# Patient Record
Sex: Female | Born: 1965 | Race: Black or African American | Hispanic: No | Smoking: Current some day smoker
Health system: Southern US, Community
[De-identification: ages and names within clinical notes are randomized; demographics above are authoritative.]

## PROBLEM LIST (undated history)

## (undated) DIAGNOSIS — K589 Irritable bowel syndrome without diarrhea: Secondary | ICD-10-CM

## (undated) DIAGNOSIS — G43909 Migraine, unspecified, not intractable, without status migrainosus: Secondary | ICD-10-CM

## (undated) DIAGNOSIS — F419 Anxiety disorder, unspecified: Secondary | ICD-10-CM

## (undated) DIAGNOSIS — G4730 Sleep apnea, unspecified: Secondary | ICD-10-CM

## (undated) DIAGNOSIS — K449 Diaphragmatic hernia without obstruction or gangrene: Secondary | ICD-10-CM

## (undated) DIAGNOSIS — R7303 Prediabetes: Secondary | ICD-10-CM

## (undated) DIAGNOSIS — K760 Fatty (change of) liver, not elsewhere classified: Secondary | ICD-10-CM

## (undated) DIAGNOSIS — R42 Dizziness and giddiness: Secondary | ICD-10-CM

## (undated) DIAGNOSIS — K219 Gastro-esophageal reflux disease without esophagitis: Secondary | ICD-10-CM

## (undated) DIAGNOSIS — M1990 Unspecified osteoarthritis, unspecified site: Secondary | ICD-10-CM

## (undated) DIAGNOSIS — J45909 Unspecified asthma, uncomplicated: Secondary | ICD-10-CM

## (undated) DIAGNOSIS — Z720 Tobacco use: Secondary | ICD-10-CM

## (undated) DIAGNOSIS — F329 Major depressive disorder, single episode, unspecified: Secondary | ICD-10-CM

## (undated) DIAGNOSIS — T7840XA Allergy, unspecified, initial encounter: Secondary | ICD-10-CM

## (undated) DIAGNOSIS — F32A Depression, unspecified: Secondary | ICD-10-CM

## (undated) DIAGNOSIS — E7800 Pure hypercholesterolemia, unspecified: Secondary | ICD-10-CM

## (undated) DIAGNOSIS — D219 Benign neoplasm of connective and other soft tissue, unspecified: Secondary | ICD-10-CM

## (undated) DIAGNOSIS — I10 Essential (primary) hypertension: Secondary | ICD-10-CM

## (undated) HISTORY — DX: Depression, unspecified: F32.A

## (undated) HISTORY — DX: Major depressive disorder, single episode, unspecified: F32.9

## (undated) HISTORY — DX: Fatty (change of) liver, not elsewhere classified: K76.0

## (undated) HISTORY — PX: FOOT SURGERY: SHX648

## (undated) HISTORY — DX: Tobacco use: Z72.0

## (undated) HISTORY — DX: Allergy, unspecified, initial encounter: T78.40XA

## (undated) HISTORY — DX: Sleep apnea, unspecified: G47.30

## (undated) HISTORY — PX: COLONOSCOPY: SHX174

## (undated) HISTORY — DX: Prediabetes: R73.03

## (undated) HISTORY — DX: Migraine, unspecified, not intractable, without status migrainosus: G43.909

## (undated) HISTORY — DX: Dizziness and giddiness: R42

## (undated) HISTORY — DX: Unspecified osteoarthritis, unspecified site: M19.90

## (undated) HISTORY — DX: Diaphragmatic hernia without obstruction or gangrene: K44.9

---

## 1997-10-12 ENCOUNTER — Other Ambulatory Visit: Admission: RE | Admit: 1997-10-12 | Discharge: 1997-10-12 | Payer: Self-pay | Admitting: Obstetrics and Gynecology

## 1999-02-21 ENCOUNTER — Other Ambulatory Visit: Admission: RE | Admit: 1999-02-21 | Discharge: 1999-02-21 | Payer: Self-pay | Admitting: Obstetrics and Gynecology

## 2000-06-07 ENCOUNTER — Encounter: Payer: Self-pay | Admitting: Family Medicine

## 2000-06-07 ENCOUNTER — Encounter: Admission: RE | Admit: 2000-06-07 | Discharge: 2000-06-07 | Payer: Self-pay | Admitting: Family Medicine

## 2000-08-12 ENCOUNTER — Other Ambulatory Visit: Admission: RE | Admit: 2000-08-12 | Discharge: 2000-08-12 | Payer: Self-pay | Admitting: Obstetrics and Gynecology

## 2001-05-15 ENCOUNTER — Encounter: Admission: RE | Admit: 2001-05-15 | Discharge: 2001-05-15 | Payer: Self-pay | Admitting: Family Medicine

## 2001-05-15 ENCOUNTER — Encounter: Payer: Self-pay | Admitting: Family Medicine

## 2001-07-01 ENCOUNTER — Emergency Department (HOSPITAL_COMMUNITY): Admission: EM | Admit: 2001-07-01 | Discharge: 2001-07-01 | Payer: Self-pay | Admitting: Emergency Medicine

## 2002-02-03 ENCOUNTER — Other Ambulatory Visit: Admission: RE | Admit: 2002-02-03 | Discharge: 2002-02-03 | Payer: Self-pay | Admitting: Obstetrics and Gynecology

## 2003-08-04 ENCOUNTER — Other Ambulatory Visit: Admission: RE | Admit: 2003-08-04 | Discharge: 2003-08-04 | Payer: Self-pay | Admitting: Obstetrics and Gynecology

## 2003-12-09 ENCOUNTER — Encounter (INDEPENDENT_AMBULATORY_CARE_PROVIDER_SITE_OTHER): Payer: Self-pay | Admitting: *Deleted

## 2003-12-09 ENCOUNTER — Ambulatory Visit (HOSPITAL_COMMUNITY): Admission: RE | Admit: 2003-12-09 | Discharge: 2003-12-09 | Payer: Self-pay | Admitting: Obstetrics & Gynecology

## 2004-06-19 ENCOUNTER — Emergency Department (HOSPITAL_COMMUNITY): Admission: EM | Admit: 2004-06-19 | Discharge: 2004-06-19 | Payer: Self-pay | Admitting: Family Medicine

## 2004-06-22 ENCOUNTER — Encounter: Admission: RE | Admit: 2004-06-22 | Discharge: 2004-06-22 | Payer: Self-pay | Admitting: Obstetrics & Gynecology

## 2004-09-05 ENCOUNTER — Emergency Department (HOSPITAL_COMMUNITY): Admission: EM | Admit: 2004-09-05 | Discharge: 2004-09-06 | Payer: Self-pay | Admitting: Emergency Medicine

## 2005-01-04 ENCOUNTER — Other Ambulatory Visit: Admission: RE | Admit: 2005-01-04 | Discharge: 2005-01-04 | Payer: Self-pay | Admitting: Obstetrics and Gynecology

## 2006-05-20 ENCOUNTER — Encounter: Admission: RE | Admit: 2006-05-20 | Discharge: 2006-05-20 | Payer: Self-pay | Admitting: Family Medicine

## 2006-05-20 ENCOUNTER — Other Ambulatory Visit: Admission: RE | Admit: 2006-05-20 | Discharge: 2006-05-20 | Payer: Self-pay | Admitting: Gynecology

## 2007-05-23 ENCOUNTER — Other Ambulatory Visit: Admission: RE | Admit: 2007-05-23 | Discharge: 2007-05-23 | Payer: Self-pay | Admitting: Gynecology

## 2007-10-14 ENCOUNTER — Encounter: Admission: RE | Admit: 2007-10-14 | Discharge: 2007-10-14 | Payer: Self-pay | Admitting: Family Medicine

## 2008-04-22 ENCOUNTER — Encounter: Payer: Self-pay | Admitting: Physician Assistant

## 2008-04-22 ENCOUNTER — Ambulatory Visit: Payer: Self-pay | Admitting: Women's Health

## 2008-04-22 ENCOUNTER — Encounter: Payer: Self-pay | Admitting: Women's Health

## 2008-04-22 ENCOUNTER — Other Ambulatory Visit: Admission: RE | Admit: 2008-04-22 | Discharge: 2008-04-22 | Payer: Self-pay | Admitting: Gynecology

## 2008-09-07 LAB — CONVERTED CEMR LAB

## 2008-09-21 ENCOUNTER — Emergency Department (HOSPITAL_COMMUNITY): Admission: EM | Admit: 2008-09-21 | Discharge: 2008-09-21 | Payer: Self-pay | Admitting: Emergency Medicine

## 2008-10-18 ENCOUNTER — Emergency Department (HOSPITAL_COMMUNITY): Admission: EM | Admit: 2008-10-18 | Discharge: 2008-10-18 | Payer: Self-pay | Admitting: Family Medicine

## 2009-01-06 ENCOUNTER — Encounter: Payer: Self-pay | Admitting: Physician Assistant

## 2009-01-17 ENCOUNTER — Ambulatory Visit: Payer: Self-pay | Admitting: Physician Assistant

## 2009-01-17 DIAGNOSIS — A6000 Herpesviral infection of urogenital system, unspecified: Secondary | ICD-10-CM | POA: Insufficient documentation

## 2009-01-17 DIAGNOSIS — B373 Candidiasis of vulva and vagina: Secondary | ICD-10-CM | POA: Insufficient documentation

## 2009-01-17 DIAGNOSIS — K589 Irritable bowel syndrome without diarrhea: Secondary | ICD-10-CM | POA: Insufficient documentation

## 2009-01-17 DIAGNOSIS — G43909 Migraine, unspecified, not intractable, without status migrainosus: Secondary | ICD-10-CM | POA: Insufficient documentation

## 2009-01-17 DIAGNOSIS — I10 Essential (primary) hypertension: Secondary | ICD-10-CM | POA: Insufficient documentation

## 2009-01-17 DIAGNOSIS — K219 Gastro-esophageal reflux disease without esophagitis: Secondary | ICD-10-CM | POA: Insufficient documentation

## 2009-01-17 DIAGNOSIS — F411 Generalized anxiety disorder: Secondary | ICD-10-CM | POA: Insufficient documentation

## 2009-01-17 DIAGNOSIS — L84 Corns and callosities: Secondary | ICD-10-CM | POA: Insufficient documentation

## 2009-01-17 DIAGNOSIS — K449 Diaphragmatic hernia without obstruction or gangrene: Secondary | ICD-10-CM | POA: Insufficient documentation

## 2009-01-17 DIAGNOSIS — R011 Cardiac murmur, unspecified: Secondary | ICD-10-CM | POA: Insufficient documentation

## 2009-01-17 LAB — CONVERTED CEMR LAB
ALT: 26 units/L (ref 0–35)
Alkaline Phosphatase: 55 units/L (ref 39–117)
BUN: 14 mg/dL (ref 6–23)
Eosinophils Absolute: 0.1 10*3/uL (ref 0.0–0.7)
Eosinophils Relative: 1 % (ref 0–5)
Glucose, Bld: 92 mg/dL (ref 70–99)
Glucose, Urine, Semiquant: NEGATIVE
Hemoglobin: 12.5 g/dL (ref 12.0–15.0)
Lymphocytes Relative: 47 % — ABNORMAL HIGH (ref 12–46)
Lymphs Abs: 4.1 10*3/uL — ABNORMAL HIGH (ref 0.7–4.0)
MCHC: 32.8 g/dL (ref 30.0–36.0)
MCV: 83.9 fL (ref 78.0–100.0)
Monocytes Relative: 6 % (ref 3–12)
Neutro Abs: 3.9 10*3/uL (ref 1.7–7.7)
Neutrophils Relative %: 45 % (ref 43–77)
Nitrite: NEGATIVE
Platelets: 336 10*3/uL (ref 150–400)
Protein, U semiquant: NEGATIVE
Rapid HIV Screen: NEGATIVE
Sodium: 142 meq/L (ref 135–145)
Specific Gravity, Urine: >=1.03
Total Protein: 7.1 g/dL (ref 6.0–8.3)
Urobilinogen, UA: 0.2
WBC Urine, dipstick: NEGATIVE
WBC: 8.7 10*3/uL (ref 4.0–10.5)
pH: 6

## 2009-01-18 ENCOUNTER — Encounter: Payer: Self-pay | Admitting: Physician Assistant

## 2009-01-20 ENCOUNTER — Ambulatory Visit: Payer: Self-pay | Admitting: *Deleted

## 2009-01-26 ENCOUNTER — Encounter: Payer: Self-pay | Admitting: Physician Assistant

## 2009-02-02 ENCOUNTER — Telehealth: Payer: Self-pay | Admitting: Physician Assistant

## 2009-02-11 ENCOUNTER — Telehealth: Payer: Self-pay | Admitting: Physician Assistant

## 2009-02-11 ENCOUNTER — Encounter: Payer: Self-pay | Admitting: Physician Assistant

## 2009-02-11 DIAGNOSIS — J45909 Unspecified asthma, uncomplicated: Secondary | ICD-10-CM | POA: Insufficient documentation

## 2009-02-16 ENCOUNTER — Encounter: Payer: Self-pay | Admitting: Physician Assistant

## 2009-02-16 ENCOUNTER — Telehealth: Payer: Self-pay | Admitting: Physician Assistant

## 2009-02-21 ENCOUNTER — Telehealth (INDEPENDENT_AMBULATORY_CARE_PROVIDER_SITE_OTHER): Payer: Self-pay | Admitting: *Deleted

## 2009-03-01 ENCOUNTER — Encounter (INDEPENDENT_AMBULATORY_CARE_PROVIDER_SITE_OTHER): Payer: Self-pay | Admitting: Nurse Practitioner

## 2009-03-02 ENCOUNTER — Ambulatory Visit: Payer: Self-pay | Admitting: Internal Medicine

## 2009-03-10 ENCOUNTER — Encounter (INDEPENDENT_AMBULATORY_CARE_PROVIDER_SITE_OTHER): Payer: Self-pay | Admitting: Nurse Practitioner

## 2009-03-10 ENCOUNTER — Telehealth: Payer: Self-pay | Admitting: Physician Assistant

## 2009-03-10 ENCOUNTER — Ambulatory Visit: Payer: Self-pay | Admitting: Physician Assistant

## 2009-03-10 DIAGNOSIS — J309 Allergic rhinitis, unspecified: Secondary | ICD-10-CM | POA: Insufficient documentation

## 2009-03-10 DIAGNOSIS — H11449 Conjunctival cysts, unspecified eye: Secondary | ICD-10-CM | POA: Insufficient documentation

## 2009-03-10 DIAGNOSIS — R82998 Other abnormal findings in urine: Secondary | ICD-10-CM | POA: Insufficient documentation

## 2009-03-10 LAB — CONVERTED CEMR LAB
Blood Glucose, Fingerstick: 104
Glucose, Urine, Semiquant: NEGATIVE
Hgb A1c MFr Bld: 5.9 %
Ketones, urine, test strip: NEGATIVE
Nitrite: NEGATIVE
Protein, U semiquant: 30
Specific Gravity, Urine: >=1.03
Urobilinogen, UA: 0.2
WBC Urine, dipstick: NEGATIVE
pH: 6

## 2009-03-11 ENCOUNTER — Encounter: Payer: Self-pay | Admitting: Physician Assistant

## 2009-03-18 ENCOUNTER — Encounter (INDEPENDENT_AMBULATORY_CARE_PROVIDER_SITE_OTHER): Payer: Self-pay | Admitting: *Deleted

## 2009-03-23 ENCOUNTER — Ambulatory Visit: Payer: Self-pay | Admitting: Physician Assistant

## 2009-03-23 ENCOUNTER — Encounter (INDEPENDENT_AMBULATORY_CARE_PROVIDER_SITE_OTHER): Payer: Self-pay | Admitting: Nurse Practitioner

## 2009-03-23 DIAGNOSIS — M722 Plantar fascial fibromatosis: Secondary | ICD-10-CM | POA: Insufficient documentation

## 2009-03-24 ENCOUNTER — Encounter (INDEPENDENT_AMBULATORY_CARE_PROVIDER_SITE_OTHER): Payer: Self-pay | Admitting: Nurse Practitioner

## 2009-03-31 ENCOUNTER — Telehealth: Payer: Self-pay | Admitting: Physician Assistant

## 2009-04-27 ENCOUNTER — Telehealth (INDEPENDENT_AMBULATORY_CARE_PROVIDER_SITE_OTHER): Payer: Self-pay | Admitting: Nurse Practitioner

## 2009-05-06 ENCOUNTER — Ambulatory Visit: Payer: Self-pay | Admitting: Nurse Practitioner

## 2009-05-06 DIAGNOSIS — B9689 Other specified bacterial agents as the cause of diseases classified elsewhere: Secondary | ICD-10-CM | POA: Insufficient documentation

## 2009-05-06 DIAGNOSIS — J019 Acute sinusitis, unspecified: Secondary | ICD-10-CM

## 2009-05-30 ENCOUNTER — Encounter (INDEPENDENT_AMBULATORY_CARE_PROVIDER_SITE_OTHER): Payer: Self-pay | Admitting: Nurse Practitioner

## 2009-05-30 ENCOUNTER — Ambulatory Visit (HOSPITAL_COMMUNITY): Admission: RE | Admit: 2009-05-30 | Discharge: 2009-05-30 | Payer: Self-pay | Admitting: Family Medicine

## 2009-06-03 ENCOUNTER — Telehealth (INDEPENDENT_AMBULATORY_CARE_PROVIDER_SITE_OTHER): Payer: Self-pay | Admitting: Nurse Practitioner

## 2009-06-05 ENCOUNTER — Telehealth: Payer: Self-pay | Admitting: Physician Assistant

## 2009-06-05 ENCOUNTER — Encounter: Payer: Self-pay | Admitting: Physician Assistant

## 2009-06-06 ENCOUNTER — Encounter (INDEPENDENT_AMBULATORY_CARE_PROVIDER_SITE_OTHER): Payer: Self-pay | Admitting: Nurse Practitioner

## 2009-06-06 ENCOUNTER — Encounter: Admission: RE | Admit: 2009-06-06 | Discharge: 2009-06-06 | Payer: Self-pay | Admitting: Family Medicine

## 2009-06-08 ENCOUNTER — Ambulatory Visit: Payer: Self-pay | Admitting: Nurse Practitioner

## 2009-06-09 ENCOUNTER — Encounter (INDEPENDENT_AMBULATORY_CARE_PROVIDER_SITE_OTHER): Payer: Self-pay | Admitting: Nurse Practitioner

## 2009-06-09 ENCOUNTER — Encounter: Payer: Self-pay | Admitting: Physician Assistant

## 2009-06-15 ENCOUNTER — Ambulatory Visit: Payer: Self-pay | Admitting: Family Medicine

## 2009-06-15 ENCOUNTER — Ambulatory Visit: Payer: Self-pay | Admitting: Nurse Practitioner

## 2009-06-15 DIAGNOSIS — N6009 Solitary cyst of unspecified breast: Secondary | ICD-10-CM | POA: Insufficient documentation

## 2009-06-15 LAB — CONVERTED CEMR LAB: Preg, Serum: NEGATIVE

## 2009-06-16 ENCOUNTER — Ambulatory Visit: Payer: Self-pay | Admitting: Nurse Practitioner

## 2009-06-22 ENCOUNTER — Encounter (INDEPENDENT_AMBULATORY_CARE_PROVIDER_SITE_OTHER): Payer: Self-pay | Admitting: Nurse Practitioner

## 2009-06-24 ENCOUNTER — Ambulatory Visit: Payer: Self-pay | Admitting: Nurse Practitioner

## 2009-06-24 DIAGNOSIS — R3129 Other microscopic hematuria: Secondary | ICD-10-CM | POA: Insufficient documentation

## 2009-06-24 DIAGNOSIS — L0292 Furuncle, unspecified: Secondary | ICD-10-CM | POA: Insufficient documentation

## 2009-06-24 DIAGNOSIS — L0293 Carbuncle, unspecified: Secondary | ICD-10-CM

## 2009-06-24 DIAGNOSIS — N760 Acute vaginitis: Secondary | ICD-10-CM | POA: Insufficient documentation

## 2009-06-24 LAB — CONVERTED CEMR LAB
Chlamydia, DNA Probe: NEGATIVE
GC Probe Amp, Genital: NEGATIVE
Glucose, Urine, Semiquant: NEGATIVE
Nitrite: NEGATIVE
OCCULT 1: NEGATIVE
Urobilinogen, UA: 0.2
WBC Urine, dipstick: NEGATIVE
pH: 8

## 2009-06-25 ENCOUNTER — Encounter (INDEPENDENT_AMBULATORY_CARE_PROVIDER_SITE_OTHER): Payer: Self-pay | Admitting: Nurse Practitioner

## 2009-06-28 DIAGNOSIS — F331 Major depressive disorder, recurrent, moderate: Secondary | ICD-10-CM | POA: Insufficient documentation

## 2009-06-30 ENCOUNTER — Encounter (INDEPENDENT_AMBULATORY_CARE_PROVIDER_SITE_OTHER): Payer: Self-pay | Admitting: Nurse Practitioner

## 2009-08-09 ENCOUNTER — Encounter (INDEPENDENT_AMBULATORY_CARE_PROVIDER_SITE_OTHER): Payer: Self-pay | Admitting: Nurse Practitioner

## 2009-08-11 ENCOUNTER — Telehealth (INDEPENDENT_AMBULATORY_CARE_PROVIDER_SITE_OTHER): Payer: Self-pay | Admitting: Nurse Practitioner

## 2009-08-19 ENCOUNTER — Telehealth (INDEPENDENT_AMBULATORY_CARE_PROVIDER_SITE_OTHER): Payer: Self-pay | Admitting: Nurse Practitioner

## 2009-09-07 ENCOUNTER — Ambulatory Visit: Payer: Self-pay | Admitting: Nurse Practitioner

## 2009-09-12 ENCOUNTER — Telehealth (INDEPENDENT_AMBULATORY_CARE_PROVIDER_SITE_OTHER): Payer: Self-pay | Admitting: Nurse Practitioner

## 2009-09-14 ENCOUNTER — Emergency Department (HOSPITAL_COMMUNITY): Admission: EM | Admit: 2009-09-14 | Discharge: 2009-09-14 | Payer: Self-pay | Admitting: Family Medicine

## 2009-10-01 ENCOUNTER — Encounter (INDEPENDENT_AMBULATORY_CARE_PROVIDER_SITE_OTHER): Payer: Self-pay | Admitting: Nurse Practitioner

## 2009-10-04 ENCOUNTER — Telehealth (INDEPENDENT_AMBULATORY_CARE_PROVIDER_SITE_OTHER): Payer: Self-pay | Admitting: Nurse Practitioner

## 2009-10-05 ENCOUNTER — Ambulatory Visit: Payer: Self-pay | Admitting: Nurse Practitioner

## 2009-10-12 ENCOUNTER — Ambulatory Visit: Payer: Self-pay | Admitting: Nurse Practitioner

## 2009-10-25 ENCOUNTER — Telehealth (INDEPENDENT_AMBULATORY_CARE_PROVIDER_SITE_OTHER): Payer: Self-pay | Admitting: Nurse Practitioner

## 2009-11-16 ENCOUNTER — Telehealth (INDEPENDENT_AMBULATORY_CARE_PROVIDER_SITE_OTHER): Payer: Self-pay | Admitting: Nurse Practitioner

## 2009-11-29 ENCOUNTER — Ambulatory Visit: Payer: Self-pay | Admitting: Nurse Practitioner

## 2009-12-20 ENCOUNTER — Telehealth (INDEPENDENT_AMBULATORY_CARE_PROVIDER_SITE_OTHER): Payer: Self-pay | Admitting: Nurse Practitioner

## 2010-02-06 ENCOUNTER — Telehealth (INDEPENDENT_AMBULATORY_CARE_PROVIDER_SITE_OTHER): Payer: Self-pay | Admitting: Nurse Practitioner

## 2010-02-09 ENCOUNTER — Ambulatory Visit: Payer: Self-pay | Admitting: Nurse Practitioner

## 2010-04-06 ENCOUNTER — Telehealth (INDEPENDENT_AMBULATORY_CARE_PROVIDER_SITE_OTHER): Payer: Self-pay | Admitting: Nurse Practitioner

## 2010-04-11 ENCOUNTER — Ambulatory Visit: Admit: 2010-04-11 | Payer: Self-pay | Admitting: Nurse Practitioner

## 2010-04-11 ENCOUNTER — Ambulatory Visit: Admission: RE | Admit: 2010-04-11 | Payer: Self-pay | Source: Home / Self Care | Admitting: Nurse Practitioner

## 2010-04-14 ENCOUNTER — Ambulatory Visit
Admission: RE | Admit: 2010-04-14 | Discharge: 2010-04-14 | Payer: Self-pay | Source: Home / Self Care | Attending: Nurse Practitioner | Admitting: Nurse Practitioner

## 2010-04-30 ENCOUNTER — Encounter: Payer: Self-pay | Admitting: Family Medicine

## 2010-05-03 ENCOUNTER — Ambulatory Visit
Admission: RE | Admit: 2010-05-03 | Discharge: 2010-05-03 | Payer: Self-pay | Source: Home / Self Care | Attending: Nurse Practitioner | Admitting: Nurse Practitioner

## 2010-05-04 ENCOUNTER — Other Ambulatory Visit (HOSPITAL_BASED_OUTPATIENT_CLINIC_OR_DEPARTMENT_OTHER): Payer: Self-pay | Admitting: Nurse Practitioner

## 2010-05-04 ENCOUNTER — Telehealth (INDEPENDENT_AMBULATORY_CARE_PROVIDER_SITE_OTHER): Payer: Self-pay | Admitting: Nurse Practitioner

## 2010-05-04 DIAGNOSIS — Z1231 Encounter for screening mammogram for malignant neoplasm of breast: Secondary | ICD-10-CM

## 2010-05-04 DIAGNOSIS — Z139 Encounter for screening, unspecified: Secondary | ICD-10-CM

## 2010-05-09 NOTE — Assessment & Plan Note (Signed)
Summary: Complete Physical Exam   Vital Signs:  Patient profile:   45 year old female Menstrual status:  depo Weight:      192.8 pounds BMI:     32.20 BSA:     1.95 Temp:     98.2 degrees F oral Pulse rate:   72 / minute Pulse rhythm:   regular Resp:     16 per minute BP sitting:   120 / 87  (left arm) Cuff size:   regular  Vitals Entered By: Levon Hedger (June 24, 2009 2:11 PM) CC: CPP Is Patient Diabetic? No Pain Assessment Patient in pain? no       Does patient need assistance? Functional Status Self care Ambulation Normal   CC:  CPP.  History of Present Illness:  Pt into the office for a complete physical  PAP - last done 1 year ago at [facility] Currently on depoprovera which she just received in this office last week Divorced 3 years ago No current sexual relationship  Mammogram - Done on 06/06/2009 no family history of breast cancer no self breast exams at home  Optho - wears glasses at night for near sighted vision. Maintains regular f/u at optho  Dental - upper partial, no recent dental exam  tdap - has been in the last 7 years, done at [facility]. pt to call back with the exact date  Habits & Providers  Alcohol-Tobacco-Diet     Alcohol drinks/day: <1     Tobacco Status: current     Cigarette Packs/Day: 1.0     Year Started: 1995     Pack years: 15+  Exercise-Depression-Behavior     Does Patient Exercise: yes     Exercise Counseling: to improve exercise regimen     Have you felt down or hopeless? no     Have you felt little pleasure in things? no     Depression Counseling: further diagnostic testing and/or other treatment is indicated     Drug Use: no  Comments: Pt was continued on last visit on lexapro and is doing much better PHQ-9 score = 14  Allergies (verified): 1)  ! Pcn  Review of Systems General:  Denies fever. Eyes:  Complains of blurring. ENT:  Complains of sinus pressure; denies earache. CV:  Denies chest pain or  discomfort. Resp:  Denies cough. GI:  Denies abdominal pain, nausea, and vomiting. GU:  Denies discharge. MS:  Denies joint pain. Derm:  Denies rash. Neuro:  Denies headaches. Psych:  Denies depression; feeling much better on lexapro. Allergy:  Complains of sneezing.  Physical Exam  General:  alert.   Head:  normocephalic.   Eyes:  pupils equal and pupils round.   Ears:  bil TM with bony landmarks present minimal cerumen Nose:  no nasal discharge.   Mouth:  pharynx pink and moist and fair dentition.   Neck:  supple.   Chest Wall:  no mass.   Breasts:  skin/areolae normal, no masses, and no abnormal thickening.   Lungs:  normal breath sounds.   Heart:  normal rate and regular rhythm.   Abdomen:  soft, non-tender, and normal bowel sounds.   Rectal:  no external abnormalities.   Msk:  normal ROM.   Extremities:  no edema Neurologic:  alert & oriented X3.   Skin:  left hip lipoma 3cm x 2cm bil inner thighs with scarring from previous boils Psych:  Oriented X3.    Pelvic Exam  Vulva:      normal appearance.  Urethra and Bladder:      Urethra--no discharge.   Vagina:      copious discharge.   Cervix:      midposition.   Uterus:      smooth.   Adnexa:      nontender bilaterally.   Rectum:      normal, heme negative stool.      Impression & Recommendations:  Problem # 1:  ROUTINE GYNECOLOGICAL EXAMINATION (ICD-V72.31)  labs up to date guaiac negative mammogram done 06/06/2009 PHQ-9 score = 14 EKG done Tdap - ? given 7 years ago. pt to check the date Orders: Hemoccult Guaiac-1 spec.(in office) (82270) KOH/ WET Mount [phone]) Pap Smear, Thin Prep ( Collection of) (E8315) UA Dipstick w/o Micro (manual) (17616) T- GC Chlamydia (07371)  Problem # 2:  HYPERTENSION (ICD-401.9) DASH diet continue current meds Her updated medication list for this problem includes:    Lisinopril-hydrochlorothiazide 20-25 Mg Tabs (Lisinopril-hydrochlorothiazide) ..... One tablet  by mouth daily  Problem # 3:  DEPRESSION (ICD-311) stable on current medications Her updated medication list for this problem includes:    Alprazolam 0.25 Mg Tabs (Alprazolam) ..... One tablet by mouth daily as needed for anxiety    Lexapro 10 Mg Tabs (Escitalopram oxalate) ..... One tablet by mouth daily for mood    Trazodone Hcl 50 Mg Tabs (Trazodone hcl) .Marland Kitchen... 1-2 tablets by mouth nightly for sleep  Problem # 4:  ALLERGIC RHINITIS (ICD-477.9)  Her updated medication list for this problem includes:    Allegra 180 Mg Tabs (Fexofenadine hcl) .Marland Kitchen... Take 1 tablet by mouth once a day    Flonase 50 Mcg/act Susp (Fluticasone propionate) .Marland Kitchen... 1-2 sprays each nostril once daily  Complete Medication List: 1)  Alprazolam 0.25 Mg Tabs (Alprazolam) .... One tablet by mouth daily as needed for anxiety 2)  Lisinopril-hydrochlorothiazide 20-25 Mg Tabs (Lisinopril-hydrochlorothiazide) .... One tablet by mouth daily 3)  Valtrex 500 Mg Tabs (Valacyclovir hcl) .... Take 1 tablet by mouth every 12 hours for 3 days for outbreaks 4)  Imitrex 50 Mg Tabs (Sumatriptan succinate) .... Take 1 by mouth at start of headache.  may repeat in 2 hours if headache recurs.  no more than 2 in 24 hour period. 5)  Multivitamins Tabs (Multiple vitamin) .... Once daily 6)  Caltrate 600+d 600-400 Mg-unit Tabs (Calcium carbonate-vitamin d) .... Once daily 7)  Protonix 40 Mg Tbec (Pantoprazole sodium) .... Take 1 tablet by mouth once a day 8)  Allegra 180 Mg Tabs (Fexofenadine hcl) .... Take 1 tablet by mouth once a day 9)  Flonase 50 Mcg/act Susp (Fluticasone propionate) .Marland Kitchen.. 1-2 sprays each nostril once daily 10)  Lexapro 10 Mg Tabs (Escitalopram oxalate) .... One tablet by mouth daily for mood 11)  Trazodone Hcl 50 Mg Tabs (Trazodone hcl) .Marland Kitchen.. 1-2 tablets by mouth nightly for sleep 12)  Amitiza 8 Mcg Caps (Lubiprostone) .... One capsule by mouth two times a day 13)  Cephalexin 500 Mg Caps (Cephalexin) .... One capsule by  mouth three times a day 14)  Metrogel-vaginal 0.75 % Gel (Metronidazole) .... Apply intravaginally at night for 5 nights  Other Orders: EKG w/ Interpretation (93000) T-Culture, Urine ([id number])  Patient Instructions: 1)  Sinus irritation - continue allegra and nasal spray 2)  If you can get and use a netti pot to "wash" the sinuses that would be of some benefit 3)  EKG is good 4)  Your urine will be sent for culture.  You will be notified of the PAP  smear results 5)  Follow up in 3 months or sooner if needed   Rx for z-pack not given to pt Prescriptions: METROGEL-VAGINAL 0.75 % GEL (METRONIDAZOLE) Apply intravaginally at night for 5 nights  #45gm x 0   Entered and Authorized by:   Lehman Prom FNP   Signed by:   Lehman Prom FNP on 06/24/2009   Method used:   Print then Give to Patient   RxID:   [id number] CEPHALEXIN 500 MG CAPS (CEPHALEXIN) One capsule by mouth three times a day  #21 x 0   Entered and Authorized by:   Lehman Prom FNP   Signed by:   Lehman Prom FNP on 06/24/2009   Method used:   Print then Give to Patient   RxID:   [id number] QIONGEXBM Z-PAK 250 MG TABS (AZITHROMYCIN) Use as directed  #6 tabs x 0   Entered and Authorized by:   Lehman Prom FNP   Signed by:   Lehman Prom FNP on 06/24/2009   Method used:   Print then Give to Patient   RxID:   [id number]   Laboratory Results   Urine Tests  Date/Time Received: June 24, 2009 2:22 PM   Routine Urinalysis   Color: lt. yellow Appearance: Clear Glucose: negative   (Normal Range: Negative) Bilirubin: negative   (Normal Range: Negative) Ketone: negative   (Normal Range: Negative) Spec. Gravity: 1.020   (Normal Range: 1.003-1.035) Blood: trace-lysed   (Normal Range: Negative) pH: 8.0   (Normal Range: 5.0-8.0) Protein: negative   (Normal Range: Negative) Urobilinogen: 0.2   (Normal Range: 0-1) Nitrite: negative   (Normal Range: Negative) Leukocyte Esterace: negative    (Normal Range: Negative)      Wet Mount/KOH Source: vaginal WBC/hpf: 1-5 Bacteria/hpf: rare Clue cells/hpf: moderate Yeast/hpf: none Trichomonas/hpf: none  Stool - Occult Blood Hemmoccult #1: negative Date: 06/24/2009    Laboratory Results   Urine Tests    Routine Urinalysis   Color: lt. yellow Appearance: Clear Glucose: negative   (Normal Range: Negative) Bilirubin: negative   (Normal Range: Negative) Ketone: negative   (Normal Range: Negative) Spec. Gravity: 1.020   (Normal Range: 1.003-1.035) Blood: trace-lysed   (Normal Range: Negative) pH: 8.0   (Normal Range: 5.0-8.0) Protein: negative   (Normal Range: Negative) Urobilinogen: 0.2   (Normal Range: 0-1) Nitrite: negative   (Normal Range: Negative) Leukocyte Esterace: negative   (Normal Range: Negative)      Wet Mount Wet Mount KOH: Negative  Stool - Occult Blood Hemmoccult #1: negative

## 2010-05-09 NOTE — Assessment & Plan Note (Signed)
Summary: DEPO//TRIAGE//KT  Nurse Visit   Vital Signs:  Patient profile:   45 year old female Menstrual status:  depo Temp:     97.9 degrees F oral Pulse rate:   84 / minute Pulse rhythm:   regular Resp:     20 per minute BP sitting:   118 / 78  (right arm) Cuff size:   regular   Patient Instructions: 1)  The skin carries bacteria.  It is the first line of defense against infection. Every skin lesion does NOT need antibiotics.  This will lead to resistance and decrease in effectiveness during times of serious illness. 2)  If areas improved on their own with time and care then this shows you that antibiotics are not needed in EVERY case.  There are some exceptions. 3)  Providers generally make the decision as to if antibiotics are needed this is the reason antiobics were not called in for you. 4)  Be aware that dark skin folds such as underarms, buttocks, under breast because they are high sweat areas tend to make bacteria be more prominent. 5)  Some things you can do to prevent: use antibacterial soap, use betasept (this can be purchased over the counter - apply for 5 minutes, later and let sit) 6)  Also apply warm heat to affected area 7)  Next Depoprovera injection is May 03, 2010   CC:  Has boils in several areas.  History of Present Illness: Has had boils to pantyline, pelvic area and buttocks folds.  Wants them assessed for possible antibiotic usage.  Has been keeping area clean and dry, started about 10 days ago, have since gone away, started with burning and itching.  States they reoccur without warning.   Review of Systems Derm:  Complains of changes in color of skin; has had several boils, now healed.  Gets them sporadically, takes antibiotics when she feels them coming on..   Physical Exam  Skin:  Several raised, dark areas to groin fold, between thighs, no drainage, not painful to palpation, intact.  CC: Has boils in several areas Is Patient Diabetic? No Pain  Assessment Patient in pain? no       Does patient need assistance? Functional Status Self care Ambulation Normal   Allergies: 1)  ! Pcn  Immunizations Administered:  Influenza Vaccine # 1:    Vaccine Type: Fluvax 3+    Site: left deltoid    Mfr: [employer]axoSmithK[employer]    Dose: 0.5 ml    Route: IM    Given by: Dutch Quint RN    Exp. Date: 10/07/2010    Lot #: [id number]    VIS given: 11/01/09 version given February 09, 2010.  Flu Vaccine Consent Questions:    Do you have a history of severe allergic reactions to this vaccine? no    Any prior history of allergic reactions to egg and/or gelatin? no    Do you have a sensitivity to the preservative Thimersol? no    Do you have a past history of Guillan-Barre Syndrome? no    Do you currently have an acute febrile illness? no    Have you ever had a severe reaction to latex? no    Vaccine information given and explained to patient? yes    Are you currently pregnant? no  Orders Added: 1)  Flu Vaccine 23yrs + [90658] 2)  Admin 1st Vaccine [90471] 3)  Admin of patients own med IM/SQ [96372M] 4)  Est. Patient Level I [09811]

## 2010-05-09 NOTE — Letter (Signed)
Summary: *HSN Results Follow up  [facility]  [address] [place], [place] [address]   Phone: [phone]  Fax: [phone]      06/30/2009   [facility] [address] [place], [place]  [address]   Dear  Ms. Vanessia Mohar,                            ____S.Drinkard,FNP   ____D. Gore,FNP       ____B. McPherson,MD   ____V. Rankins,MD    ____E. Mulberry,MD    _X___N. Daphine Deutscher, FNP  ____D. Reche Dixon, MD    ____K. Philipp Deputy, MD    ____Other     This letter is to inform you that your recent test(s):  ___X____Pap Smear    _______Lab Test     _______X-ray    ___X____ is within acceptable limits  _______ requires a medication change  _______ requires a follow-up lab visit  _______ requires a follow-up visit with your provider   Comments: Pap Smear was normal.       _________________________________________________________ If you have any questions, please contact our office [phone].                    Sincerely,    Lehman Prom FNP [facility]

## 2010-05-09 NOTE — Progress Notes (Signed)
Summary: DEPO SHOT PRESCRIPTION  Phone Note Call from Patient   Summary of Call: THE PT NEEDS TO COME FOR A DEPO SHOT ON AUG 23 AND SHE NEEDS SOMEONE CALL IN TO [facility] SO SHE CAN GET THE PRESCRIPTION. MARTIN FNP  Initial call taken by: Manon Hilding,  November 16, 2009 10:15 AM  Follow-up for Phone Call        forward to N. Martin,fnp Follow-up by: Levon Hedger,  November 16, 2009 10:35 AM  Additional Follow-up for Phone Call Additional follow up Details #1::        Rx sent electronically to [facility] pharmacy advise pt to check there and get meds before coming into the office for injection Additional Follow-up by: Lehman Prom FNP,  November 16, 2009 11:23 AM    Additional Follow-up for Phone Call Additional follow up Details #2::    pt informed. Follow-up by: Levon Hedger,  November 16, 2009 12:42 PM  New/Updated Medications: DEPO-PROVERA 150 MG/ML SUSP (MEDROXYPROGESTERONE ACETATE) Injection once every 3 months for birth control Prescriptions: DEPO-PROVERA 150 MG/ML SUSP (MEDROXYPROGESTERONE ACETATE) Injection once every 3 months for birth control  #1 x 3   Entered and Authorized by:   Lehman Prom FNP   Signed by:   Lehman Prom FNP on 11/16/2009   Method used:   Faxed to ...       [facility]retail)       [address] [place], [place]  [address]       Ph: [phone]       Fax: [phone]   RxID:   [id number]

## 2010-05-09 NOTE — Miscellaneous (Signed)
Summary: Diag Mammo Normal  Clinical Lists Changes  Observations: Added new observation of MAMMRECACT: Screening mammogram in 1 year.    (06/06/2009 22:12) Added new observation of MAMMOGRAM: Diagnostic Right Mammogram and Right Breast Ultrasound due to abnormal screening mammogram:  No mammographic or sonographic evidence of malignancy. (06/06/2009 22:12)      Mammogram  Procedure date:  06/06/2009  Findings:      Diagnostic Right Mammogram and Right Breast Ultrasound due to abnormal screening mammogram:  No mammographic or sonographic evidence of malignancy.  Comments:      Screening mammogram in 1 year.

## 2010-05-09 NOTE — Assessment & Plan Note (Signed)
Summary: Acute - Sinusitis   Vital Signs:  Patient profile:   45 year old female Menstrual status:  depo Weight:      194.6 pounds BMI:     32.50 BSA:     1.96 Temp:     98.5 degrees F oral Pulse rate:   76 / minute Pulse rhythm:   regular Resp:     20 per minute BP sitting:   120 / 80  (left arm) Cuff size:   regular  Vitals Entered By: Levon Hedger (May 06, 2009 11:29 AM) CC: back pain x 1 week....sinus pressure, drainage down back of throat, phlem x1 1/2 week, Hypertension Management, Depression, Abdominal Pain Is Patient Diabetic? No Pain Assessment Patient in pain? yes     Location: back Intensity: 6 Onset of pain  With activity  Does patient need assistance? Functional Status Self care Ambulation Normal     Menstrual Status depo Last PAP Result  Specimen Adequacy: Satisfactory for evaluation.   Interpretation/Result:Negative for intraepithelial Lesion or Malignancy.   Location: [facility].     CC:  back pain x 1 week....sinus pressure, drainage down back of throat, phlem x1 1/2 week, Hypertension Management, Depression, and Abdominal Pain.  History of Present Illness:  Pt into the office with complaints of sinus pressure She has been taking some over the counter medication Slight headaches +heaviness in eyes +nasal congestion -cough   Depression History:      Positive alarm features for depression include psychomotor agitation and fatigue (loss of energy).        The patient denies that she feels like life is not worth living, denies that she wishes that she were dead, and denies that she has thought about ending her life.         Depression Treatment History:  Prior Medication Used:   Start Date: Assessment of Effect:   Comments:  lexapro     05/06/2009     --       started  Dyspepsia History:      There is a prior history of GERD.  The patient does not have a prior history of documented ulcer disease.  An H-2 blocker  medication is not currently being taken.  She has no history of a positive H. Pylori serology.  No previous upper endoscopy has been done.    Hypertension History:      She denies headache, chest pain, and palpitations.  She notes no problems with any antihypertensive medication side effects.        Positive major cardiovascular risk factors include hypertension and current tobacco user.  Negative major cardiovascular risk factors include female age less than 74 years old.      Allergies (verified): 1)  ! Pcn  Review of Systems General:  Denies loss of appetite. ENT:  Complains of nasal congestion and sinus pressure. CV:  Denies chest pain or discomfort. Resp:  Denies cough. MS:  Complains of low back pain; started 1 week ago accompaned with belching and abdominal bloating.  Took meds for GERD along with some OTC meds which helped some with symptoms.  Pain in back is intermittent.Marland Kitchen Psych:  Complains of anxiety, depression, and irritability; Remeron makes pt very sleepy.  She has tried to take it earlier in the afternoon but still makes her tired.  requesting to take something else. Reports she has been on may SSRI's in the past with no benefit.Marland Kitchen  Physical Exam  General:  alert.   Head:  normocephalic.   Lungs:  normal breath sounds.   Heart:  normal rate and regular rhythm.   Abdomen:  normal bowel sounds.   Neurologic:  alert & oriented X3.     Impression & Recommendations:  Problem # 1:  SINUSITIS, ACUTE (ICD-461.9) PCN allergy advised pt to continue allegra and flonase  Her updated medication list for this problem includes:    Flonase 50 Mcg/act Susp (Fluticasone propionate) .Marland Kitchen... 1-2 sprays each nostril once daily    Clarithromycin 500 Mg Tabs (Clarithromycin) ..... One tablet by mouth two times a day for infection  Problem # 2:  ANXIETY (ICD-300.00)  The following medications were removed from the medication list:    Remeron 15 Mg Tabs (Mirtazapine) .Marland Kitchen... Take 1 tab by  mouth at bedtime Her updated medication list for this problem includes:    Alprazolam 0.25 Mg Tabs (Alprazolam) .Marland Kitchen... Take 1/2 to 1 tablet three times a day as needed for anxiety    Lexapro 10 Mg Tabs (Escitalopram oxalate) ..... Start with 1/2 tablet by mouth nightly then increase to 1 tablet by mouth nightly  Problem # 3:  ESSENTIAL HYPERTENSION, BENIGN (ICD-401.1)  Her updated medication list for this problem includes:    Lisinopril-hydrochlorothiazide 20-25 Mg Tabs (Lisinopril-hydrochlorothiazide) ..... One tab in am  Complete Medication List: 1)  Alprazolam 0.25 Mg Tabs (Alprazolam) .... Take 1/2 to 1 tablet three times a day as needed for anxiety 2)  Metoclopramide Hcl 5 Mg Tabs (Metoclopramide hcl) .... One tab by mouth two times a day 3)  Ondansetron Hcl 4 Mg Tabs (Ondansetron hcl) .... Take one tab by mouth every 6 to 8 hours 4)  Lisinopril-hydrochlorothiazide 20-25 Mg Tabs (Lisinopril-hydrochlorothiazide) .... One tab in am 5)  Valtrex 500 Mg Tabs (Valacyclovir hcl) .... Take 1 tablet by mouth every 12 hours for 3 days for outbreaks 6)  Hyoscyamine Sulfate 0.125 Mg/ml Soln (Hyoscyamine sulfate) .... One tab under tongue three times a day as needed 7)  Imitrex 50 Mg Tabs (Sumatriptan succinate) .... Take 1 by mouth at start of headache.  may repeat in 2 hours if headache recurs.  no more than 2 in 24 hour period. 8)  Multivitamins Tabs (Multiple vitamin) .... Once daily 9)  Caltrate 600+d 600-400 Mg-unit Tabs (Calcium carbonate-vitamin d) .... Once daily 10)  Protonix 40 Mg Tbec (Pantoprazole sodium) .... Take 1 tablet by mouth once a day 11)  Diflucan 150 Mg Tabs (Fluconazole) .... Take 1 by mouth by mouth x 1 12)  Allegra 180 Mg Tabs (Fexofenadine hcl) .... Take 1 tablet by mouth once a day 13)  Flonase 50 Mcg/act Susp (Fluticasone propionate) .Marland Kitchen.. 1-2 sprays each nostril once daily 14)  Lexapro 10 Mg Tabs (Escitalopram oxalate) .... Start with 1/2 tablet by mouth nightly then  increase to 1 tablet by mouth nightly 15)  Clarithromycin 500 Mg Tabs (Clarithromycin) .... One tablet by mouth two times a day for infection  Hypertension Assessment/Plan:      The patient's hypertensive risk group is category B: At least one risk factor (excluding diabetes) with no target organ damage.  Today's blood pressure is 120/80.  Her blood pressure goal is < 140/90.   Patient Instructions: 1)  Sinus inflammation - use nasal spray twice daily 2)  Hold head down 3)  Anxiety - lexapro 10mg  - 1/2 tablet by mouth nightly for 1 week then 1 tablet by mouth nightly. 4)  Back pain -either muscle or may be related to fatty foods. 5)  Try  to avoid spicy, fried, fatty foods and see if symptoms improve 6)  Follow up in 4 weeks with S.Weaver for chronic issues and medication review (lexapro) Prescriptions: CLARITHROMYCIN 500 MG TABS (CLARITHROMYCIN) One tablet by mouth two times a day for infection  #14 x 0   Entered and Authorized by:   Lehman Prom FNP   Signed by:   Lehman Prom FNP on 05/06/2009   Method used:   Print then Give to Patient   RxID:   [id number] CLARITHROMYCIN 500 MG TABS (CLARITHROMYCIN) One tablet by mouth two times a day for infection  #14 x 0   Entered and Authorized by:   Lehman Prom FNP   Signed by:   Lehman Prom FNP on 05/06/2009   Method used:   Print then Give to Patient   RxID:   [id number] LEXAPRO 10 MG TABS (ESCITALOPRAM OXALATE) start with 1/2 tablet by mouth nightly then increase to 1 tablet by mouth nightly  #28 x 0   Entered and Authorized by:   Lehman Prom FNP   Signed by:   Lehman Prom FNP on 05/06/2009   Method used:   Print then Give to Patient   RxID:   [id number] FLONASE 50 MCG/ACT SUSP (FLUTICASONE PROPIONATE) One spray in each nostril two times a day *hold head down**  #1 x 3   Entered and Authorized by:   Lehman Prom FNP   Signed by:   Lehman Prom FNP on 05/06/2009   Method used:   Print then Give  to Patient   RxID:   [id number]

## 2010-05-09 NOTE — Progress Notes (Signed)
  Phone Note Outgoing Call   Summary of Call: This patient requested to be seen by a female provider months ago. Has she changed her mind or has there been a mistake in scheduling? Initial call taken by: Tereso Newcomer PA-C,  June 05, 2009 11:27 AM  Follow-up for Phone Call        there was a mistake in the scheduling...Marland Kitchen pt is schedule with right provider now Follow-up by: [place] Shannon,  June 06, 2009 10:29 AM

## 2010-05-09 NOTE — Miscellaneous (Signed)
Summary: [facility] Results   Diabetes Management History:      She says that she is exercising.    Diabetes Management Exam:    Eye Exam:       Eye Exam done elsewhere          Date: 06/27/2009          Results: normal          Done by: [facility]  Diabetes Management Assessment/Plan:      Her blood pressure goal is < 140/90.   Clinical Lists Changes  Observations: Added new observation of EYES COMMENT: 07/2010 (08/09/2009 8:16) Added new observation of EYE EXAM BY: [facility] (06/27/2009 8:16) Added new observation of DMEYEEXMRES: normal (06/27/2009 8:16) Added new observation of DIAB EYE EX: normal (06/27/2009 8:16)

## 2010-05-09 NOTE — Progress Notes (Signed)
Summary: APPT W/ SCOTT/ALSO NEEDS DEPO  Phone Note Outgoing Call   Summary of Call: CALLED  PT FOR REMINDER /HAS APPT. MON. @ 2:15/SHE WANTED ME TO LET YOU KNOW IT IS TIME FOR HER DEPO SHOT ALSO Initial call taken by: Arta Bruce,  June 03, 2009 2:49 PM  Follow-up for Phone Call        pt got last depo shot in Nov.24 and it looks as if her next one would have been Feb 15... pt says she did not have appt for another shot...Marland KitchenMarland Kitchen pt has appt on 06-15-09 to come in for f/u... pt says she is not having any sexually activity but i told her to still use condoms and protect herself until her appt..... i was unsure if she could come early for depo shot but if she can just let me know and she said she will come in Follow-up by: [place] Shannon,  June 06, 2009 10:32 AM  Additional Follow-up for Phone Call Additional follow up Details #1::        in either case she will need a serum pregnancy test and then return the next day for depoprovera - she will get rx when she comes in for labs as she will need to bring with her for administration. so it doesn't matter with me if she comes in to re-start the depo process as long as she keeps her cpe appt Additional Follow-up by: Lehman Prom FNP,  June 06, 2009 1:31 PM    Additional Follow-up for Phone Call Additional follow up Details #2::    pt has appt Follow-up by: Arm[place] Shannon,  June 08, 2009 11:38 AM

## 2010-05-09 NOTE — Progress Notes (Signed)
Summary: dermatology referral  Phone Note Call from Patient Call back at Home Phone [phone] Call back at [phone]   Summary of Call: The pt still waiting for a referral to see a dermatology because she has a rash in her skin. [facility] fnp Initial call taken by: Manon Hilding,  September 12, 2009 4:55 PM  Follow-up for Phone Call        Sent to N. Daphine Deutscher, FNP.  Dutch Quint RN  September 12, 2009 4:58 PM   Additional Follow-up for Phone Call Additional follow up Details #1::        not sure that I know pt needed a dermatology referral - i was aware that she had recurrent boils under her arms However you can refer pt to the dermatology clinic at [facility] street (make appt and notify pt-Kim or Graciela to assist) Additional Follow-up by: Lehman Prom FNP,  September 12, 2009 5:00 PM    Additional Follow-up for Phone Call Additional follow up Details #2::    Left message on answering machine to return call.  Dutch Quint RN  September 13, 2009 11:42 AM  Spoke with pt. and advised of dermatology clinic availability -- states she doesn't want to wait that long (7/19) and will go to the OP dept. at [facility] to have it evaluated and treated.  She states that she's tried "every lotion I can think of" and it hasn't helped the rash on her chest.   Follow-up by: Dutch Quint RN,  September 14, 2009 9:50 AM  Additional Follow-up for Phone Call Additional follow up Details #3:: Details for Additional Follow-up Action Taken: We can only offer  what we have available with the option that if someone cancels then perhaps she can be seen early but that it not gauranteed.  If she wants to go elsewhere on her own that is totally up to her. Additional Follow-up by: Lehman Prom FNP,  September 14, 2009 11:07 AM

## 2010-05-09 NOTE — Assessment & Plan Note (Signed)
Summary: HTN/Depression   Vital Signs:  Patient profile:   45 year old female Menstrual status:  depo Weight:      193.3 pounds BMI:     32.28 BSA:     1.95 Temp:     98.1 degrees F oral Pulse rate:   67 / minute Pulse rhythm:   regular Resp:     16 per minute BP sitting:   142 / 91  (left arm) Cuff size:   regular  Vitals Entered By: Levon Hedger (June 15, 2009 11:59 AM) CC: follow-up visit 4 weeks for chronic issues, Headache, Depression, Hypertension Management Is Patient Diabetic? No Pain Assessment Patient in pain? no      CBG Result 105 CBG Device ID B  Does patient need assistance? Functional Status Self care Ambulation Normal   CC:  follow-up visit 4 weeks for chronic issues, Headache, Depression, and Hypertension Management.  History of Present Illness:  Pt into the office for follow up on anxiety. Pt was started on lexapro during the last visit.   Anxiety has imroved Panic attacks are less frequent. Still is not able to sleep.  Nods off and on every 2 hours at night but does not every sleep consecutively. Remeron was previous started which worked well but she was sedated even into the next day. She even tried to break the pill but she still had the sluggish feeling during the day. Xanax use is very infrequent.    Last [facility] - done June [facility] at [facility] Pt does have a fibriod and the last ultrasound was done 4 years ago. She would like to restart the depoprovera.  Labs done today (previously done on last week - but pt did not come back for injection due to a pharmacy error)    Depression History:      The patient denies recurrent thoughts of death or suicide.        The patient denies that she feels like life is not worth living, denies that she wishes that she were dead, and denies that she has thought about ending her life.         Depression Treatment History:  Prior Medication Used:   Start Date: Assessment of Effect:    Comments:  lexapro     05/06/2009   much improvement     continued  Headache HPI:      Additional history: Pt went today for [facility]asure [place].    Headache Treatment History:      Ergots were tried but not effective.  The following preventive medications have been tried but failed: Imitrex.  She has tried sumatriptan (Imitrex) which was ineffective.    Hypertension History:      She complains of headache, but denies chest pain and palpitations.  Pt has not taken her meds today.        Positive major cardiovascular risk factors include hypertension and current tobacco user.  Negative major cardiovascular risk factors include female age less than 17 years old.      Habits & Providers  Alcohol-Tobacco-Diet     Alcohol drinks/day: <1     Tobacco Status: current     Cigarette Packs/Day: 1.0     Year Started: 1995     Pack years: 15+  Exercise-Depression-Behavior     Does Patient Exercise: yes     Exercise Counseling: to improve exercise regimen     Have you felt down or hopeless? yes     Have you  felt little pleasure in things? yes     Depression Counseling: further diagnostic testing and/or other treatment is indicated     Drug Use: no  Comments: Pt has started to decrease her tobacco use  Allergies (verified): 1)  ! Pcn  Social History: Does Patient Exercise:  yes  Review of Systems General:  Complains of sleep disorder. CV:  Denies chest pain or discomfort. Resp:  Denies cough. GI:  Denies abdominal pain, nausea, and vomiting. Neuro:  Complains of headaches.  Physical Exam  General:  alert.   Head:  normocephalic.   Lungs:  normal breath sounds.   Heart:  normal rate and regular rhythm.   Abdomen:  normal bowel sounds.   Msk:  up to the exam table Neurologic:  alert & oriented X3.   Skin:  color normal.   Psych:  Oriented X3.     Impression & Recommendations:  Problem # 1:  CONTRACEPTIVE MANAGEMENT (ICD-V25.09) pt to return tomorrow for  depoprovera Orders: T-Pregnancy (Serum), Qual.  ([id number])  Problem # 2:  HYPERTENSION (ICD-401.9) elevated today - no meds yet DASH diet  daily meds Her updated medication list for this problem includes:    Lisinopril-hydrochlorothiazide 20-25 Mg Tabs (Lisinopril-hydrochlorothiazide) ..... One tab in am  Problem # 3:  IRRITABLE BOWEL SYNDROME (ICD-564.1) will see if pt can get amitza for IBS symptoms  Problem # 4:  DEPRESSION (ICD-311) improved with lexapro will continue very limited usage of alprazolam Her updated medication list for this problem includes:    Alprazolam 0.25 Mg Tabs (Alprazolam) ..... One tablet by mouth daily as needed for anxiety    Lexapro 10 Mg Tabs (Escitalopram oxalate) ..... One tablet by mouth daily for mood    Trazodone Hcl 50 Mg Tabs (Trazodone hcl) .Marland Kitchen... 1-2 tablets by mouth nightly for sleep  Problem # 5:  MIGRAINE HEADACHE (ICD-346.90) will see if pharmacy can get zomig as pt reports zomig is not effective Her updated medication list for this problem includes:    Imitrex 50 Mg Tabs (Sumatriptan succinate) .Marland Kitchen... Take 1 by mouth at start of headache.  may repeat in 2 hours if headache recurs.  no more than 2 in 24 hour period.  Complete Medication List: 1)  Alprazolam 0.25 Mg Tabs (Alprazolam) .... One tablet by mouth daily as needed for anxiety 2)  Lisinopril-hydrochlorothiazide 20-25 Mg Tabs (Lisinopril-hydrochlorothiazide) .... One tab in am 3)  Valtrex 500 Mg Tabs (Valacyclovir hcl) .... Take 1 tablet by mouth every 12 hours for 3 days for outbreaks 4)  Imitrex 50 Mg Tabs (Sumatriptan succinate) .... Take 1 by mouth at start of headache.  may repeat in 2 hours if headache recurs.  no more than 2 in 24 hour period. 5)  Multivitamins Tabs (Multiple vitamin) .... Once daily 6)  Caltrate 600+d 600-400 Mg-unit Tabs (Calcium carbonate-vitamin d) .... Once daily 7)  Protonix 40 Mg Tbec (Pantoprazole sodium) .... Take 1 tablet by mouth once a day 8)   Allegra 180 Mg Tabs (Fexofenadine hcl) .... Take 1 tablet by mouth once a day 9)  Flonase 50 Mcg/act Susp (Fluticasone propionate) .Marland Kitchen.. 1-2 sprays each nostril once daily 10)  Lexapro 10 Mg Tabs (Escitalopram oxalate) .... One tablet by mouth daily for mood 11)  Trazodone Hcl 50 Mg Tabs (Trazodone hcl) .Marland Kitchen.. 1-2 tablets by mouth nightly for sleep 12)  Amitiza 8 Mcg Caps (Lubiprostone) .... One capsule by mouth two times a day  Hypertension Assessment/Plan:      The patient's hypertensive  risk group is category B: At least one risk factor (excluding diabetes) with no target organ damage.  Today's blood pressure is 142/91.  Her blood pressure goal is < 140/90.   Patient Instructions: 1)  Follow up tomorrow for depoprovera injection. 2)  Your lexapro has been sent to the pharmacy.  You can take the lexapro 10mg  by mouth daily. 3)  Probiotics - Get over the counter and start daily.  If you can't afford then get simethicone by mouth three times a day before meals. 4)  I am glad that you are doing well and starting to exercise and decrease your smoking. Prescriptions: AMITIZA 8 MCG CAPS (LUBIPROSTONE) One capsule by mouth two times a day  #60 x 5   Entered and Authorized by:   Lehman Prom FNP   Signed by:   Lehman Prom FNP on 06/15/2009   Method used:   Faxed to ...       [facility] - [facility] (retail)       [address] [place], [place]  [address]       Ph: [phone]       Fax: [phone]   RxID:   [id number] ALPRAZOLAM 0.25 MG TABS (ALPRAZOLAM) One tablet by mouth daily as needed for anxiety  #30 x 0   Entered and Authorized by:   Lehman Prom FNP   Signed by:   Lehman Prom FNP on 06/15/2009   Method used:   Print then Give to Patient   RxID:   [id number] TRAZODONE HCL 50 MG TABS (TRAZODONE HCL) 1-2 tablets by mouth nightly for sleep  #50 x 0   Entered and Authorized by:   Lehman Prom FNP   Signed by:   Lehman Prom FNP on 06/15/2009   Method used:   Faxed to ...       [facility] - Pharmac (retail)       [address] [place], [place]  [address]       Ph: [phone]       Fax: [phone]   RxID:   [id number] LEXAPRO 10 MG TABS (ESCITALOPRAM OXALATE) One tablet by mouth daily for mood  #30 x 5   Entered and Authorized by:   Lehman Prom FNP   Signed by:   Lehman Prom FNP on 06/15/2009   Method used:   Faxed to ...       [facility] - [facility] (retail)       [address] [place], [place]  [address]       Ph: [phone]       Fax: [phone]   RxID:   [id number]   Laboratory Results   Blood Tests   Date/Time Received: June 15, 2009 3:37 PM   HGBA1C: 6.0%   (Normal Range: Non-Diabetic - 3-6%   Control Diabetic - 6-8%) CBG Random:: 105

## 2010-05-09 NOTE — Miscellaneous (Signed)
Summary: Mammogram abnormal.  Clinical Lists Changes  Observations: Added new observation of MAMMOGRAM: Possible mass, right breast. Additional evaluation is indicated.  The patient will be contacted for additional studies and a supplementary report will follow.  No specific mammographic evidence of malignancy, left breast. (05/30/2009 11:29)      Mammogram  Procedure date:  05/30/2009  Findings:      Possible mass, right breast. Additional evaluation is indicated.  The patient will be contacted for additional studies and a supplementary report will follow.  No specific mammographic evidence of malignancy, left breast.

## 2010-05-09 NOTE — Letter (Signed)
Summary: *HSN Results Follow up  [facility]  [address] [place], [place] [address]   Phone: [phone]  Fax: [phone]      08/09/2009   [facility] [address] [place], [place]  [address]   Dear  Ms. Bailey Hooper,                            ____S.Drinkard,FNP   ____D. Gore,FNP       ____B. McPherson,MD   ____V. Rankins,MD    ____E. Mulberry,MD    __X__N. Daphine Deutscher, FNP  ____D. Reche Dixon, MD    ____K. Philipp Deputy, MD    ____Other     This letter is to inform you that your recent test(s):  Retasure Eye Screening    ____X___ is within acceptable limits  _______ requires a medication change  _______ requires a follow-up lab visit  _______ requires a follow-up visit with your provider   Comments:  Retasure eye screening is normal.       _________________________________________________________ If you have any questions, please contact our office [phone].                    Sincerely,    Bailey Prom FNP [facility]

## 2010-05-09 NOTE — Assessment & Plan Note (Signed)
Summary: Acute - Asthma   Vital Signs:  Patient profile:   45 year old female Menstrual status:  depo O2 Sat:      99 % on Room air Temp:     97.8 degrees F oral Pulse rate:   72 / minute Pulse rhythm:   regular Resp:     20 per minute BP sitting:   141 / 100  (right arm)  Vitals Entered By: Dutch Quint RN (October 05, 2009 11:08 AM)  O2 Sat at Rest %:  87-99 after cough O2 Flow:  Room air O2 Sat on room air at rest %:  98  O2 Sat Comments 02 at 2 lpm started at 10:50 am, sat = 98-99% on room air, pulse 90.  10:55 am O2 @ 2lpm , sat 99, feeling relief 11:00 am O2 stopped, sat = 99, pulse 78.  CC: persistent cough, asthma, allergies Pain Assessment Patient in pain? yes     Location: head, chest Intensity: 7 Type: heaviness Onset of pain  started five days ago Comments Allergies and sinus symptoms started about two weeks ago, cough started as well.    P/F = initial 110, before treatment. then 160 / 190 / 260   CC:  persistent cough, asthma, and allergies.  History of Present Illness: Presents with harsh coughing, nausea, emesis, dyspnea.  Has taken Coracidin, Mucinex, Nasonex, Tylenol without relief.  Went to urgent care and was sent home, new Rx  for additional HCTZ 25 mg. no other treatment given.  Is very fatigued, can't sleep, persistent productive cough, clear mucus, thin, until this morning, noted yellowish-green mucus.  Discussed with N.Martin, FNP, orders for O2 given.  Patient Instructions: 1)  Continue the mucinex every 12 hours 2)  Drink plenty of fluids 3)  Start advair - one inhalation two times a day for 1 week (sample) 4)  Take z-pack as directed 5)  Cough meds as needed for pain/cough 6)  Triage visit with Aggie Cosier on Friday to assess breathing   Allergies: 1)  ! Pcn  Review of Systems       Lungs primarily clear to auscultation, rare faint crackle noted to anterior, diminished sound at bases. General:  Complains of fatigue, loss of appetite,  and malaise; body is aching from coughing. CV:  Complains of chest pain or discomfort. Resp:  Complains of cough, shortness of breath, and wheezing. GI:  Complains of loss of appetite; denies nausea and vomiting.  Physical Exam  General:  alert.   Head:  normocephalic.   Lungs:  good air movement few scattered rhonchi with some wheezes Heart:  normal rate and regular rhythm.   Abdomen:  normal bowel sounds.     Impression & Recommendations:  Problem # 1:  ALLERGIC RHINITIS (ICD-477.9)  Her updated medication list for this problem includes:    Allegra 180 Mg Tabs (Fexofenadine hcl) .Marland Kitchen... Take 1 tablet by mouth once a day    Flonase 50 Mcg/act Susp (Fluticasone propionate) .Marland Kitchen... 1-2 sprays each nostril once daily  Problem # 2:  ASTHMA (ICD-493.90)  Her updated medication list for this problem includes:    Ventolin Hfa 108 (90 Base) Mcg/act Aers (Albuterol sulfate) .Marland Kitchen... 1-2 puffs every 4-6 hours as needed  Orders: Peak Flow Rate ([address]) Pulse Oximetry (single measurment) (94760) Peak Flow Meter (Z6109)  Complete Medication List: 1)  Alprazolam 0.25 Mg Tabs (Alprazolam) .... One tablet by mouth daily as needed for anxiety 2)  Lisinopril-hydrochlorothiazide 20-25 Mg Tabs (Lisinopril-hydrochlorothiazide) .... One tablet  by mouth daily 3)  Valtrex 500 Mg Tabs (Valacyclovir hcl) .... Take 1 tablet by mouth every 12 hours for 3 days for outbreaks 4)  Imitrex 50 Mg Tabs (Sumatriptan succinate) .... Take 1 by mouth at start of headache.  may repeat in 2 hours if headache recurs.  no more than 2 in 24 hour period. 5)  Multivitamins Tabs (Multiple vitamin) .... Once daily 6)  Caltrate 600+d 600-400 Mg-unit Tabs (Calcium carbonate-vitamin d) .... Once daily 7)  Protonix 40 Mg Tbec (Pantoprazole sodium) .... Take 1 tablet by mouth once a day 8)  Allegra 180 Mg Tabs (Fexofenadine hcl) .... Take 1 tablet by mouth once a day 9)  Flonase 50 Mcg/act Susp (Fluticasone propionate) .Marland Kitchen.. 1-2  sprays each nostril once daily 10)  Lexapro 10 Mg Tabs (Escitalopram oxalate) .... One tablet by mouth daily for mood 11)  Trazodone Hcl 50 Mg Tabs (Trazodone hcl) .Marland Kitchen.. 1-2 tablets by mouth nightly for sleep 12)  Amitiza 8 Mcg Caps (Lubiprostone) .... One capsule by mouth two times a day 13)  Ventolin Hfa 108 (90 Base) Mcg/act Aers (Albuterol sulfate) .Marland Kitchen.. 1-2 puffs every 4-6 hours as needed 14)  Azithromycin 250 Mg Tabs (Azithromycin) .... Dispense as directed 15)  Acetaminophen-codeine 120-12 Mg/42ml Soln (Acetaminophen-codeine) .... One-two teapoons ever 6 hours as needed for cough  Patient Instructions: 1)  Continue the mucinex every 12 hours 2)  Drink plenty of fluids 3)  Start advair - one inhalation two times a day for 1 week (sample) 4)  Take z-pack as directed 5)  Cough meds as needed for pain/cough 6)  Triage visit with Aggie Cosier on Friday to assess breathing Prescriptions: ACETAMINOPHEN-CODEINE 120-12 MG/5ML SOLN (ACETAMINOPHEN-CODEINE) One-Two teapoons ever 6 hours as needed for cough  #4 ounces x 0   Entered and Authorized by:   Lehman Prom FNP   Signed by:   Lehman Prom FNP on 10/05/2009   Method used:   Print then Give to Patient   RxID:   [id number] AZITHROMYCIN 250 MG TABS (AZITHROMYCIN) dispense as directed  #6 x 0   Entered and Authorized by:   Lehman Prom FNP   Signed by:   Lehman Prom FNP on 10/05/2009   Method used:   Print then Give to Patient   RxID:   [id number]   Prevention & Chronic Care Immunizations   Influenza vaccine: Fluvax 3+  (03/10/2009)    Tetanus booster: Not documented    Pneumococcal vaccine: Not documented  Other Screening   Pap smear:  Specimen Adequacy: Satisfactory for evaluation.   Interpretation/Result:Negative for intraepithelial Lesion or Malignancy.     (06/24/2009)   Pap smear due: 07/2010    Mammogram: Diagnostic Right Mammogram and Right Breast Ultrasound due to abnormal screening mammogram:  No  mammographic or sonographic evidence of malignancy.  (06/06/2009)   Mammogram action/deferral: Screening mammogram in 1 year.     (06/06/2009)   Smoking status: current  (06/24/2009)  Lipids   Total Cholesterol: Not documented   LDL: Not documented   LDL Direct: Not documented   HDL: Not documented   Triglycerides: Not documented  Hypertension   Last Blood Pressure: 141 / 100  (10/05/2009)   Serum creatinine: 0.71  (01/17/2009)   Serum potassium 3.7  (01/17/2009)  Self-Management Support :    Hypertension self-management support: Not documented       Orders Added: 1)  Est. Patient Level II [99212] 2)  Peak Flow Rate [[address]] 3)  Pulse Oximetry (single measurment) [94760] 4)  Peak Flow Meter [id number]

## 2010-05-09 NOTE — Progress Notes (Signed)
Summary: Meds  Phone Note Call from Patient   Summary of Call: PT UNDER ALOT OF STRESS AND IS BREAKING OUT ALOT/WANTS YOU TO CALL HER SOMETHING IN TO [facility]///PHONE  [phone]//CAN LEAVE A MESSAGE Initial call taken by: Arta Bruce,  December 20, 2009 2:13 PM  Follow-up for Phone Call        Levon Hedger  December 21, 2009 9:18 AM Left message on machine for pt to return call to the office.  Additional Follow-up for Phone Call Additional follow up Details #1::        Pt can try hydrocortisone cream or benadryl both of which are over the counter Also advise pt that imitrex is being sent to [facility] pharmacy - received faxed request Additional Follow-up by: Lehman Prom FNP,  December 22, 2009 8:34 AM    Additional Follow-up for Phone Call Additional follow up Details #2::    Levon Hedger  December 23, 2009 12:52 PM Left message on machine for pt to return call to the office.  Levon Hedger  December 26, 2009 4:33 PMspoke with pt and she said that the cortisone cream will not help pt states she has had an herpes outbreak and she says she thinks it is from all the stress she is under right now and she is asking for something for the the outbreak sent to the pharmacy.  Additional Follow-up for Phone Call Additional follow up Details #3:: Details for Additional Follow-up Action Taken: notify pt that valtrex has been sent to [facility] pharmacy n.martin,fnp December 26, 2009 4:55 PM  pt aware Michelle Nasuti  December 26, 2009 5:34 PM   New/Updated Medications: VALTREX 500 MG TABS (VALACYCLOVIR HCL) Take 1 tablet by mouth every 12 hours for 3 days for outbreaks Prescriptions: VALTREX 500 MG TABS (VALACYCLOVIR HCL) Take 1 tablet by mouth every 12 hours for 3 days for outbreaks  #6 x 1   Entered and Authorized by:   Lehman Prom FNP   Signed by:   Lehman Prom FNP on 12/26/2009   Method used:   Faxed to ...       [facility] -  Pharmac (retail)       [address] [place], [place]  [address]       Ph: [phone]       Fax: [phone]   RxID:   [id number] IMITREX 50 MG TABS (SUMATRIPTAN SUCCINATE) Take 1 by mouth at start of headache.  May repeat in 2 hours if headache recurs.  No more than 2 in 24 hour period.  #10 x 0   Entered and Authorized by:   Lehman Prom FNP   Signed by:   Lehman Prom FNP on 12/22/2009   Method used:   Faxed to ...       [facility] - Pharmac (retail)       [address] [place], [place]  [address]       Ph: [phone]       Fax: [phone]   RxID:   [id number]

## 2010-05-09 NOTE — Letter (Signed)
Summary: [facility]/NO STRESS TEST OR ECHO  [facility]/NO STRESS TEST OR ECHO   Imported By: Arta Bruce 04/27/2009 11:11:06  _____________________________________________________________________  External Attachment:    Type:   Image     Comment:   External Document

## 2010-05-09 NOTE — Progress Notes (Signed)
Summary: DEPO DUE  Phone Note Call from Patient Call back at Home Phone [phone]   Reason for Call: Refill Medication Summary of Call: MARTIN PT. CALLING IN FOR HER DEPO SHOT FOR NEXT MONTH. [facility]. Initial call taken by: Leodis Rains,  Aug 19, 2009 11:15 AM  Follow-up for Phone Call        forward to N. Daphine Deutscher, FNP Follow-up by: Levon Hedger,  Aug 19, 2009 2:32 PM  Additional Follow-up for Phone Call Additional follow up Details #1::        Aren't there still depoprovera injections left in house? if so, pt can come here to get it until that supply is completed. will need to keep a running total of how many is available.  when there are none available in house then pts will need rx sent to [facility] Additional Follow-up by: Lehman Prom FNP,  Aug 22, 2009 7:41 AM    Additional Follow-up for Phone Call Additional follow up Details #2::    pt informed of above information. Follow-up by: Levon Hedger,  Aug 23, 2009 5:11 PM

## 2010-05-09 NOTE — Progress Notes (Signed)
Summary: ACUTE SICKNESS  Phone Note Call from Patient   Caller: Patient Reason for Call: Acute Illness Complaint: Cough/Sore throat, Breathing Problems Summary of Call: PT IS BEING SICK FOR 2 WEEKS . PT HAVE A SEVERE COUGH AND ASTHMA AND SHE NEED A PRESCRIPTION . PLEASE, CALL HER @ [phone].  Initial call taken by: Cheryll Dessert,  October 04, 2009 3:53 PM  Follow-up for Phone Call        States coughing persistently, keeping her up at night.  Coughing large amounts of clear phlegm, asthma giving her trouble, using her Ventolin and still not getting relief.  Has been going on for two weeks and she is tired, wants Prednisone and other meds if needed.  Appt. 10/05/09.Dutch Quint RN  October 04, 2009 4:26 PM   Additional Follow-up for Phone Call Additional follow up Details #1::        will see pt at above appt date Additional Follow-up by: Lehman Prom FNP,  October 05, 2009 7:43 AM

## 2010-05-09 NOTE — Progress Notes (Signed)
Summary: Due for Depo Shot  Phone Note Call from Patient Call back at Home Phone [phone]   Summary of Call: Pt states that she due for her Depo Shot on Feb 28 can somebody check and make sure? Alben Spittle PA-c Initial call taken by: Manon Hilding,  June 03, 2009 8:15 AM  Follow-up for Phone Call        this pt has two phone notes open and sent other phone note to [facility] already Follow-up by: Armenia Shannon,  June 06, 2009 10:38 AM

## 2010-05-09 NOTE — Progress Notes (Signed)
Summary: ALLERGIES ARE REALLY BAD  Phone Note Call from Patient Call back at Home Phone [phone]   Reason for Call: Acute Illness Summary of Call: MARTIN PT. MS Lennartz SAYS HER ALLERGIES ARE WORSE. SHE HAS THE DRAINAGE, SORE THROAT, NASUATED, CONGESTION, COUGHING, SNEEZING AND HEADACHE. SHE SAYS HER ALLEGRA IS NOT HELPING AND SHE TRIED MUCINEX AND ITS NOT HELPING.  SHE USES [facility], BUT [facility] ON [address]. Initial call taken by: Leodis Rains,  Aug 11, 2009 9:43 AM  Follow-up for Phone Call        forward to N. Daphine Deutscher, fNP Follow-up by: Levon Hedger,  Aug 11, 2009 10:50 AM  Additional Follow-up for Phone Call Additional follow up Details #1::        Is pt using her flonase nasal spray? All allergy meds are now over the counter - allegra, zrytec and claritin so she can feel free to purchase any of those.   which of those has she taken in the past? She can't take any sudafed or decongestant due to blood pressure.  Limit outside events on high pollen day count days (can watch news to find out when these are)  keep windows up Additional Follow-up by: Lehman Prom FNP,  Aug 11, 2009 12:17 PM    Additional Follow-up for Phone Call Additional follow up Details #2::    pt informed of above information. Follow-up by: Levon Hedger,  Aug 11, 2009 4:42 PM

## 2010-05-09 NOTE — Progress Notes (Signed)
Summary: Office Visit/DEPRESSION SCREENING  Office Visit/DEPRESSION SCREENING   Imported By: Arta Bruce 04/29/2009 15:37:43  _____________________________________________________________________  External Attachment:    Type:   Image     Comment:   External Document

## 2010-05-09 NOTE — Miscellaneous (Signed)
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

## 2010-05-09 NOTE — Letter (Signed)
Summary: CALL A NURSE  CALL A NURSE   Imported By: Arta Bruce 10/05/2009 12:48:10  _____________________________________________________________________  External Attachment:    Type:   Image     Comment:   External Document

## 2010-05-09 NOTE — Progress Notes (Signed)
Summary: Was antibiotic for boils  Phone Note Call from Patient   Summary of Call: Wants a refill on the antibiotic cephalexin which was Rx'd by Dr. Lovell Sheehan was at the [facility] office.   It was Rx'd last year for boils which have reoccurred.  Wants a Rx sent to the [facility].  Advised that she might need an appointment in order to receive a Rx.  Wants a return call back. Initial call taken by: Dutch Quint RN,  February 06, 2010 2:37 PM Initial call taken by:    Follow-up for Phone Call        yes, she will at least need a triage visit to assess areas All boils don't need antibiotics so if symptomatic treatment does not heal then may consider after assessement most of them require warm compresses washing with anti-bacterial soap - which she can try if she has not already Follow-up by: Lehman Prom FNP,  February 06, 2010 2:48 PM  Additional Follow-up for Phone Call Additional follow up Details #1::        Left message on answering machine for pt. to return call.  Dutch Quint RN  February 09, 2010 9:27 AM  States has been doing home management as suggested -- areas persist.  Coming in for a depo injection this morning, will assess area at that time. Additional Follow-up by: Dutch Quint RN,  February 09, 2010 9:48 AM    Additional Follow-up for Phone Call Additional follow up Details #2::    In office.  Dutch Quint RN  February 09, 2010 1:01 PM

## 2010-05-09 NOTE — Progress Notes (Signed)
Summary: ? Medication  Phone Note Outgoing Call   Summary of Call: Med list from this office shows that pt is taking lisinopril/HCTZ however I have received a refill request for HCTZ 25mg . ??? the prescriber Dr. Artis Flock (who is that?) Why was it changed? Initial call taken by: Lehman Prom FNP,  October 25, 2009 1:04 PM  Follow-up for Phone Call        Bailey Hooper  October 25, 2009 12:36 PM Left message on machine for pt to return call to the office.  Bailey Hooper  October 26, 2009 4:57 PM Left message on machine for pt to return call to the office.  spoke with pt she said that that was a previous provider when she first received the orange card.  She said that she did call for refill on medication from [facility] pharmacy but she is taking Lisinopril/HCTz that is one pill she says that she is taking and she gets that refilled from [facility] [address] Follow-up by: Bailey Hooper,  October 28, 2009 12:56 PM  Additional Follow-up for Phone Call Additional follow up Details #1::        Ok so I'm assuming I can discard the request from [facility] pharmacy. When she needs refills from [facility] they have an automated system and will send refills electronically Additional Follow-up by: Lehman Prom FNP,  October 28, 2009 1:09 PM

## 2010-05-09 NOTE — Progress Notes (Signed)
Summary: Office Visit//DEPRESSION SCREENING  Office Visit//DEPRESSION SCREENING   Imported By: Arta Bruce 09/08/2009 12:25:38  _____________________________________________________________________  External Attachment:    Type:   Image     Comment:   External Document

## 2010-05-09 NOTE — Letter (Signed)
Summary: RETASURE  RETASURE   Imported By: Arta Bruce 10/06/2009 10:48:41  _____________________________________________________________________  External Attachment:    Type:   Image     Comment:   External Document

## 2010-05-09 NOTE — Assessment & Plan Note (Signed)
Summary: F/u Breathing  Nurse Visit   Patient Instructions: 1)  Lungs sounds clear today. 2)  As per Jesse Fall, FNP 3)  Continue Advair and Coricidin as needed for cough. 4)  Stop Mucinex and see if still draining mucus. 5)  Do deep breathing exercises to rebuild up lung capacity. 6)  Follow-up as needed.   Review of Systems       States she feels clammy and sweaty, light-headed when climbing stairs, feels different than when asthma attack comes on. Resp:  Complains of cough and sputum productive; Lungs clear to auscultation, no wheezes or crackles heard.  States still coughing from post-nasal drainage.Marland Kitchen   Physical Exam  Mouth:  pharynx pink and moist.     CC:  f/u from appt. last week for allergic rhinitis and asthma exacerbation.  History of Present Illness: States she is still taking Z-pak, Advair and Coricidin.  Has been taking Mucinex, c/o still a lot of sinus drainage, causing her to cough and hoarse voice.  CC: f/u from appt. last week for allergic rhinitis and asthma exacerbation Comments F/u from last week visit for exacerbation of asthma and allergic rhinitis.  States she feels 80% better. Color good, respirations unlabored.   Allergies: 1)  ! Pcn  Orders Added: 1)  Est. Patient Level I [62952]  Appended Document: F/u Breathing    Clinical Lists Changes  Problems: Assessed ASTHMA as comment only -  Her updated medication list for this problem includes:    Ventolin Hfa 108 (90 Base) Mcg/act Aers (Albuterol sulfate) .Marland Kitchen... 1-2 puffs every 4-6 hours as needed      Impression & Recommendations:  Problem # 1:  ASTHMA (ICD-493.90)  Her updated medication list for this problem includes:    Ventolin Hfa 108 (90 Base) Mcg/act Aers (Albuterol sulfate) .Marland Kitchen... 1-2 puffs every 4-6 hours as needed  Complete Medication List: 1)  Alprazolam 0.25 Mg Tabs (Alprazolam) .... One tablet by mouth daily as needed for anxiety 2)  Lisinopril-hydrochlorothiazide 20-25 Mg  Tabs (Lisinopril-hydrochlorothiazide) .... One tablet by mouth daily 3)  Valtrex 500 Mg Tabs (Valacyclovir hcl) .... Take 1 tablet by mouth every 12 hours for 3 days for outbreaks 4)  Imitrex 50 Mg Tabs (Sumatriptan succinate) .... Take 1 by mouth at start of headache.  may repeat in 2 hours if headache recurs.  no more than 2 in 24 hour period. 5)  Multivitamins Tabs (Multiple vitamin) .... Once daily 6)  Caltrate 600+d 600-400 Mg-unit Tabs (Calcium carbonate-vitamin d) .... Once daily 7)  Protonix 40 Mg Tbec (Pantoprazole sodium) .... Take 1 tablet by mouth once a day 8)  Allegra 180 Mg Tabs (Fexofenadine hcl) .... Take 1 tablet by mouth once a day 9)  Flonase 50 Mcg/act Susp (Fluticasone propionate) .Marland Kitchen.. 1-2 sprays each nostril once daily 10)  Lexapro 10 Mg Tabs (Escitalopram oxalate) .... One tablet by mouth daily for mood 11)  Trazodone Hcl 50 Mg Tabs (Trazodone hcl) .Marland Kitchen.. 1-2 tablets by mouth nightly for sleep 12)  Amitiza 8 Mcg Caps (Lubiprostone) .... One capsule by mouth two times a day 13)  Ventolin Hfa 108 (90 Base) Mcg/act Aers (Albuterol sulfate) .Marland Kitchen.. 1-2 puffs every 4-6 hours as needed 14)  Azithromycin 250 Mg Tabs (Azithromycin) .... Dispense as directed 15)  Acetaminophen-codeine 120-12 Mg/34ml Soln (Acetaminophen-codeine) .... One-two teapoons ever 6 hours as needed for cough

## 2010-05-09 NOTE — Progress Notes (Signed)
Summary: REFILL ON BP MEDS  Phone Note Call from Patient Call back at Home Phone [phone]   Reason for Call: Refill Medication Summary of Call: Bailey Hooper CALLED AND SAYS THAT SHE FINISHED ALL HER LISINOPRIL THAT DR Lovell Sheehan HAD PRESCRIBED FOR HER AND NOW SHE NEEDS A REFILL AND WANTS TO KNOW IF YOU CAN CALL IT INTO [facility] ON [address]. Bailey Hooper SAYS THAT WHEN SHE STARTED COMING HERE SHE STILL HAD REFILLS LEFT AND NOW SHE  HAS FINISHED IT. Initial call taken by: Leodis Rains,  April 27, 2009 2:25 PM  Follow-up for Phone Call        Is this my patient or Bailey Hooper's?  Phone note in Nov says she wanted to change providers.  Rx sent to [facility]. Follow-up by: Tereso Newcomer PA-C,  April 27, 2009 5:50 PM  Additional Follow-up for Phone Call Additional follow up Details #1::        Left message on answering machine for pt to call back...Bailey KitchenMarland KitchenArmenia Hooper  April 28, 2009 8:09 AM     Prescriptions: LISINOPRIL-HYDROCHLOROTHIAZIDE 20-25 MG TABS (LISINOPRIL-HYDROCHLOROTHIAZIDE) ONE TAB IN AM  #30 x 5   Entered and Authorized by:   Tereso Newcomer PA-C   Signed by:   Tereso Newcomer PA-C on 04/27/2009   Method used:   Electronically to        [facility] [phone]* (retail)       [address]       [place], [place]  [address]       Ph: [phone]       Fax: [phone]   RxID:   [id number]

## 2010-05-11 ENCOUNTER — Telehealth (INDEPENDENT_AMBULATORY_CARE_PROVIDER_SITE_OTHER): Payer: Self-pay | Admitting: Nurse Practitioner

## 2010-05-11 NOTE — Progress Notes (Signed)
Summary: meds refill Depo-Provera  Phone Note Refill Request   Refills Requested: Medication #1:  DEPO-PROVERA 150 MG/ML SUSP Injection once every 3 months for birth control. [facility]  Initial call taken by: Domenic Polite,  April 06, 2010 4:01 PM  Follow-up for Phone Call        Last seen 09/2009 -- refill Depo? Follow-up by: Dutch Quint RN,  April 06, 2010 5:55 PM  Additional Follow-up for Phone Call Additional follow up Details #1::        pt has refills on depoprovera - written 11/2009 with 3 refills.  you can confirm with pharmacy next depoprovera is due january 25th according to last office visit Additional Follow-up by: Lehman Prom FNP,  April 06, 2010 5:58 PM    Additional Follow-up for Phone Call Additional follow up Details #2::    Confirmed refills with [facility] -- pt. notified of available refills.  Will call back for January Depo appt.  Dutch Quint RN  April 07, 2010 12:48 PM

## 2010-05-11 NOTE — Assessment & Plan Note (Signed)
Summary: depo  Nurse Visit   Allergies: 1)  ! Pcn  Medication Administration  Injection # 1:    Medication: Depo-Provera 150mg     Diagnosis: CONTRACEPTIVE MANAGEMENT (ICD-V25.09)    Route: IM    Site: R deltoid    Exp Date: 11/06/2012    Lot #: [id number]    Mfr: [employer]    Comments: VFI [id number]  Next Depo due 07/20/10    Patient tolerated injection without complications    Given by: Dutch Quint RN (May 03, 2010 12:14 PM)  Orders Added: 1)  Depo-Provera 150mg  [J1055] 2)  Admin of patients own med IM/SQ [96372M] 3)  Est. Patient Nurse visit [09003]

## 2010-05-12 NOTE — Letter (Signed)
Summary: PODIATRY  PODIATRY   Imported By: Arta Bruce 08/24/2009 15:44:40  _____________________________________________________________________  External Attachment:    Type:   Image     Comment:   External Document

## 2010-05-17 NOTE — Progress Notes (Signed)
Summary: Query:  Refill lisinopril-HCTZ?  Phone Note Outgoing Call   Summary of Call: Last seen 09/2009.  Refill lisinopril-HCTZ per protocol?  Has scheduled CPP 06/16/10. Initial call taken by: Dutch Quint RN,  May 11, 2010 5:58 PM  Follow-up for Phone Call        yes, ok to refil per protocol Follow-up by: Lehman Prom FNP,  May 11, 2010 6:06 PM  Additional Follow-up for Phone Call Additional follow up Details #1::        Noted.  Refill completed.  Dutch Quint RN  May 12, 2010 9:23 AM     Prescriptions: LISINOPRIL-HYDROCHLOROTHIAZIDE 20-25 MG TABS (LISINOPRIL-HYDROCHLOROTHIAZIDE) One tablet by mouth daily  #30 Each x 3   Entered by:   Dutch Quint RN   Authorized by:   Lehman Prom FNP   Signed by:   Dutch Quint RN on 05/12/2010   Method used:   Electronically to        [facility] [phone]* (retail)       [address]       [place], [place]  [address]       Ph: [phone]       Fax: [phone]   RxID:   [id number]

## 2010-05-17 NOTE — Progress Notes (Signed)
Summary: Pt. would like referral to pulmonologist  Phone Note Outgoing Call   Summary of Call: Pt. was in office yesterday for depo injection.  States she has been having some trouble with her breathing recently, in no acute distress at present.  States since she has dx of asthma, would like to know about referral to pulmonologist.  States she had seen a cardiologist yearly for a heart murmur, but since coming to this practice, has been unable to continue.  Initial call taken by: Dutch Quint RN,  May 04, 2010 12:05 PM  Follow-up for Phone Call        no need to refer to pulmonologist just for routine maintenance. that can be done here.  There is an asthma protocol that we follow and then if control not achieved then we consdier referral. No acute distress.  she is only on MDI.  no need for referral at this time  Follow-up by: Lehman Prom FNP,  May 04, 2010 2:22 PM  Additional Follow-up for Phone Call Additional follow up Details #1::        Left message on answering machine for pt. to return call.  Dutch Quint RN  May 04, 2010 4:26 PM  Left message on answering machine for pt. to return call.  Dutch Quint RN  May 05, 2010 3:57 PM     Additional Follow-up for Phone Call Additional follow up Details #2::    nasal D/C sm amt clear /yellow mucous, no chest congestion, sore throat, eyes itching, ears popping, afebrile. Advised increase fluids, continue all meds, increase humidity, may use saline nasal spray or netti pot as needed, gargle with warm salt water, drink hot tea with lemon and honey, may take coricedin. Call office if fever or discharge turns green or starts to feel worse. Gaylyn Cheers RN  May 08, 2010 4:40 PM

## 2010-06-08 ENCOUNTER — Ambulatory Visit (HOSPITAL_COMMUNITY)
Admission: RE | Admit: 2010-06-08 | Discharge: 2010-06-08 | Disposition: A | Discharge status: Home / Self Care | Payer: Self-pay | Source: Ambulatory Visit | Attending: Nurse Practitioner | Admitting: Nurse Practitioner

## 2010-06-08 DIAGNOSIS — Z1231 Encounter for screening mammogram for malignant neoplasm of breast: Secondary | ICD-10-CM | POA: Insufficient documentation

## 2010-06-16 ENCOUNTER — Encounter: Payer: Self-pay | Admitting: Nurse Practitioner

## 2010-06-16 ENCOUNTER — Other Ambulatory Visit: Payer: Self-pay | Admitting: Nurse Practitioner

## 2010-06-16 ENCOUNTER — Encounter (INDEPENDENT_AMBULATORY_CARE_PROVIDER_SITE_OTHER): Payer: Self-pay | Admitting: Nurse Practitioner

## 2010-06-16 DIAGNOSIS — R5381 Other malaise: Secondary | ICD-10-CM | POA: Insufficient documentation

## 2010-06-16 DIAGNOSIS — R5383 Other fatigue: Secondary | ICD-10-CM | POA: Insufficient documentation

## 2010-06-16 DIAGNOSIS — N951 Menopausal and female climacteric states: Secondary | ICD-10-CM | POA: Insufficient documentation

## 2010-06-16 LAB — CYTOLOGY - PAP

## 2010-06-16 LAB — CONVERTED CEMR LAB: KOH Prep: NEGATIVE

## 2010-06-19 ENCOUNTER — Telehealth (INDEPENDENT_AMBULATORY_CARE_PROVIDER_SITE_OTHER): Payer: Self-pay | Admitting: Nurse Practitioner

## 2010-06-19 ENCOUNTER — Encounter (INDEPENDENT_AMBULATORY_CARE_PROVIDER_SITE_OTHER): Payer: Self-pay | Admitting: Nurse Practitioner

## 2010-06-19 DIAGNOSIS — E559 Vitamin D deficiency, unspecified: Secondary | ICD-10-CM | POA: Insufficient documentation

## 2010-06-19 LAB — CONVERTED CEMR LAB
AST: 19 units/L (ref 0–37)
Albumin: 4.8 g/dL (ref 3.5–5.2)
BUN: 16 mg/dL (ref 6–23)
Basophils Absolute: 0 10*3/uL (ref 0.0–0.1)
Chloride: 105 meq/L (ref 96–112)
Eosinophils Absolute: 0.1 10*3/uL (ref 0.0–0.7)
FSH: 8.9 milliintl units/mL
GC Probe Amp, Urine: NEGATIVE
Glucose, Bld: 93 mg/dL (ref 70–99)
Lymphocytes Relative: 51 % — ABNORMAL HIGH (ref 12–46)
Lymphs Abs: 3.6 10*3/uL (ref 0.7–4.0)
Monocytes Absolute: 0.7 10*3/uL (ref 0.1–1.0)
Neutro Abs: 2.7 10*3/uL (ref 1.7–7.7)
Neutrophils Relative %: 38 % — ABNORMAL LOW (ref 43–77)
Platelets: 361 10*3/uL (ref 150–400)
Potassium: 4.4 meq/L (ref 3.5–5.3)
RBC: 4.67 M/uL (ref 3.87–5.11)
RDW: 14.2 % (ref 11.5–15.5)
Sodium: 143 meq/L (ref 135–145)
TSH: 0.617 microintl units/mL (ref 0.350–4.500)
Vit D, 25-Hydroxy: 21 ng/mL — ABNORMAL LOW (ref 30–89)
WBC: 7.1 10*3/uL (ref 4.0–10.5)

## 2010-06-20 ENCOUNTER — Telehealth (INDEPENDENT_AMBULATORY_CARE_PROVIDER_SITE_OTHER): Payer: Self-pay | Admitting: Nurse Practitioner

## 2010-06-20 NOTE — Letter (Signed)
Summary: Handout Printed  Printed Handout:  - Perimenopause 

## 2010-06-21 ENCOUNTER — Encounter (INDEPENDENT_AMBULATORY_CARE_PROVIDER_SITE_OTHER): Payer: Self-pay | Admitting: Nurse Practitioner

## 2010-06-27 NOTE — Progress Notes (Signed)
Summary: HEP B exposure  Phone Note Call from Patient Call back at [facility] Phone [phone]   Summary of Call: PT HAS SOME QUESTIONS ABOUT HEP B.   PLS CALL ASAP. Initial call taken by: Ayesha Rumpf,  June 20, 2010 8:08 AM  Follow-up for Phone Call        Pt. states she is intimate with a man who has been exposed to Hep B.  They are currently awaiting test results to see if he actually has it.  He is currently asymptomatic.  She believes she has only had 2 out of the 3 Hep B vaccinations, about 5 years ago.  She wants to know if she is protected against contracting Hep B by having only 2 of the 3.  I googled for info, but I did not get conclusive information.  Follow-up by: Dutch Quint RN,  June 20, 2010 3:25 PM  Additional Follow-up for Phone Call Additional follow up Details #1::        Full coverage is 3 vaccine. Some people have high enough titers for coverage at 2 vaccines but that is unclear in all pts so for complete adequacy 3 vaccines are recommended. i would advise her to wait until her boyfriends titers have returned.  If negative then she is ok. I would still advise that she contact [facility] and see how many of the hep b immunizations she has had   Additional Follow-up by: Lehman Prom FNP,  June 21, 2010 10:53 AM    Additional Follow-up for Phone Call Additional follow up Details #2::    Left message on voicemail for pt. to return call.  Dutch Quint RN  June 21, 2010 1:06 PM  Pt. advised of provider's response and recommendations -- verbalized understanding and agreement.  Dutch Quint RN  June 21, 2010 4:46 PM

## 2010-06-27 NOTE — Letter (Signed)
Summary: *HSN Results Follow up  [facility]  [address] [place], [place] [address]   Phone: [phone]  Fax: [phone]      06/21/2010   [facility] [address] [place], [place]  [address]   Dear  Ms. Keyonta Hartig,                            ____S.Drinkard,FNP   ____D. Gore,FNP       ____B. McPherson,MD   ____V. Rankins,MD    ____E. Mulberry,MD    _X___N. Daphine Deutscher, FNP  ____D. Reche Dixon, MD    ____K. Philipp Deputy, MD    ____Other     This letter is to inform you that your recent test(s):  ___X____Pap Smear    _______Lab Test     _______X-ray    ____X___ is within acceptable limits  _______ requires a medication change  _______ requires a follow-up lab visit  _______ requires a follow-up visit with your provider   Comments: Pap Smear results are normal.       _________________________________________________________ If you have any questions, please contact our office [phone].                    Sincerely,    Lehman Prom FNP [facility]

## 2010-06-27 NOTE — Assessment & Plan Note (Signed)
Summary: Complete Physical Exam   Vital Signs:  Patient profile:   45 year old female Menstrual status:  depo Weight:      194.8 pounds BMI:     32.53 Temp:     98.1 degrees F oral Pulse rate:   89 / minute Pulse rhythm:   regular Resp:     20 per minute BP sitting:   133 / 86  (left arm) Cuff size:   regular  Vitals Entered By: Levon Hedger (June 16, 2010 2:27 PM)  Nutrition Counseling: Patient's BMI is greater than 25 and therefore counseled on weight management options. CC: CPP, Hypertension Management, Headache, Depression Is Patient Diabetic? No Pain Assessment Patient in pain? no       Does patient need assistance? Functional Status Self care Ambulation Normal  Vision Screening:Left eye w/o correction: 20 / 20-1 Right Eye w/o correction: 20 / 20 Both eyes w/o correction:  20/ 20+4        Vision Entered By: Levon Hedger (June 16, 2010 2:29 PM)   Primary Care Provider:  Lehman Prom FNP  CC:  CPP, Hypertension Management, Headache, and Depression.  History of Present Illness:  Pt into the office for a complete physical exam  PAP - last done 1 year ago Menses - none since she stated on depoprovera  Mammogram - last done in 06/2010 no self breast exams at home  Optho - needs eye exam  Dental - need dental exam - has seen [facility] in the past  Mother present today in office  Asthma History    Initial Asthma Severity Rating:    Age range: 12+ years    Symptoms: 0-2 days/week    Nighttime Awakenings: 0-2/month    Interferes w/ normal activity: no limitations    SABA use (not for EIB): 0-2 days/week    Exacerbations requiring oral systemic steroids: 0-1/year    Asthma Severity Assessment: Intermittent  Depression History:      Positive alarm features for depression include impaired concentration (indecisiveness).  However, she denies recurrent thoughts of death or suicide.        Psychosocial stress factors include major life  changes.  The patient denies that she feels like life is not worth living, denies that she wishes that she were dead, and denies that she has thought about ending her life.         Depression Treatment History:  Prior Medication Used:   Start Date: Assessment of Effect:   Comments:  lexapro     05/06/2009   much improvement     continued Effexor XR (venlafaxine-XR)   06/16/2010     --       started  Headache Treatment History:      Ergots were tried but not effective.  The following preventive medications have been tried but failed: Imitrex.    Hypertension History:      She denies headache, chest pain, and palpitations.  She notes no problems with any antihypertensive medication side effects.        Positive major cardiovascular risk factors include hypertension and current tobacco user.  Negative major cardiovascular risk factors include female age less than 57 years old.        Further assessment for target organ damage reveals no history of ASHD, cardiac end-organ damage (CHF/LVH), stroke/TIA, peripheral vascular disease, renal insufficiency, or hypertensive retinopathy.       Habits & Providers  Alcohol-Tobacco-Diet     Alcohol drinks/day: <1  Tobacco Status: current     Tobacco Counseling: to quit use of tobacco products     Cigarette Packs/Day: 1.0     Year Started: 1995     Pack years: 15+  Exercise-Depression-Behavior     Does Patient Exercise: yes     Exercise Counseling: to improve exercise regimen     Depression Counseling: further diagnostic testing and/or other treatment is indicated     Drug Use: no  Comments: PHQ-9 score = 10  Allergies (verified): 1)  ! Pcn  Review of Systems General:  Complains of sweats; Hot flashes and night sweats. Eyes:  Denies blurring. ENT:  Denies earache. CV:  Denies chest pain or discomfort. Resp:  Complains of cough. GI:  Denies abdominal pain, nausea, and vomiting. GU:  Denies discharge. MS:  Denies joint pain. Derm:   Denies lesion(s). Neuro:  Denies headaches. Psych:  Complains of mental problems; denies depression. Endo:  Denies excessive urination.  Physical Exam  General:  alert.   Head:  normocephalic.   Eyes:  pupils round.   Ears:  bil TM with minimal cerumen Nose:  no nasal discharge.   Mouth:  fair dentition.   Neck:  supple.   Chest Wall:  no mass.   Breasts:  no abnormal thickening.   Lungs:  normal breath sounds.   Heart:  normal rate and regular rhythm.   Abdomen:  normal bowel sounds.   Rectal:  no external abnormalities.   Msk:  normal ROM.   Pulses:  R radial normal and L radial normal.   Extremities:  no edema Neurologic:  alert & oriented X3.   Skin:  color normal.   Psych:  Oriented X3.    Pelvic Exam  Vulva:      normal appearance.   Urethra and Bladder:      Urethra--normal.  Bladder--normal.   Vagina:      physiologic discharge.   Cervix:      midposition.   Uterus:      smooth.   Adnexa:      nontender bilaterally.   Rectum:      heme negative stool.      Impression & Recommendations:  Problem # 1:  ROUTINE GYNECOLOGICAL EXAMINATION (ICD-V72.31) rec optho and dental  guaiac negative mammogram up to date self breast exam placcard given Orders: UA Dipstick w/o Micro (manual) (04540) T-Urine Microalbumin w/creat. ratio [id number]) T- GC Chlamydia (13086) KOH/ WET Mount [phone]) Pap Smear, Thin Prep ( Collection of) (N6295) T-Syphilis Test (RPR) (28413-24401) Hemoccult Guaiac-1 spec.(in office) (82270)  Problem # 2:  ESSENTIAL HYPERTENSION, BENIGN (ICD-401.1) will change ? pt has cough due to ACE will change to benicar The following medications were removed from the medication list:    Lisinopril-hydrochlorothiazide 20-25 Mg Tabs (Lisinopril-hydrochlorothiazide) ..... One tablet by mouth daily Her updated medication list for this problem includes:    Benicar Hct 20-12.5 Mg Tabs (Olmesartan medoxomil-hctz) ..... One tablet by mouth daily  for bloo pressure  Orders: T-Comprehensive Metabolic Panel [id number]) T-CBC w/Diff (03474-25956) Rapid HIV  (38756) T-TSH (43329-51884) T-Vitamin D 25-Hydroxy & 1,25 Dihydroxy (8147)  Problem # 3:  HOT FLASHES (ICD-627.2) pt may be going through perimenopause handout given  Problem # 4:  ANXIETY (ICD-300.00) pt likely needs more f/u  will start effexor The following medications were removed from the medication list:    Trazodone Hcl 50 Mg Tabs (Trazodone hcl) .Marland Kitchen... 1-2 tablets by mouth nightly for sleep Her updated medication list for this problem includes:  Alprazolam 0.25 Mg Tabs (Alprazolam) ..... One tablet by mouth daily as needed for anxiety    Lexapro 10 Mg Tabs (Escitalopram oxalate) ..... One tablet by mouth daily for mood    Effexor Xr 37.5 Mg Xr24h-cap (Venlafaxine hcl) ..... One tablet by mouth tablet for moods and anxiety  Complete Medication List: 1)  Alprazolam 0.25 Mg Tabs (Alprazolam) .... One tablet by mouth daily as needed for anxiety 2)  Valtrex 500 Mg Tabs (Valacyclovir hcl) .... Take 1 tablet by mouth every 12 hours for 3 days for outbreaks 3)  Imitrex 50 Mg Tabs (Sumatriptan succinate) .... Take 1 by mouth at start of headache.  may repeat in 2 hours if headache recurs.  no more than 2 in 24 hour period. 4)  Multivitamins Tabs (Multiple vitamin) .... Once daily 5)  Caltrate 600+d 600-400 Mg-unit Tabs (Calcium carbonate-vitamin d) .... Once daily 6)  Protonix 40 Mg Tbec (Pantoprazole sodium) .... Take 1 tablet by mouth once a day 7)  Loratadine 10 Mg Tabs (Loratadine) .Marland Kitchen.. 1 tab by mouth daily as needed allergies 8)  Flonase 50 Mcg/act Susp (Fluticasone propionate) .Marland Kitchen.. 1-2 sprays each nostril once daily 9)  Lexapro 10 Mg Tabs (Escitalopram oxalate) .... One tablet by mouth daily for mood 10)  Amitiza 8 Mcg Caps (Lubiprostone) .... One capsule by mouth two times a day 11)  Ventolin Hfa 108 (90 Base) Mcg/act Aers (Albuterol sulfate) .Marland Kitchen.. 1-2 puffs every 4-6  hours as needed 12)  Depo-provera 150 Mg/ml Susp (Medroxyprogesterone acetate) .... Injection once every 3 months for birth control 13)  Benicar Hct 20-12.5 Mg Tabs (Olmesartan medoxomil-hctz) .... One tablet by mouth daily for bloo pressure 14)  Effexor Xr 37.5 Mg Xr24h-cap (Venlafaxine hcl) .... One tablet by mouth tablet for moods and anxiety  Other Orders: T-FSH ([id number])  Hypertension Assessment/Plan:      The patient's hypertensive risk group is category B: At least one risk factor (excluding diabetes) with no target organ damage.  Today's blood pressure is 133/86.  Her blood pressure goal is < 140/90.   Patient Instructions: 1)  You need a tetanus on your next visit.  None avialable today in office. 2)  Read handout about dental clinic- this will be for routine dental cleaning 3)  Schedule fasting lab visit for lipids - no food after midnight before this visit   4)  Blood pressure has been changed to benicar.  A new prescription has been send to the pharmacy 5)  Mood - keep taking lexapro.  Will start effexor 37.5mg  daily.  This may help with focus and also with hot flashes.  Will start with low dose which may be increased if necessary. 6)  Follow up in 4 weeks for medications - effexor. Prescriptions: EFFEXOR XR 37.5 MG XR24H-CAP (VENLAFAXINE HCL) One tablet by mouth tablet for moods and anxiety  #30 x 5   Entered and Authorized by:   Lehman Prom FNP   Signed by:   Lehman Prom FNP on 06/16/2010   Method used:   Faxed to ...       [facility] (retail)       [address] [place], [place]  [address]       Ph: [phone]       Fax: [phone]   RxID:   [id number] BENICAR HCT 20-12.5 MG TABS (OLMESARTAN MEDOXOMIL-HCTZ) One tablet by mouth daily for bloo pressure  #30 x 11   Entered  and Authorized by:   Lehman Prom FNP   Signed by:   Lehman Prom FNP on 06/16/2010   Method used:   Faxed to ...        [facility] (retail)       [address] [place], [place]  [address]       Ph: [phone]       Fax: [phone]   RxID:   [id number]    Orders Added: 1)  Est. Patient age 35-64 [26] 2)  UA Dipstick w/o Micro (manual) [81002] 3)  T-Urine Microalbumin w/creat. ratio [[id number]] 4)  T- GC Chlamydia [25366] 5)  KOH/ WET Mount [87210] 6)  Pap Smear, Thin Prep ( Collection of) [Q0091] 7)  T-Comprehensive Metabolic Panel [80053-22900] 8)  T-CBC w/Diff [44034-74259] 9)  Rapid HIV  [92370] 10)  T-TSH [56387-56433] 11)  T-Vitamin D 25-Hydroxy & 1,25 Dihydroxy [8147] 12)  T-FSH [83001-23670] 13)  T-Syphilis Test (RPR) [29518-84166] 14)  Hemoccult Guaiac-1 spec.(in office) [82270]     EKG  Procedure date:  06/16/2010  Findings:      sinus rhythm   Prevention & Chronic Care Immunizations   Influenza vaccine: Fluvax 3+  (02/09/2010)    Tetanus booster: Not documented   Td booster deferral: Not available  (06/16/2010)    Pneumococcal vaccine: Not documented  Other Screening   Pap smear:  Specimen Adequacy: Satisfactory for evaluation.   Interpretation/Result:Negative for intraepithelial Lesion or Malignancy.     (06/24/2009)   Pap smear due: 07/2010    Mammogram: No specific mammographic evidence of malignancy.  Assessment: BIRADS 1.   (06/08/2010)   Mammogram action/deferral: Screening mammogram in 1 year.     (06/08/2010)   Mammogram due: 06/2011   Smoking status: current  (06/16/2010)  Lipids   Total Cholesterol: Not documented   LDL: Not documented   LDL Direct: Not documented   HDL: Not documented   Triglycerides: Not documented  Hypertension   Last Blood Pressure: 133 / 86  (06/16/2010)   Serum creatinine: 0.71  (01/17/2009)   Serum potassium 3.7  (01/17/2009) CMP ordered   Self-Management Support :    Hypertension self-management support: Not documented   Laboratory Results     [facility] Source: vaginal WBC/hpf: 1-5 Bacteria/hpf: rare Clue cells/hpf: none Yeast/hpf: none Wet Mount KOH: Negative Trichomonas/hpf: none  Stool - Occult Blood Date: 06/19/2010     Appended Document: Complete Physical Exam     Allergies: 1)  ! Pcn   Complete Medication List: 1)  Alprazolam 0.25 Mg Tabs (Alprazolam) .... One tablet by mouth daily as needed for anxiety 2)  Valtrex 500 Mg Tabs (Valacyclovir hcl) .... Take 1 tablet by mouth every 12 hours for 3 days for outbreaks 3)  Imitrex 50 Mg Tabs (Sumatriptan succinate) .... Take 1 by mouth at start of headache.  may repeat in 2 hours if headache recurs.  no more than 2 in 24 hour period. 4)  Multivitamins Tabs (Multiple vitamin) .... Once daily 5)  Caltrate 600+d 600-400 Mg-unit Tabs (Calcium carbonate-vitamin d) .... Once daily 6)  Protonix 40 Mg Tbec (Pantoprazole sodium) .... Take 1 tablet by mouth once a day 7)  Loratadine 10 Mg Tabs (Loratadine) .Marland Kitchen.. 1 tab by mouth daily as needed allergies 8)  Flonase 50 Mcg/act Susp (Fluticasone propionate) .Marland Kitchen.. 1-2 sprays each nostril once daily 9)  Lexapro 10 Mg Tabs (Escitalopram oxalate) .... One tablet by mouth daily for mood 10)  Amitiza 8 Mcg Caps (Lubiprostone) .... One capsule by mouth two times a day 11)  Ventolin Hfa 108 (90 Base) Mcg/act Aers (Albuterol sulfate) .Marland Kitchen.. 1-2 puffs every 4-6 hours as needed 12)  Depo-provera 150 Mg/ml Susp (Medroxyprogesterone acetate) .... Injection once every 3 months for birth control 13)  Benicar Hct 20-12.5 Mg Tabs (Olmesartan medoxomil-hctz) .... One tablet by mouth daily for bloo pressure 14)  Effexor Xr 37.5 Mg Xr24h-cap (Venlafaxine hcl) .... One tablet by mouth tablet for moods and anxiety      Laboratory Results   Urine Tests  Date/Time Received: June 19, 2010 2:20 PM   Routine Urinalysis   Color: lt. yellow Appearance: Clear Glucose: negative   (Normal Range: Negative) Bilirubin: negative   (Normal Range:  Negative) Ketone: negative   (Normal Range: Negative) Spec. Gravity: 1.015   (Normal Range: 1.003-1.035) Blood: negative   (Normal Range: Negative) pH: 7.0   (Normal Range: 5.0-8.0) Protein: negative   (Normal Range: Negative) Urobilinogen: 1.0   (Normal Range: 0-1) Nitrite: negative   (Normal Range: Negative) Leukocyte Esterace: negative   (Normal Range: Negative)    Date/Time Received: June 19, 2010 2:21 PM   Other Tests  Rapid HIV: negative

## 2010-06-27 NOTE — Progress Notes (Signed)
Summary: Depo and skin medications  Phone Note Call from Patient Call back at Home Phone [phone]   Caller: Patient Summary of Call: need rx for skin: after last physical Alclometasone, clindamycin; uses [facility].  Depo due on 4/12/has Dr's appt on 4/10 can she get Dept on this date to avoid making two trips...pem Initial call taken by: Ernestine Mcmurray,  June 19, 2010 8:17 AM  Follow-up for Phone Call        OV changed to 07/20/10 (date depo injection is due).  Wants skin Rx refilled.  Dutch Quint RN  June 19, 2010 1:04 PM   Additional Follow-up for Phone Call Additional follow up Details #1::        depo refilled and sent to pharmacy skin meds,...Marland Kitchenno record of me ever filling these requested meds for the pt did she get them from dermatology? if so, she can have them refill and i will co-sign so she can get them filled here at [facility] pharmacy Additional Follow-up by: Lehman Prom FNP,  June 19, 2010 2:08 PM    Additional Follow-up for Phone Call Additional follow up Details #2::    She does not remember where she had the original Rx filled, does not have labels.  She saw Dr. Terri Piedra, but he has no records of her under her present name.  I called [facility] on [address] since she thought she had had them filled there.  They had no record of them.  Her skin is breaking out and she needs the medications.  She would like to know if this is something you could prescribe for her if she came in for an OV, or would she have to go back to the dermatologist?  Dutch Quint RN  June 20, 2010 3:28 PM  I'm not familiar with medication as written in original note and will send clindamycin gel to use on her face to [facility] pharmacy for other parts of her body she needs to be sure to use soap for sensitive skin such as dove.  avoid scented lotions. n.martin,fnp June 21, 2010 12:50 PM'  Left message on voicemail for pt. to return call.  Dutch Quint RN  June 21, 2010 1:06  PM   Additional Follow-up for Phone Call Additional follow up Details #3:: Details for Additional Follow-up Action Taken: Pt. advised of provider's instructions and clindamycin refill - verbalized understanding and agreement.   Please send clindamycin Rx  to [facility].  Dutch Quint RN  June 21, 2010 4:46 PM   Prescriptions: DEPO-PROVERA 150 MG/ML SUSP (MEDROXYPROGESTERONE ACETATE) Injection once every 3 months for birth control  #1 x 3   Entered and Authorized by:   Lehman Prom FNP   Signed by:   Lehman Prom FNP on 06/19/2010   Method used:   Faxed to ...       [facility] (retail)       [address] [place], [place]  [address]       Ph: [phone]       Fax: [phone]   RxID:   [id number]   Appended Document: Depo and skin medications    Clinical Lists Changes  Medications: Added new medication of CLEOCIN-T 1 % GEL (CLINDAMYCIN PHOSPHATE) One application topically two times a day to face - Signed Rx of CLEOCIN-T 1 % GEL (CLINDAMYCIN PHOSPHATE) One application topically two times a day to face;  #1 month qs x 5;  Signed;  Entered by: Lehman Prom FNP;  Authorized by: Lehman Prom FNP;  Method used: Faxed to [facility], [address], [place], [place]  [address], Ph: [phone], Fax: [phone]    Prescriptions: CLEOCIN-T 1 % GEL (CLINDAMYCIN PHOSPHATE) One application topically two times a day to face  #1 month qs x 5   Entered and Authorized by:   Lehman Prom FNP   Signed by:   Lehman Prom FNP on 06/21/2010   Method used:   Faxed to ...       [facility] - [facility] (retail)       [address] [place], [place]  [address]       Ph: [phone]       Fax: [phone]   RxID:   [id number]

## 2010-06-27 NOTE — Progress Notes (Signed)
Summary: Office Visit//DEPRESSION SCREENING  Office Visit//DEPRESSION SCREENING   Imported By: Arta Bruce 06/19/2010 10:19:54  _____________________________________________________________________  External Attachment:    Type:   Image     Comment:   External Document

## 2010-07-16 LAB — POCT URINALYSIS DIP (DEVICE)
Glucose, UA: NEGATIVE mg/dL
Nitrite: NEGATIVE
pH: 7 (ref 5.0–8.0)

## 2010-07-16 LAB — URINE CULTURE

## 2010-07-17 LAB — WET PREP, GENITAL
Clue Cells Wet Prep HPF POC: NONE SEEN
Trich, Wet Prep: NONE SEEN
Yeast Wet Prep HPF POC: NONE SEEN

## 2010-07-17 LAB — POCT PREGNANCY, URINE: Preg Test, Ur: NEGATIVE

## 2010-07-17 LAB — HERPES SIMPLEX VIRUS CULTURE: Culture: NOT DETECTED

## 2010-07-17 LAB — POCT URINALYSIS DIP (DEVICE)
Bilirubin Urine: NEGATIVE
Glucose, UA: NEGATIVE mg/dL
Nitrite: NEGATIVE
Specific Gravity, Urine: 1.025 (ref 1.005–1.030)
Urobilinogen, UA: 0.2 mg/dL (ref 0.0–1.0)

## 2010-08-25 NOTE — Op Note (Signed)
NAME:  Bailey Hooper, Bailey Hooper                         ACCOUNT NO.:  [id number]   MEDICAL RECORD NO.:  [id number]                   PATIENT TYPE:  AMB   LOCATION:  SDC                                  FACILITY:  [facility]   PHYSICIAN:  Genia Del, M.D.             DATE OF BIRTH:  09/02/1965   DATE OF PROCEDURE:  12/09/2003  DATE OF DISCHARGE:                                 OPERATIVE REPORT   PREOPERATIVE DIAGNOSIS:  Blighted ovum, 6+ weeks.   POSTOPERATIVE DIAGNOSIS:  Blighted ovum, 6+ weeks.   INTERVENTION:  Dilatation and evacuation.   SURGEON:  Dr. Genia Del.   ANESTHESIOLOGIST:  Dr. Cline Crock.   DESCRIPTION OF PROCEDURE:  Under MAC analgesia the patient is in the  lithotomy position.  She is prepped with Hibiclens on the suprapubic,  vulvar, and vaginal areas and draped as usual.  The vaginal exam reveals an  anteverted uterus about 7 weeks, mobile, no adnexal mass.  The cervix is  closed, no bleeding.  The bladder was catheterized.  We then inserted the  speculum inside the vagina.  We proceeded with paracervical block at 4 and 8  o'clock with a total of 20 mL of Xylocaine 1%.  We then continue with a  cervical block with 10 mL total of Xylocaine 1%.  We then put a tenaculum on  the anterior lip of the cervix.  We proceed with dilatation of the cervix  with Hegar dilators up to #35 without difficulty.  We then use a #9 curved  suction curette and suction the intrauterine cavity.  Products of conception  corresponding to about 7 weeks are brought out and sent to pathology.  We  use the sharp curette to assure that the intrauterine cavity was emptied  completely.  We gently pass a sharp curette on all intrauterine surfaces.  No further products of conception are brought out.  We go back with the  suction to evacuate any blood clot.  We then remove the tenaculum on the  anterior lip of the cervix.  Hemostasis is adequate.  We removed the  speculum.  The vaginal exam  reveals a well-contracted uterus.  Estimated  blood loss was minimal.  No complication occurred and the patient was  transferred to recovery room in good status.  Her blood group is Rh  positive.  Note that the patient is going through a situational depression.  No suicidal ideation.  Prior to surgery, antidepressant therapy was  discussed.  Usage and risks were reviewed as well as side effects.  Zoloft  50 mg p.o. daily was prescribed.  The patient was encouraged to consult with  a psychiatrist and a psychotherapist.  Genia Del, M.D.    ML/MEDQ  D:  12/09/2003  T:  12/10/2003  Job:  [id number]

## 2011-01-28 ENCOUNTER — Ambulatory Visit (HOSPITAL_BASED_OUTPATIENT_CLINIC_OR_DEPARTMENT_OTHER): Payer: Self-pay | Attending: Internal Medicine

## 2011-01-28 DIAGNOSIS — G4733 Obstructive sleep apnea (adult) (pediatric): Secondary | ICD-10-CM | POA: Insufficient documentation

## 2011-02-03 DIAGNOSIS — R0989 Other specified symptoms and signs involving the circulatory and respiratory systems: Secondary | ICD-10-CM

## 2011-02-03 DIAGNOSIS — G4733 Obstructive sleep apnea (adult) (pediatric): Secondary | ICD-10-CM

## 2011-02-03 DIAGNOSIS — R0609 Other forms of dyspnea: Secondary | ICD-10-CM

## 2011-02-03 NOTE — Procedures (Signed)
NAMEJOSSIE, SMOOT                  ACCOUNT NO.:  [id number]  MEDICAL RECORD NO.:  [id number]          PATIENT TYPE:  OUT  LOCATION:  SLEEP CENTER                 FACILITY[facility]  [facility]  PHYSICIAN:  Clinton D. Maple Hudson, MD, FCCP, FACPDATE OF BIRTH:  07-12-65  DATE OF STUDY:  01/28/2011                           NOCTURNAL POLYSOMNOGRAM  REFERRING PHYSICIAN:  Tresa Endo L. Philipp Deputy, M.D.  REFERRING PHYSICIAN:  Tresa Endo L. Vollmer, MD.  INDICATION FOR STUDY:  Hypersomnia with sleep apnea.  EPWORTH SLEEPINESS SCORE:  14/24.  BMI 30.7, weight 196 pounds, height 67 inches, neck 14 inches.  MEDICATIONS:  Home medications are charted and reviewed.  SLEEP ARCHITECTURE:  Total sleep time 433 minutes with sleep efficiency 97%.  Stage I 5.7%, stage II 75.8%, stage III 7%, REM 11.5% of total sleep time.  Sleep latency 3.5 minutes.  REM latency 121.5 minutes. Awake after sleep onset 9.5 minutes.  Arousal index 15.2.  BEDTIME MEDICATION:  None.  RESPIRATORY DATA:  Apnea-hypopnea index (AHI) 6.7 per hour.  A total of 48 events was scored including 33 obstructive apneas, 1 central apnea, 2 mixed apneas, 12 hypopneas.  All events were associated with supine sleep position and REM.  REM AHI 25.2 per hour.  This is a diagnostic NPSG protocol as ordered.  OXYGEN DATA:  Moderate snoring with oxygen desaturation to a nadir of 84% and a mean oxygen saturation through the study of 95.6% on room air.  CARDIAC DATA:  Normal sinus rhythm.  MOVEMENT-PARASOMNIA:  Occasional limb jerk with arousal.  A total of 4 limb jerks were counted of which 2 were associated with arousals or awakening for periodic limb movement with arousal index of 0.3 per hour. No bathroom trips.  IMPRESSIONS-RECOMMENDATIONS: 1. Mild obstructive sleep apnea/hypopnea syndrome, AHI 6.7 per hour.     Events were with supine sleep position and REM.  REM AHI 25.2 per     hour.  Moderate snoring with oxygen desaturation to a nadir of 84%     and a  mean oxygen saturation through the study of 95.6% on room     air. 2. Scores in this range may respond well to conservative treatment     including encouragement to sleep off flat of back, weight loss, and     treatment for nasal congestion.  CPAP can be used if other measures     are not successful.     Clinton D. Maple Hudson, MD, [facility], FACP Diplomate, Biomedical engineer of Sleep Medicine Electronically Signed    CDY/MEDQ  D:  02/03/2011 09:55:33  T:  02/03/2011 10:12:38  Job:  [id number]

## 2011-05-09 ENCOUNTER — Other Ambulatory Visit: Payer: Self-pay | Admitting: Internal Medicine

## 2011-05-09 DIAGNOSIS — Z1231 Encounter for screening mammogram for malignant neoplasm of breast: Secondary | ICD-10-CM

## 2011-06-03 ENCOUNTER — Ambulatory Visit (HOSPITAL_BASED_OUTPATIENT_CLINIC_OR_DEPARTMENT_OTHER): Payer: Self-pay | Attending: Internal Medicine | Admitting: General Practice

## 2011-06-03 DIAGNOSIS — G4710 Hypersomnia, unspecified: Secondary | ICD-10-CM | POA: Insufficient documentation

## 2011-06-03 DIAGNOSIS — G4730 Sleep apnea, unspecified: Secondary | ICD-10-CM | POA: Insufficient documentation

## 2011-06-09 DIAGNOSIS — G4730 Sleep apnea, unspecified: Secondary | ICD-10-CM

## 2011-06-09 DIAGNOSIS — G4710 Hypersomnia, unspecified: Secondary | ICD-10-CM

## 2011-06-09 DIAGNOSIS — G4761 Periodic limb movement disorder: Secondary | ICD-10-CM

## 2011-06-09 NOTE — Procedures (Signed)
NAMECHAKARA, BOGNAR                  ACCOUNT NO.:  [id number]  MEDICAL RECORD NO.:  [id number]          PATIENT TYPE:  OUT  LOCATION:  SLEEP CENTER                 FACILITY:  [facility]  PHYSICIAN:  Nakira Litzau D. Maple Hudson, MD, FCCP, FACPDATE OF BIRTH:  09/28/65  DATE OF STUDY:  06/03/2011                           NOCTURNAL POLYSOMNOGRAM  REFERRING PHYSICIAN:  Tresa Endo L. Philipp Deputy, M.D.  REFERRING PHYSICIAN:  Tresa Endo L. Vollmer, MD  INDICATION FOR STUDY:  Hypersomnia with sleep apnea.  EPWORTH SLEEPINESS SCORE:  5/24.  BMI 31.3, weight 194 pounds, height 66 inches, neck 14 inches.  MEDICATIONS:  Home medications are charted and reviewed.  A baseline diagnostic NPSG on January 28, 2011, recorded OSA with an apnea-hypopnea index of 6.7 per hour.  Epworth sleepiness score was 14/24 and weight 196 pounds.  CPAP titration is now requested.  SLEEP ARCHITECTURE:  Total sleep time 319 minutes with sleep efficiency 77.1%.  Stage I was 3.9%, stage II 80.7%, stage III absent, REM 15.4% of total sleep time.  Sleep latency 71 minutes, REM latency 64 minutes, awake after sleep onset 24 minutes, arousal index 7.5.  BEDTIME MEDICATION:  None.  RESPIRATORY DATA:  CPAP titration protocol.  CPAP was titrated to 10 CWP, AHI 1.9 per hour.  She wore a Banker Pilairo mask with heated humidifier and a C-Flex setting of 3.  OXYGEN DATA:  Moderately loud snoring at the beginning was prevented at final CPAP pressures, and mean oxygen saturation held 97.5% on room air.  CARDIAC DATA:  Normal sinus rhythm.  MOVEMENT-PARASOMNIA:  A total of 60 limb jerks were counted, of which 4 were associated with arousals or awakenings for periodic limb movement with arousal index of 0.8 per hour.  Bathroom x1.  IMPRESSIONS-RECOMMENDATIONS: 1. Successful CPAP titration to 10 CWP, AHI 1.9 per hour.  She used a     Oceanographer mask with heated humidifier and a C-Flex     setting of 3.  Snoring was prevented  and mean oxygen saturation of     97.5% on room air. 2. Baseline diagnostic NPSG on January 28, 2011, recorded AHI of 6.7     per hour with an Epworth sleepiness Score of 14/24, and weighed 196     pounds for that study.     Georgianne Gritz D. Maple Hudson, MD, [facility], FACP Diplomate, American Board of Sleep Medicine    CDY/MEDQ  D:  06/09/2011 14:53:37  T:  06/09/2011 23:48:51  Job:  [id number]

## 2011-06-11 ENCOUNTER — Ambulatory Visit (HOSPITAL_COMMUNITY)
Admission: RE | Admit: 2011-06-11 | Discharge: 2011-06-11 | Disposition: A | Discharge status: Home / Self Care | Payer: Self-pay | Source: Ambulatory Visit | Attending: Internal Medicine | Admitting: Internal Medicine

## 2011-06-11 DIAGNOSIS — Z1231 Encounter for screening mammogram for malignant neoplasm of breast: Secondary | ICD-10-CM | POA: Insufficient documentation

## 2011-06-16 ENCOUNTER — Emergency Department (HOSPITAL_COMMUNITY)
Admission: EM | Admit: 2011-06-16 | Discharge: 2011-06-16 | Disposition: A | Discharge status: Home / Self Care | Payer: Self-pay | Source: Home / Self Care | Attending: Emergency Medicine | Admitting: Emergency Medicine

## 2011-06-16 ENCOUNTER — Encounter (HOSPITAL_COMMUNITY): Payer: Self-pay | Admitting: Emergency Medicine

## 2011-06-16 DIAGNOSIS — R42 Dizziness and giddiness: Secondary | ICD-10-CM

## 2011-06-16 HISTORY — DX: Gastro-esophageal reflux disease without esophagitis: K21.9

## 2011-06-16 HISTORY — DX: Anxiety disorder, unspecified: F41.9

## 2011-06-16 HISTORY — DX: Essential (primary) hypertension: I10

## 2011-06-16 MED ORDER — DIAZEPAM 5 MG/ML IJ SOLN
INTRAMUSCULAR | Status: AC
  Filled 2011-06-16: qty 2

## 2011-06-16 MED ORDER — MECLIZINE HCL 25 MG PO TABS: 25.0000 mg | ORAL_TABLET | Freq: Three times a day (TID) | ORAL | Status: AC | PRN

## 2011-06-16 MED ORDER — ONDANSETRON HCL 4 MG PO TABS: 8.0000 mg | ORAL_TABLET | Freq: Three times a day (TID) | ORAL | Status: AC | PRN

## 2011-06-16 MED ORDER — DIAZEPAM 5 MG/ML IJ SOLN
2.5000 mg | Freq: Once | INTRAMUSCULAR | Status: AC
  Administered 2011-06-16: 2.5 mg via INTRAMUSCULAR

## 2011-06-16 NOTE — ED Notes (Signed)
Patient's visitor came out of room requesting to speak to her nurse.  She stated she had several questions.  She wanted to know what vertigo was caused by and wanted to know what we were going to do about patient's blood pressure.  I told her I would inform patient's provider.  Esperanza Sheets, PA made aware

## 2011-06-16 NOTE — ED Notes (Signed)
Brought pt to room for quick evaluation; pt c/o dizziness, vitals were assessed; orthostatic vitals were attempted, but pt was unable to tolerate horizontal position; pt began to c/o severe dizziness and began vomiting.

## 2011-06-16 NOTE — ED Provider Notes (Signed)
History     CSN: [id number]  Arrival date & time 06/16/11  1121   First MD Initiated Contact with Patient 06/16/11 1141      Chief Complaint  Patient presents with  . Dizziness    (Consider location/radiation/quality/duration/timing/severity/associated sxs/prior treatment) HPI Comments: Patient presents today with complaints of dizziness nausea and vomiting. She states that she awoke at 7 AM this morning with symptoms. Dizziness worsens with movement of her head or changes positions. She feels nauseous and when the dizziness worsens nausea worsens and she vomits. She denies headache, chest pain, palpitations, or paresthesias. No head injury. She has recently had URI symptoms.  When she awoke with symptoms this morning her daughter called EMS. Reportedly vitals were OK. Symptoms seemed to improve temporarily. She drove herself here to Urgent Care and then symptoms worsened again after arriving to waiting room. Pt states she took a dose of Phenergan at home at 10 am this morning.    Past Medical History  Diagnosis Date  . Hypertension   . GERD (gastroesophageal reflux disease)   . Anxiety     Past Surgical History  Procedure Date  . Foot surgery     History reviewed. No pertinent family history.  History  Substance Use Topics  . Smoking status: Current Everyday Smoker  . Smokeless tobacco: Not on file  . Alcohol Use: No    OB History    Grav Para Term Preterm Abortions TAB SAB Ect Mult Living                  Review of Systems  Constitutional: Negative for fever and chills.  HENT: Positive for congestion. Negative for ear pain, sore throat, sinus pressure and tinnitus.   Respiratory: Positive for shortness of breath. Negative for cough and wheezing.   Cardiovascular: Negative for chest pain and palpitations.  Gastrointestinal: Positive for nausea and vomiting. Negative for abdominal pain and diarrhea.  Neurological: Positive for dizziness. Negative for weakness and  headaches.    Allergies  Penicillins  Home Medications   Current Outpatient Rx  Name Route Sig Dispense Refill  . ESCITALOPRAM OXALATE 10 MG PO TABS Oral Take 10 mg by mouth daily.    Marland Kitchen HYDROCHLOROTHIAZIDE 25 MG PO TABS Oral Take 25 mg by mouth daily.    . AMITIZA PO Oral Take by mouth.    Marland Kitchen PROTONIX PO Oral Take by mouth.    Marland Kitchen PROMETHAZINE HCL 25 MG PO TABS Oral Take 25 mg by mouth every 6 (six) hours as needed.    . TRAZAMINE 50 MG PO MISC Oral Take by mouth.    . VENLAFAXINE HCL 37.5 MG PO TABS Oral Take 37.5 mg by mouth 2 (two) times daily.    Marland Kitchen MECLIZINE HCL 25 MG PO TABS Oral Take 1 tablet (25 mg total) by mouth every 8 (eight) hours as needed for dizziness. 30 tablet 0  . ONDANSETRON HCL 4 MG PO TABS Oral Take 2 tablets (8 mg total) by mouth every 8 (eight) hours as needed for nausea. 12 tablet 0    BP 146/94  Pulse 75  Temp(Src) 98.8 F (37.1 C) (Oral)  Resp 17  SpO2 100%  Physical Exam  Nursing note and vitals reviewed. Constitutional: She appears well-developed and well-nourished. No distress.  HENT:  Head: Normocephalic and atraumatic.  Right Ear: Tympanic membrane, external ear and ear canal normal.  Left Ear: Tympanic membrane, external ear and ear canal normal.  Nose: Nose normal.  Mouth/Throat: Uvula is  midline, oropharynx is clear and moist and mucous membranes are normal. No oropharyngeal exudate, posterior oropharyngeal edema or posterior oropharyngeal erythema.  Eyes: Conjunctivae and lids are normal. Pupils are equal, round, and reactive to light. Right eye exhibits nystagmus. Left eye exhibits nystagmus.  Fundoscopic exam:      The right eye shows no hemorrhage and no papilledema.       The left eye shows no hemorrhage and no papilledema.       Bilat horizontal nystagmus noted with EOM.  Neck: Normal range of motion. Neck supple.  Cardiovascular: Normal rate, regular rhythm and normal heart sounds.   Pulmonary/Chest: Effort normal and breath sounds  normal. No respiratory distress.  Abdominal: Normal appearance. She exhibits no mass. There is no tenderness.  Lymphadenopathy:    She has no cervical adenopathy.  Neurological: She is alert.  Skin: Skin is warm and dry.  Psychiatric: She has a normal mood and affect.    ED Course  Procedures (including critical care time)  Labs Reviewed - No data to display No results found.   1. Vertigo       MDM  Dizziness significantly improved after IM Valium.         Melody Comas, [place] 06/16/11 1306

## 2011-06-16 NOTE — Discharge Instructions (Signed)
Increase clear fluids. Avoid caffeinated beverages. I recommend water and ginger ale. Eat a light bland diet until your nausea and vomiting improve. Begin Zofran prescription 6 hrs after your last dose of Phenergan. Begin Meclizine at any time as needed for dizziness. If your symptoms are not improving in 2-3 days follow up with Dr Daphine Deutscher. Do not drive today due to the medication you received. You should not drive until the dizziness improves.

## 2011-06-16 NOTE — ED Notes (Signed)
C/o sob, sweating, dizziness.  Reports not feeling well yesterday, nonspecific complaints.  This am woke with severe dizziness and nausea, vomiting.  Daughter called an ambulance to the resident, told bp high, declined transport.  Patient reports dizziness improved.  Drove self here.  While in treatment room for orthostatics, when positioned flat by emt, patient immediately sat up, wrenching, anxious, saying no to orthostatics, unable to tplerate position.

## 2011-06-18 NOTE — ED Provider Notes (Signed)
Medical screening examination/treatment/procedure(s) were performed by non-physician practitioner and as supervising physician I was immediately available for consultation/collaboration.  Alen Bleacher, MD 06/18/11 2138

## 2011-09-11 ENCOUNTER — Encounter (HOSPITAL_COMMUNITY): Payer: Self-pay

## 2011-09-11 ENCOUNTER — Emergency Department (INDEPENDENT_AMBULATORY_CARE_PROVIDER_SITE_OTHER)
Admission: EM | Admit: 2011-09-11 | Discharge: 2011-09-11 | Disposition: A | Discharge status: Home / Self Care | Payer: Self-pay | Source: Home / Self Care | Attending: Emergency Medicine | Admitting: Emergency Medicine

## 2011-09-11 DIAGNOSIS — H8110 Benign paroxysmal vertigo, unspecified ear: Secondary | ICD-10-CM

## 2011-09-11 DIAGNOSIS — M5412 Radiculopathy, cervical region: Secondary | ICD-10-CM

## 2011-09-11 MED ORDER — MECLIZINE HCL 25 MG PO TABS: 12.5000 mg | ORAL_TABLET | Freq: Three times a day (TID) | ORAL | Status: AC | PRN

## 2011-09-11 NOTE — ED Provider Notes (Signed)
Chief Complaint  Patient presents with  . Dizziness    History of Present Illness:  Bailey Hooper is a 46 year old female who presents today with vertigo and tingling in her left arm.  The vertigo has been going on since March. She describes spinning of the surroundings. This is intermittent and positional and sometimes associated with nausea and vomiting. At onset she came [facility] and was given Valium and meclizine which did seem to help temporarily, but then the symptoms came back again but not quite as severe. She went to see her primary care physician, Dr. Philipp Deputy, at [facility] she was diagnosed as having an ear infection and given doxycycline, but this didn't help much. She still dizzy. The episodes last for minutes and are positional. She denies any difficulty hearing, ringing in the ears, or ear pain. She has had some mild springtime allergy symptoms with nasal congestion, rhinorrhea, sneezing, sinus pressure, and popping of her ears. She denies any diplopia, blurred vision, difficulty with speech, although she did have some difficulty with swallowing, mainly a sensation of a lump in her throat. She's had no difficulty walking although she does feel a little unsteady on her feet because of the vertigo.  Her other complaint has been tingling of her left arm is been going on for the past week this comes and goes and lasts a minute. There is nothing particular that brings it on but it would not brought on by use of the arm or the hand and the arm feels weak. She denies any neck pain, chest pain, tightness, pressure, or shortness of breath. There's been no swelling of the arm.  Review of Systems:  Other than noted above, the patient denies any of the following symptoms: Systemic:  No fever, chills, fatigue, or weight loss. Eye:  No blurred vision, visual change or diplopia. ENT:  No ear pain, tinnitus, hearing loss, nasal congestion, or rhinorrhea. Cardiac:  No chest pain,  dyspnea, palpitations or syncope. Neuro:  No headache, paresthesias, weakness, trouble with speech, coordination or ambulation.  PMFSH:  Past medical history, family history, social history, meds, and allergies were reviewed.  Physical Exam:   Vital signs:  BP 132/97  Pulse 69 Filed Vitals:   09/11/11 1307 Supine  09/11/11 1308 Sitting  09/11/11 1309 Standing   BP: 140/92 146/95 132/97  Pulse: 66 62 69   General:  Alert, oriented times 3, in no distress. Eye:  PERRL, full EOM, no nystagmus. ENT:  TMs and canals normal.  Nasal mucosa normal.  Pharynx clear. Neck:  No adenopathy, tenderness, or mass.  Thyroid normal.  No carotid bruit. Lungs:  Breath sounds clear and equal bilaterally.  No wheezes, rales or rhonchi. Heart:  Regular rhythm.  No gallops, murmers, or rubs. Neuro:  Alert and oriented times 3.  Cranial nerves intact.  No pronator drift.  Finger to nose normal.  No focal weakness.  Sensation intact to light touch.  Romberg's sign negative, gait normal.  Able to do tandem gait well.  Dix-Hallpike maneuver was extremely positive with the right ear down.   Date: 09/11/2011  Rate: 66  Rhythm: normal sinus rhythm  QRS Axis: normal  Intervals: normal  ST/T Wave abnormalities: normal  Conduction Disutrbances:none  Narrative Interpretation: Normal sinus rhythm, normal EKG  Old EKG Reviewed: none available  Assessment:  The primary encounter diagnosis was Benign positional vertigo. A diagnosis of Cervical radicular pain was also pertinent to this visit.  Plan:   1.  The following meds were prescribed:   New Prescriptions   MECLIZINE (ANTIVERT) 25 MG TABLET    Take 0.5 tablets (12.5 mg total) by mouth 3 (three) times daily as needed.   2.  The patient was instructed in symptomatic care and handouts were given. 3.  The patient was told to return if becoming worse in any way, if no better in 3 or 4 days, and given some red flag symptoms that would indicate earlier return. It  was suggested that she do Epley exercises for the vertigo and if no better in a week, followup with Dr. Suszanne Conners. For the numbness and tingling in the arm and radicular pain, I suggested that she followup with Dr. Porfirio Mylar Dohmeier.    Reuben Likes, MD 09/11/11 [phone]

## 2011-09-11 NOTE — Discharge Instructions (Signed)
Cervical Radiculopathy Cervical radiculopathy happens when a nerve in the neck is pinched or bruised by a slipped (herniated) disk or by arthritic changes in the bones of the cervical spine. This can occur due to an injury or as part of the normal aging process. Pressure on the cervical nerves can cause pain or numbness that runs from your neck all the way down into your arm and fingers. CAUSES  There are many possible causes, including:  Injury.   Muscle tightness in the neck from overuse.   Swollen, painful joints (arthritis).   Breakdown or degeneration in the bones and joints of the spine (spondylosis) due to aging.   Bone spurs that may develop near the cervical nerves.  SYMPTOMS  Symptoms include pain, weakness, or numbness in the affected arm and hand. Pain can be severe or irritating. Symptoms may be worse when extending or turning the neck. DIAGNOSIS  Your caregiver will ask about your symptoms and do a physical exam. He or she may test your strength and reflexes. X-rays, CT scans, and MRI scans may be needed in cases of injury or if the symptoms do not go away after a period of time. Electromyography (EMG) or nerve conduction testing may be done to study how your nerves and muscles are working. TREATMENT  Your caregiver may recommend certain exercises to help relieve your symptoms. Cervical radiculopathy can, and often does, get better with time and treatment. If your problems continue, treatment options may include:  Wearing a soft collar for short periods of time.   Physical therapy to strengthen the neck muscles.   Medicines, such as nonsteroidal anti-inflammatory drugs (NSAIDs), oral corticosteroids, or spinal injections.   Surgery. Different types of surgery may be done depending on the cause of your problems.  HOME CARE INSTRUCTIONS   Put ice on the affected area.   Put ice in a plastic bag.   Place a towel between your skin and the bag.   Leave the ice on for 15  to 20 minutes, 3 to 4 times a day or as directed by your caregiver.   Use a flat pillow when you sleep.   Only take over-the-counter or prescription medicines for pain, discomfort, or fever as directed by your caregiver.   If physical therapy was prescribed, follow your caregiver's directions.   If a soft collar was prescribed, use it as directed.  SEEK IMMEDIATE MEDICAL CARE IF:   Your pain gets much worse and cannot be controlled with medicines.   You have weakness or numbness in your hand, arm, face, or leg.   You have a high fever or a stiff, rigid neck.   You lose bowel or bladder control (incontinence).   You have trouble with walking, balance, or speaking.  MAKE SURE YOU:   Understand these instructions.   Will watch your condition.   Will get help right away if you are not doing well or get worse.  Document Released: 12/19/2000 Document Revised: 03/15/2011 Document Reviewed: 11/07/2010 [facility] Patient Information 2012 [place], [place].   Dizziness Dizziness is a common problem. It is a feeling of unsteadiness or lightheadedness. You may feel like you are about to faint. Dizziness can lead to injury if you stumble or fall. A person of any age group can suffer from dizziness, but dizziness is more common in older adults. CAUSES  Dizziness can be caused by many different things, including:  Middle ear problems.   Standing for too long.   Infections.   An  allergic reaction.   Aging.   An emotional response to something, such as the sight of blood.   Side effects of medicines.   Fatigue.   Problems with circulation or blood pressure.   Excess use of alcohol, medicines, or illegal drug use.   Breathing too fast (hyperventilation).   An arrhythmia or problems with your heart rhythm.   Low red blood cell count (anemia).   Pregnancy.   Vomiting, diarrhea, fever, or other illnesses that cause dehydration.   Diseases or conditions such as Parkinson's  disease, high blood pressure (hypertension), diabetes, and thyroid problems.   Exposure to extreme heat.  DIAGNOSIS  To find the cause of your dizziness, your caregiver may do a physical exam, lab tests, radiologic imaging scans, or an electrocardiography test (ECG).  TREATMENT  Treatment of dizziness depends on the cause of your symptoms and can vary greatly. HOME CARE INSTRUCTIONS   Drink enough fluids to keep your urine clear or pale yellow. This is especially important in very hot weather. In the elderly, it is also important in cold weather.   If your dizziness is caused by medicines, take them exactly as directed. When taking blood pressure medicines, it is especially important to get up slowly.   Rise slowly from chairs and steady yourself until you feel okay.   In the morning, first sit up on the side of the bed. When this seems okay, stand slowly while holding onto something until you know your balance is fine.   If you need to stand in one place for a long time, be sure to move your legs often. Tighten and relax the muscles in your legs while standing.   If dizziness continues to be a problem, have someone stay with you for a day or two. Do this until you feel you are well enough to stay alone. Have the person call your caregiver if he or she notices changes in you that are concerning.   Do not drive or use heavy machinery if you feel dizzy.  SEEK IMMEDIATE MEDICAL CARE IF:   Your dizziness or lightheadedness gets worse.   You feel nauseous or vomit.   You develop problems with talking, walking, weakness, or using your arms, hands, or legs.   You are not thinking clearly or you have difficulty forming sentences. It may take a friend or family member to determine if your thinking is normal.   You develop chest pain, abdominal pain, shortness of breath, or sweating.   Your vision changes.   You notice any bleeding.   You have side effects from medicine that seems to be  getting worse rather than better.  MAKE SURE YOU:   Understand these instructions.   Will watch your condition.   Will get help right away if you are not doing well or get worse.  Document Released: 09/19/2000 Document Revised: 03/15/2011 Document Reviewed: 10/13/2010 [facility] Patient Information 2012 [place], [place].

## 2011-09-11 NOTE — ED Notes (Signed)
C/o numbness and tingling in left arm for at least 1 week, vertigo off and on since March; pain left proximal clavicle area off and on past couple of days; denies pain at present w/d/color good; had been using splint on arm

## 2011-09-18 ENCOUNTER — Emergency Department (HOSPITAL_COMMUNITY)
Admission: EM | Admit: 2011-09-18 | Discharge: 2011-09-18 | Disposition: A | Discharge status: Home / Self Care | Payer: Self-pay | Attending: Emergency Medicine | Admitting: Emergency Medicine

## 2011-09-18 ENCOUNTER — Encounter (HOSPITAL_COMMUNITY): Payer: Self-pay | Admitting: *Deleted

## 2011-09-18 ENCOUNTER — Emergency Department (HOSPITAL_COMMUNITY): Payer: Self-pay

## 2011-09-18 DIAGNOSIS — M5030 Other cervical disc degeneration, unspecified cervical region: Secondary | ICD-10-CM | POA: Insufficient documentation

## 2011-09-18 DIAGNOSIS — R209 Unspecified disturbances of skin sensation: Secondary | ICD-10-CM | POA: Insufficient documentation

## 2011-09-18 DIAGNOSIS — Z79899 Other long term (current) drug therapy: Secondary | ICD-10-CM | POA: Insufficient documentation

## 2011-09-18 DIAGNOSIS — I10 Essential (primary) hypertension: Secondary | ICD-10-CM | POA: Insufficient documentation

## 2011-09-18 DIAGNOSIS — M5412 Radiculopathy, cervical region: Secondary | ICD-10-CM

## 2011-09-18 DIAGNOSIS — F411 Generalized anxiety disorder: Secondary | ICD-10-CM | POA: Insufficient documentation

## 2011-09-18 DIAGNOSIS — F17200 Nicotine dependence, unspecified, uncomplicated: Secondary | ICD-10-CM | POA: Insufficient documentation

## 2011-09-18 DIAGNOSIS — K219 Gastro-esophageal reflux disease without esophagitis: Secondary | ICD-10-CM | POA: Insufficient documentation

## 2011-09-18 MED ORDER — PREDNISONE (PAK) 10 MG PO TABS: 10.0000 mg | ORAL_TABLET | Freq: Every day | ORAL | Status: AC

## 2011-09-18 MED ORDER — OXYCODONE-ACETAMINOPHEN 5-325 MG PO TABS: 1.0000 | ORAL_TABLET | Freq: Four times a day (QID) | ORAL | Status: AC | PRN

## 2011-09-18 MED ORDER — CYCLOBENZAPRINE HCL 10 MG PO TABS: 10.0000 mg | ORAL_TABLET | Freq: Three times a day (TID) | ORAL | Status: AC | PRN

## 2011-09-18 MED ORDER — IBUPROFEN 800 MG PO TABS: 800.0000 mg | ORAL_TABLET | Freq: Three times a day (TID) | ORAL | Status: AC

## 2011-09-18 NOTE — ED Provider Notes (Signed)
Medical screening examination/treatment/procedure(s) were performed by non-physician practitioner and as supervising physician I was immediately available for consultation/collaboration.   Lyanne Co, MD 09/18/11 1242

## 2011-09-18 NOTE — ED Provider Notes (Addendum)
History     CSN: [id number]  Arrival date & time 09/18/11  1005   First MD Initiated Contact with Patient 09/18/11 1042      Chief Complaint  Patient presents with  . Numbness    left arm numbness    (Consider location/radiation/quality/duration/timing/severity/associated sxs/prior treatment) HPI Comments: Patient with a history of hypertension presents emergency department with chief complaint of left arm numbness and tingling.  Symptoms began about 2 weeks ago and patient has been controlling pain with ibuprofen however this only worked minimally.  Pain is intermittent lasting a minute.  Patient is not aware of any exacerbating or alleviating symptoms.  Patient was evaluated by urgent care for similar symptoms and advised to followup with neurosurgery, however patient has been unable to followup because neurosurgeons do not except the orange card.  Patient will be referred to [facility] for followup.  She denies fevers, night sweats, chills, extremity weakness, low back pain, ataxia, cervical trauma, IV drug use, cancer history, or other reason to be immunocompromised. Patient has no other complaints at this time.  The history is provided by the patient.    Past Medical History  Diagnosis Date  . Hypertension   . GERD (gastroesophageal reflux disease)   . Anxiety     Past Surgical History  Procedure Date  . Foot surgery     No family history on file.  History  Substance Use Topics  . Smoking status: Current Everyday Smoker  . Smokeless tobacco: Not on file  . Alcohol Use: No    OB History    Grav Para Term Preterm Abortions TAB SAB Ect Mult Living                  Review of Systems  All other systems reviewed and are negative.    Allergies  Penicillins  Home Medications   Current Outpatient Rx  Name Route Sig Dispense Refill  . ALBUTEROL SULFATE HFA 108 (90 BASE) MCG/ACT IN AERS Inhalation Inhale 2 puffs into the lungs every 6 (six) hours as needed.    Marland Kitchen  CLONIDINE HCL 0.1 MG PO TABS Oral Take 0.1 mg by mouth daily.     Marland Kitchen ESCITALOPRAM OXALATE 10 MG PO TABS Oral Take 10 mg by mouth daily.    Marland Kitchen HYDROCHLOROTHIAZIDE 25 MG PO TABS Oral Take 25 mg by mouth daily.    . LUBIPROSTONE 8 MCG PO CAPS Oral Take 8 mcg by mouth 2 (two) times daily with a meal.    . MECLIZINE HCL 25 MG PO TABS Oral Take 0.5 tablets (12.5 mg total) by mouth 3 (three) times daily as needed. 30 tablet 0  . PANTOPRAZOLE SODIUM 40 MG PO TBEC Oral Take 40 mg by mouth daily.    Marland Kitchen PROMETHAZINE HCL 25 MG PO TABS Oral Take 25 mg by mouth every 6 (six) hours as needed. Nausea      BP 127/92  Pulse 89  Temp(Src) 99.2 F (37.3 C) (Oral)  Resp 18  SpO2 98%  Physical Exam  Nursing note and vitals reviewed. Constitutional: She is oriented to person, place, and time. She appears well-developed and well-nourished. No distress.  HENT:  Head: Normocephalic and atraumatic.  Eyes: Conjunctivae and EOM are normal.  Neck: Normal range of motion. No rigidity.       No bony ttp or step offs. Normal pain free flexion and extension.   Pulmonary/Chest: Effort normal.  Musculoskeletal: Normal range of motion.       NO pain  w shoulder ROM both active or passive. Negative Phalen and Tinel.   Neurological: She is alert and oriented to person, place, and time. She has normal strength. No sensory deficit.  Reflex Scores:      Bicep reflexes are 2+ on the left side.      Brachioradialis reflexes are 2+ on the left side. Skin: Skin is warm and dry. No rash noted. She is not diaphoretic.  Psychiatric: She has a normal mood and affect. Her behavior is normal.    ED Course  Procedures (including critical care time)  Labs Reviewed - No data to display Dg Cervical Spine Complete  09/18/2011  *RADIOLOGY REPORT*  Clinical Data: Numbness in left arm from shoulder to fingertips  CERVICAL SPINE - COMPLETE 4+ VIEW  Comparison: None.  Findings: Osseous mineralization grossly normal. Slight disc space  narrowing with anterior spur formation at C5-C6 and C6-C7. Reversal of cervical lordosis question muscle spasm. Prevertebral soft tissues normal thickness. Long calcified left stylohyoid ligament. Vertebral body and disc space heights otherwise maintained. No acute fracture, subluxation or bone destruction. Bony foramina patent. C1-C2 alignment normal.  IMPRESSION: Mild degenerative disc disease changes C5-C6 and C6-C7. Question muscle spasm. No acute osseous abnormalities.  Original Report Authenticated By: Lollie Marrow, M.D.     No diagnosis found.   Date: 10/10/2011  Rate: 66  Rhythm: normal sinus rhythm  QRS Axis: normal  Intervals: normal  ST/T Wave abnormalities: normal  Conduction Disutrbances: none  Narrative Interpretation:   Old EKG Reviewed: No significant changes noted     MDM  Cervical radiculopathy   Imaging reviewed, the patient with degenerative disc changes C5-C7.  Results discussed with patient Patient has been discharged with anti-inflammatories, 5 day prednisone burst, and Flexeril.  Patient advised to followup with neurosurgery in regards to symptoms and states it.  Patient verbalizes understanding.       Jaci Carrel, PA-C 09/18/11 [address], PA-C 10/10/11 1510

## 2011-09-18 NOTE — ED Notes (Signed)
Pt states she was holding her grandchild and she thinks she pulled up off the couch the wrong way. Pt states she is having left arm numbness. Pt denies any chest pain or sob.

## 2011-09-18 NOTE — Discharge Instructions (Signed)
Cervical Radiculopathy Cervical radiculopathy happens when a nerve in the neck is pinched or bruised by a slipped (herniated) disk or by arthritic changes in the bones of the cervical spine. This can occur due to an injury or as part of the normal aging process. Pressure on the cervical nerves can cause pain or numbness that runs from your neck all the way down into your arm and fingers. CAUSES  There are many possible causes, including:  Injury.   Muscle tightness in the neck from overuse.   Swollen, painful joints (arthritis).   Breakdown or degeneration in the bones and joints of the spine (spondylosis) due to aging.   Bone spurs that may develop near the cervical nerves.  SYMPTOMS  Symptoms include pain, weakness, or numbness in the affected arm and hand. Pain can be severe or irritating. Symptoms may be worse when extending or turning the neck. DIAGNOSIS  Your caregiver will ask about your symptoms and do a physical exam. He or she may test your strength and reflexes. X-rays, CT scans, and MRI scans may be needed in cases of injury or if the symptoms do not go away after a period of time. Electromyography (EMG) or nerve conduction testing may be done to study how your nerves and muscles are working. TREATMENT  Your caregiver may recommend certain exercises to help relieve your symptoms. Cervical radiculopathy can, and often does, get better with time and treatment. If your problems continue, treatment options may include:  Wearing a soft collar for short periods of time.   Physical therapy to strengthen the neck muscles.   Medicines, such as nonsteroidal anti-inflammatory drugs (NSAIDs), oral corticosteroids, or spinal injections.   Surgery. Different types of surgery may be done depending on the cause of your problems.  HOME CARE INSTRUCTIONS   Put ice on the affected area.   Put ice in a plastic bag.   Place a towel between your skin and the bag.   Leave the ice on for 15  to 20 minutes, 3 to 4 times a day or as directed by your caregiver.   Use a flat pillow when you sleep.   Only take over-the-counter or prescription medicines for pain, discomfort, or fever as directed by your caregiver.   If physical therapy was prescribed, follow your caregiver's directions.   If a soft collar was prescribed, use it as directed.  SEEK IMMEDIATE MEDICAL CARE IF:   Your pain gets much worse and cannot be controlled with medicines.   You have weakness or numbness in your hand, arm, face, or leg.   You have a high fever or a stiff, rigid neck.   You lose bowel or bladder control (incontinence).   You have trouble with walking, balance, or speaking.  MAKE SURE YOU:   Understand these instructions.   Will watch your condition.   Will get help right away if you are not doing well or get worse.  Document Released: 12/19/2000 Document Revised: 03/15/2011 Document Reviewed: 11/07/2010 Exit[employer] Patient Information 2012 [employer]. 

## 2011-09-19 ENCOUNTER — Telehealth (HOSPITAL_COMMUNITY): Payer: Self-pay | Admitting: *Deleted

## 2011-09-19 NOTE — ED Notes (Signed)
Bailey Hooper from [place] called on VM 6/11 @1001 .  She called back today at 1245 and said pt. has the orange card and can't afford the up front payments for the referral to Dr. Suszanne Conners and Dr. Richardean Chimera. Pt. had called [facility] for assistance with her referrals.  She asked if we could do the referrals to [facility] ENT clinic and Neuro clinic. I told her I would try.  She had faxed me their phone numbers.  I called ENT clinic and Dois Davenport said pt. will be sent to financial counseling and if she does not qualify for Medicaid or charity then she would be billed. I called pt. back and she said she thinks she will qualify because she has the orange card. She said to go ahead with the referrals.  I called back and scheduled pt. with Dr. Kellie Shropshire ENT on 7/9 @ 1245. Chart faxed to [phone] and confirmation received. Pt. notified of appointment and that they will send her a new pt. packet to fill out. [name] 09/19/2011

## 2011-09-19 NOTE — ED Notes (Signed)
Appt. scheduled with [facility] Neuro clinic for 11/16/11 @ 1245.  Chart faxed to [phone] and confirmation received. Pt. notified of appointment and told they would send her a new patient packet with instructions.

## 2011-10-11 NOTE — ED Provider Notes (Signed)
Medical screening examination/treatment/procedure(s) were performed by non-physician practitioner and as supervising physician I was immediately available for consultation/collaboration.   Lyanne Co, MD 10/11/11 [phone]

## 2011-12-14 ENCOUNTER — Encounter: Payer: Self-pay | Admitting: Family Medicine

## 2011-12-14 ENCOUNTER — Ambulatory Visit (INDEPENDENT_AMBULATORY_CARE_PROVIDER_SITE_OTHER): Payer: Self-pay | Admitting: Family Medicine

## 2011-12-14 VITALS — BP 145/95 | HR 85 | Temp 98.3°F | Ht 66.0 in | Wt 197.0 lb

## 2011-12-14 DIAGNOSIS — Z720 Tobacco use: Secondary | ICD-10-CM

## 2011-12-14 DIAGNOSIS — L0293 Carbuncle, unspecified: Secondary | ICD-10-CM

## 2011-12-14 DIAGNOSIS — F17200 Nicotine dependence, unspecified, uncomplicated: Secondary | ICD-10-CM

## 2011-12-14 DIAGNOSIS — F329 Major depressive disorder, single episode, unspecified: Secondary | ICD-10-CM

## 2011-12-14 DIAGNOSIS — A6000 Herpesviral infection of urogenital system, unspecified: Secondary | ICD-10-CM

## 2011-12-14 DIAGNOSIS — L0292 Furuncle, unspecified: Secondary | ICD-10-CM

## 2011-12-14 DIAGNOSIS — G43909 Migraine, unspecified, not intractable, without status migrainosus: Secondary | ICD-10-CM

## 2011-12-14 DIAGNOSIS — F411 Generalized anxiety disorder: Secondary | ICD-10-CM

## 2011-12-14 DIAGNOSIS — N951 Menopausal and female climacteric states: Secondary | ICD-10-CM

## 2011-12-14 DIAGNOSIS — I10 Essential (primary) hypertension: Secondary | ICD-10-CM

## 2011-12-14 DIAGNOSIS — K589 Irritable bowel syndrome without diarrhea: Secondary | ICD-10-CM

## 2011-12-14 DIAGNOSIS — R011 Cardiac murmur, unspecified: Secondary | ICD-10-CM

## 2011-12-14 DIAGNOSIS — Z716 Tobacco abuse counseling: Secondary | ICD-10-CM | POA: Insufficient documentation

## 2011-12-14 DIAGNOSIS — N6009 Solitary cyst of unspecified breast: Secondary | ICD-10-CM

## 2011-12-14 LAB — LIPID PANEL
HDL: 32 mg/dL — ABNORMAL LOW (ref >39)
LDL Cholesterol: 104 mg/dL — ABNORMAL HIGH (ref 0–99)

## 2011-12-14 LAB — COMPREHENSIVE METABOLIC PANEL
AST: 13 U/L (ref 0–37)
Albumin: 4.2 g/dL (ref 3.5–5.2)
Alkaline Phosphatase: 76 U/L (ref 39–117)
BUN: 10 mg/dL (ref 6–23)
Calcium: 9.1 mg/dL (ref 8.4–10.5)
Chloride: 105 mEq/L (ref 96–112)
Glucose, Bld: 96 mg/dL (ref 70–99)
Potassium: 4.2 mEq/L (ref 3.5–5.3)
Sodium: 139 mEq/L (ref 135–145)
Total Protein: 7 g/dL (ref 6.0–8.3)

## 2011-12-14 NOTE — Assessment & Plan Note (Signed)
She will follow-up to discuss this further.

## 2011-12-14 NOTE — Progress Notes (Signed)
  Subjective:    Patient ID: Bailey Hooper, female    DOB: 08-05-1965, 46 y.o.   MRN: [id number]  HPI New patient visit  # Hypertension She did not take her medication this morning because she is fasting She reports compliance usually ROS: denies chest pain, lightheadedness  # Genital herpes No flare past 2-3 years  # Anxiety, depression She feels it is better controlled on anti-depressant but anxiety especially is still an issue  # Migraine Controlled on sumatriptan  # IBS Controlled on Amitiza  # Menopausal symptoms (hot flashes, vaginal dryness, decreased libido) She has been told she is too young to be going through menopause and has not been tested She is on Nexplanon so she does not have periods  # Tobacco She is cutting back on her own  Review of Systems Per HPI  Allergies, medication, past medical history, family history reviewed.     Objective:   Physical Exam GEN: NAD  PSYCH: mildly anxious-appearing; not depressed-appearing CV: RRR, no m/r/g PULM: NI WOB ABD: NABS, soft, NT, ND EXT: no edema    Assessment & Plan:

## 2011-12-14 NOTE — Assessment & Plan Note (Signed)
She will follow-up to discuss this further.  

## 2011-12-14 NOTE — Assessment & Plan Note (Signed)
Smoked at most 1/2 ppd. Now down to 2-5 cigarettes and continuing to cut back.

## 2011-12-14 NOTE — Assessment & Plan Note (Signed)
Infrequent flares 

## 2011-12-14 NOTE — Assessment & Plan Note (Signed)
Stable on Amitiza. 

## 2011-12-14 NOTE — Patient Instructions (Signed)
Follow-up at your earliest convenience to discuss your health

## 2011-12-14 NOTE — Assessment & Plan Note (Signed)
She has Nexplanon and does not get periods but she is complaining of hot flashes and vaginal dryness We will check [facility] today  She will follow-up to discuss this further

## 2011-12-14 NOTE — Assessment & Plan Note (Signed)
Controlled with prn Imitrex.  

## 2011-12-14 NOTE — Assessment & Plan Note (Signed)
She did not take her medication this morning. She will take before seeing me for follow-up in the near future.

## 2011-12-15 LAB — FOLLICLE STIMULATING HORMONE: FSH: 4.2 m[IU]/mL

## 2011-12-17 ENCOUNTER — Telehealth: Payer: Self-pay | Admitting: Family Medicine

## 2011-12-17 NOTE — Telephone Encounter (Signed)
Called and discussed lab results.  FSH WNL. Not premature menopause. Normal electrolytes and glucose.  LDL 104. Encouraged healthy diet, routine exercise.

## 2011-12-18 ENCOUNTER — Telehealth: Payer: Self-pay | Admitting: Family Medicine

## 2011-12-18 NOTE — Telephone Encounter (Signed)
Patient is calling to let her MD know that she has been having continued Sinus problems and she wants to know how Dr. Madolyn Frieze wants to treat.  The patient would like for the nurse or md to call her back.

## 2011-12-19 NOTE — Telephone Encounter (Signed)
She has a history of past rhinosinusitis. She is wondering if antibiotics are appropriate. I advised she make an appointment and be evaluated.

## 2011-12-20 ENCOUNTER — Encounter: Payer: Self-pay | Admitting: Family Medicine

## 2011-12-20 ENCOUNTER — Ambulatory Visit (INDEPENDENT_AMBULATORY_CARE_PROVIDER_SITE_OTHER): Payer: Self-pay | Admitting: Family Medicine

## 2011-12-20 VITALS — BP 126/87 | HR 109 | Temp 98.4°F | Ht 66.0 in | Wt 193.0 lb

## 2011-12-20 DIAGNOSIS — R112 Nausea with vomiting, unspecified: Secondary | ICD-10-CM | POA: Insufficient documentation

## 2011-12-20 DIAGNOSIS — J069 Acute upper respiratory infection, unspecified: Secondary | ICD-10-CM

## 2011-12-20 MED ORDER — PROMETHAZINE HCL 12.5 MG PO TABS: 12.5000 mg | ORAL_TABLET | Freq: Three times a day (TID) | ORAL | Status: DC | PRN

## 2011-12-20 MED ORDER — PROMETHAZINE HCL 25 MG/ML IJ SOLN
25.0000 mg | Freq: Once | INTRAMUSCULAR | Status: AC
  Administered 2011-12-20: 25 mg via INTRAMUSCULAR

## 2011-12-20 MED ORDER — MINOCYCLINE HCL 100 MG PO CAPS: 100.0000 mg | ORAL_CAPSULE | Freq: Two times a day (BID) | ORAL | Status: AC

## 2011-12-20 NOTE — Patient Instructions (Addendum)
I have sent in the antibiotic and the the Phenergan. Start taking these today. Make sure that you stay plenty hydrated. Come back if you can't hold down fluids, or if you start having belly pains, fevers and chills, more vomiting, or other concerns.

## 2011-12-20 NOTE — Assessment & Plan Note (Addendum)
Treat due to severity of symptoms and fact it's been about a week since start of symptoms. Treat with Minocycline, allergic to PCN and Azithromycin causes upset stomach.   FU early next week to assess for improvement in URI as well as nausea.

## 2011-12-20 NOTE — Progress Notes (Signed)
Patient ID: Bailey Hooper, female   DOB: Oct 07, 1965, 46 y.o.   MRN: [id number] Bailey Hooper is a 46 y.o. female who presents to [facility] today for concerns for URI as well as nausea:  1.  "Feeling sick:"  Started last Thursday.  Has trouble in Spring and Fall with allergic rhinitis.  Feels this is worse than usual.  Has been taking Loratadine and nasal corticosteroid without improvement.  Feels some fevers and chills subjectively.  Sinus pressure and drainage, both nasally and post-nasal drip.  Starting having some nausea over the weekend.  Eating and drinking well until yesterday.  Had 3 episodes of vomiting through night last night into early this AM.  Still some nausea.  Denies any actual abdominal pain.    The following portions of the patient's history were reviewed and updated as appropriate: allergies, current medications, past medical history, family and social history, and problem list.  Patient is a nonsmoker.  Past Medical History  Diagnosis Date  . Hypertension   . GERD (gastroesophageal reflux disease)   . Anxiety   . Depression   . Migraines   . Sleep apnea     ROS as above otherwise neg. No Chest pain, palpitations, SOB.  Medications reviewed. Current Outpatient Prescriptions  Medication Sig Dispense Refill  . albuterol (PROVENTIL HFA;VENTOLIN HFA) 108 (90 BASE) MCG/ACT inhaler Inhale 2 puffs into the lungs every 6 (six) hours as needed.      . ALPRAZolam (XANAX) 0.25 MG tablet Take 0.25 mg by mouth daily as needed.      . cloNIDine (CATAPRES) 0.1 MG tablet Take 0.1 mg by mouth daily.       Marland Kitchen etonogestrel (IMPLANON) 68 MG IMPL implant Inject 1 each into the skin once.      . hydrochlorothiazide (HYDRODIURIL) 25 MG tablet Take 25 mg by mouth daily.      Marland Kitchen lubiprostone (AMITIZA) 8 MCG capsule Take 8 mcg by mouth 2 (two) times daily with a meal.      . pantoprazole (PROTONIX) 40 MG tablet Take 40 mg by mouth daily.      . SUMAtriptan (IMITREX) 50 MG tablet Take 50 mg by mouth every 2  (two) hours as needed.      . venlafaxine (EFFEXOR) 37.5 MG tablet Take 37.5 mg by mouth daily.        Exam:  BP 126/87  Pulse 109  Temp 98.4 F (36.9 C) (Oral)  Ht 5\' 6"  (1.676 m)  Wt 193 lb (87.544 kg)  BMI 31.15 kg/m2 Gen:  Alert, cooperative patient who appears stated age in no acute distress.  Vital signs reviewed. Head:  [place]/AT Eyes:  EOMI, PERRL.  Sclera and conjunctiva non-erythematous and non-icteric. Nose:  Nares patent. Nasal turbinates grossly enlarged and erythematous BL.   Ears:  Canals clear BL.  TM's pearly gray BL without effusion or retraction Mouth:  MMM.  Tonsils +2 BL without erythema or edema noted Neck:  No lymphadenopathy noted Pulm:  Clear to auscultation bilaterally with good air movement.  No wheezes or rales noted.   Cardiac:  Regular rate and rhythm without murmur auscultated.  Good S1/S2. Abd:  Soft/nondistended/nontender.  Good bowel sounds throughout all four quadrants.  No masses noted. No guarding or rebound.  Does note some increase in nausea with abdominal palpation.  Some burping in clinic.    No results found for this or any previous visit (from the past 72 hour(s)).

## 2011-12-21 NOTE — Assessment & Plan Note (Signed)
No red flags on exam.   Provided dose of Phenergan in clinic as well as home dose.  She states this is usual par for the course for her -- has sinus infection and extensive drainage that leads to nausea and occasional vomiting. I provided the patient with explicit warnings and red flags that would prompt return to clinic or the ED.

## 2012-02-07 ENCOUNTER — Ambulatory Visit (INDEPENDENT_AMBULATORY_CARE_PROVIDER_SITE_OTHER): Payer: Self-pay | Admitting: Family Medicine

## 2012-02-07 ENCOUNTER — Encounter: Payer: Self-pay | Admitting: Family Medicine

## 2012-02-07 VITALS — BP 140/90 | HR 76 | Temp 98.3°F | Ht 66.0 in | Wt 193.0 lb

## 2012-02-07 DIAGNOSIS — J309 Allergic rhinitis, unspecified: Secondary | ICD-10-CM

## 2012-02-07 MED ORDER — LORATADINE 10 MG PO TABS: 10.0000 mg | ORAL_TABLET | Freq: Every day | ORAL | Status: DC

## 2012-02-07 NOTE — Patient Instructions (Addendum)
For the sinus congestion, take afrin spray over the counter 3-4 times a day for 4 days. No more than 4 days.  Also use nasal spray as many times as you want throughout the day. I'm sending loratadine for allergies.   If this doesn't get better by the beginning of next week, call the clinic and I'll send an antibiotic.

## 2012-02-09 NOTE — Progress Notes (Signed)
Patient ID: Bailey Hooper    DOB: 1965-10-17, 46 y.o.   MRN: [id number] --- Subjective:  Bailey Hooper is a 45 y.o.female who presents with nasal and head congestion x2 days. She was seen in clinic a month ago and treated for sinusitis. She felt better a week later. In the last 2 days, she has had worsening nasal congestion, frontal and maxillary sinus pressure. Reports rhinorrhea, itchy eyes and sneezing. Denies fever or chills. Denies any significant shortness of breath other than associated with nasal congestion.  She has been taking dimetapp which has helped. Also taking asterpro.   ROS: see HPI Past Medical History: reviewed and updated medications and allergies Social History: Tobacco: denies  Objective: Filed Vitals:   02/07/12 1537  BP: 140/90  Pulse: 76  Temp: 98.3 F (36.8 C)    Physical Examination:   General appearance - alert, well appearing, and in no distress Ears - bilateral TM's and external ear canals normal Nose - severely erythematous and edematous nasal turbinates, no purulent drainage, bilaterall tenderness along maxillary sinuses.  Mouth - mucous membranes moist,no tonsillar exudates, erythematous oropharynx Neck - supple, no significant adenopathy Chest - clear to auscultation, no wheezes, rales or rhonchi, symmetric air entry Heart - normal rate, regular rhythm, normal S1, S2, no murmurs, rubs, clicks or gallops

## 2012-02-09 NOTE — Assessment & Plan Note (Signed)
Likely flare of allergic rhinitis although second sickening after antibiotic use could suggest sinusitis. Will treat conservatively with local decongestant Afrin, nasal irrigation and starting an antihistamine which patient had not been taking. If not resolved by the beginning of next week, will send in antibiotic likely amoxicillin or keflex. (patient states that despite her penicillin allergy she has taken amoxicillin and tolerated it)

## 2012-03-11 ENCOUNTER — Encounter: Payer: Self-pay | Admitting: Home Health Services

## 2012-05-22 ENCOUNTER — Encounter: Payer: Self-pay | Admitting: Family Medicine

## 2012-05-26 ENCOUNTER — Other Ambulatory Visit: Payer: Self-pay | Admitting: Family Medicine

## 2012-05-26 DIAGNOSIS — Z1231 Encounter for screening mammogram for malignant neoplasm of breast: Secondary | ICD-10-CM

## 2012-06-01 ENCOUNTER — Telehealth: Payer: Self-pay | Admitting: Family Medicine

## 2012-06-01 NOTE — Telephone Encounter (Signed)
Family Medicine Emergency Line call: Was paged by operator to numbers: [phone] and [phone]. Line was discontinued for first number. I tried second number that was answered by someone else. Was told by operator that Threasa Beards had tried calling. I tried calling the listed mobile number which was disconnected.  Will be available for any further call.  Marena Chancy, PGY-2 Family Medicine Resident

## 2012-06-09 ENCOUNTER — Ambulatory Visit (INDEPENDENT_AMBULATORY_CARE_PROVIDER_SITE_OTHER): Payer: No Typology Code available for payment source | Admitting: Family Medicine

## 2012-06-09 VITALS — BP 158/98 | HR 79 | Temp 98.7°F | Ht 66.0 in | Wt 183.0 lb

## 2012-06-09 DIAGNOSIS — F411 Generalized anxiety disorder: Secondary | ICD-10-CM

## 2012-06-09 DIAGNOSIS — K0889 Other specified disorders of teeth and supporting structures: Secondary | ICD-10-CM

## 2012-06-09 DIAGNOSIS — K089 Disorder of teeth and supporting structures, unspecified: Secondary | ICD-10-CM

## 2012-06-09 DIAGNOSIS — F329 Major depressive disorder, single episode, unspecified: Secondary | ICD-10-CM

## 2012-06-09 MED ORDER — HYDROCHLOROTHIAZIDE 25 MG PO TABS: 25.0000 mg | ORAL_TABLET | Freq: Every day | ORAL | Status: DC

## 2012-06-09 MED ORDER — PANTOPRAZOLE SODIUM 40 MG PO TBEC: 40.0000 mg | DELAYED_RELEASE_TABLET | Freq: Every day | ORAL | Status: DC

## 2012-06-09 MED ORDER — VENLAFAXINE HCL 75 MG PO TABS: 75.0000 mg | ORAL_TABLET | Freq: Two times a day (BID) | ORAL | Status: DC

## 2012-06-09 MED ORDER — ALPRAZOLAM 0.25 MG PO TABS: 0.2500 mg | ORAL_TABLET | Freq: Every day | ORAL | Status: DC | PRN

## 2012-06-09 MED ORDER — CLONIDINE HCL 0.1 MG PO TABS: 0.1000 mg | ORAL_TABLET | Freq: Every day | ORAL | Status: DC

## 2012-06-09 NOTE — Progress Notes (Signed)
  Subjective:    Patient ID: Bailey Hooper, female    DOB: 1965-09-14, 47 y.o.   MRN: [id number]  HPI # Depression, anxiety Her symptoms have worsened recently.    Anxiety   Palpitations   Decreased appetite   Restlessness at night--she takes Trazodone which helps her fall asleep but then she wakes up and has a difficult time falling back asleep   Crying spells   Suicidal thoughts without intent   Decreased motivation   Irritability  She has difficulty concentrating at work. She works for the [employer].   She takes Xanax 0.5 to 2 tablets a day, 1-2 times a week.   More stressors recently: 63 year old daughter just had baby and patient feels like she is having to pick up new responsibilities; she is concerned about her finances   # Occasional dental pain, none currently. She would like to be referred to a dentist. She has not been seen for a while.  ROS: denies fevers/chills, tooth drainage.   Review of Systems Per HPI Manic episodes? No but sometimes she get angry quickly and then regrets her behavior  Allergies, medication, past medical history reviewed.  Smoking status noted.  Family history:  Mother: depression No bipolar disorder     Objective:   Physical Exam GEN: NAD; overweight MOUTH: caries and a few crowns; no gum erythema; no obvious tooth tenderness PSYCH: mildly anxious/depressed appearing; appropriate to questions; alert and oriented CV: RRR   PHQ-9: 17/27     Assessment & Plan:

## 2012-06-09 NOTE — Assessment & Plan Note (Signed)
See depression

## 2012-06-09 NOTE — Assessment & Plan Note (Signed)
Generalized anxiety with depression with anxiety symptoms more prominent.  Effexor had worked well for her when she started about a year ago but recently with new stressors she feels like her symptoms have worsened.  -Increase from 75 daily to bid. I do not think her mood irritability is indicative of submanic symptoms, however, we discussed monitoring for these symptoms, and she will call if any concerns.  -She takes Xanax infrequently. She may continue this as needed. We discussed how anxiety may worsen next week or so but should improve as her body adjusts to new dose.  -If Effexor does not help, consider Lexapro, which she tolerated well in the past.  -Follow-up in 1 week.

## 2012-06-09 NOTE — Patient Instructions (Addendum)
Take Effexor 75 mg twice daily  Xanax as needed  Dental referral  Follow-up in 1 week

## 2012-06-12 ENCOUNTER — Ambulatory Visit (HOSPITAL_COMMUNITY)
Admission: RE | Admit: 2012-06-12 | Discharge: 2012-06-12 | Disposition: A | Discharge status: Home / Self Care | Payer: No Typology Code available for payment source | Source: Ambulatory Visit | Attending: Family Medicine | Admitting: Family Medicine

## 2012-06-12 DIAGNOSIS — Z1231 Encounter for screening mammogram for malignant neoplasm of breast: Secondary | ICD-10-CM

## 2012-06-23 ENCOUNTER — Telehealth: Payer: Self-pay | Admitting: *Deleted

## 2012-06-23 NOTE — Telephone Encounter (Signed)
F/u on pt's smoking cessation. Pt reports smoking 2 cigarettes/day. Declined offer to meet with pharmacist or health coach at this time.

## 2012-06-30 ENCOUNTER — Emergency Department (HOSPITAL_COMMUNITY): Payer: No Typology Code available for payment source

## 2012-06-30 ENCOUNTER — Encounter (HOSPITAL_COMMUNITY): Payer: Self-pay | Admitting: *Deleted

## 2012-06-30 ENCOUNTER — Emergency Department (HOSPITAL_COMMUNITY)
Admission: EM | Admit: 2012-06-30 | Discharge: 2012-07-01 | Disposition: A | Discharge status: Home / Self Care | Payer: No Typology Code available for payment source | Attending: Emergency Medicine | Admitting: Emergency Medicine

## 2012-06-30 DIAGNOSIS — K219 Gastro-esophageal reflux disease without esophagitis: Secondary | ICD-10-CM | POA: Insufficient documentation

## 2012-06-30 DIAGNOSIS — F17200 Nicotine dependence, unspecified, uncomplicated: Secondary | ICD-10-CM | POA: Insufficient documentation

## 2012-06-30 DIAGNOSIS — F3289 Other specified depressive episodes: Secondary | ICD-10-CM | POA: Insufficient documentation

## 2012-06-30 DIAGNOSIS — F329 Major depressive disorder, single episode, unspecified: Secondary | ICD-10-CM | POA: Insufficient documentation

## 2012-06-30 DIAGNOSIS — R51 Headache: Secondary | ICD-10-CM

## 2012-06-30 DIAGNOSIS — Z79899 Other long term (current) drug therapy: Secondary | ICD-10-CM | POA: Insufficient documentation

## 2012-06-30 DIAGNOSIS — I10 Essential (primary) hypertension: Secondary | ICD-10-CM

## 2012-06-30 DIAGNOSIS — F411 Generalized anxiety disorder: Secondary | ICD-10-CM | POA: Insufficient documentation

## 2012-06-30 DIAGNOSIS — G4730 Sleep apnea, unspecified: Secondary | ICD-10-CM | POA: Insufficient documentation

## 2012-06-30 DIAGNOSIS — G43909 Migraine, unspecified, not intractable, without status migrainosus: Secondary | ICD-10-CM | POA: Insufficient documentation

## 2012-06-30 LAB — BASIC METABOLIC PANEL
BUN: 9 mg/dL (ref 6–23)
CO2: 27 mEq/L (ref 19–32)
Calcium: 9.3 mg/dL (ref 8.4–10.5)
Creatinine, Ser: 0.6 mg/dL (ref 0.50–1.10)
Glucose, Bld: 97 mg/dL (ref 70–99)

## 2012-06-30 LAB — CBC WITH DIFFERENTIAL/PLATELET
Basophils Absolute: 0.1 10*3/uL (ref 0.0–0.1)
Eosinophils Absolute: 0.3 10*3/uL (ref 0.0–0.7)
Eosinophils Relative: 3 % (ref 0–5)
HCT: 41.7 % (ref 36.0–46.0)
MCH: 26.9 pg (ref 26.0–34.0)
MCHC: 33.3 g/dL (ref 30.0–36.0)
MCV: 80.8 fL (ref 78.0–100.0)
Monocytes Absolute: 0.7 10*3/uL (ref 0.1–1.0)
Platelets: 344 10*3/uL (ref 150–400)
RDW: 14 % (ref 11.5–15.5)
WBC: 9.6 10*3/uL (ref 4.0–10.5)

## 2012-06-30 MED ORDER — HYDROMORPHONE HCL PF 1 MG/ML IJ SOLN
1.0000 mg | Freq: Once | INTRAMUSCULAR | Status: AC
  Administered 2012-06-30: 1 mg via INTRAVENOUS
  Filled 2012-06-30: qty 1

## 2012-06-30 MED ORDER — METOCLOPRAMIDE HCL 5 MG/ML IJ SOLN
10.0000 mg | Freq: Once | INTRAMUSCULAR | Status: AC
  Administered 2012-06-30: 10 mg via INTRAVENOUS
  Filled 2012-06-30: qty 2

## 2012-06-30 MED ORDER — CLONIDINE HCL 0.1 MG PO TABS
0.1000 mg | ORAL_TABLET | Freq: Once | ORAL | Status: AC
  Administered 2012-07-01: 0.1 mg via ORAL
  Filled 2012-06-30: qty 1

## 2012-06-30 MED ORDER — DIPHENHYDRAMINE HCL 50 MG/ML IJ SOLN
12.5000 mg | Freq: Once | INTRAMUSCULAR | Status: AC
  Administered 2012-06-30: 12.5 mg via INTRAVENOUS
  Filled 2012-06-30: qty 1

## 2012-06-30 MED ORDER — TRAMADOL HCL 50 MG PO TABS: 50.0000 mg | ORAL_TABLET | Freq: Four times a day (QID) | ORAL | Status: DC | PRN

## 2012-06-30 NOTE — ED Notes (Signed)
Patient transported to CT 

## 2012-06-30 NOTE — ED Notes (Signed)
Headache since Friday; unrelieved by multiple otc; bp elevated in triage; unrelieved by imitrex; vomiting earlier today; facial pain similar to sinus headache; cbg at home 140's

## 2012-06-30 NOTE — ED Provider Notes (Addendum)
History    CSN: [id number] Arrival date & time 06/30/12  2039 First MD Initiated Contact with Patient 06/30/12 2202      Chief Complaint  Patient presents with  . Headache    HPI Comments: Pt started having headache months ago. .  She had been having trouble with vertigo and headaches since last year.   She was told that everything was OK from an ENT perspective after seeing an otolaryngologist at [facility] she felt like she was having a sinus headache.  She then tried her migraine medication and that did not help. Yesterday she vomited.  Her symptoms have persisted.     Pt is concerned because she has not seen a neurologist.  She has not been able to have any brain imaging as well.  Previously she has had trouble with visual disturbances and balance problems although she is not specifically having trouble tonight.  Patient is a 47 y.o. female presenting with headaches. The history is provided by the patient.  Headache Pain location:  R temporal and R parietal Quality:  Dull Severity at highest:  10/10   Past Medical History  Diagnosis Date  . Hypertension   . GERD (gastroesophageal reflux disease)   . Anxiety   . Depression   . Migraines   . Sleep apnea     Past Surgical History  Procedure Laterality Date  . Foot surgery      Left foot  . Cesarean section      Family History  Problem Relation Age of Onset  . Colon cancer      Father  . Cancer      Oral, uncle  . Alcohol abuse      Father  . Drug abuse      Father  . Osteoporosis    . Depression    . Hypertension    . Diabetes    . Heart disease      Grandmother    History  Substance Use Topics  . Smoking status: Current Every Day Smoker    Types: Cigarettes  . Smokeless tobacco: Not on file     Comment: trying to quitt. does not smoke, when sick  . Alcohol Use: No    OB History   Grav Para Term Preterm Abortions TAB SAB Ect Mult Living                  Review of Systems   Neurological: Positive for headaches.  All other systems reviewed and are negative.    Allergies  Penicillins  Home Medications   Current Outpatient Rx  Name  Route  Sig  Dispense  Refill  . albuterol (PROVENTIL HFA;VENTOLIN HFA) 108 (90 BASE) MCG/ACT inhaler   Inhalation   Inhale 2 puffs into the lungs every 6 (six) hours as needed for wheezing.          Marland Kitchen ALPRAZolam (XANAX) 0.25 MG tablet   Oral   Take 0.25 mg by mouth daily as needed for anxiety.         . cephALEXin (KEFLEX) 500 MG capsule   Oral   Take 500 mg by mouth 2 (two) times daily.         . cloNIDine (CATAPRES) 0.1 MG tablet   Oral   Take 1 tablet (0.1 mg total) by mouth daily.   60 tablet   0   . etonogestrel (IMPLANON) 68 MG IMPL implant   Subcutaneous   Inject 1 each into the skin  once.         . hydrochlorothiazide (HYDRODIURIL) 25 MG tablet   Oral   Take 1 tablet (25 mg total) by mouth daily.   30 tablet   6   . loratadine (CLARITIN) 10 MG tablet   Oral   Take 1 tablet (10 mg total) by mouth daily.   30 tablet   11   . lubiprostone (AMITIZA) 8 MCG capsule   Oral   Take 8 mcg by mouth 2 (two) times daily with a meal.         . pantoprazole (PROTONIX) 40 MG tablet   Oral   Take 1 tablet (40 mg total) by mouth daily.   30 tablet   6   . SUMAtriptan (IMITREX) 50 MG tablet   Oral   Take 50 mg by mouth every 2 (two) hours as needed for migraine.          . venlafaxine (EFFEXOR) 75 MG tablet   Oral   Take 1 tablet (75 mg total) by mouth 2 (two) times daily.   60 tablet   0   . EXPIRED: promethazine (PHENERGAN) 12.5 MG tablet   Oral   Take 1 tablet (12.5 mg total) by mouth every 8 (eight) hours as needed for nausea.   20 tablet   0   . traMADol (ULTRAM) 50 MG tablet   Oral   Take 1 tablet (50 mg total) by mouth every 6 (six) hours as needed for pain.   15 tablet   0     BP 164/99  Pulse 72  Temp(Src) 98.5 F (36.9 C) (Oral)  Resp 18  SpO2 99%  Physical Exam   Nursing note and vitals reviewed. Constitutional: She is oriented to person, place, and time. She appears well-developed and well-nourished. No distress.  HENT:  Head: Normocephalic and atraumatic.  Right Ear: External ear normal.  Left Ear: External ear normal.  Mouth/Throat: Oropharynx is clear and moist.  Eyes: Conjunctivae are normal. Right eye exhibits no discharge. Left eye exhibits no discharge. No scleral icterus.  Neck: Neck supple. No tracheal deviation present.  Cardiovascular: Normal rate, regular rhythm and intact distal pulses.   Pulmonary/Chest: Effort normal and breath sounds normal. No stridor. No respiratory distress. She has no wheezes. She has no rales.  Abdominal: Soft. Bowel sounds are normal. She exhibits no distension. There is no tenderness. There is no rebound and no guarding.  Musculoskeletal: She exhibits no edema and no tenderness.  Neurological: She is alert and oriented to person, place, and time. She has normal strength. No cranial nerve deficit ( no gross defecits noted) or sensory deficit. She exhibits normal muscle tone. She displays no seizure activity. Coordination normal.  No pronator drift bilateral upper extrem, able to hold both legs off bed for 5 seconds, sensation intact in all extremities, no visual field cuts, no left or right sided neglect  Skin: Skin is warm and dry. No rash noted.  Psychiatric: She has a normal mood and affect.    ED Course  Procedures (including critical care time) Medications  cloNIDine (CATAPRES) tablet 0.1 mg (not administered)  metoCLOPramide (REGLAN) injection 10 mg (10 mg Intravenous Given 06/30/12 2245)  diphenhydrAMINE (BENADRYL) injection 12.5 mg (12.5 mg Intravenous Given 06/30/12 2245)  HYDROmorphone (DILAUDID) injection 1 mg (1 mg Intravenous Given 06/30/12 2245)   patient takes clonidine for blood pressure. She was given an additional dose in the emergency department  Labs Reviewed  BASIC METABOLIC PANEL -  Abnormal; Notable for the following:    Potassium 3.4 (*)    All other components within normal limits  CBC WITH DIFFERENTIAL - Abnormal; Notable for the following:    RBC 5.16 (*)    All other components within normal limits   Ct Head Wo Contrast  06/30/2012  *RADIOLOGY REPORT*  Clinical Data: Headache.  CT HEAD WITHOUT CONTRAST  Technique:  Contiguous axial images were obtained from the base of the skull through the vertex without contrast.  Comparison: None.  Findings: The ventricles are normal.  No extra-axial fluid collections are seen.  The brainstem and cerebellum are unremarkable.  No acute intracranial findings such as infarction or hemorrhage.  No mass lesions.  The bony calvarium is intact.  The visualized paranasal sinuses and mastoid air cells are clear.  IMPRESSION: No acute intracranial findings or mass lesions.   Original Report Authenticated By: Rudie Meyer, M.D.      1. Headache   2. Hypertension       MDM  Patient's evaluation emergency room is unremarkable. CT scan does not show any signs of mass lesion to account for her chronic headaches. The patient does not have any signs to suggest meningitis. History would not be consistent with a subarachnoid hemorrhage.  Regarding her hypertension, she does continue to remain slightly elevated. I will encourage her to follow up with her primary care Dr. when asked the patient to little bit more about her blood pressure, she mentioned she does not always take her clonidine twice a day. She will often skip a dose if her blood pressure is normal. I explained to her that is very important to take her medications as directed. I explained to her that the medication will wear off and even though the blood pressure is normal the medication needs to be taken twice daily  Time there doesn't appear to be evidence of an acute emergency medical condition. I think the patient would benefit from neurology consultation. I will for the patient as  an outpatient      Celene Kras, MD 06/30/12 2350

## 2012-07-04 ENCOUNTER — Encounter: Payer: Self-pay | Admitting: Family Medicine

## 2012-07-04 ENCOUNTER — Ambulatory Visit (INDEPENDENT_AMBULATORY_CARE_PROVIDER_SITE_OTHER): Payer: No Typology Code available for payment source | Admitting: Family Medicine

## 2012-07-04 VITALS — BP 160/93 | HR 76 | Temp 98.7°F | Ht 66.0 in | Wt 180.4 lb

## 2012-07-04 DIAGNOSIS — G43909 Migraine, unspecified, not intractable, without status migrainosus: Secondary | ICD-10-CM

## 2012-07-04 DIAGNOSIS — B9689 Other specified bacterial agents as the cause of diseases classified elsewhere: Secondary | ICD-10-CM

## 2012-07-04 DIAGNOSIS — R350 Frequency of micturition: Secondary | ICD-10-CM

## 2012-07-04 DIAGNOSIS — I10 Essential (primary) hypertension: Secondary | ICD-10-CM

## 2012-07-04 DIAGNOSIS — N760 Acute vaginitis: Secondary | ICD-10-CM

## 2012-07-04 DIAGNOSIS — A499 Bacterial infection, unspecified: Secondary | ICD-10-CM

## 2012-07-04 LAB — POCT WET PREP (WET MOUNT)

## 2012-07-04 LAB — GLUCOSE, CAPILLARY: Glucose-Capillary: 95 mg/dL (ref 70–99)

## 2012-07-04 MED ORDER — OLMESARTAN MEDOXOMIL 20 MG PO TABS: 20.0000 mg | ORAL_TABLET | Freq: Every day | ORAL | Status: DC

## 2012-07-04 MED ORDER — FLUTICASONE PROPIONATE 50 MCG/ACT NA SUSP: 2.0000 | Freq: Every day | NASAL | Status: DC

## 2012-07-04 MED ORDER — METRONIDAZOLE 0.75 % VA GEL: 1.0000 | Freq: Two times a day (BID) | VAGINAL | Status: DC

## 2012-07-04 MED ORDER — CLONIDINE HCL 0.1 MG PO TABS: 0.1000 mg | ORAL_TABLET | Freq: Two times a day (BID) | ORAL | Status: DC

## 2012-07-04 NOTE — Patient Instructions (Addendum)
Increase Clonidine 0.1 mg twice a day Continue HCTZ  Bring your cuff with you   Try Metrogel twice weekly for BV   Follow-up in 1 week

## 2012-07-04 NOTE — Progress Notes (Signed)
  Subjective:    Patient ID: Bailey Hooper, female    DOB: [birth year], 47 y.o.   MRN: [id number]  HPI # Headache, elevated blood pressure, concern for elevated sugar to 140s at home fasting (her mother has glucometer) Imitrex did not help her headache. She has a slight headache today.    Headache quality: usually one sided, changes sides, facial pressure, puffy face, excruciating, tight throbbing feeling     ROS: denies vision or hearing changes at that time; pressure behind right ear at that time but none right now    Other medications: Tramadol helps but she cannot take it at work because it makes her sleepy  ROS: feels she has more urinary frequency at nighttime; she takes HCTZ in AM; may be more thirsty than usual   # She is endorsing vaginal odor and discharge  Duration? Started early March Sexually active? One female partner (boyfriend); does not use condoms ROS: denies abdominal or back pain, dysuria; occasional chills, denies fevers; denies dyspareunia but she gets dry really fast   # Depression  She feels Effexor did not help   Review of Systems Per HPI  Allergies, medication, past medical history reviewed.  Smoking status noted.     Objective:   Physical Exam        Assessment & Plan:

## 2012-07-04 NOTE — Assessment & Plan Note (Signed)
Rx for Metrogel bid x 5 days

## 2012-07-04 NOTE — Progress Notes (Signed)
  Subjective:    Patient ID: Bailey Hooper, female    DOB: Dec 13, 1965, 47 y.o.   MRN: [id number]  HPI # Headache, concern about sugars She has had worsening headaches recently. She has been hypertensive and seen in the ED for this.  Imitrex does not help her symptoms. Tramadol helps but makes her sleepy and she cannot take on day she has to work.  Her headache is right or left sided, severe when it occurs, sometimes associated with nausea and photophobia. She denies hearing or vision changes. She has a mild headache at this time. Her sugar at home was 140 fasting (her mother has glucometer).  # Vaginal discharge for 3 weeks Fishy odor Sexual active with boyfriend of 3 years. They do not use protection.  ROS: denies abdominal pain, fevers, chills  Review of Systems Per HPI Endorses some urinary frequency at nighttime; denies dysuria Denies chest pain, dyspnea  Allergies, medication, past medical history reviewed.  Smoking status noted.     Objective:   Physical Exam GEN: NAD  GU: thin white vaginal discharge; no vaginal erythema/tenderness CV: RRR PULM: NI WOB ABD: soft, NT, ND  POC Glc: 95     Assessment & Plan:

## 2012-07-04 NOTE — Assessment & Plan Note (Signed)
Poorly controlled. Likely contributing to her headaches.  -Increase Clonidine from 0.1 qd to bid  -Continue HCTZ 25 -Follow-up in 1 week

## 2012-07-04 NOTE — Assessment & Plan Note (Signed)
Worsened recently. See above regarding hypertension.

## 2012-07-15 ENCOUNTER — Telehealth: Payer: Self-pay | Admitting: Family Medicine

## 2012-07-15 NOTE — Telephone Encounter (Signed)
Patient called into after hours line with headache that started today at 2 PM. B/l  Temple pain, facial pressure, pain behind R ear. Similar symptoms last week at [facility].  Associated symptoms: nausea, vomiting (once). Post nasal drip Denies weakness, numbness or tingling, fever. Tried tramadol w/o relief.   Patient has imitrex but has not tried it.  Plan: imitrex x one, may repeat in 2 hrs if no improvement. Advised ED for: intractable pain or emesis Advised patient call first thing in AM for same day visit if she dose not need to go to ED.   Patient agreed with plan and voiced understanding.

## 2012-07-22 ENCOUNTER — Encounter: Payer: Self-pay | Admitting: Family Medicine

## 2012-07-22 ENCOUNTER — Ambulatory Visit (INDEPENDENT_AMBULATORY_CARE_PROVIDER_SITE_OTHER): Payer: No Typology Code available for payment source | Admitting: Family Medicine

## 2012-07-22 VITALS — BP 136/87 | HR 76 | Wt 181.8 lb

## 2012-07-22 DIAGNOSIS — G43909 Migraine, unspecified, not intractable, without status migrainosus: Secondary | ICD-10-CM

## 2012-07-22 DIAGNOSIS — F329 Major depressive disorder, single episode, unspecified: Secondary | ICD-10-CM

## 2012-07-22 DIAGNOSIS — I10 Essential (primary) hypertension: Secondary | ICD-10-CM

## 2012-07-22 MED ORDER — NORTRIPTYLINE HCL 25 MG PO CAPS: ORAL_CAPSULE | ORAL | Status: DC

## 2012-07-22 MED ORDER — METRONIDAZOLE 500 MG PO TABS: 500.0000 mg | ORAL_TABLET | Freq: Two times a day (BID) | ORAL | Status: DC

## 2012-07-22 NOTE — Progress Notes (Signed)
  Subjective:    Patient ID: Bailey Hooper, female    DOB: 04/01/1966, 47 y.o.   MRN: [id number]  HPI # HTN follow-up She takes clonidine and HCTZ  # She has frequent headaches; about 3-4 times a week; really bad 1-2 times a week. She does not have one at this time.  Imitrex helps but takes time to kick in.  Ibuprofen and Motrin helps some.   She stopped Effexor because she did not think it was helping.   Review of Systems Denies chest pain, difficulty breathing  Allergies, medication, past medical history reviewed.  Smoking status noted.     Objective:   Physical Exam GEN: NAD  PSYCH: does not appear depressed or anxious; pleasant; alert and oriented; appropriate to question NEURO: no focal deficits; gait normal      Assessment & Plan:

## 2012-07-22 NOTE — Assessment & Plan Note (Signed)
See depression A/P. 

## 2012-07-22 NOTE — Assessment & Plan Note (Signed)
Not well controlled per patient. Her headaches might be due to psychiatric conditions (anxiety, depression). She stopped Effexor on her own.  -Try nortriptyline to help with migraine prophylaxis, insomnia, and depression -Follow-up in 2-4 weeks

## 2012-07-22 NOTE — Patient Instructions (Addendum)
Start nortriptyline for: -depression -help you sleep -prevent migraines 1. 1 tablet at bedtime (25 mg) 2. If you do okay on this dose, take 50 mg at bedtime 3. If you do okay on this dose, try 50 mg at bedtime then 25 mg in the morning  Call me in 1 week and let me know how you are doing   Continue blood pressure medications for now  Follow-up in 2 weeks

## 2012-07-22 NOTE — Assessment & Plan Note (Addendum)
Controlled. Continue Clonidine and HCTZ. Consider switching to combination medication (ARB/ACEi with HCTZ) due to her wanting to minimize number of pills. We will discuss at subsequent visit.

## 2012-08-16 ENCOUNTER — Emergency Department (HOSPITAL_COMMUNITY): Payer: No Typology Code available for payment source

## 2012-08-16 ENCOUNTER — Emergency Department (HOSPITAL_COMMUNITY)
Admission: EM | Admit: 2012-08-16 | Discharge: 2012-08-16 | Disposition: A | Discharge status: Home / Self Care | Payer: No Typology Code available for payment source | Attending: Emergency Medicine | Admitting: Emergency Medicine

## 2012-08-16 ENCOUNTER — Encounter (HOSPITAL_COMMUNITY): Payer: Self-pay | Admitting: *Deleted

## 2012-08-16 DIAGNOSIS — F329 Major depressive disorder, single episode, unspecified: Secondary | ICD-10-CM | POA: Insufficient documentation

## 2012-08-16 DIAGNOSIS — Z880 Allergy status to penicillin: Secondary | ICD-10-CM | POA: Insufficient documentation

## 2012-08-16 DIAGNOSIS — F411 Generalized anxiety disorder: Secondary | ICD-10-CM | POA: Insufficient documentation

## 2012-08-16 DIAGNOSIS — F17200 Nicotine dependence, unspecified, uncomplicated: Secondary | ICD-10-CM | POA: Insufficient documentation

## 2012-08-16 DIAGNOSIS — M2170 Unequal limb length (acquired), unspecified site: Secondary | ICD-10-CM | POA: Insufficient documentation

## 2012-08-16 DIAGNOSIS — M545 Low back pain, unspecified: Secondary | ICD-10-CM | POA: Insufficient documentation

## 2012-08-16 DIAGNOSIS — M25551 Pain in right hip: Secondary | ICD-10-CM

## 2012-08-16 DIAGNOSIS — G43909 Migraine, unspecified, not intractable, without status migrainosus: Secondary | ICD-10-CM | POA: Insufficient documentation

## 2012-08-16 DIAGNOSIS — M25559 Pain in unspecified hip: Secondary | ICD-10-CM | POA: Insufficient documentation

## 2012-08-16 DIAGNOSIS — Z8669 Personal history of other diseases of the nervous system and sense organs: Secondary | ICD-10-CM | POA: Insufficient documentation

## 2012-08-16 DIAGNOSIS — I10 Essential (primary) hypertension: Secondary | ICD-10-CM | POA: Insufficient documentation

## 2012-08-16 DIAGNOSIS — Z79899 Other long term (current) drug therapy: Secondary | ICD-10-CM | POA: Insufficient documentation

## 2012-08-16 DIAGNOSIS — IMO0002 Reserved for concepts with insufficient information to code with codable children: Secondary | ICD-10-CM | POA: Insufficient documentation

## 2012-08-16 DIAGNOSIS — F3289 Other specified depressive episodes: Secondary | ICD-10-CM | POA: Insufficient documentation

## 2012-08-16 DIAGNOSIS — Z8719 Personal history of other diseases of the digestive system: Secondary | ICD-10-CM | POA: Insufficient documentation

## 2012-08-16 HISTORY — DX: Irritable bowel syndrome, unspecified: K58.9

## 2012-08-16 NOTE — ED Notes (Signed)
Pt c/o R hip pain x 2 weeks, progressively worse. Pt c/o sharp pain radiating down back of thigh which is worse when changing positions. Pt states OTC pain meds no longer helping with her pain. Pt having difficulty sitting, lying, and standing. Pt denies overt injury. Pt is able to ambulate and states pain does not impact her ability to walk but changing positions increases her pain, markedly.

## 2012-08-16 NOTE — ED Provider Notes (Signed)
History    This chart was scribed for Bailey Hooper, a non-physician practitioner working with Loren Racer, MD by Lewanda Rife, ED Scribe. This patient was seen in room WTR6/WTR6 and the patient's care was started at 2014.     CSN: [id number]  Arrival date & time 08/16/12  1918   First MD Initiated Contact with Patient 08/16/12 1943      Chief Complaint  Patient presents with  . Hip Pain    (Consider location/radiation/quality/duration/timing/severity/associated sxs/prior treatment) HPI HPI Comments:  Bailey Hooper is a 47 y.o. female who presents to the Emergency Department complaining of constant worsening right hip pain onset 2 weeks. Reports stands most of the time at work. Denies numbness, paresthesias, and recent injury. Reports right hip pain is aggravated with all positions and alleviated by nothing. Additionally, reports mild low back pain. Reports taking OTC pain medications with no relief of symptoms. Pt additionally reports one leg is shorter than the other (unsure which one), but does not wear recommended lifts.   Past Medical History  Diagnosis Date  . Hypertension   . GERD (gastroesophageal reflux disease)   . Anxiety   . Depression   . Migraines   . Sleep apnea   . IBS (irritable bowel syndrome)     Past Surgical History  Procedure Laterality Date  . Foot surgery      Left foot  . Cesarean section      Family History  Problem Relation Age of Onset  . Colon cancer      Father  . Cancer      Oral, uncle  . Alcohol abuse      Father  . Drug abuse      Father  . Osteoporosis    . Depression    . Hypertension    . Diabetes    . Heart disease      Grandmother    History  Substance Use Topics  . Smoking status: Current Every Day Smoker    Types: Cigarettes  . Smokeless tobacco: Not on file     Comment: trying to quitt. does not smoke, when sick  . Alcohol Use: No    OB History   Grav Para Term Preterm Abortions TAB SAB Ect  Mult Living                  Review of Systems A complete 10 system review of systems was obtained and all systems are negative except as noted in the HPI and PMH.    Allergies  Erythromycin and Penicillins  Home Medications   Current Outpatient Rx  Name  Route  Sig  Dispense  Refill  . albuterol (PROVENTIL HFA;VENTOLIN HFA) 108 (90 BASE) MCG/ACT inhaler   Inhalation   Inhale 2 puffs into the lungs every 6 (six) hours as needed for wheezing.          Marland Kitchen ALPRAZolam (XANAX) 0.25 MG tablet   Oral   Take 0.25 mg by mouth daily as needed for anxiety.         . cloNIDine (CATAPRES) 0.1 MG tablet   Oral   Take 1 tablet (0.1 mg total) by mouth 2 (two) times daily.   60 tablet   0   . fluticasone (FLONASE) 50 MCG/ACT nasal spray   Nasal   Place 2 sprays into the nose daily.   16 g   6   . hydrochlorothiazide (HYDRODIURIL) 25 MG tablet   Oral   Take 1  tablet (25 mg total) by mouth daily.   30 tablet   6   . loratadine (CLARITIN) 10 MG tablet   Oral   Take 1 tablet (10 mg total) by mouth daily.   30 tablet   11   . lubiprostone (AMITIZA) 8 MCG capsule   Oral   Take 8 mcg by mouth 2 (two) times daily with a meal.         . nortriptyline (PAMELOR) 25 MG capsule      Take 1 tablet at bedtime. If you tolerate this, go up to 2 tablets at bedtime.   60 capsule   0   . pantoprazole (PROTONIX) 40 MG tablet   Oral   Take 1 tablet (40 mg total) by mouth daily.   30 tablet   6   . SUMAtriptan (IMITREX) 50 MG tablet   Oral   Take 50 mg by mouth every 2 (two) hours as needed for migraine.          Marland Kitchen etonogestrel (IMPLANON) 68 MG IMPL implant   Subcutaneous   Inject 1 each into the skin once.           BP 131/88  Pulse 81  Temp(Src) 98.6 F (37 C) (Oral)  Resp 18  SpO2 100%  Physical Exam  Nursing note and vitals reviewed. Constitutional: She is oriented to person, place, and time. She appears well-developed and well-nourished. No distress.  HENT:   Head: Normocephalic and atraumatic.  Eyes: EOM are normal.  Neck: Neck supple. No tracheal deviation present.  Cardiovascular: Normal rate.   Pulses:      Dorsalis pedis pulses are 2+ on the right side, and 2+ on the left side.  Pulmonary/Chest: Effort normal. No respiratory distress.  Musculoskeletal: Normal range of motion. She exhibits no tenderness.       Right hip: She exhibits normal range of motion, no tenderness, no bony tenderness, no crepitus and no laceration.       Cervical back: Normal. She exhibits no bony tenderness.       Thoracic back: Normal. She exhibits no bony tenderness.       Lumbar back: Normal. She exhibits no bony tenderness.  No erythema, swelling, or warmth of right hip. Pain with full ROM.  Neurological: She is alert and oriented to person, place, and time. No sensory deficit.  Skin: Skin is warm and dry.  Psychiatric: She has a normal mood and affect. Her behavior is normal.    ED Course  Procedures (including critical care time) Medications - No data to display  Labs Reviewed - No data to display Dg Hip Complete Right  08/16/2012  *RADIOLOGY REPORT*  Clinical Data:  Posterior pelvic pain radiating to anterior hip and groin, no injury  RIGHT HIP - COMPLETE 2+ VIEW  Comparison: None.  Findings: Symmetric hip and SI joints. Osseous mineralization grossly normal for technique. No acute fracture, dislocation, or bone destruction.  IMPRESSION: No acute osseous abnormalities.   Original Report Authenticated By: Ulyses Southward, M.D.      No diagnosis found.    MDM  Patient presenting with right hip pain.  Patient with negative xray.  Full ROM of the hip.  Patient stable for discharge.    I personally performed the services described in this documentation, which was scribed in my presence. The recorded information has been reviewed and is accurate.    Pascal Lux [place], PA-C 08/16/12 2320

## 2012-08-16 NOTE — ED Provider Notes (Signed)
Medical screening examination/treatment/procedure(s) were performed by non-physician practitioner and as supervising physician I was immediately available for consultation/collaboration.   Selina Tapper, MD 08/16/12 2340 

## 2012-08-20 ENCOUNTER — Telehealth: Payer: Self-pay | Admitting: Family Medicine

## 2012-08-20 NOTE — Telephone Encounter (Signed)
Will forward to MD.  Bailey Hooper,  Pt with orange card, so the wait will be long for a ortho referral.  Also pts card expires in July and she will likely not have appt by that time. Will need to renew on time if referral is placed. [place], Bailey Hooper

## 2012-08-20 NOTE — Telephone Encounter (Signed)
Pt want to have appt set up to the [facility] office for eval of hip pain.  Went to [facility] on Sat, had Xray and was told that they did not see anything particular, but may be cause of sciatica issue.  Was offered pain medication, but pat refused.  Want to be evaluated and given specific cause of condition before being tx with any kind of oral or injectible pain medication.

## 2012-08-21 ENCOUNTER — Encounter: Payer: Self-pay | Admitting: Family Medicine

## 2012-08-21 ENCOUNTER — Ambulatory Visit (INDEPENDENT_AMBULATORY_CARE_PROVIDER_SITE_OTHER): Payer: No Typology Code available for payment source | Admitting: Family Medicine

## 2012-08-21 VITALS — BP 142/80 | HR 75 | Temp 98.5°F | Wt 179.0 lb

## 2012-08-21 DIAGNOSIS — I10 Essential (primary) hypertension: Secondary | ICD-10-CM

## 2012-08-21 DIAGNOSIS — M25559 Pain in unspecified hip: Secondary | ICD-10-CM

## 2012-08-21 DIAGNOSIS — M25551 Pain in right hip: Secondary | ICD-10-CM

## 2012-08-21 MED ORDER — TRAMADOL HCL 50 MG PO TABS: 50.0000 mg | ORAL_TABLET | Freq: Three times a day (TID) | ORAL | Status: DC | PRN

## 2012-08-21 NOTE — Patient Instructions (Signed)
Hip Exercises  RANGE OF MOTION (ROM) AND STRETCHING EXERCISES   These exercises may help you when beginning to rehabilitate your injury. Doing them too aggressively can worsen your condition. Complete them slowly and gently. Your symptoms may resolve with or without further involvement from your physician, physical therapist or athletic trainer. While completing these exercises, remember:    Restoring tissue flexibility helps normal motion to return to the joints. This allows healthier, less painful movement and activity.   An effective stretch should be held for at least 30 seconds.   A stretch should never be painful. You should only feel a gentle lengthening or release in the stretched tissue. If these stretches worsen your symptoms even when done gently, consult your physician, physical therapist or athletic trainer.  STRETCH - Hamstrings, Supine    Lie on your back. Loop a belt or towel over the ball of your right / left foot.   Straighten your right / left knee and slowly pull on the belt to raise your leg. Do not allow the right / left knee to bend. Keep your opposite leg flat on the floor.   Raise the leg until you feel a gentle stretch behind your right / left knee or thigh. Hold this position for __________ seconds.  Repeat __________ times. Complete this stretch __________ times per day.   STRETCH - Hip Rotators    Lie on your back on a firm surface. Grasp your right / left knee with your right / left hand and your ankle with your opposite hand.   Keeping your hips and shoulders firmly planted, gently pull your right / left knee and rotate your lower leg toward your opposite shoulder until you feel a stretch in your buttocks.   Hold this stretch for __________ seconds.  Repeat this stretch __________ times. Complete this stretch __________ times per day.  STRETCH - Hamstrings/Adductors, V-Sit    Sit on the floor with your legs extended in a large "V," keeping your knees straight.   With your  head and chest upright, bend at your waist reaching for your right foot to stretch your left adductors.   You should feel a stretch in your left inner thigh. Hold for __________ seconds.   Return to the upright position to relax your leg muscles.   Continuing to keep your chest upright, bend straight forward at your waist to stretch your hamstrings.   You should feel a stretch behind both of your thighs and/or knees. Hold for __________ seconds.   Return to the upright position to relax your leg muscles.   Repeat steps 2 through 4 for opposite leg.  Repeat __________ times. Complete this exercise __________ times per day.   STRETCHING - Hip Flexors, Lunge   Half kneel with your right / left knee on the floor and your opposite knee bent and directly over your ankle.   Keep good posture with your head over your shoulders. Tighten your buttocks to point your tailbone downward; this will prevent your back from arching too much.   You should feel a gentle stretch in the front of your thigh and/or hip. If you do not feel any resistance, slightly slide your opposite foot forward and then slowly lunge forward so your knee once again lines up over your ankle. Be sure your tailbone remains pointed downward.   Hold this stretch for __________ seconds.  Repeat __________ times. Complete this stretch __________ times per day.  STRENGTHENING EXERCISES  These exercises may help   you when beginning to rehabilitate your injury. They may resolve your symptoms with or without further involvement from your physician, physical therapist or athletic trainer. While completing these exercises, remember:    Muscles can gain both the endurance and the strength needed for everyday activities through controlled exercises.   Complete these exercises as instructed by your physician, physical therapist or athletic trainer. Progress the resistance and repetitions only as guided.   You may experience muscle soreness or fatigue, but the  pain or discomfort you are trying to eliminate should never worsen during these exercises. If this pain does worsen, stop and make certain you are following the directions exactly. If the pain is still present after adjustments, discontinue the exercise until you can discuss the trouble with your clinician.  STRENGTH - Hip Extensors, Bridge    Lie on your back on a firm surface. Bend your knees and place your feet flat on the floor.   Tighten your buttocks muscles and lift your bottom off the floor until your trunk is level with your thighs. You should feel the muscles in your buttocks and back of your thighs working. If you do not feel these muscles, slide your feet 1-2 inches further away from your buttocks.   Hold this position for __________ seconds.   Slowly lower your hips to the starting position and allow your buttock muscles relax completely before beginning the next repetition.   If this exercise is too easy, you may cross your arms over your chest.  Repeat __________ times. Complete this exercise __________ times per day.   STRENGTH - Hip Abductors, Straight Leg Raises   Be aware of your form throughout the entire exercise so that you exercise the correct muscles. Sloppy form means that you are not strengthening the correct muscles.   Lie on your side so that your head, shoulders, knee and hip line up. You may bend your lower knee to help maintain your balance. Your right / left leg should be on top.   Roll your hips slightly forward, so that your hips are stacked directly over each other and your right / left knee is facing forward.   Lift your top leg up 4-6 inches, leading with your heel. Be sure that your foot does not drift forward or that your knee does not roll toward the ceiling.   Hold this position for __________ seconds. You should feel the muscles in your outer hip lifting (you may not notice this until your leg begins to tire).   Slowly lower your leg to the starting position. Allow  the muscles to fully relax before beginning the next repetition.  Repeat __________ times. Complete this exercise __________ times per day.   STRENGTH - Hip Adductors, Straight Leg Raises    Lie on your side so that your head, shoulders, knee and hip line up. You may place your upper foot in front to help maintain your balance. Your right / left leg should be on the bottom.   Roll your hips slightly forward, so that your hips are stacked directly over each other and your right / left knee is facing forward.   Tense the muscles in your inner thigh and lift your bottom leg 4-6 inches. Hold this position for __________ seconds.   Slowly lower your leg to the starting position. Allow the muscles to fully relax before beginning the next repetition.  Repeat __________ times. Complete this exercise __________ times per day.   STRENGTH - Quadriceps, Straight Leg   Raises   Quality counts! Watch for signs that the quadriceps muscle is working to insure you are strengthening the correct muscles and not "cheating" by substituting with healthier muscles.   Lay on your back with your right / left leg extended and your opposite knee bent.   Tense the muscles in the front of your right / left thigh. You should see either your knee cap slide up or increased dimpling just above the knee. Your thigh may even quiver.   Tighten these muscles even more and raise your leg 4 to 6 inches off the floor. Hold for right / left seconds.   Keeping these muscles tense, lower your leg.   Relax the muscles slowly and completely in between each repetition.  Repeat __________ times. Complete this exercise __________ times per day.   STRENGTH - Hip Abductors, Standing   Tie one end of a rubber exercise band/tubing to a secure surface (table, pole) and tie a loop at the other end.   Place the loop around your right / left ankle. Keeping your ankle with the band directly opposite of the secured end, step away until there is tension in the  tube/band.   Hold onto a chair as needed for balance.   Keeping your back upright, your shoulders over your hips, and your toes pointing forward, lift your right / left leg out to your side. Be sure to lift your leg with your hip muscles. Do not "throw" your leg or tip your body to lift your leg.   Slowly and with control, return to the starting position.  Repeat exercise __________ times. Complete this exercise __________ times per day.   STRENGTH - Quadriceps, Squats   Stand in a door frame so that your feet and knees are in line with the frame.   Use your hands for balance, not support, on the frame.   Slowly lower your weight, bending at the hips and knees. Keep your lower legs upright so that they are parallel with the door frame. Squat only within the range that does not increase your knee pain. Never let your hips drop below your knees.   Slowly return upright, pushing with your legs, not pulling with your hands.  Document Released: 04/13/2005 Document Revised: 06/18/2011 Document Reviewed: 07/08/2008  [employer] Patient Information 2013 [employer].

## 2012-08-21 NOTE — Assessment & Plan Note (Signed)
  HTN: Compliant with med,likely elevated BP due to pain.     . I rechecked her BP with good improvement.     Continue current BP medication.     RTC in 2 wks for recheck.

## 2012-08-21 NOTE — Assessment & Plan Note (Signed)
HIP PAIN: Xray done 5 days ago reviewed and was normal.                 May be due to weight overbearing on right hip due to leg length discrepancy vs nerve compression syndrome  I gave her refill of tramadol for few more days prn pain.     Home hip exercise instruction was given.     I also referred her to PT.     RTC in few days if no improvement.

## 2012-08-21 NOTE — Progress Notes (Signed)
Subjective:     Patient ID: Bailey Hooper, female   DOB: Jan 09, 1966, 47 y.o.   MRN: [id number]  Hip Pain  Incident onset: Right hip pain for 2 wks now. The incident occurred at home. There was no injury mechanism (She works at [employer] and has to stand for a long time.). The pain is present in the right hip. The pain is at a severity of 7/10. The pain is moderate. The pain has been intermittent since onset. The symptoms are aggravated by movement. Treatments tried: Tramadol and Tylenol,she is almost out of tramadol.  She gave a hx of leg length discrepancy with left LL longer than the right.She went to the ED few days ago,xray done was normal,she was sent home on Tramadol. HTN:She is compliant with her BP medications,last dose of her Clonidine and HCTZ was taken this morning,home BP has been good until she started having hip pain,she denies headache,no vision change,no N/V.  Current Outpatient Prescriptions on File Prior to Visit  Medication Sig Dispense Refill  . albuterol (PROVENTIL HFA;VENTOLIN HFA) 108 (90 BASE) MCG/ACT inhaler Inhale 2 puffs into the lungs every 6 (six) hours as needed for wheezing.       Marland Kitchen ALPRAZolam (XANAX) 0.25 MG tablet Take 0.25 mg by mouth daily as needed for anxiety.      . cloNIDine (CATAPRES) 0.1 MG tablet Take 1 tablet (0.1 mg total) by mouth 2 (two) times daily.  60 tablet  0  . etonogestrel (IMPLANON) 68 MG IMPL implant Inject 1 each into the skin once.      . fluticasone (FLONASE) 50 MCG/ACT nasal spray Place 2 sprays into the nose daily.  16 g  6  . hydrochlorothiazide (HYDRODIURIL) 25 MG tablet Take 1 tablet (25 mg total) by mouth daily.  30 tablet  6  . loratadine (CLARITIN) 10 MG tablet Take 1 tablet (10 mg total) by mouth daily.  30 tablet  11  . lubiprostone (AMITIZA) 8 MCG capsule Take 8 mcg by mouth 2 (two) times daily with a meal.      . nortriptyline (PAMELOR) 25 MG capsule Take 1 tablet at bedtime. If you tolerate this, go up to 2 tablets at bedtime.  60  capsule  0  . pantoprazole (PROTONIX) 40 MG tablet Take 1 tablet (40 mg total) by mouth daily.  30 tablet  6  . SUMAtriptan (IMITREX) 50 MG tablet Take 50 mg by mouth every 2 (two) hours as needed for migraine.        No current facility-administered medications on file prior to visit.   Past Medical History  Diagnosis Date  . Hypertension   . GERD (gastroesophageal reflux disease)   . Anxiety   . Depression   . Migraines   . Sleep apnea   . IBS (irritable bowel syndrome)       Review of Systems  Eyes: Negative for visual disturbance.  Respiratory: Negative.   Cardiovascular: Negative.   Gastrointestinal: Negative.   Musculoskeletal: Positive for arthralgias.  Neurological: Negative.   All other systems reviewed and are negative.       Objective:   Physical Exam  Nursing note and vitals reviewed. Constitutional: She appears well-developed. No distress.  Cardiovascular: Normal rate, regular rhythm, normal heart sounds and intact distal pulses.   No murmur heard. Pulmonary/Chest: Effort normal and breath sounds normal. No respiratory distress. She has no wheezes.  Abdominal: Soft. Bowel sounds are normal. There is no tenderness.  Musculoskeletal:  Right hip: She exhibits decreased range of motion and tenderness. She exhibits no swelling and no crepitus.       Left hip: Normal.       Assessment:     HIP PAIN: Xray done 5 days ago reviewed and was normal.                 May be due to weight overbearing on right hip due to leg length discrepancy vs nerve compression syndrome  HTN: Compliant with med,likely elevated BP due to pain.     Plan:     1. I gave her refill of tramadol for few more days prn pain.     Home hip exercise instruction was given.     I also referred her to PT.     RTC in few days if no improvement.  2. I rechecked her BP with good improvement.     Continue current BP medication.     RTC in 2 wks for recheck.   Total encounter time  more than 30 min.

## 2012-08-22 NOTE — Telephone Encounter (Signed)
Patient seen in clinic 5/15

## 2012-09-10 ENCOUNTER — Ambulatory Visit: Payer: No Typology Code available for payment source | Attending: Family Medicine

## 2012-09-10 DIAGNOSIS — M545 Low back pain, unspecified: Secondary | ICD-10-CM | POA: Insufficient documentation

## 2012-09-10 DIAGNOSIS — M25559 Pain in unspecified hip: Secondary | ICD-10-CM | POA: Insufficient documentation

## 2012-09-10 DIAGNOSIS — IMO0001 Reserved for inherently not codable concepts without codable children: Secondary | ICD-10-CM | POA: Insufficient documentation

## 2012-09-16 ENCOUNTER — Ambulatory Visit: Payer: No Typology Code available for payment source

## 2012-09-22 ENCOUNTER — Telehealth: Payer: Self-pay | Admitting: *Deleted

## 2012-09-22 ENCOUNTER — Ambulatory Visit (INDEPENDENT_AMBULATORY_CARE_PROVIDER_SITE_OTHER): Payer: No Typology Code available for payment source | Admitting: *Deleted

## 2012-09-22 ENCOUNTER — Ambulatory Visit: Payer: No Typology Code available for payment source | Admitting: Physical Therapy

## 2012-09-22 DIAGNOSIS — Z111 Encounter for screening for respiratory tuberculosis: Secondary | ICD-10-CM

## 2012-09-22 NOTE — Telephone Encounter (Signed)
Employment form - staff medical report - for pt placed in your box -please return  to me when complete - she is returning Wed . To have tb test read. Wyatt Haste, RN-BSN

## 2012-09-22 NOTE — Progress Notes (Signed)
PPD Placement note Bailey Hooper, 47 y.o. female is here today for placement of PPD test Reason for PPD test: work Pt taken PPD test before: no Is patient taking any oral or IV steroid medication now or have they taken it in the last month? no Has the patient ever received the BCG vaccine?: no Has the patient been in recent contact with anyone known or suspected of having active TB disease?: no     O: Alert and oriented in NAD. P:  PPD placed on 09/22/2012.  Patient advised to return for reading within 48-72 hours.  Wyatt Haste, RN-BSN

## 2012-09-24 ENCOUNTER — Ambulatory Visit (INDEPENDENT_AMBULATORY_CARE_PROVIDER_SITE_OTHER): Payer: No Typology Code available for payment source | Admitting: *Deleted

## 2012-09-24 ENCOUNTER — Encounter: Payer: Self-pay | Admitting: *Deleted

## 2012-09-24 DIAGNOSIS — Z111 Encounter for screening for respiratory tuberculosis: Secondary | ICD-10-CM

## 2012-09-24 LAB — TB SKIN TEST: TB Skin Test: NEGATIVE

## 2012-09-24 NOTE — Progress Notes (Signed)
PPD Reading Note PPD read and results entered in [employer]. Result: 0 mm induration. Interpretation: negative Papers given and letter with results given to pt. Wyatt Haste, RN-BSN

## 2012-11-11 ENCOUNTER — Ambulatory Visit: Payer: Self-pay

## 2012-11-14 ENCOUNTER — Encounter: Payer: Self-pay | Admitting: Family Medicine

## 2012-12-01 ENCOUNTER — Telehealth: Payer: Self-pay | Admitting: Psychology

## 2012-12-01 NOTE — Telephone Encounter (Signed)
Bailey Hooper left a VM while I was on vacation requesting a beh med appointment.  I called her back and left a VM.  I am not taking new patients right now.  She does not appear to have insurance.  [facility] has a sliding fee schedule that might be helpful.  [name] is another possibility.  Contact info is below:  [facility]: [phone] [facility]:  [phone]

## 2012-12-10 ENCOUNTER — Ambulatory Visit (INDEPENDENT_AMBULATORY_CARE_PROVIDER_SITE_OTHER): Payer: Self-pay | Admitting: Family Medicine

## 2012-12-10 VITALS — BP 171/101 | HR 71 | Temp 98.4°F | Ht 66.0 in | Wt 180.3 lb

## 2012-12-10 DIAGNOSIS — L918 Other hypertrophic disorders of the skin: Secondary | ICD-10-CM | POA: Insufficient documentation

## 2012-12-10 DIAGNOSIS — I10 Essential (primary) hypertension: Secondary | ICD-10-CM

## 2012-12-10 DIAGNOSIS — L909 Atrophic disorder of skin, unspecified: Secondary | ICD-10-CM

## 2012-12-10 NOTE — Patient Instructions (Addendum)
Please make an appointment for a skin tag removal procedure whenever is good for you.

## 2012-12-10 NOTE — Assessment & Plan Note (Signed)
Irritating patient. Patient would like it removed, but has weekend coming up and is concerned about healing in time for her weekend. Would like to postpone visit. Will likely have to numb area, crush base of tag and cut it.

## 2012-12-10 NOTE — Assessment & Plan Note (Signed)
BP higher than usual. PAtient admitted to not taking medications this morning. Advised patient to take her home hctz and clinidine. Recheck BP at next visit.

## 2012-12-10 NOTE — Progress Notes (Signed)
Patient ID: Bailey Hooper    DOB: 1965-05-17, 47 y.o.   MRN: [id number] --- Subjective:  Bailey Hooper is a 47 y.o.female who presents at same day clinic for evaluation for skin tag removal. Skin tag is located on the right buttocks. It has been present for several months but recently has grown in size and has become uncofmortable with rubbing on jeans and pants. No bleeding, no pain but irritation.    ROS: see HPI Past Medical History: reviewed and updated medications and allergies. Social History: Tobacco: smokes  Objective: Filed Vitals:   12/10/12 0921  BP: 171/101  Pulse: 71  Temp: 98.4 F (36.9 C)    Physical Examination:   General appearance - alert, well appearing, and in no distress Skin - skin tag on right buttocks,lateral to perineum. 3mm base, pedunculated.

## 2012-12-22 ENCOUNTER — Encounter: Payer: Self-pay | Admitting: Family Medicine

## 2012-12-22 ENCOUNTER — Ambulatory Visit (INDEPENDENT_AMBULATORY_CARE_PROVIDER_SITE_OTHER): Payer: Self-pay | Admitting: Family Medicine

## 2012-12-22 VITALS — BP 130/78 | HR 80 | Ht 66.0 in | Wt 180.0 lb

## 2012-12-22 DIAGNOSIS — L909 Atrophic disorder of skin, unspecified: Secondary | ICD-10-CM

## 2012-12-22 DIAGNOSIS — R109 Unspecified abdominal pain: Secondary | ICD-10-CM

## 2012-12-22 DIAGNOSIS — M549 Dorsalgia, unspecified: Secondary | ICD-10-CM

## 2012-12-22 DIAGNOSIS — L918 Other hypertrophic disorders of the skin: Secondary | ICD-10-CM

## 2012-12-22 LAB — POCT URINALYSIS DIPSTICK
Ketones, UA: NEGATIVE
Leukocytes, UA: NEGATIVE
Protein, UA: NEGATIVE
Spec Grav, UA: 1.01
Urobilinogen, UA: 0.2
pH, UA: 7

## 2012-12-22 LAB — POCT UA - MICROSCOPIC ONLY

## 2012-12-22 NOTE — Patient Instructions (Addendum)
   For the skin tag site: - keep clean and dry. Keep bandage on for the next 24hrs.  - if you see redness, warmth, worsening pain, fevers, chills or excessive bleeding, come back.

## 2012-12-23 DIAGNOSIS — M549 Dorsalgia, unspecified: Secondary | ICD-10-CM | POA: Insufficient documentation

## 2012-12-23 NOTE — Progress Notes (Signed)
Patient ID: Bailey Hooper    DOB: 15-Apr-1965, 47 y.o.   MRN: [id number] --- Subjective:  Bailey Hooper is a 47 y.o.female who presents for skin tag removal.  Skin tag present on right inner thigh, close to righ buttock. Unchanged from previous visit but more irritated.   ROS: see HPI Otherwise, patient notes some left lower back pain. She is concerned for UTI. She denies dysuria, increased urinary frequency, abdominal pain, nausea or vomiting. No lower extremity weakness.  Past Medical History: reviewed and updated medications and allergies. Social History: Tobacco: daily smoker  Objective: Filed Vitals:   12/22/12 1107  BP: 130/78  Pulse: 80    Physical Examination:   General appearance - alert, well appearing, and in no distress Skin - pedunculated skin tag with ~73mm stalk on right upper inner thigh Abdomen - non tender, non distended, no CVA tenderness  Procedure:   Skin Tag removal:  Performed by: Marena Chancy Consent: Written consent obtained. Risks and benefits: risks, benefits and alternatives were discussed Time out performed prior to procedure Type: skin tag, right upper inner thigh Local anesthetic: lidocaine 1% without epi Anesthetic total: 2 ml Base of skin tag was anesthetised using 30 gauge needle.  Stalk was cut with scissors.  Hemostasis was achieved with silver nitrate.  Pressure dressing applied.  Patient tolerance: Patient tolerated the procedure well with no immediate complications.

## 2012-12-23 NOTE — Assessment & Plan Note (Signed)
UTI vs muscle sprain. Check UA. Follow up with PCP if continues to presist

## 2012-12-24 ENCOUNTER — Ambulatory Visit: Payer: Self-pay

## 2012-12-24 NOTE — Assessment & Plan Note (Signed)
Removed skin tag. Patient tolerated procedure well.  - reviewed red flags for return to care

## 2013-01-29 ENCOUNTER — Ambulatory Visit (HOSPITAL_COMMUNITY): Payer: Self-pay | Admitting: Licensed Clinical Social Worker

## 2013-02-05 ENCOUNTER — Ambulatory Visit (HOSPITAL_COMMUNITY): Payer: Self-pay | Admitting: Licensed Clinical Social Worker

## 2013-02-05 ENCOUNTER — Encounter (HOSPITAL_COMMUNITY): Payer: Self-pay | Admitting: Licensed Clinical Social Worker

## 2013-02-05 NOTE — Progress Notes (Signed)
Patient ID: Bailey Hooper, female   DOB: 1966/01/07, 47 y.o.   MRN: [id number] Patient was a no show no call for her appointment.

## 2013-03-09 ENCOUNTER — Ambulatory Visit: Payer: Self-pay

## 2013-03-16 ENCOUNTER — Emergency Department (INDEPENDENT_AMBULATORY_CARE_PROVIDER_SITE_OTHER)
Admission: EM | Admit: 2013-03-16 | Discharge: 2013-03-16 | Disposition: A | Discharge status: Home / Self Care | Payer: Self-pay | Source: Home / Self Care | Attending: Emergency Medicine | Admitting: Emergency Medicine

## 2013-03-16 ENCOUNTER — Encounter (HOSPITAL_COMMUNITY): Payer: Self-pay | Admitting: Emergency Medicine

## 2013-03-16 DIAGNOSIS — H6690 Otitis media, unspecified, unspecified ear: Secondary | ICD-10-CM

## 2013-03-16 DIAGNOSIS — B9689 Other specified bacterial agents as the cause of diseases classified elsewhere: Secondary | ICD-10-CM

## 2013-03-16 DIAGNOSIS — J019 Acute sinusitis, unspecified: Secondary | ICD-10-CM

## 2013-03-16 MED ORDER — SULFAMETHOXAZOLE-TMP DS 800-160 MG PO TABS: 1.0000 | ORAL_TABLET | Freq: Two times a day (BID) | ORAL | Status: DC

## 2013-03-16 NOTE — ED Provider Notes (Signed)
Medical screening examination/treatment/procedure(s) were performed by a resident physician and as supervising physician I was immediately available for consultation/collaboration.  Additionally, I saw the patient independently, verified the history, examined the patient and discussed the treatment plan with the resident.  Leslee Home, M.D.   Reuben Likes, MD 03/16/13 [phone]

## 2013-03-16 NOTE — ED Provider Notes (Signed)
Bailey Hooper is a 47 y.o. female who presents to Urgent Care today for 2 weeks of symptoms starting as sinus issues, facial discomfort, headache, stuffy nose, rhinorrhea. Symptoms improved after 3-4 days with alka seltzer cold and cough, but then returned 1 week ago with similar symptoms plus chills, postnasal drip with throat pain. After a few days symptoms resolved but returned again 3 nights ago and made it difficult for pt to get rest over weekend with cough, drainage, rhinorrhea, headache. Developed nausea overnight for which she took phenergan and some ear drainage and popping 2 days ago. Also reports a few days ago had a whitish area of malodorous pus on tonsil that cleared on its own. Denies fever, diarrhea, vomiting, dyspnea, or rash.    Past Medical History  Diagnosis Date  . Hypertension   . GERD (gastroesophageal reflux disease)   . Anxiety   . Depression   . Migraines   . Sleep apnea   . IBS (irritable bowel syndrome)    Allergies: Penicillin  History  Substance Use Topics  . Smoking status: Current Every Day Smoker    Types: Cigarettes  . Smokeless tobacco: Not on file     Comment: trying to quitt. does not smoke, when sick  . Alcohol Use: No   ROS as above Medications reviewed. No current facility-administered medications for this encounter.   Current Outpatient Prescriptions  Medication Sig Dispense Refill  . albuterol (PROVENTIL HFA;VENTOLIN HFA) 108 (90 BASE) MCG/ACT inhaler Inhale 2 puffs into the lungs every 6 (six) hours as needed for wheezing.       Marland Kitchen ALPRAZolam (XANAX) 0.25 MG tablet Take 0.25 mg by mouth daily as needed for anxiety.      . cloNIDine (CATAPRES) 0.1 MG tablet Take 1 tablet (0.1 mg total) by mouth 2 (two) times daily.  60 tablet  0  . etonogestrel (IMPLANON) 68 MG IMPL implant Inject 1 each into the skin once.      . fluticasone (FLONASE) 50 MCG/ACT nasal spray Place 2 sprays into the nose daily.  16 g  6  . hydrochlorothiazide (HYDRODIURIL) 25 MG  tablet Take 1 tablet (25 mg total) by mouth daily.  30 tablet  6  . loratadine (CLARITIN) 10 MG tablet Take 1 tablet (10 mg total) by mouth daily.  30 tablet  11  . lubiprostone (AMITIZA) 8 MCG capsule Take 8 mcg by mouth 2 (two) times daily with a meal.      . nortriptyline (PAMELOR) 25 MG capsule Take 1 tablet at bedtime. If you tolerate this, go up to 2 tablets at bedtime.  60 capsule  0  . pantoprazole (PROTONIX) 40 MG tablet Take 1 tablet (40 mg total) by mouth daily.  30 tablet  6  . SUMAtriptan (IMITREX) 50 MG tablet Take 50 mg by mouth every 2 (two) hours as needed for migraine.       . traMADol (ULTRAM) 50 MG tablet Take 1 tablet (50 mg total) by mouth every 8 (eight) hours as needed for pain.  15 tablet  0    Exam:  BP 176/117  Pulse 82  Temp(Src) 98.3 F (36.8 C) (Oral)  Resp 18  SpO2 100% Gen: NAD HEENT: MMM Frontal sinus tenderness TMs clear bilaterally Sclera clear, EOMI, perrl O/p mildly erythematous, no exudate, tonsills only mildly enlarged Anterior cervical shotty lymphadenopathy Lungs: Normal work of breathing. CTABL Heart: RRR, II/VI systolic murmur Exts: Warm and well perfused.  Skin: No rash or cyanosis  No results  found for this or any previous visit (from the past 24 hour(s)). No results found.  Assessment and Plan: 47 y.o. female with likely bacterial sinusitis with symptoms on and off for ~2 weeks. Currently afebrile but with sinus tenderness, cough, and sore throat.  - Will treat with antibiotics. Allergic to penicillin, no insurance so doxy will likely be cost prohibitive. Will treat with bactrim x 10 days. - Recommended inhaled steam, nasal saline spray/NetiPot, rest, fluids, and handwashing. - Sleep with head elevated.  Elevated BP: 180/99 on recheck. No neurologic symptoms. Reports taking antihypertensives this morning. Likely contribution of discomfort. - Continue taking medications regularly. - F/u with PCP for better BP control.  Leona Singleton, MD   Leona Singleton, MD 03/16/13 [phone]

## 2013-03-16 NOTE — ED Notes (Signed)
C/o cold sx States she is nausea, watery eyes, productive cough, sneezing, runny nose, congestion, and drainage Denies vomiting and diarrhea States she has taken otc medication and rx medications States she thinks she needs antibiotics.

## 2013-07-02 ENCOUNTER — Other Ambulatory Visit: Payer: Self-pay | Admitting: Family Medicine

## 2013-07-20 ENCOUNTER — Other Ambulatory Visit: Payer: Self-pay | Admitting: Family Medicine

## 2013-09-25 ENCOUNTER — Ambulatory Visit (INDEPENDENT_AMBULATORY_CARE_PROVIDER_SITE_OTHER): Payer: No Typology Code available for payment source | Admitting: Family Medicine

## 2013-09-25 ENCOUNTER — Encounter: Payer: Self-pay | Admitting: Family Medicine

## 2013-09-25 VITALS — BP 148/93 | HR 78 | Temp 98.4°F | Ht 66.0 in | Wt 180.0 lb

## 2013-09-25 DIAGNOSIS — Z833 Family history of diabetes mellitus: Secondary | ICD-10-CM

## 2013-09-25 DIAGNOSIS — I10 Essential (primary) hypertension: Secondary | ICD-10-CM

## 2013-09-25 DIAGNOSIS — Z01419 Encounter for gynecological examination (general) (routine) without abnormal findings: Secondary | ICD-10-CM

## 2013-09-25 DIAGNOSIS — F329 Major depressive disorder, single episode, unspecified: Secondary | ICD-10-CM

## 2013-09-25 DIAGNOSIS — F3289 Other specified depressive episodes: Secondary | ICD-10-CM

## 2013-09-25 DIAGNOSIS — Z0000 Encounter for general adult medical examination without abnormal findings: Secondary | ICD-10-CM

## 2013-09-25 DIAGNOSIS — Z113 Encounter for screening for infections with a predominantly sexual mode of transmission: Secondary | ICD-10-CM

## 2013-09-25 LAB — LIPID PANEL
CHOLESTEROL: 142 mg/dL (ref 0–200)
HDL: 38 mg/dL — ABNORMAL LOW (ref >39)
LDL Cholesterol: 88 mg/dL (ref 0–99)
TRIGLYCERIDES: 78 mg/dL (ref <150)
Total CHOL/HDL Ratio: 3.7 Ratio
VLDL: 16 mg/dL (ref 0–40)

## 2013-09-25 LAB — BASIC METABOLIC PANEL
BUN: 9 mg/dL (ref 6–23)
CO2: 28 mEq/L (ref 19–32)
CREATININE: 0.67 mg/dL (ref 0.50–1.10)
Calcium: 9.2 mg/dL (ref 8.4–10.5)
Chloride: 101 mEq/L (ref 96–112)
Glucose, Bld: 98 mg/dL (ref 70–99)
POTASSIUM: 3.8 meq/L (ref 3.5–5.3)
Sodium: 136 mEq/L (ref 135–145)

## 2013-09-25 LAB — POCT GLYCOSYLATED HEMOGLOBIN (HGB A1C): Hemoglobin A1C: 5.8

## 2013-09-25 MED ORDER — DULOXETINE HCL 20 MG PO CPEP: 40.0000 mg | ORAL_CAPSULE | Freq: Every day | ORAL | Status: DC

## 2013-09-25 MED ORDER — LORATADINE 10 MG PO TABS: 10.0000 mg | ORAL_TABLET | Freq: Every day | ORAL | Status: DC

## 2013-09-25 MED ORDER — CLONIDINE HCL 0.1 MG PO TABS: 0.1000 mg | ORAL_TABLET | Freq: Two times a day (BID) | ORAL | Status: DC

## 2013-09-25 MED ORDER — HYDROCHLOROTHIAZIDE 25 MG PO TABS: 25.0000 mg | ORAL_TABLET | Freq: Every day | ORAL | Status: DC

## 2013-09-25 MED ORDER — LISINOPRIL-HYDROCHLOROTHIAZIDE 20-25 MG PO TABS: 1.0000 | ORAL_TABLET | Freq: Every day | ORAL | Status: DC

## 2013-09-25 MED ORDER — ALPRAZOLAM 0.25 MG PO TABS: 0.2500 mg | ORAL_TABLET | Freq: Every day | ORAL | Status: DC | PRN

## 2013-09-25 NOTE — Progress Notes (Signed)
Patient ID: Bailey Hooper, female   DOB: [birth year], 48 y.o.   MRN: [id number] Subjective:   CC: Well woman exam  HPI:   Well woman exam As of 09/25/13: Colonoscopy: Not due; has had colonoscopy in 2005 that was normal. Mammogram: Last year, normal except cyst on breast for years.  A1c: FH of DM.  BP: Checks at home - over past 2 weeks in 150/90s so concerned. Takes hydrochlorothiazide and clonidine daily.  Denies chest pain or missing medication doses.  Does not report shortness of breath or dizziness.  STD check: Does not feel at risk for STD; 1 sex partner. Wants HIV/RPR but not GC/Chlamydia tested. [facility]. Lipids:Due for check. Depression screen: Feeling down. Talks to girlfriends. Denies SI/HI Falls risk assessment: None. Smoking status: Down to 3-5 cigs / day. Trying to quit. TDAP, Zostavax, pneumococcal, influenza: Declines flu shot. Regular eye checkups: Not in 3 years.  Dental: Has not seen dentist; does brush and floss regularly.  Review of weight: Losing weight, not trying to.  Review of PMH: reviewed Review of meds: BC: Has nexplanon  Follow up depression Lots going on now and patient feels stressed (work, Engineer, [employer] apt, rent increasing, daughter in unhealthy relationship and lives with patient, along with patient's grandchild). Not sleeping well.  Started cymbalta 4/1 from [facility]. When she thinks she feels better, she does not take it. Anxiety was higher with seroquel 2 week trial so stopped that. Has been diagnosed by [facility] with bipolar d/o but does not agree with that dx. Cannot stay asleep but can sleep during day. Not taking nortriptyline or seroquel. Requests xanax which she has taken in the past for emergency basis when increased stress.  HTN Patient reports BP at home have been "high" in 001V systolic. Does not report chest pain or dyspnea. Takes HCTZ and clonidine. When asked about other medications, she thinks she had a  reaction to one but cannot recall what it is.   Review of Systems - Per HPI.   FH: Father passed last year from dementia and colon cancer, died at 61, dx at 47 FH DM  Smoking status: Everyday smoker    Objective:  Physical Exam BP 148/93  Pulse 78  Temp(Src) 98.4 F (36.9 C) (Oral)  Ht 5\' 6"  (1.676 m)  Wt 180 lb (81.647 kg)  BMI 29.07 kg/m2 GEN: NAD, pleasant HEENT: Atraumatic, normocephalic, neck supple, EOMI, sclera clear, hypertrophied taste buds at posterior tongue; o/p and TM clear, PERRL CV: RRR,  II/VI systolic murmur LUSB, 2+ bilateral radial and DP pulses PULM: CTAB, normal effort ABD: Soft, nontender, nondistended, NABS, no organomegaly SKIN: No rash or cyanosis; warm and well-perfused EXTR: No lower extremity edema or calf tenderness PSYCH: Mood and affect euthymic, normal rate and volume of speech, no SI/HI. NEURO: Awake, alert, no focal deficits grossly, normal speech and gait    Assessment:     Bailey Hooper is a 48 y.o. female with h/o depression, murmur, tobacco use, HTN here for well woman visit and f/u depression.    Plan:     As of 09/25/13: Colonoscopy: Not due; has had colonoscopy in 2005 that was normal. Mammogram: Last year, normal except cyst on breast for years. Plans to get this done within the next few months. A1c: FH of DM. Patient would like this checked today. BP: Checks at home - over past 2 weeks in 150/90s so concerned. Takes hydrochlorothiazide and clonidine daily.  Denies chest pain or missing  medication doses.  Does not report shortness of breath or dizziness. Will refill her hydrochlorothiazide and clonidine and check a BMET today. STD check: Does not feel at risk for STD; 1 sex partner - Get HIV/RPR, does not want GC/Chlamydia or trich testing at this time [facility]. Repeat next year. Lipids: Due for check. Depression screen: Feeling down. Talks to girlfriends. Denies SI/HI Falls risk assessment:  None. Smoking status: Down to 3-5 cigs / day. Trying to quit. - Encouraged. Gave quit line. TDAP, Zostavax, pneumococcal, influenza: Declines flu shot; TDAP due within 10 years. Regular eye checkups: Not in 3 years. Due. Dental: Has not seen dentist; does brush and floss regularly. Set up appt. Review of weight: Losing weight, not trying to. Return for eval. Review of PMH: reviewed Review of meds: - Refill claritin and cymbalta BC: Has nexplanon  Follow up anxiety/depression Consider dx of bipolar with occasional highs and money spending reported. Dx'ed with bipolar by [facility]. Difficulty staying asleep. Trazodone did not help with this in the past.   - Refill cymbalta; f/u with [facility] for further refills. - Xanax #10 for emergency basis if panic attack. - Increasing cymbalta to 40mg  daily.  Pt hesitant to add other medication at this time with h/o increased anxiety with seroquel. - Emphasized that long-term solution for increased stress is antidepressant medication PLUS therapy. Reiterate at f/u. - f/u 6 weeks.  HTN Given reportedly high BPs at home in 630Z systolic, consider adding medication at this time. - BMET - Add lisinopril (rx'ed lisinopril-HCTZ combo). Continue clonidine for now. Long-term, we may wean this off. - Call with which medication caused reaction in the past. - F/u with me at my next available to further evaluate. - Encouraged smoking cessation and provided quit line.  Follow-up: Follow up when convenient at next available for f/u of HTN.   Hilton Sinclair, MD [place]

## 2013-09-25 NOTE — Patient Instructions (Addendum)
Good to see you.  I have refilled medications we discussed. Follow up with me when next available to re-evaluate your HTN. I am checking labs and will call you if there are any that are NOT normal or send a letter. Get your mammogram. Keep working on quitting smoking. Use the phone number I gave you if you feel tempted once you quit. We are increasing cymbalta to 40mg  daily. Take this and only take Xanax if you are having a panic attack. Follow up with me in 6 weeks.  Best,  Hilton Sinclair, MD

## 2013-09-26 LAB — RPR

## 2013-09-26 LAB — HIV ANTIBODY (ROUTINE TESTING W REFLEX): HIV 1&2 Ab, 4th Generation: NONREACTIVE

## 2013-09-28 ENCOUNTER — Telehealth: Payer: Self-pay | Admitting: Family Medicine

## 2013-09-28 NOTE — Telephone Encounter (Signed)
Patient was seen by Dr. Darene Lamer on 09/25/13 and prescribed lisinopril. Patient has been on this before and it caused her cough. She does not want to start on this again. Please advise patient on what med she should take for HTN. She states she will continue to take HCTZ until she hears from MD. Also, she would like her results from lab work.

## 2013-09-29 ENCOUNTER — Telehealth: Payer: Self-pay | Admitting: Family Medicine

## 2013-09-29 ENCOUNTER — Ambulatory Visit (INDEPENDENT_AMBULATORY_CARE_PROVIDER_SITE_OTHER): Payer: No Typology Code available for payment source | Admitting: *Deleted

## 2013-09-29 DIAGNOSIS — Z111 Encounter for screening for respiratory tuberculosis: Secondary | ICD-10-CM

## 2013-09-29 NOTE — Telephone Encounter (Signed)
LMOVM for pt to call back.  Her TB test was done on September 22, 2012.  She can have a copy but if it is for employment, they normally want them done every year.  This one would be out of date and she would more than likely need a new one. [place], Bailey Hooper

## 2013-09-29 NOTE — Telephone Encounter (Signed)
Wants to know when her TB test was done and if she can get the results

## 2013-10-01 ENCOUNTER — Encounter: Payer: Self-pay | Admitting: *Deleted

## 2013-10-01 ENCOUNTER — Ambulatory Visit (INDEPENDENT_AMBULATORY_CARE_PROVIDER_SITE_OTHER): Payer: No Typology Code available for payment source | Admitting: *Deleted

## 2013-10-01 DIAGNOSIS — Z111 Encounter for screening for respiratory tuberculosis: Secondary | ICD-10-CM

## 2013-10-01 LAB — TB SKIN TEST
INDURATION: 0 mm
TB SKIN TEST: NEGATIVE

## 2013-10-05 MED ORDER — LOSARTAN POTASSIUM-HCTZ 100-25 MG PO TABS
1.0000 | ORAL_TABLET | Freq: Every day | ORAL | Status: DC
Start: 2013-10-05 — End: 2013-12-07

## 2013-10-05 MED ORDER — ATORVASTATIN CALCIUM 40 MG PO TABS: 40.0000 mg | ORAL_TABLET | Freq: Every evening | ORAL | Status: DC

## 2013-10-05 NOTE — Telephone Encounter (Addendum)
Left message on voicemail for pt to call back. When she calls, please communicate my message:  Hypertension - Instead of lisinopril, we can try losartan-HCTZ combo which I have sent in. She can try 1/2 tablet daily for 1 3 days and increase to 1 tablet daily if tolerating.  Cholesterol - Her labwork was all normal except her cholesterol and hemoglobin A1c. Her cholesterol puts her 10-year ASCVD risk at 13.3%. This means it would benefit her to start a high-intensity statin like atorvastatin 40mg  daily. Possible side effects are muscle aches, though low risk. I have sent this in as well. This will help lower her risk of a cardiovascular event like heart attack or stroke.  A1c - Her A1c puts her in prediabetic range. For now, I think it is important for her to watch what she eats and keep carbohydrates per meal to no more than 2 servings, and have protein with each meal. She should be careful with things like sweet beverages and sweets.  We should follow-up in clinic to discuss further.  I will call her to discuss.  Hilton Sinclair, MD

## 2013-10-06 ENCOUNTER — Telehealth: Payer: Self-pay | Admitting: Family Medicine

## 2013-10-06 DIAGNOSIS — Z0000 Encounter for general adult medical examination without abnormal findings: Secondary | ICD-10-CM | POA: Insufficient documentation

## 2013-10-06 NOTE — Assessment & Plan Note (Signed)
Consider dx of bipolar with occasional highs and money spending reported. Dx'ed with bipolar by [facility]. Difficulty staying asleep. Trazodone did not help with this in the past.   - Refill cymbalta; f/u with [facility] for further refills. - Xanax #10 for emergency basis if panic attack. - Increasing cymbalta to 40mg  daily.  Pt hesitant to add other medication at this time with h/o increased anxiety with seroquel. - Emphasized that long-term solution for increased stress is antidepressant medication PLUS therapy. Reiterate at f/u. - f/u 6 weeks. PHQ-9 at that time.

## 2013-10-06 NOTE — Assessment & Plan Note (Signed)
Given reportedly high BPs at home in 473U systolic, consider adding medication at this time. - BMET - Add lisinopril (rx'ed lisinopril-HCTZ combo). Continue clonidine for now. Long-term, we may wean this off. - Call with which medication caused reaction in the past. - F/u with me at my next available to further evaluate. - Encouraged smoking cessation and provided quit line.

## 2013-10-06 NOTE — Telephone Encounter (Signed)
Would like lab results. Can only be called during her lunch break. Says Dr T left her a message. She has been calling for 3 days but cant get thru. She says a message can be left on her voicemail

## 2013-10-07 NOTE — Addendum Note (Signed)
Addended by: Conni Slipper T on: 10/07/2013 05:33 PM   Modules accepted: Orders, Medications

## 2013-10-07 NOTE — Telephone Encounter (Signed)
Pt called again. She is very worrried about her test results. Please call her this afternoon.

## 2013-10-07 NOTE — Telephone Encounter (Addendum)
Called to discuss again with patient.  Hypertension - She wants to look up losartan before starting losartan-HCTZ and will let me know what she decides.  Cholesterol - She wants to try dietary control before starting medication. I think this approach is also appropriate and we discussed diet (see below).  - I will d/c atorvastatin for now. - I want to f/u with her in a few weeks to further discuss, and then refer her to Dr Jenne Campus for further dietary guidance.  - I also want to recheck cholesterol in 6 months to make decision re: starting atorvastatin.  A1c- Patient is prediabetic.  - We discussed cutting back on carbs to no more than 2 per meal, and including protein with each meal.  - I also urged her to cut back on sweet tea and juice (which she admits is her weakness).  - I urged her not to cut these out completely as that is very difficult to do and not sustainable.  Low vitamin D - An issue for patient in the past. She would like this checked at a f/u visit.   Hilton Sinclair, MD

## 2013-10-07 NOTE — Telephone Encounter (Signed)
Called to discuss.  See my other phone note for details.  Hilton Sinclair, MD

## 2013-12-07 ENCOUNTER — Other Ambulatory Visit: Payer: Self-pay | Admitting: *Deleted

## 2013-12-09 MED ORDER — LOSARTAN POTASSIUM-HCTZ 100-25 MG PO TABS: 1.0000 | ORAL_TABLET | Freq: Every day | ORAL | Status: DC

## 2013-12-10 ENCOUNTER — Other Ambulatory Visit: Payer: Self-pay | Admitting: *Deleted

## 2013-12-10 NOTE — Telephone Encounter (Signed)
It appears that her lipitor was recently discontinued. Could you please call the patient to find out if she has continued to take this medication and if she has I can refill it for her. Thanks.

## 2013-12-11 ENCOUNTER — Encounter: Payer: Self-pay | Admitting: Family Medicine

## 2013-12-11 ENCOUNTER — Other Ambulatory Visit: Payer: Self-pay | Admitting: Family Medicine

## 2013-12-11 ENCOUNTER — Ambulatory Visit (INDEPENDENT_AMBULATORY_CARE_PROVIDER_SITE_OTHER): Payer: No Typology Code available for payment source | Admitting: Family Medicine

## 2013-12-11 VITALS — BP 139/86 | HR 71 | Temp 98.2°F | Wt 176.6 lb

## 2013-12-11 DIAGNOSIS — L03119 Cellulitis of unspecified part of limb: Secondary | ICD-10-CM

## 2013-12-11 DIAGNOSIS — L02419 Cutaneous abscess of limb, unspecified: Secondary | ICD-10-CM

## 2013-12-11 DIAGNOSIS — K089 Disorder of teeth and supporting structures, unspecified: Secondary | ICD-10-CM

## 2013-12-11 DIAGNOSIS — L0293 Carbuncle, unspecified: Principal | ICD-10-CM

## 2013-12-11 DIAGNOSIS — L0292 Furuncle, unspecified: Secondary | ICD-10-CM

## 2013-12-11 MED ORDER — CHLORHEXIDINE GLUCONATE 4 % EX LIQD: Freq: Every day | CUTANEOUS | Status: DC | PRN

## 2013-12-11 MED ORDER — CEPHALEXIN 500 MG PO CAPS: 500.0000 mg | ORAL_CAPSULE | Freq: Three times a day (TID) | ORAL | Status: DC

## 2013-12-11 NOTE — Assessment & Plan Note (Signed)
Previously with many boils in groin and axillae, now with ankle and thumb - hibiclens baths

## 2013-12-11 NOTE — Progress Notes (Signed)
Will forward to Dr. Caryl Bis who is covering Dr. Mcneil Sober box.  Jazmin Hartsell,CMA

## 2013-12-11 NOTE — Progress Notes (Signed)
Pt stated that the upper rt teeth has an hole in it and she needs an dentist referral Cherl says that she can be reached at [phone] or [phone].

## 2013-12-11 NOTE — Progress Notes (Signed)
Patient ID: Bailey Hooper, female   DOB: [birth year], 48 y.o.   MRN: [id number] Patient requesting referral to dentist. Will place this referral. Please advise the patient if she is having dental pain or fever she needs to be seen by a physician. Thanks.

## 2013-12-11 NOTE — Assessment & Plan Note (Signed)
Small soft tissue abscess on medial left ankle, not likely to be infected at this point but is quite tender, healing well with some overlying callous - keflex x7 days - warm compresses - f/u in 2 weeks if not resolving - ok to file down callous when tenderness resolves

## 2013-12-11 NOTE — Patient Instructions (Signed)
I am prescribing an antibiotic for your boil. If it does not get better in the next 1-2 weeks, please come back because it may need to be drained.  I am also prescribing hibiclens, an antibacterial solution that helps with preventing boils. You can Skirvin it in with bath water or just apply to affected areas.

## 2013-12-11 NOTE — Progress Notes (Signed)
   Subjective:    Patient ID: Bailey Hooper, female    DOB: 06/04/1965, 48 y.o.   MRN: [id number]  HPI Patient presents for painful boil on medial left ankle for the past 2 weeks. Initially was swollen and tender, was able to express purulent fluid last week. Since then is healing but still very tender. Has developed overlying callous.  She gets many boils and wants to know how to prevent them.   Review of Systems No fever. See HPI    Objective:   Physical Exam  Nursing note and vitals reviewed. Constitutional: She is oriented to person, place, and time. She appears well-developed and well-nourished. No distress.  HENT:  Head: Normocephalic and atraumatic.  Eyes: Conjunctivae are normal. Right eye exhibits no discharge. Left eye exhibits no discharge. No scleral icterus.  Cardiovascular: Normal rate.   Pulmonary/Chest: Effort normal.  Neurological: She is alert and oriented to person, place, and time.  Skin: Skin is warm and dry. Lesion noted. No rash noted. She is not diaphoretic.     Psychiatric: She has a normal mood and affect. Her behavior is normal.          Assessment & Plan:

## 2013-12-15 NOTE — Progress Notes (Signed)
Spoke with patient and she states that her insurance doesn't cover dental.  She was under the impression that our office would help cover some of the cost.  Informed patient that we didn't do this and that if her insurance didn't pay for her dental services then she would have to pay out of pocket.  Pt will call around to check prices with different dental offices. Neasia Fleeman,CMA

## 2013-12-15 NOTE — Progress Notes (Signed)
Pt wants a referral to the dental clinic

## 2013-12-15 NOTE — Progress Notes (Signed)
LM for patient to call back.  She informed me earlier today that her insurance doesn't cover dental services.  Pt was advised to call around to different dentist to check prices since she is going to be self pay.  She voiced understanding at our last talk.  When patient calls back please remind her of the conversation we had.  Thanks [employer]

## 2013-12-16 ENCOUNTER — Encounter: Payer: Self-pay | Admitting: Family Medicine

## 2013-12-16 ENCOUNTER — Ambulatory Visit (INDEPENDENT_AMBULATORY_CARE_PROVIDER_SITE_OTHER): Payer: No Typology Code available for payment source | Admitting: Family Medicine

## 2013-12-16 VITALS — BP 155/96 | HR 81 | Temp 98.2°F | Wt 176.0 lb

## 2013-12-16 DIAGNOSIS — L02419 Cutaneous abscess of limb, unspecified: Secondary | ICD-10-CM

## 2013-12-16 DIAGNOSIS — N898 Other specified noninflammatory disorders of vagina: Secondary | ICD-10-CM

## 2013-12-16 DIAGNOSIS — L03119 Cellulitis of unspecified part of limb: Secondary | ICD-10-CM

## 2013-12-16 DIAGNOSIS — L218 Other seborrheic dermatitis: Secondary | ICD-10-CM

## 2013-12-16 DIAGNOSIS — L219 Seborrheic dermatitis, unspecified: Secondary | ICD-10-CM

## 2013-12-16 MED ORDER — KETOCONAZOLE 2 % EX SHAM: 1.0000 "application " | MEDICATED_SHAMPOO | CUTANEOUS | Status: DC

## 2013-12-16 MED ORDER — FLUCONAZOLE 150 MG PO TABS: 150.0000 mg | ORAL_TABLET | ORAL | Status: DC

## 2013-12-16 MED ORDER — FLUOCINOLONE ACETONIDE 0.01 % EX SHAM: MEDICATED_SHAMPOO | CUTANEOUS | Status: DC

## 2013-12-16 NOTE — Patient Instructions (Addendum)
Nice to meet you. Please use the steroid shampoo on your scalp. If your discharge does not improve with the diflucan please return to the office for a visit.

## 2013-12-16 NOTE — Assessment & Plan Note (Signed)
Does not appear infected at this time with no erythema. No area of fluctuance to lance. Patient to continue keflex. To use warm compresses. Stop peroxide. F/u if not improved once completes antibiotic.

## 2013-12-16 NOTE — Progress Notes (Addendum)
Patient ID: Ericca Labra, female   DOB: 1965-12-03, 48 y.o.   MRN: [id number]  Tommi Rumps, MD Phone: [phone]  Devorah Givhan is a 48 y.o. female who presents today for same day appointment.  Seborrheic dermatitis of the scalp: noted for the past 6 months has had flaking and itching in her hair. If she does not wash her hair for 4-5 days she gets large flakes and yellow flakes. She tried head and shoulders and selsun blue. She tried her granddaughters cradle cap shampoo which she believes was ketoconazole shampoo. It has not improved.  Vaginal discharge: patient recently placed on antibiotic for a boil on her foot. She notes every time she takes an antibiotic she gets a yeast infection. She notes thick cottage cheese discharge, itching, and irritation. No abdominal pain. Would like a diflucan prescription. Declines pelvic exam today.  Boil: left mid foot. Notes is still painful. Is half way through keflex. No erythema or discharge. Has been using neosporin and peroxide. Has not worsened.  Patient is a smoker.   ROS: Per HPI   Physical Exam Filed Vitals:   12/16/13 1016  BP: 155/96  Pulse: 81  Temp: 98.2 F (36.8 C)    Gen: Well NAD HEENT: PERRL,  MMM Lungs: CTABL Nl WOB Heart: RRR no MRG Skin: scalp with diffuse yellow flaking, left foot with 1 cm firm nodule with overlying callus, no surrounding erythema or induration, no fluctuance Exts: Non edematous BL  LE, warm and well perfused.    Assessment/Plan: Please see individual problem list.   Tommi Rumps, MD [place] PGY-3

## 2013-12-17 DIAGNOSIS — L219 Seborrheic dermatitis, unspecified: Secondary | ICD-10-CM | POA: Insufficient documentation

## 2013-12-17 DIAGNOSIS — N898 Other specified noninflammatory disorders of vagina: Secondary | ICD-10-CM | POA: Insufficient documentation

## 2013-12-17 NOTE — Assessment & Plan Note (Addendum)
Patient with classic findings of seborrheic dermatitis. Has not responded to previous home treatments and possibly to ketoconazole shampoo. Will give trial of fluocinolone shampoo. If not improving patient is to follow-up with her PCP.

## 2013-12-17 NOTE — Assessment & Plan Note (Signed)
Given patient description of discharge and time course with antibiotics this is likely a yeast infection. The patient declined pelvic exam to confirm this. Will treat with diflucan 150 mg q3 days x2 doses. F/u if does not improve or if worsens.

## 2013-12-20 NOTE — Telephone Encounter (Signed)
Blue team can you help with this? Thanks. Hilton Sinclair, MD

## 2013-12-21 NOTE — Telephone Encounter (Signed)
LMOVM for pt to return call .Bailey Hooper  

## 2013-12-22 ENCOUNTER — Telehealth: Payer: Self-pay | Admitting: *Deleted

## 2013-12-22 MED ORDER — FLUOCINONIDE 0.05 % EX OINT: 1.0000 "application " | TOPICAL_OINTMENT | Freq: Two times a day (BID) | CUTANEOUS | Status: DC

## 2013-12-22 NOTE — Telephone Encounter (Signed)
Received fax from [facility] needing a new Rx; Capex shampoo is not covered by pt's insurance.  Called [facility] and there is no shampoo that is covered by patient's insurance.  Derl Barrow, RN

## 2013-12-22 NOTE — Telephone Encounter (Addendum)
Prescription sent in for lidex to apply to the scaly areas in her scalp. She should also use selsun blue shampoo every day for the next 2-4 weeks. She needs to follow-up with her PCP if this is not improving.

## 2013-12-22 NOTE — Telephone Encounter (Signed)
Left voice message informing pt of medication change and to use Selsun blue shampoo.  Pt to call for follow up appt if no improvement.  Derl Barrow, RN

## 2013-12-22 NOTE — Addendum Note (Signed)
Addended by: Leone Haven on: 12/22/2013 03:49 PM   Modules accepted: Orders

## 2014-01-21 ENCOUNTER — Telehealth: Payer: Self-pay | Admitting: Family Medicine

## 2014-01-21 NOTE — Telephone Encounter (Signed)
Pt called and has some questions about her medication. Lipitor, she was taking this and now she has not been able to refill this. She is wanting to know if she is suppose to be taking this or are we going to give her something else. If she is suppose to be taking this or if we are going to replace it with something else can we send it to the mail order pharmacy and make sure that it is in 90 day supply. Also in the future please make sure that ALL medications are sent in as 90 day since it is a lot cheaper for her. Please call her with any questions. jw

## 2014-01-24 MED ORDER — ATORVASTATIN CALCIUM 40 MG PO TABS: 40.0000 mg | ORAL_TABLET | Freq: Every evening | ORAL | Status: DC

## 2014-01-24 NOTE — Telephone Encounter (Signed)
At our June visit, she had told me she wanted dietary control before taking lipitor.That was an appropriate choice, so we discontinued atorvastatin. We had planned to f/u in a few weeks to further discuss and refer to Dr Jenne Campus for dietary guidance but I have not seen her again (she has seen other providers for acute issues). Dr Caryl Bis had gotten a refill request but was not sure if she was supposed to be on it so a voicemail was left but we did not hear back from her about it. I am happy to restart the lipitor if that is what she has decided to do - it is a good choice.   I have sent it in if she would like to restart it. We should recheck lipids in Dec. THanks,  Hilton Sinclair, MD

## 2014-01-25 MED ORDER — ATORVASTATIN CALCIUM 40 MG PO TABS: 40.0000 mg | ORAL_TABLET | Freq: Every evening | ORAL | Status: DC

## 2014-01-25 NOTE — Addendum Note (Signed)
Addended by: Valerie Roys on: 01/25/2014 09:55 AM   Modules accepted: Orders

## 2014-01-25 NOTE — Telephone Encounter (Signed)
Resent medication to mail order pharmacy. Terren Jandreau,CMA

## 2014-01-31 ENCOUNTER — Encounter (HOSPITAL_COMMUNITY): Payer: Self-pay | Admitting: Emergency Medicine

## 2014-01-31 DIAGNOSIS — Z792 Long term (current) use of antibiotics: Secondary | ICD-10-CM | POA: Insufficient documentation

## 2014-01-31 DIAGNOSIS — Y92410 Unspecified street and highway as the place of occurrence of the external cause: Secondary | ICD-10-CM | POA: Insufficient documentation

## 2014-01-31 DIAGNOSIS — F419 Anxiety disorder, unspecified: Secondary | ICD-10-CM | POA: Insufficient documentation

## 2014-01-31 DIAGNOSIS — F329 Major depressive disorder, single episode, unspecified: Secondary | ICD-10-CM | POA: Insufficient documentation

## 2014-01-31 DIAGNOSIS — Z720 Tobacco use: Secondary | ICD-10-CM | POA: Insufficient documentation

## 2014-01-31 DIAGNOSIS — Z79899 Other long term (current) drug therapy: Secondary | ICD-10-CM | POA: Insufficient documentation

## 2014-01-31 DIAGNOSIS — K219 Gastro-esophageal reflux disease without esophagitis: Secondary | ICD-10-CM | POA: Diagnosis not present

## 2014-01-31 DIAGNOSIS — Z7952 Long term (current) use of systemic steroids: Secondary | ICD-10-CM | POA: Diagnosis not present

## 2014-01-31 DIAGNOSIS — Z880 Allergy status to penicillin: Secondary | ICD-10-CM | POA: Insufficient documentation

## 2014-01-31 DIAGNOSIS — Y9389 Activity, other specified: Secondary | ICD-10-CM | POA: Diagnosis not present

## 2014-01-31 DIAGNOSIS — I10 Essential (primary) hypertension: Secondary | ICD-10-CM | POA: Diagnosis not present

## 2014-01-31 DIAGNOSIS — S199XXA Unspecified injury of neck, initial encounter: Secondary | ICD-10-CM | POA: Insufficient documentation

## 2014-01-31 DIAGNOSIS — G43909 Migraine, unspecified, not intractable, without status migrainosus: Secondary | ICD-10-CM | POA: Diagnosis not present
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
## 2014-02-01 ENCOUNTER — Telehealth: Payer: Self-pay | Admitting: Family Medicine

## 2014-02-01 ENCOUNTER — Emergency Department (HOSPITAL_COMMUNITY)
Admission: EM | Admit: 2014-02-01 | Discharge: 2014-02-01 | Disposition: A | Discharge status: Home / Self Care | Payer: No Typology Code available for payment source | Attending: Emergency Medicine | Admitting: Emergency Medicine

## 2014-02-01 DIAGNOSIS — S199XXA Unspecified injury of neck, initial encounter: Secondary | ICD-10-CM | POA: Diagnosis not present

## 2014-02-01 MED ORDER — TRAMADOL HCL 50 MG PO TABS
50.0000 mg | ORAL_TABLET | Freq: Once | ORAL | Status: AC
  Administered 2014-02-01: 50 mg via ORAL
  Filled 2014-02-01: qty 1

## 2014-02-01 MED ORDER — DIAZEPAM 2 MG PO TABS: 2.0000 mg | ORAL_TABLET | Freq: Two times a day (BID) | ORAL | Status: DC | PRN

## 2014-02-01 MED ORDER — TRAMADOL HCL 50 MG PO TABS
50.0000 mg | ORAL_TABLET | Freq: Once | ORAL | Status: DC
  Filled 2014-02-01: qty 1

## 2014-02-01 MED ORDER — TRAMADOL HCL 50 MG PO TABS: 50.0000 mg | ORAL_TABLET | Freq: Two times a day (BID) | ORAL | Status: DC | PRN

## 2014-02-01 NOTE — ED Notes (Signed)
MD at bedside. 

## 2014-02-01 NOTE — Telephone Encounter (Signed)
Pt's HTN meds recently increased. Pt states that BP is still running high, averaging 150/90. She wanted to make an appt to see Dr T but she does not have anything for about 3 weeks. Patient would like to know if she should increase meds again? Also, pt requesting eye drops for her allergies. She has tried OTC eye drops, which did not help. Please call patient's cell phone and leave all instructions on her voicemail. Pt is at work and cannot answer the phone. Please advise.
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
## 2014-02-02 MED ORDER — OLOPATADINE HCL 0.2 % OP SOLN: 1.0000 [drp] | Freq: Every day | OPHTHALMIC | Status: DC

## 2014-02-02 NOTE — Telephone Encounter (Addendum)
Please let her know that we had added lisinopril in June and need to check BMET via bloodwork before making further changes. Because my schedule is full, she can see any provider on [facility] for HTN f/u to decide on further med changes. I would like her to be seen in the next 1-2 weeks.   For her eyes, I sent in olopatadine which is for allergic conjunctivitis (allergic itchy eyes). She can try this and if it is not helping in 1-2 weeks, we will need to f/u. If she has red, painful, blurry eyes or other concerns, I would worry this is something else and she needs to be seen in clinic. Thanks.  Hilton Sinclair, MD

## 2014-02-03 NOTE — Telephone Encounter (Signed)
LM for patient to call back and schedule an appt with a [facility] team MD.  Bailey Hooper

## 2014-02-12 ENCOUNTER — Encounter: Payer: Self-pay | Admitting: Family Medicine

## 2014-02-12 ENCOUNTER — Other Ambulatory Visit (HOSPITAL_COMMUNITY)
Admission: RE | Admit: 2014-02-12 | Discharge: 2014-02-12 | Disposition: A | Discharge status: Home / Self Care | Payer: No Typology Code available for payment source | Source: Ambulatory Visit | Attending: Family Medicine | Admitting: Family Medicine

## 2014-02-12 ENCOUNTER — Ambulatory Visit (INDEPENDENT_AMBULATORY_CARE_PROVIDER_SITE_OTHER): Payer: No Typology Code available for payment source | Admitting: Family Medicine

## 2014-02-12 VITALS — BP 155/97 | HR 80 | Temp 98.1°F | Wt 181.0 lb

## 2014-02-12 DIAGNOSIS — Z1151 Encounter for screening for human papillomavirus (HPV): Secondary | ICD-10-CM | POA: Diagnosis present

## 2014-02-12 DIAGNOSIS — N898 Other specified noninflammatory disorders of vagina: Secondary | ICD-10-CM

## 2014-02-12 DIAGNOSIS — Z01419 Encounter for gynecological examination (general) (routine) without abnormal findings: Secondary | ICD-10-CM | POA: Insufficient documentation

## 2014-02-12 DIAGNOSIS — Z124 Encounter for screening for malignant neoplasm of cervix: Secondary | ICD-10-CM

## 2014-02-12 DIAGNOSIS — N760 Acute vaginitis: Secondary | ICD-10-CM | POA: Diagnosis present

## 2014-02-12 DIAGNOSIS — Z113 Encounter for screening for infections with a predominantly sexual mode of transmission: Secondary | ICD-10-CM | POA: Insufficient documentation

## 2014-02-12 DIAGNOSIS — N899 Noninflammatory disorder of vagina, unspecified: Secondary | ICD-10-CM

## 2014-02-12 LAB — POCT WET PREP (WET MOUNT): CLUE CELLS WET PREP WHIFF POC: POSITIVE

## 2014-02-12 MED ORDER — FLUCONAZOLE 150 MG PO TABS: 150.0000 mg | ORAL_TABLET | Freq: Once | ORAL | Status: DC

## 2014-02-12 MED ORDER — METRONIDAZOLE 500 MG PO TABS: 500.0000 mg | ORAL_TABLET | Freq: Two times a day (BID) | ORAL | Status: DC

## 2014-02-12 MED ORDER — NYSTATIN 100000 UNIT/GM EX POWD: CUTANEOUS | Status: DC

## 2014-02-12 NOTE — Patient Instructions (Signed)
It was nice to meet you today!  For area on skin: -nystatin powder topical twice a day for 7 days (not on mucous membrane areas) -diflucan 150mg  x 1, repeat in 3 days if not better -keep clean and dry  For nodule in vagina: checking ultrasound.   I will call you if your test results are not normal.  Otherwise, I will send you a letter.  If you do not hear from me with in 2 weeks please call our office.      Be well, Dr. Ardelia Mems

## 2014-02-15 LAB — CYTOLOGY - PAP

## 2014-02-16 ENCOUNTER — Telehealth: Payer: Self-pay | Admitting: *Deleted

## 2014-02-16 MED ORDER — CLOTRIMAZOLE 1 % EX CREA: 1.0000 "application " | TOPICAL_CREAM | Freq: Two times a day (BID) | CUTANEOUS | Status: DC

## 2014-02-16 NOTE — Telephone Encounter (Signed)
Pt called back. She is very uncomfortable and would like a call back

## 2014-02-16 NOTE — Telephone Encounter (Signed)
Pt called stating she does not think that the nystatin power that was prescribed is helping.  She is still having the same problems.  She is requesting something different.  Derl Barrow, RN

## 2014-02-16 NOTE — Telephone Encounter (Signed)
Patient informed, expressed understanding. 

## 2014-02-16 NOTE — Telephone Encounter (Signed)
Will forward to MD who prescribed and PCP

## 2014-02-16 NOTE — Telephone Encounter (Signed)
Sent in new rx for clotrimazole cream. Ask pt to try this instead of nystatin powder. If this doesn't help pt should be seen again for repeat exam. Thanks, Leeanne Rio, MD

## 2014-02-18 ENCOUNTER — Ambulatory Visit (HOSPITAL_COMMUNITY): Payer: No Typology Code available for payment source

## 2014-02-18 DIAGNOSIS — N899 Noninflammatory disorder of vagina, unspecified: Secondary | ICD-10-CM | POA: Insufficient documentation

## 2014-02-18 NOTE — Assessment & Plan Note (Signed)
Located anterior to cervix along vaginal wall. Etiology not clear, possibly just a vaginal cyst. Not visualized with speculum. Will obtain ultrasound of pelvic organs to further characterize. May eventually require referral to OB/GYN for further evaluation.

## 2014-02-18 NOTE — Assessment & Plan Note (Signed)
1. Vulvar irritation - appears to be topical yeast infection. Despite hx of vulvar boils, no tissue induration or fluctuance today to suggest abscess or bacterial cellulitis at this point. Will rx nystatin powder BID x 7 days to the skin area (counseled not to use on mucous membranes). Also rx diflucan 150mg  x1 with instructions to repeat in 3 days if not better. Instructed to keep area clean and dry. F/u if not improving.  2. Vaginal discharge - wet prep, gc/chlamydia done today. Wet prep shows BV - will rx flagyl. Pap smear also done today since we were doing pelvic exam anyways and pt was due.

## 2014-02-18 NOTE — Progress Notes (Signed)
Patient ID: Bailey Hooper, female   DOB: 1965/11/25, 48 y.o.   MRN: [id number]  HPI:  Pt presents for a same day appointment to discuss possible boil in groin area.  Has hx of boils in vulvar area. Noticed this spot about 4 days ago. It's on the R side. Has tried keflex course (had a refill on this) and triple antibiotic ointment. No fevers but has felt fatigued. No hx of immunocompromise. It's felt warm to the touch and has been draining for a few days. Has also had vaginal irritation and discharge, and some itching. Willing to have STD testing done today.   ROS: See HPI  [place]: hx GERD, asthma, vit D deficiency, IBS, anxiety, HTN  PHYSICAL EXAM: BP 155/97 mmHg  Pulse 80  Temp(Src) 98.1 F (36.7 C) (Oral)  Wt 181 lb (82.101 kg) Gen: NAD HEENT: NCAT Lungs: NWOB Abdomen: soft, NTTP GU: normal appearing external genitalia with area of slightly macerated tissue on R vulva lateral to vagina. Some skin breakdown with weeping or clear fluid. No induration, fluctuance, or nodularity to this area. Mild erythema. Vagina is moist with normal discharge. Cervix normal in appearance. No cervical motion tenderness or tenderness on bimanual exam. No adnexal masses. A jelly-bean sized nontender nodule is palpable along the anterior vaginal wall, anterior and slightly distal to the cervix. Neuro: grossly nonfocal speech normal  ASSESSMENT/PLAN:  Vaginal discharge 1. Vulvar irritation - appears to be topical yeast infection. Despite hx of vulvar boils, no tissue induration or fluctuance today to suggest abscess or bacterial cellulitis at this point. Will rx nystatin powder BID x 7 days to the skin area (counseled not to use on mucous membranes). Also rx diflucan 150mg  x1 with instructions to repeat in 3 days if not better. Instructed to keep area clean and dry. F/u if not improving.  2. Vaginal discharge - wet prep, gc/chlamydia done today. Wet prep shows BV - will rx flagyl. Pap smear also done today since  we were doing pelvic exam anyways and pt was due.  Nodule of vagina Located anterior to cervix along vaginal wall. Etiology not clear, possibly just a vaginal cyst. Not visualized with speculum. Will obtain ultrasound of pelvic organs to further characterize. May eventually require referral to OB/GYN for further evaluation.     FOLLOW UP: F/u as needed if symptoms worsen or do not improve.   [place]. Ardelia Mems, [place]

## 2014-02-19 ENCOUNTER — Ambulatory Visit (HOSPITAL_COMMUNITY)
Admission: RE | Admit: 2014-02-19 | Discharge: 2014-02-19 | Disposition: A | Discharge status: Home / Self Care | Payer: No Typology Code available for payment source | Source: Ambulatory Visit | Attending: Family Medicine | Admitting: Family Medicine

## 2014-02-19 DIAGNOSIS — N899 Noninflammatory disorder of vagina, unspecified: Secondary | ICD-10-CM

## 2014-02-19 DIAGNOSIS — N8320 Unspecified ovarian cysts: Secondary | ICD-10-CM | POA: Insufficient documentation

## 2014-02-19 DIAGNOSIS — N911 Secondary amenorrhea: Secondary | ICD-10-CM | POA: Diagnosis not present

## 2014-02-19 DIAGNOSIS — N9489 Other specified conditions associated with female genital organs and menstrual cycle: Secondary | ICD-10-CM | POA: Diagnosis present

## 2014-02-19 DIAGNOSIS — Z8679 Personal history of other diseases of the circulatory system: Secondary | ICD-10-CM | POA: Diagnosis not present

## 2014-02-19 DIAGNOSIS — D259 Leiomyoma of uterus, unspecified: Secondary | ICD-10-CM | POA: Diagnosis not present

## 2014-02-21 ENCOUNTER — Emergency Department (HOSPITAL_COMMUNITY)
Admission: EM | Admit: 2014-02-21 | Discharge: 2014-02-21 | Disposition: A | Discharge status: Home / Self Care | Payer: No Typology Code available for payment source | Source: Home / Self Care | Attending: Emergency Medicine | Admitting: Emergency Medicine

## 2014-02-21 ENCOUNTER — Encounter (HOSPITAL_COMMUNITY): Payer: Self-pay | Admitting: *Deleted

## 2014-02-21 DIAGNOSIS — B373 Candidiasis of vulva and vagina: Secondary | ICD-10-CM

## 2014-02-21 DIAGNOSIS — B3731 Acute candidiasis of vulva and vagina: Secondary | ICD-10-CM

## 2014-02-21 HISTORY — DX: Pure hypercholesterolemia, unspecified: E78.00

## 2014-02-21 HISTORY — DX: Unspecified asthma, uncomplicated: J45.909

## 2014-02-21 MED ORDER — FLUCONAZOLE 150 MG PO TABS: 150.0000 mg | ORAL_TABLET | Freq: Every day | ORAL | Status: DC

## 2014-02-21 MED ORDER — NYSTATIN 100000 UNIT/GM EX CREA: TOPICAL_CREAM | CUTANEOUS | Status: DC

## 2014-02-21 MED ORDER — HYDROCORTISONE 2.5 % EX CREA: TOPICAL_CREAM | Freq: Two times a day (BID) | CUTANEOUS | Status: DC

## 2014-02-21 NOTE — ED Provider Notes (Signed)
CSN: [id number]     Arrival date & time 02/21/14  1221 History   First MD Initiated Contact with Patient 02/21/14 1236     Chief Complaint  Patient presents with  . Vaginal Itching   (Consider location/radiation/quality/duration/timing/severity/associated sxs/prior Treatment) HPI  She is a 48 year old woman here for evaluation of vulvar itching. 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 About a week ago, she was seen at her PCPs office for vaginal discharge as well as pain and itching in the vulvar area. At that time she was placed on Flagyl for BV as well as Diflucan for yeast. She was also given nystatin powder, then clotrimazole cream to use on the vulvar area. She states some areas seem to have improved, while others have worsened. There continues to be significant itching and pain. There is also a fair amount of clear drainage. She has been using a panty liner which does not seem to be helping much. She is wearing white cotton underwear and changing her clothes frequently. She has been using an antibacterial soap in the area.  Past Medical History  Diagnosis Date  . Hypertension   . GERD (gastroesophageal reflux disease)   . Anxiety   . Depression   . Migraines   . Sleep apnea   . IBS (irritable bowel syndrome)   . Asthma   . High cholesterol    Past Surgical History  Procedure Laterality Date  . Foot surgery      Left foot  . Cesarean section     Family History  Problem Relation Age of Onset  . Colon cancer      Father  . Cancer      Oral, uncle  . Alcohol abuse      Father  . Drug abuse      Father  . Osteoporosis    . Depression    . Hypertension    . Diabetes    . Heart disease      Grandmother   History  Substance Use Topics  . Smoking status: Current Every Day Smoker -- 0.25 packs/day    Types: Cigarettes  . Smokeless tobacco: Not on file     Comment: trying to quitt. does  not smoke, when sick  . Alcohol Use: No   OB History    No data available     Review of Systems  Constitutional: Positive for fatigue. Negative for fever and chills.  Skin: Positive for rash.    Allergies  Erythromycin; Lisinopril; and Penicillins  Home Medications   Prior to Admission medications   Medication Sig Start Date End Date Taking? Authorizing Provider  ALPRAZolam (XANAX) 0.25 MG tablet Take 1 tablet (0.25 mg total) by mouth daily as needed for anxiety. If not needed, do not take. 09/25/13  Yes Hilton Sinclair, MD  atorvastatin (LIPITOR) 40 MG tablet Take 1 tablet (40 mg total) by mouth every evening. 01/25/14  Yes Hilton Sinclair, MD  chlorhexidine (HIBICLENS) 4 % external liquid Apply topically daily as needed. 12/11/13  Yes Frazier Richards, MD  etonogestrel (IMPLANON) 68 MG IMPL implant Inject 1 each into the skin once. 02/13/11  Yes Historical Provider, MD  fluocinonide ointment (LIDEX) 1.88 % Apply 1 application topically 2 (two) times daily. To the scalp. For 7 days. 12/22/13  Yes Leone Haven, MD  losartan-hydrochlorothiazide (HYZAAR) 100-25 MG per tablet Take 1 tablet by mouth daily. 12/09/13  Yes Hilton Sinclair, MD  lubiprostone (AMITIZA) 8 MCG capsule Take 8 mcg by mouth 2 (two) times daily with a meal.   Yes Historical Provider, MD  metroNIDAZOLE (FLAGYL) 500 MG tablet Take 1 tablet (500 mg total) by mouth 2 (two) times daily. For 7 days. Do not drink alcohol while taking this medicine. 02/12/14  Yes Leeanne Rio, MD  Olopatadine HCl 0.2 % SOLN Apply 1 drop to eye daily. 02/02/14  Yes Hilton Sinclair, MD  traMADol (ULTRAM) 50 MG tablet Take 1 tablet (50 mg total) by mouth every 8 (eight) hours as needed for pain. 08/21/12  Yes Andrena Mews, MD  traMADol (ULTRAM) 50 MG tablet Take 1 tablet (50 mg total) by mouth every 12 (twelve) hours as needed for severe pain. 02/01/14  Yes Everlene Balls, MD  albuterol (PROVENTIL HFA;VENTOLIN HFA) 108 (90  BASE) MCG/ACT inhaler Inhale 2 puffs into the lungs every 6 (six) hours as needed for wheezing.     Historical Provider, MD  cephALEXin (KEFLEX) 500 MG capsule Take 1 capsule (500 mg total) by mouth 3 (three) times daily. 12/11/13   Frazier Richards, MD  diazepam (VALIUM) 2 MG tablet Take 1 tablet (2 mg total) by mouth every 12 (twelve) hours as needed for muscle spasms (spasms). 02/01/14   Everlene Balls, MD  fluconazole (DIFLUCAN) 150 MG tablet Take 1 tablet (150 mg total) by mouth daily. 02/21/14   Melony Overly, MD  fluticasone (FLONASE) 50 MCG/ACT nasal spray Place 2 sprays into the nose daily. 07/04/12   Carolin Guernsey, MD  hydrocortisone 2.5 % cream Apply topically 2 (two) times daily. 02/21/14   Melony Overly, MD  loratadine (CLARITIN) 10 MG tablet Take 1 tablet (10 mg total) by mouth daily. 09/25/13   Hilton Sinclair, MD  nystatin cream (MYCOSTATIN) Apply to affected area 2 times daily 02/21/14   Melony Overly, MD  pantoprazole (PROTONIX) 40 MG tablet Take 1 tablet (40 mg total) by mouth daily. 06/09/12   Carolin Guernsey, MD  SUMAtriptan (IMITREX) 50 MG tablet Take 50 mg by mouth every 2 (two) hours as needed for migraine.     Historical Provider, MD   BP 160/107 mmHg  Pulse 103  Temp(Src) 99.2 F (37.3 C) (Oral)  Resp 16  SpO2 99% Physical Exam  Constitutional: She is oriented to person, place, and time. She appears well-developed and well-nourished. No distress.  Cardiovascular: Normal rate.   Pulmonary/Chest: Effort normal.  Genitourinary:  Erythematous macerated skin in the vulvar region; satellite lesions present.  Neurological: She is alert and oriented to person, place, and time.    ED Course  Procedures (including critical care time) Labs Review Labs Reviewed - No data to display  Imaging Review US Transvaginal Non-ob  02/19/2014   CLINICAL DATA:  Nodule of the vagina. History of fibroids. C-section. Prior hypertension. Amenorrhea secondary to Depo-Provera. Premenopausal.   EXAM: TRANSABDOMINAL AND TRANSVAGINAL ULTRASOUND OF PELVIS  TECHNIQUE: Both transabdominal and transvaginal ultrasound examinations of the pelvis were performed. Transabdominal technique was performed for global imaging of the pelvis including uterus, ovaries, adnexal regions, and pelvic cul-de-sac. It was necessary to proceed with endovaginal exam following the transabdominal exam to visualize the uterus and ovaries.  COMPARISON:  None  FINDINGS: Uterus  Measurements: 8.5 x 3.6 x 5.8 cm. Small fibroids are present. In the fundal region fibroid is 1.0 x 0.9 x 0.7 cm. In the central uterus a probable submucosal fibroid is 1.0  x 0.8 x 0.9 cm.  Endometrium  Thickness: 2.2 mm.  No focal abnormality visualized.  Right ovary  Measurements: 3.2 x 2.5 x 2.2 cm. A cystic lesion is 2.5 x 2.3 x 1.3 cm. This is anechoic centrally but does contain a mural nodule demonstrating blood flow on Doppler evaluation.  Left ovary  Measurements: 2.3 x 1.3 x 1.5 cm. Normal appearance/no adnexal mass.  Other findings  A 1.1 x 0.7 x 1.1 cm hypoechoic solid nodule is identified along the anterior vaginal wall on endovaginal images and may represent the palpable abnormality. This is not hypervascular on Doppler evaluation.  IMPRESSION: 1. Small uterine fibroids. 1.0 cm fibroid is probably submucosal in location. 2. 2.5 cm right ovarian cyst. This contains a vascular mural nodule and followup is recommended. Follow-up pelvic ultrasound is suggested in 8-12 weeks. 3. 1.1 cm nodule noted at the vaginal apex possibly representing the palpable abnormality. This appears solid but not hypervascular.   Electronically Signed   By: Shon Hale M.D.   On: 02/19/2014 14:57   US Pelvis Complete  02/19/2014   CLINICAL DATA:  Nodule of the vagina. History of fibroids. C-section. Prior hypertension. Amenorrhea secondary to Depo-Provera. Premenopausal.  EXAM: TRANSABDOMINAL AND TRANSVAGINAL ULTRASOUND OF PELVIS  TECHNIQUE: Both transabdominal and  transvaginal ultrasound examinations of the pelvis were performed. Transabdominal technique was performed for global imaging of the pelvis including uterus, ovaries, adnexal regions, and pelvic cul-de-sac. It was necessary to proceed with endovaginal exam following the transabdominal exam to visualize the uterus and ovaries.  COMPARISON:  None  FINDINGS: Uterus  Measurements: 8.5 x 3.6 x 5.8 cm. Small fibroids are present. In the fundal region fibroid is 1.0 x 0.9 x 0.7 cm. In the central uterus a probable submucosal fibroid is 1.0 x 0.8 x 0.9 cm.  Endometrium  Thickness: 2.2 mm.  No focal abnormality visualized.  Right ovary  Measurements: 3.2 x 2.5 x 2.2 cm. A cystic lesion is 2.5 x 2.3 x 1.3 cm. This is anechoic centrally but does contain a mural nodule demonstrating blood flow on Doppler evaluation.  Left ovary  Measurements: 2.3 x 1.3 x 1.5 cm. Normal appearance/no adnexal mass.  Other findings  A 1.1 x 0.7 x 1.1 cm hypoechoic solid nodule is identified along the anterior vaginal wall on endovaginal images and may represent the palpable abnormality. This is not hypervascular on Doppler evaluation.  IMPRESSION: 1. Small uterine fibroids. 1.0 cm fibroid is probably submucosal in location. 2. 2.5 cm right ovarian cyst. This contains a vascular mural nodule and followup is recommended. Follow-up pelvic ultrasound is suggested in 8-12 weeks. 3. 1.1 cm nodule noted at the vaginal apex possibly representing the palpable abnormality. This appears solid but not hypervascular.   Electronically Signed   By: Shon Hale M.D.   On: 02/19/2014 14:57     MDM   1. Candidal vulvovaginitis    She has a moderate to severe case of candidal vulvovaginitis. The clotrimazole cream she was previously prescribed does not appear to be effective. We'll treat aggressively with Diflucan 150 mg by mouth daily for 2 weeks as well as nystatin cream twice daily for 2 weeks. Also provided hydrocortisone 2.5% cream to use over the  next few days for symptom relief. Discussed importance of keeping the area dry. Recommended using maxi pads and Goldbond powder. Also discussed avoiding antibacterial soaps. Follow-up with PCP later this week for recheck.    Melony Overly, MD 02/21/14 [id number]

## 2014-02-21 NOTE — ED Notes (Signed)
C/o vaginal itching, burning and drainage on outer labia and inner thighs.  Saw [facility] doctor on Monday and was Flagyl and Diflucan.  Also given a cream and powder for rash.  It is moving closer to her vagina but has healed up in some places.

## 2014-02-21 NOTE — Discharge Instructions (Signed)
You have a bad yeast infection in the skin. Take diflucan 1 pill daily for 2 weeks. Apply Nystatin cream twice a day for the next 2 weeks. Use Hydrocortisone cream twice a day for the next 3 days until the other medicines start to clear the infection. Put Gold Bond powder on the area after the creams to help keep the area dry. Wear white cotton underwear.   Use a maxi pad - no scents or dyes! Wash with a mild unscented soap like Dove.  Do NOT use antibacterial soap.  Follow up with your regular doctor in 1 week.

## 2014-02-21 NOTE — ED Notes (Signed)
Dr. went to room to talk to pt. and she was not there.  Janeece Riggers said she saw her leaving.  Pt. called to come back of d/c instructions.

## 2014-02-26 ENCOUNTER — Telehealth: Payer: Self-pay | Admitting: Family Medicine

## 2014-02-26 DIAGNOSIS — N83201 Unspecified ovarian cyst, right side: Secondary | ICD-10-CM

## 2014-02-26 DIAGNOSIS — N899 Noninflammatory disorder of vagina, unspecified: Secondary | ICD-10-CM

## 2014-02-26 MED ORDER — NYSTATIN 100000 UNIT/GM EX CREA: TOPICAL_CREAM | CUTANEOUS | Status: DC

## 2014-02-26 NOTE — Telephone Encounter (Signed)
Called pt to discuss u/s and pap results. U/s showed small fibroids, 2.5 R ovarian cyst (needing f/u in 8-12 weeks) and a 1.1cm solid nodule at vaginal apex. Pap was normal except transformation zone absent. Explained that typically we would just continue with regular screening.  Pt reports still having some vaginal discomfort - was seen at Urgent Care and given rx for daily diflucan x 2 weeks as well as nystatin cream. Has run out of nystatin cream (having to use a lot due to the large area affected) and requests refill of this.  Plan: 1. Will order f/u ultrasound to be scheduled in around 10 weeks. Red team, can you schedule this and call pt with appointment? 2. Refer to GYN for vaginal nodule evaluation. 3. Pt would like to talk about getting repeat pap due to absent transformation zone - advised she can discuss this with GYN when she sees them. 4. Will refill nystatin cream for pt. Explained if still not getting better with this refill, needs to return for re-evaluation.  Leeanne Rio, MD

## 2014-03-01 ENCOUNTER — Telehealth: Payer: Self-pay

## 2014-03-01 ENCOUNTER — Telehealth: Payer: Self-pay | Admitting: Family Medicine

## 2014-03-01 NOTE — Telephone Encounter (Signed)
Pt is calling re: her obgyn referral, the one we set up for her is not able to get her in until January, pt wants to know if we can refer her to [facility], they can get her in sooner.

## 2014-03-01 NOTE — Telephone Encounter (Signed)
CALLED AND LEFT MESSAGE REGARDING APPT ON 05/06/13 WITH DR HARPER AT 2:30PM - NEED TO VERIFY INSURANCE AS WELL

## 2014-03-02 NOTE — Telephone Encounter (Signed)
Spoke with patient and informed of new appt with [facility] tomorrow 03/03/14.  She is aware of this and will call and cancel her appt at [place]. Jazmin Hartsell,CMA

## 2014-03-10 ENCOUNTER — Ambulatory Visit: Payer: No Typology Code available for payment source | Admitting: Family Medicine

## 2014-03-15 ENCOUNTER — Encounter: Payer: Self-pay | Admitting: Family Medicine

## 2014-03-15 ENCOUNTER — Ambulatory Visit (INDEPENDENT_AMBULATORY_CARE_PROVIDER_SITE_OTHER): Payer: No Typology Code available for payment source | Admitting: Family Medicine

## 2014-03-15 VITALS — BP 155/93 | HR 82 | Temp 99.0°F | Wt 185.0 lb

## 2014-03-15 DIAGNOSIS — J0140 Acute pansinusitis, unspecified: Secondary | ICD-10-CM

## 2014-03-15 DIAGNOSIS — H00016 Hordeolum externum left eye, unspecified eyelid: Secondary | ICD-10-CM

## 2014-03-15 DIAGNOSIS — H02846 Edema of left eye, unspecified eyelid: Secondary | ICD-10-CM

## 2014-03-15 MED ORDER — SULFAMETHOXAZOLE-TRIMETHOPRIM 800-160 MG PO TABS: 1.0000 | ORAL_TABLET | Freq: Two times a day (BID) | ORAL | Status: DC

## 2014-03-15 MED ORDER — OLOPATADINE HCL 0.2 % OP SOLN: 1.0000 [drp] | Freq: Every day | OPHTHALMIC | Status: DC

## 2014-03-15 MED ORDER — NEOMYCIN-POLYMYXIN-DEXAMETH 0.1 % OP SUSP: 1.0000 [drp] | Freq: Four times a day (QID) | OPHTHALMIC | Status: DC | PRN

## 2014-03-15 NOTE — Progress Notes (Signed)
   Subjective:    Patient ID: Bailey Hooper, female    DOB: Oct 08, 1965, 48 y.o.   MRN: [id number]  HPI: Pt presents to [facility] visit for left eye redness and swelling for 2 days. She was moving this past Saturday (today is Monday), around "a lot of dusty stuff," and states she had itching, redness, and swelling of her left upper eyelid that has slowly been worsening. She has some discomfort in her upper inner corner of her left eye with moving her eye up and inward, but she has no frank pain that keeps her from moving her eye. She denies frank foreign body sensation. She has started having fevers and congestion in the past few days, with some sinus-type headache last week which has resolved. When she blows her nose, mucous from the left nostril has some streaky dried blood in it. She has no new medicines or other new exposures; she has been using Benadryl PO and Patanol eye drops, with no relief. She does have a co-worker who has had bronchitis but otherwise has no sick contacts. She has no right-sided eye complaints. She states she has had pink-eye in the past but this feels very different.  Review of Systems: As above.     Objective:   Physical Exam BP 155/93 mmHg  Pulse 82  Temp(Src) 99 F (37.2 C) (Oral)  Wt 185 lb (83.915 kg) Gen: well-appearing but uncomfortable adult female in NAD HEENT: [place]/AT, EOMI bilaterally, TM's clear bilaterally, MMM  Left upper eyelid grossly swollen along entire length with small nodular mass near lid edge, medially  Upper eyelid nontender throughout length with minimal tenderness over mass itself  Opening of gland duct visible at mass without frank drainage or expressible material  No surrounding facial or periorbital swelling, redness, induration  No pain with eye movements  No obvious discharge / drainage  Right eye normal by comparison  Bilateral sinus tenderness especially over left maxilla  Nasal mucosae very red / inflammed Neck: supple, full ROM, mild  shotty lymphadenopathy Cardio: RRR, no murmur appreciated Pulm: CTAB, no wheezes     Assessment & Plan:  48yo female with left hordeolum with significant eye swelling as well as likely sinusitis - Rx for Bactrim to cover for sinusitis as well as possible infected hordeolum - advised strict hand hygiene, warm compresses, etc for supportive care of hordeolum - continue antihistamines, eye drops OTC PRN for symptoms - Rx written for Maxitrol eye drops and re-wrote for olopatadine drops as pt states this was ordered last month and never sent to the pharmacy correctly - pt already has f/u with PCP Dr. Dianah Field in a few days --> to be re-evaluated at that time - reviewed red flags to re-present to clinic earlier (worsening symptoms, systemic symptoms, eye pain especially with movements, etc)  Note FYI to Dr. Samuel Bouche, MD PGY-3, [facility] 03/15/2014, 9:50 PM

## 2014-03-15 NOTE — Patient Instructions (Addendum)
Thank you for coming in, today!  I think you do have an infection of the eyelid and maybe the beginning of a sinus infection. I want you to use warm compresses on your eye for 10-20 minutes at a time, several times through the day. You should also take an antibiotic called Bactrim (sulfamethoxazole-trimethoprim, SMX-TMP) for 10 days. You can continue to use eye drops, Benadryl or other allergy medicines, and / or saline flushes as needed for the itching. I will give you a prescription for eye drops with antibiotic and anti-inflammatory medicine, if the antibiotic by mouth doesn't help.  If your symptoms do not get better over the next several days, call or come back to see Dr. Darene Lamer. I will send her a message in the computer so she knows what we did.  Please feel free to call with any questions or concerns at any time, at [phone]. --Dr. Merrilyn Puma A sty (hordeolum) is an infection of a gland in the eyelid located at the base of the eyelash. A sty may develop a white or yellow head of pus. It can be puffy (swollen). Usually, the sty will burst and pus will come out on its own. They do not leave lumps in the eyelid once they drain. A sty is often confused with another form of cyst of the eyelid called a chalazion. Chalazions occur within the eyelid and not on the edge where the bases of the eyelashes are. They often are red, sore and then form firm lumps in the eyelid.  HOME CARE INSTRUCTIONS   Wash your hands often and dry them with a clean towel. Avoid touching your eyelid. This may spread the infection to other parts of the eye.  Apply heat to your eyelid for 10 to 20 minutes, several times a day, to ease pain and help to heal it faster.  Do not squeeze the sty. Allow it to drain on its own. Wash your eyelid carefully 3 to 4 times per day to remove any pus.

## 2014-03-18 ENCOUNTER — Ambulatory Visit (INDEPENDENT_AMBULATORY_CARE_PROVIDER_SITE_OTHER): Payer: No Typology Code available for payment source | Admitting: Family Medicine

## 2014-03-18 ENCOUNTER — Encounter: Payer: Self-pay | Admitting: Family Medicine

## 2014-03-18 VITALS — BP 130/90 | HR 75 | Temp 98.6°F | Ht 66.0 in | Wt 180.4 lb

## 2014-03-18 DIAGNOSIS — Z716 Tobacco abuse counseling: Secondary | ICD-10-CM

## 2014-03-18 DIAGNOSIS — N899 Noninflammatory disorder of vagina, unspecified: Secondary | ICD-10-CM

## 2014-03-18 DIAGNOSIS — R7303 Prediabetes: Secondary | ICD-10-CM

## 2014-03-18 DIAGNOSIS — J3089 Other allergic rhinitis: Secondary | ICD-10-CM

## 2014-03-18 DIAGNOSIS — I10 Essential (primary) hypertension: Secondary | ICD-10-CM

## 2014-03-18 DIAGNOSIS — R7309 Other abnormal glucose: Secondary | ICD-10-CM

## 2014-03-18 LAB — POCT GLYCOSYLATED HEMOGLOBIN (HGB A1C): Hemoglobin A1C: 5.7

## 2014-03-18 MED ORDER — LORATADINE 10 MG PO TABS: 10.0000 mg | ORAL_TABLET | Freq: Every day | ORAL | Status: DC

## 2014-03-18 MED ORDER — FLUTICASONE PROPIONATE 50 MCG/ACT NA SUSP: 2.0000 | Freq: Every day | NASAL | Status: DC

## 2014-03-18 NOTE — Assessment & Plan Note (Signed)
Pt seen at [facility] but records were not there. - ROI for clinic notes/results to be sent to [facility].

## 2014-03-18 NOTE — Patient Instructions (Signed)
Great to see you.  For your blood pressure, it was normal in clinic today. Try 1/2 tablet daily of your BP medication for 1-2 weeks and write down BP values when you are seated and calm.  Call about these in 2 weeks and follow up with me to discuss these in 1 month. If you have chest pain or trouble breathing, seek sooner care. Watch salt. I am printing the Gravity for hypertension. Quitting smoking also helps with high BP. The QUIT line is 1-800-QUIT-NOW. Dr Valentina Lucks is the pharmacist here who helps with quitting if you want to make an appointment with him. Cut back to 1 bag of chips daily, and slowly to your goal of 2 bags weekly. YOU CAN DO IT.  For what sounds like allergies, use loratadine, flonase, and nasal saline daily. Follow up if not improving in 2 weeks or you have worsening symptoms.   DASH Eating Plan DASH stands for "Dietary Approaches to Stop Hypertension." The DASH eating plan is a healthy eating plan that has been shown to reduce high blood pressure (hypertension). Additional health benefits may include reducing the risk of type 2 diabetes mellitus, heart disease, and stroke. The DASH eating plan may also help with weight loss. WHAT DO I NEED TO KNOW ABOUT THE DASH EATING PLAN? For the DASH eating plan, you will follow these general guidelines:  Choose foods with a percent daily value for sodium of less than 5% (as listed on the food label).  Use salt-free seasonings or herbs instead of table salt or sea salt.  Check with your health care provider or pharmacist before using salt substitutes.  Eat lower-sodium products, often labeled as "lower sodium" or "no salt added."  Eat fresh foods.  Eat more vegetables, fruits, and low-fat dairy products.  Choose whole grains. Look for the word "whole" as the first word in the ingredient list.  Choose fish and skinless chicken or [place] more often than red meat. Limit fish, poultry, and meat to 6 oz (170 g) each day.  Limit  sweets, desserts, sugars, and sugary drinks.  Choose heart-healthy fats.  Limit cheese to 1 oz (28 g) per day.  Eat more home-cooked food and less restaurant, buffet, and fast food.  Limit fried foods.  Cook foods using methods other than frying.  Limit canned vegetables. If you do use them, rinse them well to decrease the sodium.  When eating at a restaurant, ask that your food be prepared with less salt, or no salt if possible. WHAT FOODS CAN I EAT? Seek help from a dietitian for individual calorie needs. Grains Whole grain or whole wheat bread. Brown rice. Whole grain or whole wheat pasta. Quinoa, bulgur, and whole grain cereals. Low-sodium cereals. Corn or whole wheat flour tortillas. Whole grain cornbread. Whole grain crackers. Low-sodium crackers. Vegetables Fresh or frozen vegetables (raw, steamed, roasted, or grilled). Low-sodium or reduced-sodium tomato and vegetable juices. Low-sodium or reduced-sodium tomato sauce and paste. Low-sodium or reduced-sodium canned vegetables.  Fruits All fresh, canned (in natural juice), or frozen fruits. Meat and Other Protein Products Ground beef (85% or leaner), grass-fed beef, or beef trimmed of fat. Skinless chicken or [place]. Ground chicken or [place]. Pork trimmed of fat. All fish and seafood. Eggs. Dried beans, peas, or lentils. Unsalted nuts and seeds. Unsalted canned beans. Dairy Low-fat dairy products, such as skim or 1% milk, 2% or reduced-fat cheeses, low-fat ricotta or cottage cheese, or plain low-fat yogurt. Low-sodium or reduced-sodium cheeses. Fats and Oils Tub  margarines without trans fats. Light or reduced-fat mayonnaise and salad dressings (reduced sodium). Avocado. Safflower, olive, or canola oils. Natural peanut or almond butter. Other Unsalted popcorn and pretzels. The items listed above may not be a complete list of recommended foods or beverages. Contact your dietitian for more options. WHAT FOODS ARE NOT  RECOMMENDED? Grains White bread. White pasta. White rice. Refined cornbread. Bagels and croissants. Crackers that contain trans fat. Vegetables Creamed or fried vegetables. Vegetables in a cheese sauce. Regular canned vegetables. Regular canned tomato sauce and paste. Regular tomato and vegetable juices. Fruits Dried fruits. Canned fruit in light or heavy syrup. Fruit juice. Meat and Other Protein Products Fatty cuts of meat. Ribs, chicken wings, bacon, sausage, bologna, salami, chitterlings, fatback, hot dogs, bratwurst, and packaged luncheon meats. Salted nuts and seeds. Canned beans with salt. Dairy Whole or 2% milk, cream, half-and-half, and cream cheese. Whole-fat or sweetened yogurt. Full-fat cheeses or blue cheese. Nondairy creamers and whipped toppings. Processed cheese, cheese spreads, or cheese curds. Condiments Onion and garlic salt, seasoned salt, table salt, and sea salt. Canned and packaged gravies. Worcestershire sauce. Tartar sauce. Barbecue sauce. Teriyaki sauce. Soy sauce, including reduced sodium. Steak sauce. Fish sauce. Oyster sauce. Cocktail sauce. Horseradish. Ketchup and mustard. Meat flavorings and tenderizers. Bouillon cubes. Hot sauce. Tabasco sauce. Marinades. Taco seasonings. Relishes. Fats and Oils Butter, stick margarine, lard, shortening, ghee, and bacon fat. Coconut, palm kernel, or palm oils. Regular salad dressings. Other Pickles and olives. Salted popcorn and pretzels. The items listed above may not be a complete list of foods and beverages to avoid. Contact your dietitian for more information. WHERE CAN I FIND MORE INFORMATION? National Heart, Lung, and Blood Institute: travelstabloid.com Document Released: 03/15/2011 Document Revised: 08/10/2013 Document Reviewed: 01/28/2013 [facility] Patient Information 2015 [place], [place]. This information is not intended to replace advice given to you by your health care provider. Make  sure you discuss any questions you have with your health care provider.

## 2014-03-18 NOTE — Assessment & Plan Note (Signed)
Eye itching with no vision loss, ophthalmoplegia, or erythema of eye. Eyelids seem dry and irritated. Likely related to allergies. - Restart loratadine and flonase. - Use nasal saline. - return if fevers/chills, worsening, or no improvement.

## 2014-03-18 NOTE — Progress Notes (Signed)
Patient ID: Bailey Hooper, female   DOB: [birth year], 48 y.o.   MRN: [id number] Subjective:   CC: Follow up HTN, tobacco abuse, eye itching  HPI:   F/u Hypertension Pt was taking losartan-HCTZ 100-25mg  daily, but feels like the BP medication does not help and may actually increase her BP. She has not taken it in 2 days and BP was 135/80. She was taking regularly prior to that and BPs were 150-160s/90-100s. She denies dizziness, fainting, dyspnea, chest pain, leg swelling, blurred vision, or other concerns.  Diet: Pays attention to salt, does not add if not needed, seasons lightly, but endorses weakness for potato chips. Has 2 small bags daily. Goal is 2 weekly. Gave up soft drinks. Smokes 3 cig/day.   Tobacco abuse Patient realizes need to quit; has brought cigarettes down to 3 cigarettes daily.  Importance to her is 10 on 10 point scale because of worry about the smell, her teeth, her health, and her grandchildren. Confidence is 7/10 because she was able to quit before but has always started back.  Eyes itching Still itchy, watery, and red despite using antibiotic eye drop and prednisone. No fevers or chills.   Review of Systems - Per HPI. Additionally, she has a bump behind her ear. Vaginal itching resolved. She has seen [facility] but our records were not sent to them.  PMH - vit D deficiency, tobacco abuse, seborrheic dermatitis scalp, right hip pain, murmur, migraine, IBS, GERD, herpes, HTN, Depression, right breast cyst, recurrent boils, asthma, anxiety, back pain, allergic rhinitis Smoking status: 3 cig daily    Objective:  Physical Exam BP 130/90 mmHg  Pulse 75  Temp(Src) 98.6 F (37 C) (Oral)  Ht 5\' 6"  (1.676 m)  Wt 180 lb 6.4 oz (81.829 kg)  BMI 29.13 kg/m2 GEN: NAD CV: RRR, no m/r/g, 2+ b radial pulses PULM: CTAB, normal effort EXTR: NO LE edema or calf tenderness HEENT: Eyes mildly dry around, nares patent but mildly erythematous/deep pink, rhinorrhea     Assessment:     Bailey Hooper is a 48 y.o. female here for f/u HTN and tobacco abuse.    Plan:     # See problem list and after visit summary for problem-specific plans.   # Health Maintenance: Declined flu  - Return for well woman when convenient - Obesity: Checking A1c. Discuss at f/u.  Follow-up: Follow up in 2 weeks PRN lack of improvement of eye itching/congestion.   Hilton Sinclair, MD [place]

## 2014-03-18 NOTE — Assessment & Plan Note (Signed)
Discussed cessation. Pt is motivated and confidence is 7/10.  - counseled, congratulated on bringing down to 3 cigarettes daily, and gave help line.

## 2014-03-18 NOTE — Assessment & Plan Note (Signed)
BP well controlled in clinic and 1 value at home off medication, but when on reports elevated BPs. Wonder if anxiety re: medication plays a role. Pt wants to trial on lower dose medication. Normal renal function 09/2013. - 1/2 tab of medication daily. Check BPs at home for 2 weeks when calm and seated, and call me with these. - F/u in 1 month. - work on smoking cessation and cutting back on salt (goal: 1 small bag chips daily, slowly down to 2 bags weekly). DASH diet provided. - return precautions reviewed.

## 2014-05-06 ENCOUNTER — Ambulatory Visit: Payer: Self-pay | Admitting: Obstetrics

## 2014-05-23 ENCOUNTER — Other Ambulatory Visit: Payer: Self-pay | Admitting: Family Medicine

## 2014-05-25 NOTE — Telephone Encounter (Signed)
Pt is aware of this. Mohmed Farver,CMA  

## 2014-05-25 NOTE — Telephone Encounter (Signed)
Refilling nystatin cream. Please call patient and let her know that we need her to come in for follow up since this vaginal itching has not improved. Hilton Sinclair, MD

## 2014-07-07 ENCOUNTER — Encounter: Payer: Self-pay | Admitting: Family Medicine

## 2014-07-07 ENCOUNTER — Ambulatory Visit (INDEPENDENT_AMBULATORY_CARE_PROVIDER_SITE_OTHER): Payer: 59 | Admitting: Family Medicine

## 2014-07-07 VITALS — BP 169/100 | HR 71 | Temp 98.2°F | Ht 66.0 in | Wt 182.4 lb

## 2014-07-07 DIAGNOSIS — N912 Amenorrhea, unspecified: Secondary | ICD-10-CM | POA: Diagnosis not present

## 2014-07-07 DIAGNOSIS — Z3040 Encounter for surveillance of contraceptives, unspecified: Secondary | ICD-10-CM

## 2014-07-07 DIAGNOSIS — Z308 Encounter for other contraceptive management: Secondary | ICD-10-CM

## 2014-07-07 DIAGNOSIS — J309 Allergic rhinitis, unspecified: Secondary | ICD-10-CM | POA: Diagnosis not present

## 2014-07-07 DIAGNOSIS — Z309 Encounter for contraceptive management, unspecified: Secondary | ICD-10-CM | POA: Insufficient documentation

## 2014-07-07 DIAGNOSIS — I10 Essential (primary) hypertension: Secondary | ICD-10-CM

## 2014-07-07 DIAGNOSIS — Z30019 Encounter for initial prescription of contraceptives, unspecified: Secondary | ICD-10-CM | POA: Diagnosis not present

## 2014-07-07 LAB — POCT URINE PREGNANCY: Preg Test, Ur: NEGATIVE

## 2014-07-07 MED ORDER — LORATADINE 10 MG PO TABS: 10.0000 mg | ORAL_TABLET | Freq: Every day | ORAL | Status: DC

## 2014-07-07 MED ORDER — ETONOGESTREL 68 MG ~~LOC~~ IMPL
68.0000 mg | DRUG_IMPLANT | Freq: Once | SUBCUTANEOUS | Status: AC
  Administered 2014-07-07: 68 mg via SUBCUTANEOUS

## 2014-07-07 MED ORDER — OLOPATADINE HCL 0.2 % OP SOLN: 1.0000 [drp] | Freq: Every day | OPHTHALMIC | Status: DC

## 2014-07-07 MED ORDER — FLUTICASONE PROPIONATE 50 MCG/ACT NA SUSP: 2.0000 | Freq: Every day | NASAL | Status: DC

## 2014-07-07 MED ORDER — AZELASTINE HCL 0.05 % OP SOLN: 1.0000 [drp] | Freq: Two times a day (BID) | OPHTHALMIC | Status: DC

## 2014-07-07 MED ORDER — OLOPATADINE HCL 0.7 % OP SOLN: 1.0000 [drp] | Freq: Every day | OPHTHALMIC | Status: DC

## 2014-07-07 NOTE — Assessment & Plan Note (Signed)
Patient's blood pressure was very elevated today, but she reports having a stressful busy day and forgetting her blood pressure medication this morning. Does not report any chest pain or dyspnea. Other vital signs normal. -Discussed importance of taking medication daily. -Asked patient to check blood pressure at home this week and call me with numbers so we can determine follow-up for this.

## 2014-07-07 NOTE — Assessment & Plan Note (Signed)
Allergies mildly flared due to season. -Allergy medications refilled per patient request -Got notice from online pharmacy that pataday/olopatadine not covered but Azelastine and lastacaft are. Ordered azelastine eye drops.

## 2014-07-07 NOTE — Patient Instructions (Signed)
You had your nexplanon removed and a new one inserted today. Be sure to get this removed in 3 years from today. You can reduce the small risk of clot or stroke by quitting smoking. Please call me with blood pressure values this week after taking medication. If you have any signs of infection that we discussed, seek immediate care. Best. Hilton Sinclair, MD  Etonogestrel implant What is this medicine? ETONOGESTREL (et oh noe JES trel) is a contraceptive (birth control) device. It is used to prevent pregnancy. It can be used for up to 3 years. This medicine may be used for other purposes; ask your health care provider or pharmacist if you have questions. COMMON BRAND NAME(S): Implanon, Nexplanon What should I tell my health care provider before I take this medicine? They need to know if you have any of these conditions: -abnormal vaginal bleeding -blood vessel disease or blood clots -cancer of the breast, cervix, or liver -depression -diabetes -gallbladder disease -headaches -heart disease or recent heart attack -high blood pressure -high cholesterol -kidney disease -liver disease -renal disease -seizures -tobacco smoker -an unusual or allergic reaction to etonogestrel, other hormones, anesthetics or antiseptics, medicines, foods, dyes, or preservatives -pregnant or trying to get pregnant -breast-feeding How should I use this medicine? This device is inserted just under the skin on the inner side of your upper arm by a health care professional. Talk to your pediatrician regarding the use of this medicine in children. Special care may be needed. Overdosage: If you think you've taken too much of this medicine contact a poison control center or emergency room at once. Overdosage: If you think you have taken too much of this medicine contact a poison control center or emergency room at once. NOTE: This medicine is only for you. Do not share this medicine with others. What if I  miss a dose? This does not apply. What may interact with this medicine? Do not take this medicine with any of the following medications: -amprenavir -bosentan -fosamprenavir This medicine may also interact with the following medications: -barbiturate medicines for inducing sleep or treating seizures -certain medicines for fungal infections like ketoconazole and itraconazole -griseofulvin -medicines to treat seizures like carbamazepine, felbamate, oxcarbazepine, phenytoin, topiramate -modafinil -phenylbutazone -rifampin -some medicines to treat HIV infection like atazanavir, indinavir, lopinavir, nelfinavir, tipranavir, ritonavir -St. John's wort This list may not describe all possible interactions. Give your health care provider a list of all the medicines, herbs, non-prescription drugs, or dietary supplements you use. Also tell them if you smoke, drink alcohol, or use illegal drugs. Some items may interact with your medicine. What should I watch for while using this medicine? This product does not protect you against HIV infection (AIDS) or other sexually transmitted diseases. You should be able to feel the implant by pressing your fingertips over the skin where it was inserted. Tell your doctor if you cannot feel the implant. What side effects may I notice from receiving this medicine? Side effects that you should report to your doctor or health care professional as soon as possible: -allergic reactions like skin rash, itching or hives, swelling of the face, lips, or tongue -breast lumps -changes in vision -confusion, trouble speaking or understanding -dark urine -depressed mood -general ill feeling or flu-like symptoms -light-colored stools -loss of appetite, nausea -right upper belly pain -severe headaches -severe pain, swelling, or tenderness in the abdomen -shortness of breath, chest pain, swelling in a leg -signs of pregnancy -sudden numbness or weakness of the face, arm  or leg -trouble walking, dizziness, loss of balance or coordination -unusual vaginal bleeding, discharge -unusually weak or tired -yellowing of the eyes or skin Side effects that usually do not require medical attention (Report these to your doctor or health care professional if they continue or are bothersome.): -acne -breast pain -changes in weight -cough -fever or chills -headache -irregular menstrual bleeding -itching, burning, and vaginal discharge -pain or difficulty passing urine -sore throat This list may not describe all possible side effects. Call your doctor for medical advice about side effects. You may report side effects to FDA at 1-800-FDA-1088. Where should I keep my medicine? This drug is given in a hospital or clinic and will not be stored at home. NOTE: This sheet is a summary. It may not cover all possible information. If you have questions about this medicine, talk to your doctor, pharmacist, or health care provider.  2015, Elsevier/Gold Standard. (2011-10-01 15:37:45)

## 2014-07-07 NOTE — Assessment & Plan Note (Signed)
We discussed the slight increased risk of stroke or clot with hormonal birth control methods and migraine with aura. The patient would like to continue Nexplanon as it has worked so well for her for years. -Patient would like to try to quit smoking. Should make appt with [place] if needs any support. -Nexplanon removed and reinserted per procedure note. Patient informed to have removed in 3 years. -Signs of VTE or stroke, though unlikely, were reviewed as were signs of infection at insertion site.

## 2014-07-07 NOTE — Progress Notes (Signed)
Patient ID: Bailey Hooper, female   DOB: April 28, 1965, 49 y.o.   MRN: [id number] Subjective:   CC: Nexplanon removal/reinsertion  HPI:   Nexplanon removal/insertion Patient presents for Nexplanon removal and reinsertion. She is overdue for removal. She stopped having periods with Nexplanon but still has not had a period since it expired a few months ago. She does not report any nausea, vomiting, abdominal bloating, or other concerns. She never had any issues with the Nexplanon and really likes it as a birth control method. She does smoke cigarettes but is trying to cut back. She has had migraines with aura but not in a few months. She uses Imitrex for this. She denies any history of DVT or stroke or family history of either.  Hypertension Patient's blood pressure was elevated this morning because she had a very busy day and has been running around doing chores She did not take her medication today yet. She does not report any chest pain or trouble breathing.  Review of Systems - Per HPI.  PMH: Allergic rhinitis, anxiety, asthma, recurrent boils, depression, hypertension, genital herpes, GERD, hiatal hernia, IBS, migraines, right hip pain, tobacco abuse, vitamin D deficiency Smoking status: Current every day smoker, trying to cut back    Objective:  Physical Exam BP 169/100 mmHg  Pulse 71  Temp(Src) 98.2 F (36.8 C) (Oral)  Ht 5\' 6"  (1.676 m)  Wt 182 lb 7 oz (82.753 kg)  BMI 29.46 kg/m2 GEN: NAD Extremities: Left medial upper arm with nexplanon palpated under the skin, well-healed small 3 mm scar 1 cm above, no erythema, induration, or warmth Cardiovascular: Regular rate and rhythm  PROCEDURE NOTE: [place] Patient given informed consent and signed copy in the chart, timeout performed. Pregnancy test was NEGATIVE.  Left arm area prepped and draped in the usual sterile fashion. Three cc of lidocaine without epinephrine 2% used for local anesthesia. A small stab  incision was made close to the nexplanon with scalpel. Hemostats were used to withdraw the nexplanon. No complications.   Implanon removed from packaging,  Device confirmed in needle, then inserted full length of needle and withdrawn per handbook instructions into already made stab wound.  Pt and provider palpated nexplanon under skin. A small bandage was applied over a steri strip; Patient given follow up instructions should she experience redness, swelling at site or fever in the next 24 hours.  Minimal blood loss.  Pt tolerated the procedure well.      Assessment:     Bailey Hooper is a 49 y.o. female here for nexplanon removal and insertion.    Plan:     # See problem list and after visit summary for problem-specific plans.   # Health Maintenance: Not discussed  Follow-up: Follow up PRN.    Hilton Sinclair, MD [place]

## 2014-07-09 ENCOUNTER — Encounter: Payer: Self-pay | Admitting: Family Medicine

## 2014-12-14 ENCOUNTER — Ambulatory Visit: Payer: 59 | Admitting: Family Medicine

## 2015-01-24 ENCOUNTER — Encounter: Payer: Self-pay | Admitting: Family Medicine

## 2015-01-24 ENCOUNTER — Ambulatory Visit (INDEPENDENT_AMBULATORY_CARE_PROVIDER_SITE_OTHER): Payer: 59 | Admitting: Family Medicine

## 2015-01-24 VITALS — BP 142/96 | HR 72 | Temp 97.8°F | Wt 185.0 lb

## 2015-01-24 DIAGNOSIS — J069 Acute upper respiratory infection, unspecified: Secondary | ICD-10-CM

## 2015-01-24 MED ORDER — IPRATROPIUM BROMIDE 0.03 % NA SOLN: 2.0000 | Freq: Two times a day (BID) | NASAL | Status: DC

## 2015-01-24 NOTE — Progress Notes (Signed)
COUGH  Has been coughing for 4 days. Cough is: dry Sputum production: minimal Medications tried: tylenol w/ caffeine, tramadol, phenylephrine  Taking blood pressure medications: no ACE  Symptoms Runny nose: yes Mucous in back of throat: yes Throat burning or reflux: no Wheezing or asthma: no Fever: no Chest Pain: no Shortness of breath: no Leg swelling: no Hemoptysis: no Weight loss: no   HEADACHE  Around the same time as the HA she developed URI sxs.   Onset: 4 days ago Location: right face, has now moved to left face Quality: 9/10 Frequency: constant for the past 2 days Precipitating factors: none identified Prior treatment: Tylenol w/ caffeine; tramadol, phenylephrine.  Associated Symptoms Nausea/vomiting: no, some occasional nausea  Photophobia/phonophobia: yes  Tearing of eyes: no  Sinus pain/pressure: yes  Family hx migraine: yes, mother  Personal stressors: no  Relation to menstrual cycle: no, has nexplanon   Red Flags Fever: no  Neck pain/stiffness: yes, neck pain  Vision/speech/swallow/hearing difficulty: yes, right sided blurriness at one time lasting for ~5-57min  Focal weakness/numbness: no  Altered mental status: no  Trauma: no  New type of headache: no  Anticoagulant use: no  H/o cancer/HIV/Pregnancy: no    Objective: BP 142/96 mmHg  Pulse 72  Temp(Src) 97.8 F (36.6 C) (Axillary)  Wt 185 lb (83.915 kg) Gen: NAD, alert, cooperative HEENT: NCAT, EOMI, PERRL, edematous nasal mucosa, pain over maxillary sinuses, TMs clear, OP erythematous/cobblestoned, no exudate, no LAD, no JVD CV: RRR, no murmur Resp: CTAB, no wheezes, non-labored Ext: No edema, warm Neuro: Alert and oriented, Speech clear, No gross deficits  Assessment and plan:  Acute upper respiratory infection Signs and symptoms most consistent with acute upper respiratory infection. Etiology likely viral in nature. Patient is complaining of significant and diffuse upper respiratory  issues. Headache in which she is complaining of also is likely due to sinus congestion and discomfort from this infection. Due to patient's range of symptoms, timeline, and lack of fever at this time I feel as though no antibiotic treatment as deemed necessary. - Atrovent nasal spray for congestion and rhinorrhea. - Encouraged ibuprofen/acetaminophen for discomfort/fevers. - Encouraged hot steamy showers for symptomatic management. - Encouraged adequate fluid intake. - Follow-up when necessary   Meds ordered this encounter  Medications  . ipratropium (ATROVENT) 0.03 % nasal spray    Sig: Place 2 sprays into both nostrils every 12 (twelve) hours.    Dispense:  30 mL    Refill:  1     Elberta Leatherwood, MD,MS,  PGY2 01/25/2015 6:35 PM

## 2015-01-24 NOTE — Patient Instructions (Signed)
It was a pleasure seeing you today in our clinic. Today we discussed your Headaches and congestion. Here is the treatment plan we have discussed and agreed upon together:   - I have prescribed Atrovent nasal spray. Use this 2 times a day as needed for congestion. - I encourage taking hot steamy showers, breathe in deeply through your nose. - If you develop any fevers use tylenol or ibuprofen to help with the discomfort.

## 2015-01-25 DIAGNOSIS — J069 Acute upper respiratory infection, unspecified: Secondary | ICD-10-CM | POA: Insufficient documentation

## 2015-01-25 NOTE — Assessment & Plan Note (Signed)
Signs and symptoms most consistent with acute upper respiratory infection. Etiology likely viral in nature. Patient is complaining of significant and diffuse upper respiratory issues. Headache in which Bailey Hooper is complaining of also is likely due to sinus congestion and discomfort from this infection. Due to patient's range of symptoms, timeline, and lack of fever at this time I feel as though no antibiotic treatment as deemed necessary. - Atrovent nasal spray for congestion and rhinorrhea. - Encouraged ibuprofen/acetaminophen for discomfort/fevers. - Encouraged hot steamy showers for symptomatic management. - Encouraged adequate fluid intake. - Follow-up when necessary

## 2015-01-31 ENCOUNTER — Encounter: Payer: Self-pay | Admitting: Internal Medicine

## 2015-01-31 ENCOUNTER — Other Ambulatory Visit (HOSPITAL_COMMUNITY)
Admission: RE | Admit: 2015-01-31 | Discharge: 2015-01-31 | Disposition: A | Discharge status: Home / Self Care | Payer: 59 | Source: Ambulatory Visit | Attending: Family Medicine | Admitting: Family Medicine

## 2015-01-31 ENCOUNTER — Ambulatory Visit (INDEPENDENT_AMBULATORY_CARE_PROVIDER_SITE_OTHER): Payer: 59 | Admitting: Internal Medicine

## 2015-01-31 VITALS — BP 148/98 | HR 75 | Temp 98.3°F | Ht 66.0 in | Wt 187.1 lb

## 2015-01-31 DIAGNOSIS — Z1151 Encounter for screening for human papillomavirus (HPV): Secondary | ICD-10-CM | POA: Diagnosis present

## 2015-01-31 DIAGNOSIS — Z01419 Encounter for gynecological examination (general) (routine) without abnormal findings: Secondary | ICD-10-CM

## 2015-01-31 NOTE — Progress Notes (Deleted)
[facility] Phone: [phone]  Subjective:    All other ROS were reviewed and are negative unless otherwise noted in the HPI. Past Medical History Reviewed problem list.  Medications- reviewed and updated Current Outpatient Prescriptions  Medication Sig Dispense Refill  . albuterol (PROVENTIL HFA;VENTOLIN HFA) 108 (90 BASE) MCG/ACT inhaler Inhale 2 puffs into the lungs every 6 (six) hours as needed for wheezing.     Marland Kitchen ALPRAZolam (XANAX) 0.25 MG tablet Take 1 tablet (0.25 mg total) by mouth daily as needed for anxiety. If not needed, do not take. 10 tablet 0  . atorvastatin (LIPITOR) 40 MG tablet Take 1 tablet (40 mg total) by mouth every evening. 90 tablet 1  . azelastine (OPTIVAR) 0.05 % ophthalmic solution Place 1 drop into both eyes 2 (two) times daily. 6 mL 12  . cephALEXin (KEFLEX) 500 MG capsule Take 1 capsule (500 mg total) by mouth 3 (three) times daily. 21 capsule 2  . chlorhexidine (HIBICLENS) 4 % external liquid Apply topically daily as needed. 120 mL 3  . diazepam (VALIUM) 2 MG tablet Take 1 tablet (2 mg total) by mouth every 12 (twelve) hours as needed for muscle spasms (spasms). 8 tablet 0  . etonogestrel (IMPLANON) 68 MG IMPL implant Inject 1 each into the skin once.    . fluconazole (DIFLUCAN) 150 MG tablet Take 1 tablet (150 mg total) by mouth daily. 14 tablet 0  . fluocinonide ointment (LIDEX) 7.61 % Apply 1 application topically 2 (two) times daily. To the scalp. For 7 days. 30 g 0  . fluticasone (FLONASE) 50 MCG/ACT nasal spray Place 2 sprays into both nostrils daily. 16 g 6  . hydrocortisone 2.5 % cream Apply topically 2 (two) times daily. 30 g 0  . ipratropium (ATROVENT) 0.03 % nasal spray Place 2 sprays into both nostrils every 12 (twelve) hours. 30 mL 1  . loratadine (CLARITIN) 10 MG tablet Take 1 tablet (10 mg total) by mouth daily. 30 tablet 11  . losartan-hydrochlorothiazide (HYZAAR) 100-25 MG per tablet Take 1 tablet by mouth daily. 30  tablet 2  . lubiprostone (AMITIZA) 8 MCG capsule Take 8 mcg by mouth 2 (two) times daily with a meal.    . metroNIDAZOLE (FLAGYL) 500 MG tablet Take 1 tablet (500 mg total) by mouth 2 (two) times daily. For 7 days. Do not drink alcohol while taking this medicine. 14 tablet 0  . neomycin-polymyxin-dexamethasone (MAXITROL) 0.1 % ophthalmic suspension Place 1 drop into the left eye 4 (four) times daily as needed. 5 mL 0  . nystatin cream (MYCOSTATIN) APPLY EXTERNALLY TO THE AFFECTED AREA TWICE DAILY 30 g 0  . pantoprazole (PROTONIX) 40 MG tablet Take 1 tablet (40 mg total) by mouth daily. 30 tablet 6  . sulfamethoxazole-trimethoprim (BACTRIM DS,SEPTRA DS) 800-160 MG per tablet Take 1 tablet by mouth 2 (two) times daily. 20 tablet 0  . SUMAtriptan (IMITREX) 50 MG tablet Take 50 mg by mouth every 2 (two) hours as needed for migraine.     . traMADol (ULTRAM) 50 MG tablet Take 1 tablet (50 mg total) by mouth every 8 (eight) hours as needed for pain. 15 tablet 0  . traMADol (ULTRAM) 50 MG tablet Take 1 tablet (50 mg total) by mouth every 12 (twelve) hours as needed for severe pain. 10 tablet 0   No current facility-administered medications for this visit.   Chief complaint-noted Family history reviewed for today's visit. No changes. Social history- patient is a *** smoker  Objective: BP 168/106 mmHg  Pulse 75  Temp(Src) 98.3 F (36.8 C) (Oral)  Ht 5\' 6"  (1.676 m)  Wt 187 lb 2 oz (84.879 kg)  BMI 30.22 kg/m2 Gen: NAD, alert, cooperative with exam HEENT: NCAT, EOMI, PERRL, TMs nml Neck: FROM, supple CV: RRR, good S1/S2, no murmur, cap refill <3 Resp: CTABL, no wheezes, non-labored GI: SNTND, BS present, no guarding or organomegaly Msk: No edema, warm, normal tone, moves UE/LE spontaneously Neuro: Alert and oriented, no gross deficits Skin: no rashes, no lesions Psych: Appropriate behavior  Assessment/Plan: See problem based a/p   Hyman Bible, MD PGY-1

## 2015-01-31 NOTE — Progress Notes (Signed)
Subjective:     Bailey Hooper is a 49 y.o. female and is here for a comprehensive physical exam. The patient has a few things she would like to talk about today. Has been seen by another provider outside the clinic, who changed her blood pressure medications. Gets headaches with activities. Having increased allergies. Thinks she takes Singulair. Was seen last week for allergies and started on Ipratropium bromide nasal spray. Has been helping.  Patient is also having some shortness of breath. Possibly worse with activity. Has taken Albuterol in the past for asthma, but has not had a prescription for this in quite some time.  Social History   Social History  . Marital Status: Divorced    Spouse Name: N/A  . Number of Children: N/A  . Years of Education: N/A   Occupational History  . Not on file.   Social History Main Topics  . Smoking status: Current Every Day Smoker -- 0.25 packs/day    Types: Cigarettes  . Smokeless tobacco: Not on file     Comment: trying to quitt. does not smoke, when sick  . Alcohol Use: No  . Drug Use: No  . Sexual Activity: Yes    Birth Control/ Protection: Implant   Other Topics Concern  . 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 INFLUENZA VACCINE  07/08/2015 (Originally 11/08/2014)  . PAP SMEAR  02/12/2017  . TETANUS/TDAP  04/15/2019  . HIV Screening  Completed    Review of Systems A comprehensive review of systems was negative except for: headaches , ringing in ears, difficulty swallowing, nausea, urinary frequency, vaginal discharge, dizziness, sadness, anxiety, stress, memory problems  Objective:    General appearance: alert and cooperative HEENT: [place]/AT, EOMI, MMM Neck: no adenopathy, supple Lungs: expiratory wheezes in mid and low lung fields bilaterally, normal work of  breathing Heart: regular rate and rhythm, S1, S2 normal, no murmur, click, rub or gallop Abdomen: soft, non-tender; bowel sounds normal; no masses, no organomegaly Pelvic: external genitalia normal, no lesions, cervix normal in appearance, vaginal walls are normal in appearance, white discharge present, no cervical motion tenderness, skin tag present 3-4 cm posterior to R labia majora Extremities: extremities normal, atraumatic, no cyanosis or edema Pulses: 2+ and symmetric DP pulses Skin: Skin color, texture, turgor normal. No rashes or lesions Neurologic: Alert and oriented X 3, normal strength and tone. Normal symmetric reflexes. Normal coordination and gait    Assessment:    Healthy female exam. Pap smear performed. Patient interested in quitting smoking. PHQ-9 is a 20.    Plan:  - Refer to Dr. Valentina Hooper for smoking cessation. May consider PFTs. Expiratory wheezing present on exam. Does not currently take any medications for asthma. - Patient has seen another primary care provider who has changed around a lot of her medications recently. She does not know which medications she currently takes and she did not bring her medications to clinic with her. Advised Patient to bring medications in with her next time. - Patient to follow-up with me ASAP for BP, depression, and headaches. Did not have time to cover these things today in addition to CPE and Pap.  - Will recheck PHQ-9 at follow-up appointment - Spoke with social work, Patient given list of dentists who accept patients without  dental insurance - Will follow-up with Pap smear results.

## 2015-01-31 NOTE — Patient Instructions (Addendum)
It was so nice to meet you! Thank you for coming into clinic today.  I will send you the results of your pap in the mail. You should receive this in 2-4 weeks.  Please come back to see me as soon as possible so that we can get your blood pressure and headaches under control.   On your way out, please schedule an appointment for smoking cessation with Dr. Valentina Lucks.  -Dr. Brett Albino

## 2015-02-01 ENCOUNTER — Encounter: Payer: Self-pay | Admitting: Internal Medicine

## 2015-02-01 LAB — CYTOLOGY - PAP

## 2015-02-09 ENCOUNTER — Telehealth: Payer: Self-pay | Admitting: Internal Medicine

## 2015-02-09 ENCOUNTER — Other Ambulatory Visit: Payer: 59

## 2015-02-09 NOTE — Telephone Encounter (Signed)
I spoke with Ms. Goertzen on the phone this evening, because she has been contacting our office numerous times regarding labs and medication refills for a yeast infection. She has an appointment with me on Wednesday, November 9th. I advised her to have her annual labs performed at this time. I have also called in prescriptions to her preferred pharmacy [facility]). I let her know that these would be available for pick-up tonight. The prescriptions are:  Diflucan 150mg , 2 refills. Take 1 tablet. Repeat in 3 days if not better.  Nystatin and Triamcinolone Acetonide cream, 15g tube. Apply twice daily.  Hydrocortisone 2.5% cream. Apply topically twice daily.   Hyman Bible, MD

## 2015-02-09 NOTE — Progress Notes (Unsigned)
Pt was to have labs done today, however the pt left before being called to lab [facility] Bailey Hooper

## 2015-02-09 NOTE — Telephone Encounter (Signed)
Returned call.  She is requesting to have Diflucan for 2 weeks and the "cream that was given to her by UC last November".  I asked if she was talking about the nystatin cream, but she states that she thought it was mixed with something else.  She will call her pharmacy and have them send a refill request.   Of note, pt states that she is having the same type of discharge that she had when they gave her the above meds.  She is concerned because she does not want to come back in for another appointment and take off another work day because she was just here for a physical and told the MD that she was having the discharge. Laketha Leopard, [name]

## 2015-02-09 NOTE — Telephone Encounter (Signed)
Per Devoria Glassing she called to [facility] and stated that pt came in and wanted to have labs done as this was not done at her CPE and wanted to have a UA collected as well. Please advise. Charna Neeb, CMA.

## 2015-02-09 NOTE — Telephone Encounter (Signed)
Pt called and wanted to speak to a nurse about a medication she received at the [facility] for a yeast/UTI. She said we have given her a prescription but it didn't work so it got worse and she went to the UC and they gave her something else that did work. She now has this issue again and wanted to see if we can call in that medication for her. jw

## 2015-02-09 NOTE — Telephone Encounter (Signed)
Pt was seen in October for a pap and physical. She wanted lab work and we didn't do that. She would to have all her labs done on the 9th when she is back in with the doctor. jw

## 2015-02-09 NOTE — Telephone Encounter (Signed)
Pt calling again, wants to speak with a supervisor, forwarding to [place].

## 2015-02-09 NOTE — Telephone Encounter (Signed)
Will forward to MD to see if patient will need to be seen to re-evaluated for a yeast infection.  [facility] Stfleur,CMA

## 2015-02-09 NOTE — Telephone Encounter (Signed)
I spoke with Bailey Hooper on the phone this evening, because she has been contacting our office numerous times regarding labs and medication refills for a yeast infection. She has an appointment with me on Wednesday, November 9th. I advised her to have her annual labs performed at this time. I have also called in prescriptions to her preferred pharmacy [facility]). I let her know that these would be available for pick-up tonight. The prescriptions are:  Diflucan 150mg , 2 refills. Take 1 tablet. Repeat in 3 days if not better.  Nystatin and Triamcinolone Acetonide cream, 15g tube. Apply twice daily.  Hydrocortisone 2.5% cream. Apply topically twice daily.   Hyman Bible, MD

## 2015-02-10 ENCOUNTER — Other Ambulatory Visit: Payer: Self-pay | Admitting: *Deleted

## 2015-02-10 MED ORDER — NYSTATIN 100000 UNIT/GM EX POWD: CUTANEOUS | Status: DC

## 2015-02-16 ENCOUNTER — Ambulatory Visit: Payer: 59 | Admitting: Internal Medicine

## 2015-02-16 ENCOUNTER — Telehealth: Payer: Self-pay | Admitting: Internal Medicine

## 2015-02-16 ENCOUNTER — Other Ambulatory Visit: Payer: Self-pay | Admitting: Internal Medicine

## 2015-02-16 DIAGNOSIS — E785 Hyperlipidemia, unspecified: Secondary | ICD-10-CM

## 2015-02-16 DIAGNOSIS — R300 Dysuria: Secondary | ICD-10-CM

## 2015-02-16 DIAGNOSIS — I10 Essential (primary) hypertension: Secondary | ICD-10-CM

## 2015-02-16 NOTE — Telephone Encounter (Signed)
Will forward to Dr. Brett Albino to see if labs can be placed as future. Jazmin Hartsell,CMA

## 2015-02-16 NOTE — Telephone Encounter (Signed)
Spoke with patient and she is aware of future labs.  Noted in her appt today that it was cancelled on same day. Shavonne Ambroise,CMA

## 2015-02-16 NOTE — Telephone Encounter (Signed)
Orders for a CMET and Lipid Panel have been placed. Bailey Hooper can have these done when she is here for her appointment with Dr. Valentina Lucks.  Hyman Bible, MD PGY-1

## 2015-02-16 NOTE — Telephone Encounter (Signed)
Pt called because Bailey Hooper had a family emergency and will not be able to make today's appointment. Bailey Hooper was going to ask the doctor to do her labs today. The patient has an appointment with DR. Valentina Lucks on the 14 th and was hoping the doctor could put in orders for the labs so Bailey Hooper can get them done on the 14 th while Bailey Hooper is here. jw

## 2015-02-16 NOTE — Telephone Encounter (Signed)
Labs have been ordered for Lipid Panel and CMET. Ms. Masley can have these labs drawn when she sees Dr. Valentina Lucks.

## 2015-02-21 ENCOUNTER — Ambulatory Visit (INDEPENDENT_AMBULATORY_CARE_PROVIDER_SITE_OTHER): Payer: 59 | Admitting: Pharmacist

## 2015-02-21 ENCOUNTER — Encounter: Payer: Self-pay | Admitting: Pharmacist

## 2015-02-21 DIAGNOSIS — Z716 Tobacco abuse counseling: Secondary | ICD-10-CM

## 2015-02-21 MED ORDER — NICOTINE POLACRILEX 2 MG MT GUM: 2.0000 mg | CHEWING_GUM | OROMUCOSAL | Status: DC | PRN

## 2015-02-21 MED ORDER — NICOTINE 10 MG IN INHA: 1.0000 | RESPIRATORY_TRACT | Status: DC | PRN

## 2015-02-21 NOTE — Progress Notes (Signed)
S:  Patient arrives in good spirits.   Patient arrives for evaluation/assistance with tobacco dependence.   Age when started using tobacco on a daily basis 21. Number of Cigarettes per day 4-5. Brand smoked [place]. Estimated Nicotine Content per Cigarette (mg) 1.  Estimated Nicotine intake per day 5 mg.   Smokes first cigarette 60 minutes after waking. Denies waking to smoke    Estimated Fagerstrom Score 4/10.   Longest time ever been tobacco free 3-4 months with NO nicotine replacement therapy or any other pharmacological therapy. Patient does endorse using the 1-800-QUIT NOW line in previous quit attempts with mild success.  Medications (NRT, bupropion, varenicline) used in prior in past cessation efforts include: Never tried NRT or medications.   Rates IMPORTANCE of quitting tobacco on 1-10 scale of 10. Rates CONFIDENCE of quitting tobacco on 1-10 scale of 8.  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   Quit date: November 30  Initiated nicotine replacement tx with nicotine inhaler. Also prescribed nicotine gum in case patients insurance will not cover inhaler. Patient counseled on purpose, proper use, and potential adverse effects, including how long nicotine cartridges last, signs of excess nicotine intake, chew and park with nicotine gum.    Written information provided. Provided information on 1 800-QUIT NOW support program.  F/U Rx Clinic Visit on December 15.   Total time in face-to-face counseling 60 minutes.  Patient seen with Liliane Shi, PharmD Candidate, and Angela Burke, PharmD  Resident. Marland Kitchen

## 2015-02-21 NOTE — Progress Notes (Signed)
Patient ID: Bailey Hooper, female   DOB: 11-26-1965, 49 y.o.   MRN: [id number] Reviewed: Agree with Dr. Graylin Shiver documentation and management.

## 2015-02-21 NOTE — Assessment & Plan Note (Signed)
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   Quit date: November 30  Initiated nicotine replacement tx with nicotine inhaler. Also prescribed nicotine gum in case patients insurance will not cover inhaler. Patient counseled on purpose, proper use, and potential adverse effects, including how long nicotine cartridges last, signs of excess nicotine intake, chew and park with nicotine gum.

## 2015-02-21 NOTE — Patient Instructions (Signed)
Thank you for coming today!  Today we discussed quitting smoking. You have decided to set your quit date as November 30th. We have sent a prescription for your nicotine inhaler to your pharmacy, as well as nicotine gum if the inhaler is not covered.  Please follow up with Dr. Brett Albino in 2 weeks and with pharmacy clinic for lung function tests December 15th.

## 2015-03-02 ENCOUNTER — Telehealth: Payer: Self-pay | Admitting: Internal Medicine

## 2015-03-02 NOTE — Telephone Encounter (Signed)
Spoke with patient regarding her symptoms.  Patient stated she stopped taking the medications a while back and changed her diet.  The pass few days she has been off her diet and eating a lot of spicy foods.  Now she is having the symptoms.  Advised patient to restart her medications.  It could take a while before her body can get adjusted to the medications again.  She asked if she could take OTC medication to help her have a good bowel movement.  Patient told she could try miralax, prune juice, increase her fiber and water intake.  If her symptoms worsen or not improved to call clinic for an appointment.  Patient stated understanding.  Will forward to PCP for further advise.  Derl Barrow, RN

## 2015-03-02 NOTE — Telephone Encounter (Signed)
Patient calling and is asking to speak to a nurse because she has IBS, heartburn, burping, constipation, discomfort in chest on left side -->possibly gas? Taking protonix, amitiza, wondering if she should take something else OTC. Thank you, Fonda Kinder, ASA

## 2015-03-11 ENCOUNTER — Encounter: Payer: Self-pay | Admitting: Internal Medicine

## 2015-03-11 ENCOUNTER — Other Ambulatory Visit: Payer: Self-pay

## 2015-03-11 ENCOUNTER — Ambulatory Visit (INDEPENDENT_AMBULATORY_CARE_PROVIDER_SITE_OTHER): Payer: 59 | Admitting: Internal Medicine

## 2015-03-11 VITALS — BP 134/84 | HR 75 | Temp 98.2°F | Ht 67.0 in | Wt 187.5 lb

## 2015-03-11 DIAGNOSIS — I10 Essential (primary) hypertension: Secondary | ICD-10-CM | POA: Diagnosis not present

## 2015-03-11 DIAGNOSIS — G4489 Other headache syndrome: Secondary | ICD-10-CM

## 2015-03-11 DIAGNOSIS — G43009 Migraine without aura, not intractable, without status migrainosus: Secondary | ICD-10-CM

## 2015-03-11 DIAGNOSIS — E559 Vitamin D deficiency, unspecified: Secondary | ICD-10-CM | POA: Diagnosis not present

## 2015-03-11 DIAGNOSIS — Z1231 Encounter for screening mammogram for malignant neoplasm of breast: Secondary | ICD-10-CM

## 2015-03-11 DIAGNOSIS — E785 Hyperlipidemia, unspecified: Secondary | ICD-10-CM | POA: Diagnosis not present

## 2015-03-11 DIAGNOSIS — J309 Allergic rhinitis, unspecified: Secondary | ICD-10-CM

## 2015-03-11 DIAGNOSIS — F411 Generalized anxiety disorder: Secondary | ICD-10-CM

## 2015-03-11 LAB — LIPID PANEL
CHOL/HDL RATIO: 4.1 ratio (ref <=5.0)
Cholesterol: 144 mg/dL (ref 125–200)
HDL: 35 mg/dL — AB (ref >=46)
LDL Cholesterol: 90 mg/dL (ref <130)
Triglycerides: 96 mg/dL (ref <150)
VLDL: 19 mg/dL (ref <30)

## 2015-03-11 LAB — COMPREHENSIVE METABOLIC PANEL
ALK PHOS: 69 U/L (ref 33–115)
ALT: 15 U/L (ref 6–29)
AST: 12 U/L (ref 10–35)
Albumin: 4.2 g/dL (ref 3.6–5.1)
BUN: 11 mg/dL (ref 7–25)
CALCIUM: 9.1 mg/dL (ref 8.6–10.2)
CO2: 27 mmol/L (ref 20–31)
Chloride: 106 mmol/L (ref 98–110)
Creat: 0.72 mg/dL (ref 0.50–1.10)
GLUCOSE: 81 mg/dL (ref 65–99)
Potassium: 3.9 mmol/L (ref 3.5–5.3)
Sodium: 139 mmol/L (ref 135–146)
Total Bilirubin: 0.5 mg/dL (ref 0.2–1.2)
Total Protein: 7 g/dL (ref 6.1–8.1)

## 2015-03-11 MED ORDER — PROPRANOLOL HCL 40 MG PO TABS: 40.0000 mg | ORAL_TABLET | Freq: Two times a day (BID) | ORAL | Status: DC

## 2015-03-11 MED ORDER — MONTELUKAST SODIUM 10 MG PO TABS: 10.0000 mg | ORAL_TABLET | Freq: Every day | ORAL | Status: DC

## 2015-03-11 MED ORDER — PROMETHAZINE HCL 12.5 MG PO TABS: 12.5000 mg | ORAL_TABLET | Freq: Three times a day (TID) | ORAL | Status: DC | PRN

## 2015-03-11 MED ORDER — CLONAZEPAM 0.5 MG PO TABS: 0.5000 mg | ORAL_TABLET | Freq: Three times a day (TID) | ORAL | Status: DC | PRN

## 2015-03-11 MED ORDER — IPRATROPIUM BROMIDE 0.03 % NA SOLN: 2.0000 | Freq: Two times a day (BID) | NASAL | Status: DC

## 2015-03-11 MED ORDER — LOSARTAN POTASSIUM-HCTZ 100-25 MG PO TABS: 1.0000 | ORAL_TABLET | Freq: Every day | ORAL | Status: DC

## 2015-03-11 MED ORDER — SUMATRIPTAN SUCCINATE 100 MG PO TABS: 100.0000 mg | ORAL_TABLET | Freq: Once | ORAL | Status: DC

## 2015-03-11 MED ORDER — AMLODIPINE BESYLATE 5 MG PO TABS: 10.0000 mg | ORAL_TABLET | Freq: Every day | ORAL | Status: DC

## 2015-03-11 NOTE — Assessment & Plan Note (Signed)
Having ~2 headaches per week. It sounds like she is having many different types of headaches, but the migraine headaches are most predominant and most debilitating. No red flags, neuro exam is normal. Currently using Propranolol 20mg  bid for preventive therapy and Imitrex 50mg  for abortive therapy.  - Will increase Propranolol from 20mg  bid to 40mg  bid to help with both headaches and high blood pressure - Will increase Imitrex from 50mg  to 100mg . Advised Pt to take this whenever she feels a migraine headaches starting - Per Pt request, will also prescribe Phenergan to help with nausea during migraines. - Pt advised to call in 1-2 weeks to let me know if this change in medication is helping to decrease headache frequency/intensity.

## 2015-03-11 NOTE — Patient Instructions (Addendum)
1. Please take 2 tablets (10mg ) of Norvasc. Please continue to keep track of your blood pressures and call me in 1-2 weeks if your blood pressures are still elevated. 2. I have increased your Propranolol from 20mg  to 40mg  twice a day. This should help both your blood pressures and your headaches 3. I have increased your Imitrex from 50mg  to 100mg . Please continue to take this when you have headaches. 4. I have refilled your other medications- Hyzaar, Singulair, Klonopin, nasal spray, and Phenergan. 5. I will send you a letter in the mail with your lab results in 2 weeks 6. Please see me in 2-3 weeks to discuss your anxiety further.  It was wonderful to talk with you.

## 2015-03-11 NOTE — Assessment & Plan Note (Signed)
Somewhat controlled on current regimen.  - Refill Ipratropium nasal saline and Singulair - Pt to follow-up as needed

## 2015-03-11 NOTE — Progress Notes (Signed)
[facility] Phone: [phone]  Subjective:  Blood pressure, uncontrolled: Checks BPs once a day at home. Usually in the 160s/100s. She follows a low salt diet at home. Has cut down on the amount of fried foods that she eats. No chest pain, no LE edema, no shortness of breath. No problems tolerating BP medications. Currently taking Norvasc, Hyzaar 100-25mg , Propranolol. She is lightheaded sometimes in the morning.  Headaches, uncontrolled: Feels like she has tension headaches, sinus headaches, migraine headaches, and cluster headaches. Sometimes her headaches occur in her face, over her temples, sometimes on one side, sometimes she has a sharp pain behind one eye. Normally has ~2 headaches per week. Used to take Imitrex for headaches, but that stopped working. She took it for 5 years. She doesn't feel like her headaches are related to high blood pressures. Currently takes Propranolol and an OTC tension headache medication. Sometimes has blurry vision with the headaches. Headaches sometimes wake her up from sleep. Occasionally has vomiting. Sometimes feels off balance with headaches.  Sometimes has photophobia and phonophobia. No fevers or chills.   Allergic rhinitis: Has had sinus issues her whole life. Has used Flonase in the past, but it doesn't work well for her. Currently using Ipratropium nasal saline and would like a refill of this. Endorses occasional facial pain and headaches occurring in the front of her face. Endorses frequent runny nose and occasional fevers.  All other ROS were reviewed and are negative unless otherwise noted in the HPI. Past Medical History- HTN, migraines, allergic rhinitis, asthma Reviewed problem list.  Medications- reviewed and updated Current Outpatient Prescriptions  Medication Sig Dispense Refill  . albuterol (PROVENTIL HFA;VENTOLIN HFA) 108 (90 BASE) MCG/ACT inhaler Inhale 2 puffs into the lungs every 6 (six) hours as needed for  wheezing.     Marland Kitchen ALPRAZolam (XANAX) 0.25 MG tablet Take 1 tablet (0.25 mg total) by mouth daily as needed for anxiety. If not needed, do not take. (Patient not taking: Reported on 02/21/2015) 10 tablet 0  . amLODipine (NORVASC) 5 MG tablet Take 5 mg by mouth daily.    Marland Kitchen atorvastatin (LIPITOR) 40 MG tablet Take 1 tablet (40 mg total) by mouth every evening. 90 tablet 1  . azelastine (OPTIVAR) 0.05 % ophthalmic solution Place 1 drop into both eyes 2 (two) times daily. 6 mL 12  . cephALEXin (KEFLEX) 500 MG capsule Take 1 capsule (500 mg total) by mouth 3 (three) times daily. 21 capsule 2  . chlorhexidine (HIBICLENS) 4 % external liquid Apply topically daily as needed. 120 mL 3  . clonazePAM (KLONOPIN) 0.5 MG tablet Take 0.5 mg by mouth 3 (three) times daily as needed for anxiety.    . Dentifrices (Hull DT) Place onto teeth as needed.    . diazepam (VALIUM) 2 MG tablet Take 1 tablet (2 mg total) by mouth every 12 (twelve) hours as needed for muscle spasms (spasms). (Patient not taking: Reported on 02/21/2015) 8 tablet 0  . dicyclomine (BENTYL) 20 MG tablet Take 20 mg by mouth every 6 (six) hours.    Marland Kitchen etonogestrel (IMPLANON) 68 MG IMPL implant Inject 1 each into the skin once.    . fluconazole (DIFLUCAN) 150 MG tablet Take 1 tablet (150 mg total) by mouth daily. 14 tablet 0  . fluocinonide ointment (LIDEX) [id number] % Apply 1 application topically 2 (two) times daily. To the scalp. For 7 days. (Patient not taking: Reported on 02/21/2015) 30 g 0  . fluticasone (FLONASE)  50 MCG/ACT nasal spray Place 2 sprays into both nostrils daily. (Patient not taking: Reported on 02/21/2015) 16 g 6  . hydrocortisone 2.5 % cream Apply topically 2 (two) times daily. 30 g 0  . ipratropium (ATROVENT) 0.03 % nasal spray Place 2 sprays into both nostrils every 12 (twelve) hours. 30 mL 1  . loratadine (CLARITIN) 10 MG tablet Take 1 tablet (10 mg total) by mouth daily. (Patient not taking: Reported on 02/21/2015)  30 tablet 11  . losartan-hydrochlorothiazide (HYZAAR) 100-25 MG per tablet Take 1 tablet by mouth daily. (Patient not taking: Reported on 02/21/2015) 30 tablet 2  . lubiprostone (AMITIZA) 8 MCG capsule Take 8 mcg by mouth 2 (two) times daily with a meal.    . metroNIDAZOLE (FLAGYL) 500 MG tablet Take 1 tablet (500 mg total) by mouth 2 (two) times daily. For 7 days. Do not drink alcohol while taking this medicine. (Patient not taking: Reported on 02/21/2015) 14 tablet 0  . montelukast (SINGULAIR) 10 MG tablet Take 10 mg by mouth at bedtime.    . Multiple Vitamin (MULTIVITAMIN) capsule Take 1 capsule by mouth daily.    Marland Kitchen neomycin-polymyxin-dexamethasone (MAXITROL) 0.1 % ophthalmic suspension Place 1 drop into the left eye 4 (four) times daily as needed. (Patient not taking: Reported on 02/21/2015) 5 mL 0  . nicotine (NICOTROL) 10 MG inhaler Inhale 1 cartridge (1 continuous puffing total) into the lungs as needed for smoking cessation. 168 each 1  . nicotine polacrilex (NICORETTE) 2 MG gum Take 1 each (2 mg total) by mouth as needed for smoking cessation. 1 each 1  . nystatin (MYCOSTATIN/NYSTOP) 100000 UNIT/GM POWD Apply to irritated vulvar area (not mucous membranes) twice daily for 1 week 30 g 0  . nystatin cream (MYCOSTATIN) APPLY EXTERNALLY TO THE AFFECTED AREA TWICE DAILY (Patient not taking: Reported on 02/21/2015) 30 g 0  . pantoprazole (PROTONIX) 40 MG tablet Take 1 tablet (40 mg total) by mouth daily. 30 tablet 6  . propranolol (INDERAL) 20 MG tablet Take 20 mg by mouth 2 (two) times daily.    . SUMAtriptan (IMITREX) 50 MG tablet Take 50 mg by mouth every 2 (two) hours as needed for migraine.     . traMADol (ULTRAM) 50 MG tablet Take 1 tablet (50 mg total) by mouth every 8 (eight) hours as needed for pain. 15 tablet 0  . traMADol (ULTRAM) 50 MG tablet Take 1 tablet (50 mg total) by mouth every 12 (twelve) hours as needed for severe pain. 10 tablet 0  . traZODone (DESYREL) 50 MG tablet Take  50-100 mg by mouth at bedtime as needed for sleep.    . Vilazodone HCl (VIIBRYD) 40 MG TABS Take 40 mg by mouth daily.    . Vitamin D, Ergocalciferol, (DRISDOL) 50000 UNITS CAPS capsule Take 50,000 Units by mouth every 7 (seven) days.     No current facility-administered medications for this visit.   Chief complaint-noted Family history reviewed for today's visit. No changes. Social history- patient is a current smoker  Objective: BP 144/90 mmHg  Pulse 75  Temp(Src) 98.2 F (36.8 C) (Oral)  Ht 5\' 7"  (1.702 m)  Wt 187 lb 8 oz (85.049 kg)  BMI 29.36 kg/m2 Gen: NAD, alert, cooperative with exam HEENT: NCAT, EOMI, PERRL, MMM Neck: FROM, supple CV: RRR, good S1/S2, no murmur, 2+ DP pulses Resp: CTABL, no wheezes, non-labored GI: SNTND, BS present, no guarding or organomegaly Msk: No edema, warm, normal tone, moves UE/LE spontaneously Neuro: Alert and oriented;  5/5 muscle strength in the upper and lower extremities bilaterally; normal sensation throughout; normal finger-to-nose bilaterally; normal patellar reflexes bilaterally  Skin: no rashes, no lesions Psych: Appropriate behavior  Assessment/Plan: See problem based a/p   Hyman Bible, MD PGY-1

## 2015-03-11 NOTE — Assessment & Plan Note (Addendum)
BP is uncontrolled at home with systolics in the [id number]. In the office today, BP was 144/90, recheck was 134/84. Goal for her is <140/<90. Current regimen: Losartan-HCTZ 100-25mg  daily, Norvasc 5mg  daily, Propranolol 20mg  bid. - Refilled Losartan-HCTZ - Will increase Norvasc from 5mg  daily to 10mg  daily - Will increase Propranolol from 20mg  bid to 40mg  bid to help with headaches and high blood pressure. - CMET and lipid panel  - Advised Pt to continue checking BPs daily and to call in 1-2 weeks to let me know if they are still elevated - Follow-up in 3 months.

## 2015-03-12 LAB — VITAMIN D 25 HYDROXY (VIT D DEFICIENCY, FRACTURES): VIT D 25 HYDROXY: 26 ng/mL — AB (ref 30–100)

## 2015-03-14 ENCOUNTER — Encounter: Payer: Self-pay | Admitting: Internal Medicine

## 2015-03-30 ENCOUNTER — Ambulatory Visit
Admission: RE | Admit: 2015-03-30 | Discharge: 2015-03-30 | Disposition: A | Discharge status: Home / Self Care | Payer: 59 | Source: Ambulatory Visit

## 2015-03-30 DIAGNOSIS — Z1231 Encounter for screening mammogram for malignant neoplasm of breast: Secondary | ICD-10-CM

## 2015-04-19 ENCOUNTER — Ambulatory Visit: Payer: Self-pay | Admitting: Family Medicine

## 2015-04-20 ENCOUNTER — Encounter: Payer: Self-pay | Admitting: Obstetrics and Gynecology

## 2015-04-20 ENCOUNTER — Ambulatory Visit (INDEPENDENT_AMBULATORY_CARE_PROVIDER_SITE_OTHER): Payer: BLUE CROSS/BLUE SHIELD | Admitting: Obstetrics and Gynecology

## 2015-04-20 VITALS — BP 141/91 | HR 73 | Temp 98.3°F | Wt 184.9 lb

## 2015-04-20 DIAGNOSIS — R21 Rash and other nonspecific skin eruption: Secondary | ICD-10-CM | POA: Diagnosis not present

## 2015-04-20 DIAGNOSIS — N898 Other specified noninflammatory disorders of vagina: Secondary | ICD-10-CM | POA: Diagnosis not present

## 2015-04-20 LAB — POCT WET PREP (WET MOUNT): Clue Cells Wet Prep Whiff POC: NEGATIVE

## 2015-04-20 LAB — POCT SKIN KOH: SKIN KOH, POC: NEGATIVE

## 2015-04-20 MED ORDER — CLOTRIMAZOLE 1 % EX CREA: 1.0000 "application " | TOPICAL_CREAM | Freq: Two times a day (BID) | CUTANEOUS | Status: DC | PRN

## 2015-04-20 MED ORDER — FLUCONAZOLE 150 MG PO TABS: 150.0000 mg | ORAL_TABLET | Freq: Every day | ORAL | Status: DC

## 2015-04-20 MED ORDER — NYSTATIN 100000 UNIT/GM EX POWD: CUTANEOUS | Status: DC

## 2015-04-20 NOTE — Patient Instructions (Addendum)
Will contact you about your results Sending in fluconazole to take with refill of nystatin and fungal cream. Stop steriod cream Come back in in 3-4 weeks for blood work to determine possible source  Cutaneous Candidiasis Cutaneous candidiasis is a condition in which there is an overgrowth of yeast (candida) on the skin. Yeast normally live on the skin, but in small enough numbers not to cause any symptoms. In certain cases, increased growth of the yeast may cause an actual yeast infection. This kind of infection usually occurs in areas of the skin that are constantly warm and moist, such as the armpits or the groin. Yeast is the most common cause of diaper rash in babies and in people who cannot control their bowel movements (incontinence). CAUSES  The fungus that most often causes cutaneous candidiasis is Candida albicans. Conditions that can increase the risk of getting a yeast infection of the skin include:  Obesity.  Pregnancy.  Diabetes.  Taking antibiotic medicine.  Taking birth control pills.  Taking steroid medicines.  Thyroid disease.  An iron or zinc deficiency.  Problems with the immune system. SYMPTOMS   Red, swollen area of the skin.  Bumps on the skin.  Itchiness. DIAGNOSIS  The diagnosis of cutaneous candidiasis is usually based on its appearance. Light scrapings of the skin may also be taken and viewed under a microscope to identify the presence of yeast. TREATMENT  Antifungal creams may be applied to the infected skin. In severe cases, oral medicines may be needed.  HOME CARE INSTRUCTIONS   Keep your skin clean and dry.  Maintain a healthy weight.  If you have diabetes, keep your blood sugar under control. SEEK IMMEDIATE MEDICAL CARE IF:  Your rash continues to spread despite treatment.  You have a fever, chills, or abdominal pain.   This information is not intended to replace advice given to you by your health care provider. Make sure you discuss  any questions you have with your health care provider.   Document Released: 12/12/2010 Document Revised: 06/18/2011 Document Reviewed: 09/27/2014 Elsevier Interactive Patient Education [employer].

## 2015-04-20 NOTE — Progress Notes (Signed)
   Subjective:   Patient ID: Bailey Hooper, female    DOB: 10-Apr-1965, 50 y.o.   MRN: [id number]  Patient presents for Same Day Appointment  Chief Complaint  Patient presents with  . Rash    middle of buttocks x 2 wks    HPI: #RASH -keeps having a recurrent rash in her genital/anorectal area -first started back in 2015 -was treated with hydrocortisone and nystatin -has had new flair of rash for a couple days now; started after christmas. Location: butt Improving now - was raw Medications tried: hydroticosone and nystatin Similar rash in past:  Yes, present in vulvovaginal area, under panus, in bottom New medications or antibiotics: no New detergent or soap: no Immunocompromised: no Pelvic/vaginal pain: none Sexually active: yes, same partner for a year  Symptoms Itching: yes Pain over rash: no Feeling ill all over: no Fever: no Mouth sores: no Vaginal discharge: yes Burning sensation: yes  Review of Symptoms - see HPI PMH - Smoking status noted.  H/o HTN, IBS, prediabetes  Past medical history, surgical, family, and social history reviewed and updated in the EMR as appropriate.  Objective:  BP 141/91 mmHg  Pulse 73  Temp(Src) 98.3 F (36.8 C) (Oral)  Wt 184 lb 14.4 oz (83.87 kg) Vitals and nursing note reviewed  Physical Exam  Constitutional: She is well-developed, well-nourished, and in no distress.  Cardiovascular: Normal rate.   Pulmonary/Chest: Effort normal.  Genitourinary: Creamy  odorless  white and vaginal discharge found.  Anorectal region with healing non-inflammed circular plaque with a well-demarcated border. Satellite lesions present around perimeter. Scaly in appearance.  Neurological: She is alert.    Assessment & Plan:  1. Rash and nonspecific skin eruption. Anorectal irriation appears to be topical yeast infection. This has been a recurrent issue ove rhte alst 2 years. No know diagnosis other than candidiasis. Despite hx of vulvar boils, no  tissue induration or fluctuance today to suggest abscess or bacterial cellulitis at this point. Area is healing and not inflammed. Patient states it ahs improved since appoitnment was made. Not immunocpromised. No history of diabetes; Last A1c 03/2014 5.7 but random glucose 81 on 12/16. -KOH skin prep -Will refill nystatin powder and diflucan with instructions to repeat in 3 days if not better.  -Instructed to keep area clean and dry.  -Discussedneed to have patient's partner treated for yeast as well. -F/u if not improving. -Return to clinic in 3-4 weeks for lab work-up to rule out other causes of recurrent yeast. Would order TSH, A1c, ESR, CRP, etc. -Handout given  2. Vaginal discharge.  Appears like vulvovaginal candidiassis. Last pap smear was normal. Has been with the same sexual partner for a year. No concern for STD at this time.  - POCT Wet Prep ordered   Luiz Blare, DO 04/20/2015, 2:48 PM PGY-2, [place]

## 2015-04-21 ENCOUNTER — Telehealth: Payer: Self-pay | Admitting: *Deleted

## 2015-04-21 NOTE — Telephone Encounter (Signed)
Spoke with patient and she is aware of results. Kortney Schoenfelder,CMA  

## 2015-04-21 NOTE — Telephone Encounter (Signed)
-----   Message from Katheren Shams, DO sent at 04/21/2015 10:10 AM EST ----- [facility] BLUE team, please call pt and let her know that her labs were normal. No signs of infection or yeast. Follow-up as directed.   Thanks! Katheren Shams, DO

## 2015-05-03 ENCOUNTER — Encounter: Payer: Self-pay | Admitting: Obstetrics and Gynecology

## 2015-05-03 ENCOUNTER — Ambulatory Visit (INDEPENDENT_AMBULATORY_CARE_PROVIDER_SITE_OTHER): Payer: BLUE CROSS/BLUE SHIELD | Admitting: Obstetrics and Gynecology

## 2015-05-03 VITALS — BP 158/94 | HR 74 | Temp 98.7°F | Ht 67.0 in | Wt 190.8 lb

## 2015-05-03 DIAGNOSIS — I10 Essential (primary) hypertension: Secondary | ICD-10-CM | POA: Diagnosis not present

## 2015-05-03 DIAGNOSIS — R21 Rash and other nonspecific skin eruption: Secondary | ICD-10-CM

## 2015-05-03 NOTE — Patient Instructions (Signed)
   Dermatology referral placed  Continue to use the creams and powders prescribed. Remember area needs to stay as dry as possible.    Cutaneous Candidiasis Cutaneous candidiasis is a condition in which there is an overgrowth of yeast (candida) on the skin. Yeast normally live on the skin, but in small enough numbers not to cause any symptoms. In certain cases, increased growth of the yeast may cause an actual yeast infection. This kind of infection usually occurs in areas of the skin that are constantly warm and moist, such as the armpits or the groin. Yeast is the most common cause of diaper rash in babies and in people who cannot control their bowel movements (incontinence). CAUSES  The fungus that most often causes cutaneous candidiasis is Candida albicans. Conditions that can increase the risk of getting a yeast infection of the skin include:  Obesity.  Pregnancy.  Diabetes.  Taking antibiotic medicine.  Taking birth control pills.  Taking steroid medicines.  Thyroid disease.  An iron or zinc deficiency.  Problems with the immune system. SYMPTOMS   Red, swollen area of the skin.  Bumps on the skin.  Itchiness. DIAGNOSIS  The diagnosis of cutaneous candidiasis is usually based on its appearance. Light scrapings of the skin may also be taken and viewed under a microscope to identify the presence of yeast. TREATMENT  Antifungal creams may be applied to the infected skin. In severe cases, oral medicines may be needed.  HOME CARE INSTRUCTIONS   Keep your skin clean and dry.  Maintain a healthy weight.  If you have diabetes, keep your blood sugar under control. SEEK IMMEDIATE MEDICAL CARE IF:  Your rash continues to spread despite treatment.  You have a fever, chills, or abdominal pain.   This information is not intended to replace advice given to you by your health care provider. Make sure you discuss any questions you have with your health care provider.   Document  Released: 12/12/2010 Document Revised: 06/18/2011 Document Reviewed: 09/27/2014 Elsevier Interactive Patient Education [employer].

## 2015-05-03 NOTE — Progress Notes (Signed)
   Subjective:   Patient ID: Bailey Hooper, female    DOB: 12-05-1965, 50 y.o.   MRN: [id number]  Patient presents for Same Day Appointment  Chief Complaint  Patient presents with  . Follow-up    HPI: # Follow-up Rash: -following on rash in anorectal region -still present -has intermittent flares (gets raw and worse after showers) -the creams, powders, and oral medications have not worked -rash worse in her gluteal fold. Raw area near top.  -rash is limiting her love life and she wants it to go away permanently  -requesting to be seen by dermatology -no longer has vaginal discharge  ROS: +itching, denies pain, fever, illness, abdominal or pelvic pain  Review of Systems   See HPI for ROS.   Past medical history, surgical, family, and social history reviewed and updated in the EMR as appropriate.  Objective:  BP 158/94 mmHg  Pulse 74  Temp(Src) 98.7 F (37.1 C) (Oral)  Ht 5\' 7"  (1.702 m)  Wt 190 lb 12.8 oz (86.546 kg)  BMI 29.88 kg/m2 Vitals and nursing note reviewed  Physical Exam  Constitutional: She is well-developed, well-nourished, and in no distress.  Cardiovascular: Normal rate.   Pulmonary/Chest: Effort normal.  Neurological: She is alert.  Skin: Skin is warm and dry. Rash noted.  Anorectal region with inflammed circular plaque near top of gluteal cleft with a well-demarcated border. Area is raw. Satellite lesions present around perimeter that are white, dried and scaly in appearance.    Assessment & Plan:  1. Rash and nonspecific skin eruption Rash still present at this time. Inflamed area noted at the top of her gluteal fold. No signs of infection. Vitals are stable and she is afebrile. At last clinic visit KOH skin scraping negative. Still believe this is a form of candidiasis based on appearance and location (dark, warm, moist environment). Patient not immunocompromised. Offered patient skin biopsy but she refused at this time. Referral placed for dermatology  to further evaluate. She has failed oral fluconazole, nystatin, and OTC yeast creams.   2. Essential hypertension, benign BP noted to be elevated x2 today. Patient admits to not taking medication last night nor on time this morning. Non-compliance. She is on amlodipine and hyzaar. Discussed need for better blood control in patient. She understands. States it is usually well controlled when she takes her medications properly. This is consistent with prior to readings being wnl.    Luiz Blare, DO 05/03/2015, 11:17 AM PGY-2, [place]

## 2015-05-13 ENCOUNTER — Ambulatory Visit: Payer: BLUE CROSS/BLUE SHIELD | Admitting: Obstetrics and Gynecology

## 2015-05-24 ENCOUNTER — Encounter: Payer: Self-pay | Admitting: Internal Medicine

## 2015-06-30 ENCOUNTER — Telehealth: Payer: Self-pay | Admitting: Internal Medicine

## 2015-06-30 NOTE — Telephone Encounter (Signed)
Need to return call.  Patient will receive call at [phone] if called within 30 mins  If later than 30 min call wk # [phone]

## 2015-07-06 ENCOUNTER — Other Ambulatory Visit: Payer: Self-pay | Admitting: Internal Medicine

## 2015-07-06 MED ORDER — DULOXETINE HCL 30 MG PO CPEP: ORAL_CAPSULE | ORAL | Status: DC

## 2015-07-06 NOTE — Telephone Encounter (Signed)
Please call patient back on her cell.  Is at home today.  Number is [phone].

## 2015-07-12 ENCOUNTER — Other Ambulatory Visit: Payer: Self-pay | Admitting: Internal Medicine

## 2015-07-15 ENCOUNTER — Other Ambulatory Visit: Payer: Self-pay | Admitting: Internal Medicine

## 2015-07-15 MED ORDER — CLONAZEPAM 0.5 MG PO TABS: 0.5000 mg | ORAL_TABLET | Freq: Three times a day (TID) | ORAL | Status: DC | PRN

## 2015-07-15 NOTE — Telephone Encounter (Signed)
rx up front and Bailey Hooper called to inform paitent. Bailey Hooper, [name], CMA

## 2015-07-15 NOTE — Telephone Encounter (Signed)
Medication refilled

## 2015-08-29 ENCOUNTER — Ambulatory Visit (INDEPENDENT_AMBULATORY_CARE_PROVIDER_SITE_OTHER): Payer: BLUE CROSS/BLUE SHIELD | Admitting: Family Medicine

## 2015-08-29 ENCOUNTER — Encounter: Payer: Self-pay | Admitting: Family Medicine

## 2015-08-29 VITALS — BP 167/98 | HR 79 | Temp 98.2°F | Wt 181.0 lb

## 2015-08-29 DIAGNOSIS — L600 Ingrowing nail: Secondary | ICD-10-CM | POA: Insufficient documentation

## 2015-08-29 DIAGNOSIS — B351 Tinea unguium: Secondary | ICD-10-CM | POA: Insufficient documentation

## 2015-08-29 NOTE — Patient Instructions (Signed)
Nice to meet you today. I'm referring you to podiatry to look your toenails. It can take up to 2 weeks for someone to process the referral. Someone will call you about an appointment.  Take care, Dr. Marlinda Mike Toenail An ingrown toenail occurs when the corner or sides of your toenail grow into the surrounding skin. The big toe is most commonly affected, but it can happen to any of your toes. If your ingrown toenail is not treated, you will be at risk for infection. CAUSES This condition may be caused by:  Wearing shoes that are too small or tight.  Injury or trauma, such as stubbing your toe or having your toe stepped on.  Improper cutting or care of your toenails.  Being born with (congenital) nail or foot abnormalities, such as having a nail that is too big for your toe. RISK FACTORS Risk factors for an ingrown toenail include:  Age. Your nails tend to thicken as you get older, so ingrown nails are more common in older people.  Diabetes.  Cutting your toenails incorrectly.  Blood circulation problems. SYMPTOMS Symptoms may include:  Pain, soreness, or tenderness.  Redness.  Swelling.  Hardening of the skin surrounding the toe. Your ingrown toenail may be infected if there is fluid, pus, or drainage. DIAGNOSIS  An ingrown toenail may be diagnosed by medical history and physical exam. If your toenail is infected, your health care provider may test a sample of the drainage. TREATMENT Treatment depends on the severity of your ingrown toenail. Some ingrown toenails may be treated at home. More severe or infected ingrown toenails may require surgery to remove all or part of the nail. Infected ingrown toenails may also be treated with antibiotic medicines. HOME CARE INSTRUCTIONS  If you were prescribed an antibiotic medicine, finish all of it even if you start to feel better.  Soak your foot in warm soapy water for 20 minutes, 3 times per day or as directed by your health  care provider.  Carefully lift the edge of the nail away from the sore skin by wedging a small piece of cotton under the corner of the nail. This may help with the pain. Be careful not to cause more injury to the area.  Wear shoes that fit well. If your ingrown toenail is causing you pain, try wearing sandals, if possible.  Trim your toenails regularly and carefully. Do not cut them in a curved shape. Cut your toenails straight across. This prevents injury to the skin at the corners of the toenail.  Keep your feet clean and dry.  If you are having trouble walking and are given crutches by your health care provider, use them as directed.  Do not pick at your toenail or try to remove it yourself.  Take medicines only as directed by your health care provider.  Keep all follow-up visits as directed by your health care provider. This is important. SEEK MEDICAL CARE IF:  Your symptoms do not improve with treatment. SEEK IMMEDIATE MEDICAL CARE IF:  You have red streaks that start at your foot and go up your leg.  You have a fever.  You have increased redness, swelling, or pain.  You have fluid, blood, or pus coming from your toenail.   This information is not intended to replace advice given to you by your health care provider. Make sure you discuss any questions you have with your health care provider.   Document Released: 03/23/2000 Document Revised: 08/10/2014 Document Reviewed:  02/17/2014 Elsevier Interactive Patient Education [employer].

## 2015-08-29 NOTE — Assessment & Plan Note (Signed)
All toenails ingrown and thickened Likely 2/2 onychomycosis No acute infection currently Referral to podiatry due to number of toenails needing treatment

## 2015-08-29 NOTE — Progress Notes (Signed)
   Subjective:   Bailey Hooper is a 50 y.o. female with a history of Migraines, HTN, asthma, GERD, IBS, anxiety, depression here for same day appointment for ingrown toenail  Ingrown toenails - present in multiple toes for years - worse over last 6 months - thinks she cuts toenails too short - hurt if she stubs them or wears tight shoes - toenails are thick and yellow - no fevers or drainage  Review of Systems:  Per HPI.   Social History: Current smoker  Objective:  BP 167/98 mmHg  Pulse 79  Temp(Src) 98.2 F (36.8 C) (Oral)  Wt 181 lb (82.101 kg)  Gen:  50 y.o. female in NAD HEENT: NCAT, MMM, anicteric sclerae CV: Reg Rate, 2+ DP pulses Resp: Non-labored Ext: WWP, no edema MSK: No obvious deformities Feet: All toenails thick, painted so unable to appreciate color, no redness/swelling/TTP of nail folds, toenails curling and ingrown, no calluses or ulcerations Neuro: Alert and oriented, speech normal     Assessment & Plan:     Bailey Hooper is a 50 y.o. female here for  Ingrown toenail All toenails ingrown and thickened Likely 2/2 onychomycosis No acute infection currently Referral to podiatry due to number of toenails needing treatment     Bailey Crews, MD MPH PGY-2,  [facility] 08/29/2015  3:03 PM

## 2015-09-13 ENCOUNTER — Ambulatory Visit (INDEPENDENT_AMBULATORY_CARE_PROVIDER_SITE_OTHER): Payer: BLUE CROSS/BLUE SHIELD | Admitting: *Deleted

## 2015-09-13 DIAGNOSIS — Z111 Encounter for screening for respiratory tuberculosis: Secondary | ICD-10-CM | POA: Diagnosis not present

## 2015-09-13 NOTE — Progress Notes (Signed)
   PPD placed Left Forearm.  Pt to return 09/15/2015 for reading.  Pt tolerated intradermal injection. Derl Barrow, RN

## 2015-09-14 ENCOUNTER — Ambulatory Visit: Payer: Self-pay | Admitting: Podiatry

## 2015-09-15 ENCOUNTER — Encounter: Payer: Self-pay | Admitting: *Deleted

## 2015-09-15 ENCOUNTER — Ambulatory Visit (INDEPENDENT_AMBULATORY_CARE_PROVIDER_SITE_OTHER): Payer: BLUE CROSS/BLUE SHIELD | Admitting: *Deleted

## 2015-09-15 DIAGNOSIS — Z111 Encounter for screening for respiratory tuberculosis: Secondary | ICD-10-CM

## 2015-09-15 DIAGNOSIS — Z7689 Persons encountering health services in other specified circumstances: Secondary | ICD-10-CM

## 2015-09-15 LAB — TB SKIN TEST
Induration: 0 mm
TB SKIN TEST: NEGATIVE

## 2015-09-15 NOTE — Progress Notes (Signed)
   PPD Reading Note PPD read and results entered in EpicCare. Result: 0 mm induration. Interpretation: Negative If test not read within 48-72 hours of initial placement, patient advised to repeat in other arm 1-3 weeks after this test. Allergic reaction: no  Martin, Tamika L, RN  

## 2015-09-20 ENCOUNTER — Ambulatory Visit (INDEPENDENT_AMBULATORY_CARE_PROVIDER_SITE_OTHER): Payer: BLUE CROSS/BLUE SHIELD | Admitting: Family Medicine

## 2015-09-20 ENCOUNTER — Encounter: Payer: Self-pay | Admitting: Family Medicine

## 2015-09-20 VITALS — BP 165/90 | HR 62 | Temp 98.2°F | Wt 180.0 lb

## 2015-09-20 DIAGNOSIS — R51 Headache: Secondary | ICD-10-CM | POA: Diagnosis not present

## 2015-09-20 DIAGNOSIS — J0140 Acute pansinusitis, unspecified: Secondary | ICD-10-CM | POA: Diagnosis not present

## 2015-09-20 DIAGNOSIS — R519 Headache, unspecified: Secondary | ICD-10-CM

## 2015-09-20 MED ORDER — AMOXICILLIN-POT CLAVULANATE 875-125 MG PO TABS
1.0000 | ORAL_TABLET | Freq: Two times a day (BID) | ORAL | Status: DC
Start: 2015-09-20 — End: 2016-04-24

## 2015-09-20 NOTE — Patient Instructions (Signed)
Augmentin: if symptoms are resolved afer 7 days you can stop taking the medicine. If still having front sinus pain/headache take all 10 days worth.  If the headache is not getting better and you want to consider the shots.

## 2015-09-20 NOTE — Progress Notes (Signed)
   Subjective:    Patient ID: Bailey Hooper, female    DOB: Jul 16, 1965, 50 y.o.   MRN: [id number]  HPI  Patient presents for Same Day Appointment  CC: headaches  # Headaches:  Present since Friday.   She is concerned about cluster headaches -- she does endorse having watery eyes and runny nose with some of her headaches.  Has a history of migraines.   Has tried sumatriptan and excedrin migraine without relief   Today headache is more frontal, having a lot of nasal congestion and discharge. She said she was having cold symptoms/stuffy nose about 1.5 weeks ago and seemed to get better. She had tried to call in a refill on amoxicillin but it wasn't given  Has had some ear pain and ear stopped up  Finished a 7 day course of doxycycline given to her by her dermatologist for skin issue  She says she did not take her BP medicines today because she was feeling a little nauseas. Also under a lot of stress lately.  ROS: sinus congestion, ear pain, no fevers, no chills, no chest pains, no shortness of breath   Social Hx: current smoker  Review of Systems   See HPI for ROS.   Past medical history, surgical, family, and social history reviewed and updated in the EMR as appropriate.  Objective:  BP 165/90 mmHg  Pulse 62  Temp(Src) 98.2 F (36.8 C) (Oral)  Wt 180 lb (81.647 kg)  SpO2 99% Vitals and nursing note reviewed  General: no apparent distress  Eyes: PERRL, EOMI, normal conjunctiva and sclera  ENTM: normal TMs and ear canals bilaterally. Moist mucous membranes. She has tenderness to percussion over all sinuses CV: normal rate, regular rhythm, no murmurs, rubs or gallop  Resp: clear to auscultation bilaterally, normal effort   Assessment & Plan:  1. Acute nonintractable headache, unspecified headache type Possibly related to below. Patient declined migraine cocktail in clinic today. She describes some symptoms of cluster headaches but it is not clear how severe those  symptoms are and if there is a pattern of this; doesn't meet typical demographics though certainly that would not rule it out. Will treat for bacterial sinusitis and have her return if not improving.  2. Acute pansinusitis, recurrence not specified Doxycycline 2 weeks ago, will treat with augmentin.    Return if symptoms worsen or fail to improve.

## 2015-10-03 ENCOUNTER — Ambulatory Visit: Payer: Self-pay | Admitting: Podiatry

## 2015-10-21 ENCOUNTER — Other Ambulatory Visit: Payer: Self-pay | Admitting: Obstetrics & Gynecology

## 2015-10-21 DIAGNOSIS — N6002 Solitary cyst of left breast: Secondary | ICD-10-CM

## 2015-11-03 ENCOUNTER — Ambulatory Visit: Payer: BLUE CROSS/BLUE SHIELD | Admitting: Internal Medicine

## 2015-11-03 ENCOUNTER — Other Ambulatory Visit: Payer: Self-pay | Admitting: Obstetrics & Gynecology

## 2015-11-03 DIAGNOSIS — N6002 Solitary cyst of left breast: Secondary | ICD-10-CM

## 2015-11-10 ENCOUNTER — Other Ambulatory Visit: Payer: Self-pay | Admitting: Obstetrics & Gynecology

## 2015-11-10 DIAGNOSIS — N6001 Solitary cyst of right breast: Secondary | ICD-10-CM

## 2016-04-17 ENCOUNTER — Other Ambulatory Visit: Payer: Self-pay | Admitting: Internal Medicine

## 2016-04-17 DIAGNOSIS — I10 Essential (primary) hypertension: Secondary | ICD-10-CM

## 2016-04-17 DIAGNOSIS — G4489 Other headache syndrome: Secondary | ICD-10-CM

## 2016-04-20 ENCOUNTER — Other Ambulatory Visit: Payer: Self-pay | Admitting: Internal Medicine

## 2016-04-20 DIAGNOSIS — Z1231 Encounter for screening mammogram for malignant neoplasm of breast: Secondary | ICD-10-CM

## 2016-04-23 ENCOUNTER — Telehealth: Payer: Self-pay | Admitting: Internal Medicine

## 2016-04-23 NOTE — Telephone Encounter (Signed)
Pt scheduled appointment for tomorrow, pt has cold symptoms and has been trying things over the counter, no relief. Pt would like a nurse to call her to suggest something for pt to try, pt states she needs relief today. ep

## 2016-04-23 NOTE — Telephone Encounter (Signed)
Return call to patient regarding cold symptoms.  Patient has tried several over the counter medications and home treatments with minimal relief.  Patient reported having fever 101 Friday and Saturday.  She has tried Tylenol Head and Cold, Coricidin HBP, Mucinex, honey/lemon tea and cough drops, Ibuprofen for pain/fever/discomfort.  Patient has dry cough, runny nose, stuffiness, congestion.  Advised patient to try normal saline.  Patient has Ipratropium nasal spray at home.  Advised patient to use the nasal spray and keep appointment for tomorrow. Will forward to PCP and Dr. Gwendlyn Deutscher.  Derl Barrow, RN

## 2016-04-24 ENCOUNTER — Ambulatory Visit: Payer: BLUE CROSS/BLUE SHIELD | Admitting: Internal Medicine

## 2016-04-24 ENCOUNTER — Encounter: Payer: Self-pay | Admitting: Internal Medicine

## 2016-04-24 ENCOUNTER — Telehealth: Payer: Self-pay | Admitting: Student

## 2016-04-24 ENCOUNTER — Ambulatory Visit (INDEPENDENT_AMBULATORY_CARE_PROVIDER_SITE_OTHER): Payer: BLUE CROSS/BLUE SHIELD | Admitting: Internal Medicine

## 2016-04-24 VITALS — BP 140/90 | HR 98 | Temp 98.1°F | Ht 66.0 in | Wt 177.2 lb

## 2016-04-24 DIAGNOSIS — B9689 Other specified bacterial agents as the cause of diseases classified elsewhere: Secondary | ICD-10-CM

## 2016-04-24 DIAGNOSIS — J019 Acute sinusitis, unspecified: Secondary | ICD-10-CM

## 2016-04-24 MED ORDER — AMOXICILLIN-POT CLAVULANATE 875-125 MG PO TABS: 1.0000 | ORAL_TABLET | Freq: Two times a day (BID) | ORAL | 0 refills | Status: DC

## 2016-04-24 MED ORDER — CLONAZEPAM 0.5 MG PO TABS: 0.5000 mg | ORAL_TABLET | Freq: Three times a day (TID) | ORAL | 0 refills | Status: DC | PRN

## 2016-04-24 NOTE — Assessment & Plan Note (Signed)
Pt with facial pain, fevers, thick yellow nasal drainage, and cough for 1 week. She has tried multiple different over-the-counter medications without any relief. She has had many sinus infections in the past. Tobacco use likely contributing, although patient has not smoked at all for the last 3 days. - Will treat with Augmentin x 7 days - Pt requesting referral to ENT to discuss having her tonsils removed. Referral placed. - Return precautions discussed - Follow-up if not improving

## 2016-04-24 NOTE — Telephone Encounter (Signed)
Called patient response to after our page I received. Patient reports stomach pain after taking Augmentin. She tried famotidine without improvement. I also recommended trying Tums or Pepto-Bismol or milk. I recommended taking his next dose of Augmentin with food. Patient voiced understanding and she is appreciative about the call back

## 2016-04-24 NOTE — Progress Notes (Signed)
   [facility] Phone: [phone]  Subjective:  Bailey Hooper is a 51 year old female presenting to clinic for a same day appointment with a cold. She has had dry cough, runny nose, stuffiness, and congestion for 1 week. She is having thick yellow nasal drainage and a lot of facial pain. She has also had fevers to 101F on Friday and Saturday. She has tried multiple OTC medications with minimal relief, including Tylenol head and cold, Ibuprofen, Coricidin HBP, Mucinex, honey/lemon tea, cough drops, Ipratropium nasal spray, and nasal saline. She has had sinus infections many times in the past.  ROS: See HPI for pertinent positives and negatives  Past Medical History- HTN, IBS, anxiety, depression, tobacco use  Family history reviewed for today's visit. No changes.  Social history- patient is a current smoker  Objective: BP 140/90 (BP Location: Left Arm, Patient Position: Sitting, Cuff Size: Normal)   Pulse 98   Ht 5\' 6"  (1.676 m)   Wt 177 lb 3.2 oz (80.4 kg)   SpO2 98%   BMI 28.60 kg/m  Gen: NAD, alert, cooperative with exam HEENT: NCAT, EOMI, TMs clear, nasal turbinates erythematous and edematous with thick yellow discharge present, oropharynx erythematous Neck: FROM, supple, no cervical lymphadenopathy Resp: CTAB, normal work of breathing, no crackles or wheezes  Assessment/Plan: Acute Bacterial Sinusitis: Pt with facial pain, fevers, thick yellow nasal drainage, and cough for 1 week. She has tried multiple different over-the-counter medications without any relief. She has had many sinus infections in the past. Tobacco use likely contributing, although patient has not smoked at all for the last 3 days. - Will treat with Augmentin x 7 days - Pt requesting referral to ENT to discuss having her tonsils removed. Referral placed. - Return precautions discussed - Follow-up if not improving   Hyman Bible, MD PGY-2

## 2016-04-24 NOTE — Patient Instructions (Signed)
It was so nice to see you!  I have prescribed an antibiotic called Augmentin. Please take this twice a day for 7 days. Let us know if you are not feeling better.  -Dr. Brett Albino

## 2016-04-27 ENCOUNTER — Telehealth: Payer: Self-pay | Admitting: *Deleted

## 2016-04-27 ENCOUNTER — Ambulatory Visit (INDEPENDENT_AMBULATORY_CARE_PROVIDER_SITE_OTHER): Payer: BLUE CROSS/BLUE SHIELD | Admitting: Family Medicine

## 2016-04-27 ENCOUNTER — Encounter: Payer: Self-pay | Admitting: Family Medicine

## 2016-04-27 VITALS — BP 142/82 | HR 65 | Temp 97.9°F | Ht 66.0 in | Wt 180.8 lb

## 2016-04-27 DIAGNOSIS — E559 Vitamin D deficiency, unspecified: Secondary | ICD-10-CM | POA: Diagnosis not present

## 2016-04-27 DIAGNOSIS — R69 Illness, unspecified: Secondary | ICD-10-CM | POA: Diagnosis not present

## 2016-04-27 DIAGNOSIS — M549 Dorsalgia, unspecified: Secondary | ICD-10-CM

## 2016-04-27 DIAGNOSIS — R739 Hyperglycemia, unspecified: Secondary | ICD-10-CM | POA: Diagnosis not present

## 2016-04-27 DIAGNOSIS — F17200 Nicotine dependence, unspecified, uncomplicated: Secondary | ICD-10-CM

## 2016-04-27 LAB — POCT URINALYSIS DIPSTICK
Bilirubin, UA: NEGATIVE
GLUCOSE UA: NEGATIVE
Ketones, UA: NEGATIVE
Leukocytes, UA: NEGATIVE
NITRITE UA: NEGATIVE
PH UA: 7.5
PROTEIN UA: NEGATIVE
SPEC GRAV UA: 1.015
UROBILINOGEN UA: 0.2

## 2016-04-27 LAB — POCT UA - MICROSCOPIC ONLY

## 2016-04-27 MED ORDER — FLUCONAZOLE 150 MG PO TABS: 150.0000 mg | ORAL_TABLET | Freq: Every day | ORAL | 0 refills | Status: DC

## 2016-04-27 MED ORDER — TAMSULOSIN HCL 0.4 MG PO CAPS: 0.4000 mg | ORAL_CAPSULE | Freq: Every day | ORAL | 3 refills | Status: DC

## 2016-04-27 MED ORDER — FAMOTIDINE 20 MG PO TABS: 20.0000 mg | ORAL_TABLET | Freq: Two times a day (BID) | ORAL | 2 refills | Status: DC | PRN

## 2016-04-27 MED ORDER — KETOROLAC TROMETHAMINE 30 MG/ML IJ SOLN
30.0000 mg | Freq: Once | INTRAMUSCULAR | Status: AC
  Administered 2016-04-27: 30 mg via INTRAMUSCULAR

## 2016-04-27 MED ORDER — ATORVASTATIN CALCIUM 40 MG PO TABS: 40.0000 mg | ORAL_TABLET | Freq: Every evening | ORAL | 1 refills | Status: DC

## 2016-04-27 MED ORDER — METHYLPREDNISOLONE ACETATE 40 MG/ML IJ SUSP
40.0000 mg | Freq: Once | INTRAMUSCULAR | Status: AC
  Administered 2016-04-27: 40 mg via INTRAMUSCULAR

## 2016-04-27 MED ORDER — TRAMADOL HCL 50 MG PO TABS: 50.0000 mg | ORAL_TABLET | Freq: Four times a day (QID) | ORAL | 0 refills | Status: DC | PRN

## 2016-04-27 MED ORDER — NAPROXEN 500 MG PO TABS: 500.0000 mg | ORAL_TABLET | Freq: Two times a day (BID) | ORAL | 0 refills | Status: DC

## 2016-04-27 NOTE — Progress Notes (Signed)
Subjective:     Patient ID: Bailey Hooper, female   DOB: 02-01-1966, 51 y.o.   MRN: [id number]  HPI Bailey Hooper is a 51yo female presenting today for left flank pain. Notes history of IBS (mixed), but this does not feel like her normal IBS flare. Reports symptoms of gassiness, bloating, nausea, and belching as well as a constant left sided flank pain. Pain makes her unable to get comfortable. Denies fever, abdominal pain, vomiting, diarrhea, and constipation. Has had normal bowel movements, with three bowel movements yesterday and one so far today. Notes similar episode one month ago which resolved on its own, but this seems worse. Pain is worse with movement. Denies dysuria, frequency, urgency, hematuria. Requests transition from Protonix to Famotidine, which seems to be helping more.   Medications reviewed and updated in Epic. Note most medications are on as needed basis, but patient does not want them removed from her list. No longer taking statin. Notes history of pre-diabetes and would like this checked today. Also with history of Vitamin D deficiency, last checked in 03/2015, still taking Vitamin D supplementation. Continues to smoke, but is interested in quitting. Has Nicotine gum prescribed, but she has not picked this up yet.   Review of Systems Per HPI    Objective:   Physical Exam  Constitutional: She appears well-developed and well-nourished. No distress.  HENT:  Head: Normocephalic.  Cardiovascular: Normal rate and regular rhythm.   No murmur heard. Pulmonary/Chest: Breath sounds normal. No respiratory distress. She has no wheezes.  Abdominal: Soft. She exhibits no distension and no mass. There is no tenderness.  Negative Rebound. Negative Murphy's. Negative Rovsing. No CVA tenderness noted.  Musculoskeletal:  Tenderness over left lumbar paraspinal muscles. No midline tenderness. Negative straight leg.       Assessment and Plan:     1. Left-sided back pain, unspecified back  location, unspecified chronicity Differential of Renal Stone vs. Musculoskeletal Back Pain. Urinalysis with small blood, 0-3RBC. CT renal stone study ordered to aid in differential. Kirtland Bouchard of Depo and Toradol given. Tramadol refilled. Naproxen ordered. History of IBS noted, encouraged to take Bentyl as prescribed. Protonix discontinued, Famotidine prescribed.   2. Vitamin D deficiency Not checked since 03/2015. Lab closed, so future lab ordered to check Vitamin D level  3. Taking multiple medications for chronic disease Future lab ordered to check CMP  4. Hyperglycemia History of Pre-Diabetes. Future lab ordered for A1C  5. Tobacco Use Disorder Counseled concerning cessation. Encouraged to pick up Nicotine Gum.   6. Health Maintenance: Future lab order for Lipid Panel and CMP. Atorvastatin refilled. Also notes she usually gets yeast infection with antibiotic and she is currently on course of Augmentin for sinusitis. Prescription for Diflucan sent to pharmacy.

## 2016-04-27 NOTE — Patient Instructions (Addendum)
Thank you so much for coming to visit today! Your urine showed signs of blood, which could be caused by a renal stone. Please drink plenty of fluids. I will give you a prescription for Tramadol and you will receive a shot of pain medication and steroids here today. I will contact you with the results of the microscopy portion of the urinalysis and order a CT renal stone study and send a prescription for Flomax to the pharmacy if appropriate. Please take your Bentyl as prescribed. If pain worsens over the weekend, you may go to the ED for further evaluation.  I have refilled your Atorvastatin and Diflucan and sent a prescription for Famotidine to the pharmacy.  Please schedule a lab visit to return to have your Vitamin D, CMP, Lipid Panel, and A1C drawn. You will also need a repeat urinalysis to make sure the blood is resolved--if it does not resolve, you may need a referral to Urology.  Please continue to work on smoking cessation.  Dr. Gerlean Ren

## 2016-04-27 NOTE — Telephone Encounter (Signed)
Pt called and said she was expecting a call from Dr. Gerlean Ren about her results and I told her that I would send her a message also pt has appointment on Monday @ [place] for her CT @ 4:30pm.  Contacted pt and let her know and gave her the scheduling number just in case there is any conflict with that time. Routing to Dr. Gerlean Ren.  Katharina Caper, Amour Trigg D, [place]

## 2016-04-30 ENCOUNTER — Ambulatory Visit (HOSPITAL_COMMUNITY): Admission: RE | Admit: 2016-04-30 | Payer: BLUE CROSS/BLUE SHIELD | Source: Ambulatory Visit

## 2016-04-30 ENCOUNTER — Ambulatory Visit (HOSPITAL_COMMUNITY): Payer: BLUE CROSS/BLUE SHIELD

## 2016-05-28 ENCOUNTER — Encounter: Payer: Self-pay | Admitting: Gastroenterology

## 2016-06-06 ENCOUNTER — Encounter: Payer: BLUE CROSS/BLUE SHIELD | Admitting: Obstetrics and Gynecology

## 2016-06-11 ENCOUNTER — Ambulatory Visit (INDEPENDENT_AMBULATORY_CARE_PROVIDER_SITE_OTHER): Payer: BLUE CROSS/BLUE SHIELD | Admitting: Obstetrics and Gynecology

## 2016-06-11 VITALS — BP 130/80 | HR 75 | Temp 98.2°F | Ht 66.0 in | Wt 184.0 lb

## 2016-06-11 DIAGNOSIS — E559 Vitamin D deficiency, unspecified: Secondary | ICD-10-CM

## 2016-06-11 DIAGNOSIS — R739 Hyperglycemia, unspecified: Secondary | ICD-10-CM

## 2016-06-11 DIAGNOSIS — J309 Allergic rhinitis, unspecified: Secondary | ICD-10-CM

## 2016-06-11 DIAGNOSIS — R69 Illness, unspecified: Secondary | ICD-10-CM | POA: Diagnosis not present

## 2016-06-11 DIAGNOSIS — I10 Essential (primary) hypertension: Secondary | ICD-10-CM

## 2016-06-11 DIAGNOSIS — Z716 Tobacco abuse counseling: Secondary | ICD-10-CM

## 2016-06-11 DIAGNOSIS — K219 Gastro-esophageal reflux disease without esophagitis: Secondary | ICD-10-CM

## 2016-06-11 DIAGNOSIS — Z0000 Encounter for general adult medical examination without abnormal findings: Secondary | ICD-10-CM

## 2016-06-11 LAB — COMPLETE METABOLIC PANEL WITH GFR
ALT: 17 U/L (ref 6–29)
AST: 12 U/L (ref 10–35)
Albumin: 4.1 g/dL (ref 3.6–5.1)
Alkaline Phosphatase: 65 U/L (ref 33–130)
BUN: 12 mg/dL (ref 7–25)
CALCIUM: 9.1 mg/dL (ref 8.6–10.4)
CO2: 30 mmol/L (ref 20–31)
CREATININE: 0.71 mg/dL (ref 0.50–1.05)
Chloride: 104 mmol/L (ref 98–110)
Glucose, Bld: 119 mg/dL — ABNORMAL HIGH (ref 65–99)
POTASSIUM: 4.3 mmol/L (ref 3.5–5.3)
Sodium: 141 mmol/L (ref 135–146)
Total Bilirubin: 0.2 mg/dL (ref 0.2–1.2)
Total Protein: 6.8 g/dL (ref 6.1–8.1)

## 2016-06-11 LAB — LIPID PANEL
Cholesterol: 151 mg/dL (ref <200)
HDL: 46 mg/dL — AB (ref >50)
LDL Cholesterol: 90 mg/dL (ref <100)
TRIGLYCERIDES: 76 mg/dL (ref <150)
Total CHOL/HDL Ratio: 3.3 Ratio (ref <5.0)
VLDL: 15 mg/dL (ref <30)

## 2016-06-11 LAB — POCT GLYCOSYLATED HEMOGLOBIN (HGB A1C): HEMOGLOBIN A1C: 5.9

## 2016-06-11 MED ORDER — OMEPRAZOLE 40 MG PO CPDR: 40.0000 mg | DELAYED_RELEASE_CAPSULE | Freq: Every day | ORAL | 0 refills | Status: DC

## 2016-06-11 MED ORDER — AZELASTINE HCL 0.05 % OP SOLN: 1.0000 [drp] | Freq: Two times a day (BID) | OPHTHALMIC | 12 refills | Status: DC

## 2016-06-11 NOTE — Patient Instructions (Addendum)
Things to do to Keep yourself Healthy - Exercise at least 30-45 minutes a day,  3-4 days a week.  - Eat a low-fat diet with lots of fruits and vegetables, up to 7-9 servings per day. - Seatbelts can save your life. Wear them always. - Smoke detectors on every level of your home, check batteries every year. - Eye Doctor - have an eye exam every 1-2 years - Safe sex - if you may be exposed to STDs, use a condom. - Alcohol If you drink, do it moderately,less than 2 drinks per day. - [place].  Choose someone to speak for you if you are not able. - Depression is common in our stressful world.If you're feeling down or losing interest in things you normally enjoy, please come in for a visit. - Violence - If anyone is threatening or hurting you, please call immediately.  [facility] Your primary care provider may refer you to a [facility] for a 15-30 minute visit. The [facility] will focus on a particular problem.  After talking to you, the [facility] will help you make any changes you want to make centered around your health. [facility] can help you with: .             Difficult life problems .             Stress, depression or anxiety .             Coping with medical problems .             Reflect on harmful habits (alcohol, tobacco and drugs),  .             Learning relaxation skills  .             Sleep difficulties  .             Mental health concerns  Call [phone] to schedule an appointment  Buspirone tablets What is this medicine? BUSPIRONE (byoo SPYE rone) is used to treat anxiety disorders. This medicine may be used for other purposes; ask your health care provider or pharmacist if you have questions. COMMON BRAND NAME(S): BuSpar What should I tell my health care provider before I take this medicine? They need to know if you have any of these conditions: -kidney or liver disease -an unusual or allergic  reaction to buspirone, other medicines, foods, dyes, or preservatives -pregnant or trying to get pregnant -breast-feeding How should I use this medicine? Take this medicine by mouth with a glass of water. Follow the directions on the prescription label. You may take this medicine with or without food. To ensure that this medicine always works the same way for you, you should take it either always with or always without food. Take your doses at regular intervals. Do not take your medicine more often than directed. Do not stop taking except on the advice of your doctor or health care professional. Talk to your pediatrician regarding the use of this medicine in children. Special care may be needed. Overdosage: If you think you have taken too much of this medicine contact a poison control center or emergency room at once. NOTE: This medicine is only for you. Do not share this medicine with others. What if I miss a dose? If you miss a dose, take it as soon as you can. If it is almost time for your next dose, take only that dose. Do  not take double or extra doses. What may interact with this medicine? Do not take this medicine with any of the following medications: -linezolid -MAOIs like Carbex, Eldepryl, Marplan, Nardil, and Parnate -methylene blue -procarbazine This medicine may also interact with the following medications: -diazepam -digoxin -diltiazem -erythromycin -grapefruit juice -haloperidol -medicines for mental depression or mood problems -medicines for seizures like carbamazepine, phenobarbital and phenytoin -nefazodone -other medications for anxiety -rifampin -ritonavir -some antifungal medicines like itraconazole, ketoconazole, and voriconazole -verapamil -warfarin This list may not describe all possible interactions. Give your health care provider a list of all the medicines, herbs, non-prescription drugs, or dietary supplements you use. Also tell them if you smoke, drink  alcohol, or use illegal drugs. Some items may interact with your medicine. What should I watch for while using this medicine? Visit your doctor or health care professional for regular checks on your progress. It may take 1 to 2 weeks before your anxiety gets better. You may get drowsy or dizzy. Do not drive, use machinery, or do anything that needs mental alertness until you know how this drug affects you. Do not stand or sit up quickly, especially if you are an older patient. This reduces the risk of dizzy or fainting spells. Alcohol can make you more drowsy and dizzy. Avoid alcoholic drinks. What side effects may I notice from receiving this medicine? Side effects that you should report to your doctor or health care professional as soon as possible: -blurred vision or other vision changes -chest pain -confusion -difficulty breathing -feelings of hostility or anger -muscle aches and pains -numbness or tingling in hands or feet -ringing in the ears -skin rash and itching -vomiting -weakness Side effects that usually do not require medical attention (report to your doctor or health care professional if they continue or are bothersome): -disturbed dreams, nightmares -headache -nausea -restlessness or nervousness -sore throat and nasal congestion -stomach upset This list may not describe all possible side effects. Call your doctor for medical advice about side effects. You may report side effects to FDA at 1-800-FDA-1088. Where should I keep my medicine? Keep out of the reach of children. Store at room temperature below 30 degrees C (86 degrees F). Protect from light. Keep container tightly closed. Throw away any unused medicine after the expiration date. NOTE: This sheet is a summary. It may not cover all possible information. If you have questions about this medicine, talk to your doctor, pharmacist, or health care provider.  2018 Elsevier/Gold Standard (2009-11-03 18:06:11)

## 2016-06-11 NOTE — Progress Notes (Signed)
Subjective: Chief Complaint  Patient presents with  . Annual Exam  . sinus congestion     HPI: Bailey Hooper is a 51 y.o. female presents for well woman/preventative visit.  Acute Concerns: 1. Nasal congestion - has chronic sinus issues. Following with ENT,. Has appointment scheduled. No fevers. 2. GERD - having increased symptoms that are  present daily. Heartburn and reflux worse. Pepcid not working.   Seeing multiple specialist - urologist for hematuria, GI for colonoscopy and hiatal hernia, podiatry for calluses and onychomycosis.  Diet: Cut back on fried foods, eating vegatbles more, less meat; cutting back on sodas  [place] [facility] [place]  Sexual History: Sexually active    Birth history: G1P1  LMP: No LMP recorded. Patient has had an implant.  Birth Control: Nexplanon  Social:  Social History   Social History  . Marital status: Divorced    Spouse name: N/A  . Number of children: N/A  . Years of education: N/A   Social History Main Topics  . Smoking status: Current Every Day Smoker    Packs/day: 0.25    Years: 29.00    Types: Cigarettes    Start date: 04/09/1986  . Smokeless tobacco: Never Used     Comment: Previous 0.5 PPD. trying to quitt. Does not smoke when sick  . Alcohol use No  . Drug use: No  . Sexual activity: Yes    Birth control/ protection: Implant   Other Topics Concern  . 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 COLONOSCOPY  02/17/2016    ROS reviewed and were negative unless otherwise noted in HPI.   Past Medical, Surgical, Social, and Family History Reviewed & Updated per EMR.    Objective: BP 130/80   Pulse 75   Temp 98.2 F (36.8 C) (Oral)   Ht 5\' 6"  (1.676 m)   Wt 184 lb (83.5 kg)   SpO2 99%   BMI 29.70 kg/m  Vitals and nursing  notes reviewed  Physical Exam  Constitutional: She is well-developed, well-nourished, and in no distress.  HENT:  Head: Normocephalic and atraumatic.  Mouth/Throat: Oropharynx is clear and moist.  Eyes: Conjunctivae and EOM are normal. Pupils are equal, round, and reactive to light.  Neck: Normal range of motion. Neck supple. No thyromegaly present.  Cardiovascular: Normal rate and regular rhythm.   Murmur heard. Pulmonary/Chest: Effort normal and breath sounds normal.  Abdominal: Soft. Bowel sounds are normal. She exhibits no distension. There is no tenderness. There is no guarding.  Musculoskeletal: Normal range of motion. She exhibits no edema or tenderness.  Lymphadenopathy:    She has no cervical adenopathy.  Neurological: She is alert. No cranial nerve deficit. Gait normal.  Skin: Skin is warm and dry.  Psychiatric: Mood and affect normal.   Assessment/Plan: 51 y.o. female presents for annual well woman/preventative exam. Please see problem specific assessment and plan.   PATIENT EDUCATION PROVIDED: See AVS    Diagnosis and plan along with any newly prescribed medication(s) were discussed in detail with this patient today. The patient verbalized understanding and agreed with the plan. Patient advised if symptoms worsen return to clinic or ER.    Meds ordered this encounter  Medications  .  omeprazole (PRILOSEC) 40 MG capsule    Sig: Take 1 capsule (40 mg total) by mouth daily.    Dispense:  30 capsule    Refill:  0  . azelastine (OPTIVAR) 0.05 % ophthalmic solution    Sig: Place 1 drop into both eyes 2 (two) times daily.    Dispense:  6 mL    Refill:  [id number], DO 06/11/2016, 4:01 PM PGY-3, [place]

## 2016-06-12 LAB — VITAMIN D 25 HYDROXY (VIT D DEFICIENCY, FRACTURES): VIT D 25 HYDROXY: 25 ng/mL — AB (ref 30–100)

## 2016-06-12 NOTE — Assessment & Plan Note (Signed)
Will recheck today since has not been checked in over a year. Continues to take Vitamin D.

## 2016-06-12 NOTE — Assessment & Plan Note (Signed)
Continues to have symptomatic reflux. Not controlled with Pepcid. H/o hiatal hernia that may be worsening GERD. Rx for omeprazole to see if this controls symptoms better. To follow-up with her GI doctor within the month.

## 2016-06-12 NOTE — Assessment & Plan Note (Signed)
Up to date on most health maintenance topics. Colonoscopy scheduled soon. Routine blood work collected.

## 2016-06-12 NOTE — Assessment & Plan Note (Signed)
Continues to smoke. Not ready to quit. Is a good candidate for successful cessation due to minimal cigarette use and motivation. Will follow-up with PCP to discuss options for nicotine replacement. Tobacco cessation counseling given.

## 2016-06-12 NOTE — Assessment & Plan Note (Signed)
BP at goal today. Continue medication regimen. No changes. Will obtain blood work.

## 2016-06-12 NOTE — Assessment & Plan Note (Signed)
Follows with ENT. Refill of allergy eyedrops given. Continue other allergy medications.

## 2016-06-25 ENCOUNTER — Encounter: Payer: Self-pay | Admitting: Obstetrics and Gynecology

## 2016-07-03 ENCOUNTER — Ambulatory Visit: Payer: BLUE CROSS/BLUE SHIELD | Admitting: Gastroenterology

## 2016-07-04 ENCOUNTER — Encounter: Payer: Self-pay | Admitting: Gastroenterology

## 2016-07-04 ENCOUNTER — Ambulatory Visit (INDEPENDENT_AMBULATORY_CARE_PROVIDER_SITE_OTHER): Payer: BLUE CROSS/BLUE SHIELD | Admitting: Gastroenterology

## 2016-07-04 VITALS — BP 136/80 | Ht 66.0 in | Wt 183.4 lb

## 2016-07-04 DIAGNOSIS — R140 Abdominal distension (gaseous): Secondary | ICD-10-CM

## 2016-07-04 DIAGNOSIS — K582 Mixed irritable bowel syndrome: Secondary | ICD-10-CM | POA: Diagnosis not present

## 2016-07-04 DIAGNOSIS — R1310 Dysphagia, unspecified: Secondary | ICD-10-CM | POA: Diagnosis not present

## 2016-07-04 DIAGNOSIS — Z800 Family history of malignant neoplasm of digestive organs: Secondary | ICD-10-CM

## 2016-07-04 DIAGNOSIS — K219 Gastro-esophageal reflux disease without esophagitis: Secondary | ICD-10-CM

## 2016-07-04 DIAGNOSIS — R110 Nausea: Secondary | ICD-10-CM

## 2016-07-04 MED ORDER — NA SULFATE-K SULFATE-MG SULF 17.5-3.13-1.6 GM/177ML PO SOLN: 1.0000 | Freq: Once | ORAL | 0 refills | Status: AC

## 2016-07-04 NOTE — Progress Notes (Signed)
Bailey Hooper    [id number]    1966-02-26  Primary Care Physician:Katy D Mayo, MD  Referring Physician: Sela Hua, MD [place], [place] [address]  Chief complaint:  Nausea, bloating, dysphagia, GERD, Constipation alternating with diarrhea  HPI: 9 yr F followed in remote past by Dr Sharlett Iles >15 years ago is here to re establish care. She has h/o chronic intermittent nausea, almost daily basis. She had upper GI series in 2002 that showed pylorus spasm, duodenal spasm, no definitive ulcer. She c/o constant belching, acid taste, nausea and intermittent dysphagia mostly to solids. She feels food gets stuck back of her throat. She had egd and colonoscopy many years ago >15 years per patient. Father diagnosed with colon cancer in early 61's. She also c/o alternating constipation and diarrhea with bloating and excessive gas. She was diagnosed with IBS in the past.  Denies any vomiting, melena or blood per rectum.    Outpatient Encounter Prescriptions as of 07/04/2016  Medication Sig  . atorvastatin (LIPITOR) 40 MG tablet Take 1 tablet (40 mg total) by mouth every evening.  Marland Kitchen azelastine (OPTIVAR) 0.05 % ophthalmic solution Place 1 drop into both eyes 2 (two) times daily.  . chlorhexidine (HIBICLENS) 4 % external liquid Apply topically daily as needed.  . clonazePAM (KLONOPIN) 0.5 MG tablet Take 1 tablet (0.5 mg total) by mouth 3 (three) times daily as needed. for anxiety  . Dentifrices (BIOTENE DRY MOUTH CARE DT) Place onto teeth as needed.  . dicyclomine (BENTYL) 20 MG tablet Take 20 mg by mouth every 6 (six) hours.  Marland Kitchen doxycycline (ADOXA) 100 MG tablet Take 100 mg by mouth 2 (two) times daily.  Marland Kitchen etonogestrel (IMPLANON) 68 MG IMPL implant Inject 1 each into the skin once.  Marland Kitchen ipratropium (ATROVENT) 0.03 % nasal spray Place 2 sprays into both nostrils every 12 (twelve) hours.  . Melatonin 1 MG CAPS Take by mouth.  . montelukast (SINGULAIR) 10 MG tablet Take 1 tablet  (10 mg total) by mouth at bedtime.  . Multiple Vitamin (MULTIVITAMIN) capsule Take 1 capsule by mouth daily.  . naproxen (NAPROSYN) 500 MG tablet Take 1 tablet (500 mg total) by mouth 2 (two) times daily with a meal.  . nystatin (MYCOSTATIN/NYSTOP) 100000 UNIT/GM POWD Apply to irritated vulvar area (not mucous membranes) twice daily for 1 week  . omeprazole (PRILOSEC) 40 MG capsule Take 1 capsule (40 mg total) by mouth daily.  . promethazine (PHENERGAN) 12.5 MG tablet Take 1 tablet (12.5 mg total) by mouth every 8 (eight) hours as needed for nausea or vomiting.  . propranolol (INDERAL) 40 MG tablet TAKE 1 TABLET BY MOUTH TWICE DAILY  . tamsulosin (FLOMAX) 0.4 MG CAPS capsule Take 1 capsule (0.4 mg total) by mouth daily.  . traMADol (ULTRAM) 50 MG tablet Take 1 tablet (50 mg total) by mouth every 6 (six) hours as needed.  . [DISCONTINUED] nicotine polacrilex (NICORETTE) 2 MG gum Take 1 each (2 mg total) by mouth as needed for smoking cessation.  . [DISCONTINUED] SUMAtriptan (IMITREX) 100 MG tablet Take 1 tablet (100 mg total) by mouth once.  Marland Kitchen albuterol (PROVENTIL HFA;VENTOLIN HFA) 108 (90 BASE) MCG/ACT inhaler Inhale 2 puffs into the lungs every 6 (six) hours as needed for wheezing.   Marland Kitchen amLODipine (NORVASC) 5 MG tablet TAKE 2 TABLETS BY MOUTH EVERY DAY  . amoxicillin-clavulanate (AUGMENTIN) 875-125 MG tablet Take 1 tablet by mouth 2 (two) times daily. (Patient  not taking: Reported on 06/11/2016)  . clotrimazole (LOTRIMIN) 1 % cream Apply 1 application topically 2 (two) times daily as needed. For rash. (Patient not taking: Reported on 07/04/2016)  . DULoxetine (CYMBALTA) 30 MG capsule Take 1 capsule (30mg ) daily for 1 week, then take 2 capsules daily.  . fluconazole (DIFLUCAN) 150 MG tablet Take 1 tablet (150 mg total) by mouth daily. (Patient not taking: Reported on 07/04/2016)  . hydrocortisone 2.5 % cream Apply topically 2 (two) times daily. (Patient not taking: Reported on 04/27/2016)  . traZODone  (DESYREL) 50 MG tablet Take 50-100 mg by mouth at bedtime as needed for sleep.  . [DISCONTINUED] cephALEXin (KEFLEX) 500 MG capsule Take 1 capsule (500 mg total) by mouth 3 (three) times daily. (Patient not taking: Reported on 04/27/2016)   No facility-administered encounter medications on file as of 07/04/2016.     Allergies as of 07/04/2016 - Review Complete 06/12/2016  Allergen Reaction Noted  . Lisinopril Palpitations and Cough 10/05/2013  . Penicillins Rash 01/17/2009  . Erythromycin Nausea And Vomiting 08/16/2012    Past Medical History:  Diagnosis Date  . Anxiety   . Asthma   . Depression   . GERD (gastroesophageal reflux disease)   . High cholesterol   . Hypertension   . IBS (irritable bowel syndrome)   . Migraines   . Sleep apnea     Past Surgical History:  Procedure Laterality Date  . CESAREAN SECTION    . FOOT SURGERY     Left foot    Family History  Problem Relation Age of Onset  . Colon cancer      Father  . Cancer      Oral, uncle  . Alcohol abuse      Father  . Drug abuse      Father  . Osteoporosis    . Depression    . Hypertension    . Diabetes    . Heart disease      Grandmother    Social History   Social History  . Marital status: Divorced    Spouse name: N/A  . Number of children: N/A  . Years of education: N/A   Occupational History  . Not on file.   Social History Main Topics  . Smoking status: Current Every Day Smoker    Packs/day: 0.25    Years: 29.00    Types: Cigarettes    Start date: 04/09/1986  . Smokeless tobacco: Never Used     Comment: Previous 0.5 PPD. trying to quitt. Does not smoke when sick  . Alcohol use No  . Drug use: No  . Sexual activity: Yes    Birth control/ protection: Implant   Other Topics Concern  . 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 Positive for fatigue HENT: Positive for sinus problems   Eyes: Negative for blurred vision.  Respiratory: Negative for cough, shortness of breath and wheezing.   Cardiovascular: Negative for chest pain and palpitations.  Gastrointestinal: as per HPI Genitourinary: Negative for dysuria, urgency, frequency and hematuria.  Musculoskeletal: Negative for myalgias, back pain and joint pain.  Skin: Negative for itching and rash.  Neurological: Negative for dizziness, tremors, focal weakness, seizures and loss of  consciousness. Positive for headaches. Endo/Heme/Allergies: Positive for seasonal allergies.  Psychiatric/Behavioral: Negative for depression, suicidal ideas and hallucinations.  All other systems reviewed and are negative.   Physical Exam: There were no vitals filed for this visit. Body mass index is 29.6 kg/m. Gen:      No acute distress HEENT:  EOMI, sclera anicteric Neck:     No masses; no thyromegaly Lungs:    Clear to auscultation bilaterally; normal respiratory effort CV:         Regular rate and rhythm; no murmurs Abd:      + bowel sounds; soft, non-tender; no palpable masses, no distension Ext:    No edema; adequate peripheral perfusion Skin:      Warm and dry; no rash Neuro: alert and oriented x 3 Psych: normal mood and affect  Data Reviewed:  Reviewed labs, radiology imaging, old records and pertinent past GI work up   Assessment and Plan/Recommendations: 68 yr F with family history of colon cancer (Father 16's), IBS, chronic GERD here with c/o dysphagia, nausea and alternating constipation with diarrhea  GERD and Nausea: Continue Omeprazole 40 mg daily, 30 mins before breakfast Dyspepsia: FD Gard 1-2 capsules TID as needed  Alternating constipation and diarrhea IBS, Bloating and excessive flatus Advised patient to avoid lactose and excessive fiber IB Gard TID as needed  Dysphagia: Will schedule for  EGD for evaluation and possible esophageal dilation if needed  Colorectal cancer screening: increased risk due to positive family history Schedule for colonoscopy The risks and benefits as well as alternatives of endoscopic procedure(s) have been discussed and reviewed. All questions answered. The patient agrees to proceed.   Damaris Hippo , MD [phone] Mon-Fri 8a-5p [phone] after 5p, weekends, holidays  CC: Mayo, Pete Pelt, MD

## 2016-07-04 NOTE — Patient Instructions (Addendum)
You have been scheduled for an endoscopy and colonoscopy. Please follow the written instructions given to you at your visit today. Please pick up your prep supplies at the pharmacy within the next 1-3 days. If you use inhalers (even only as needed), please bring them with you on the day of your procedure. Your physician has requested that you go to www.startemmi.com and enter the access code given to you at your visit today. This web site gives a general overview about your procedure. However, you should still follow specific instructions given to you by our office regarding your preparation for the procedure.   Use FD Donald Prose three times a day as needed  Use IBGard Three times a day as needed

## 2016-07-05 ENCOUNTER — Telehealth: Payer: Self-pay | Admitting: Gastroenterology

## 2016-07-05 NOTE — Telephone Encounter (Signed)
Patient will come in on Monday and pick up Suprep sample kit

## 2016-07-19 ENCOUNTER — Encounter: Payer: Self-pay | Admitting: Gastroenterology

## 2016-08-01 ENCOUNTER — Encounter: Payer: Self-pay | Admitting: Gastroenterology

## 2016-08-01 ENCOUNTER — Telehealth: Payer: Self-pay | Admitting: Internal Medicine

## 2016-08-01 ENCOUNTER — Ambulatory Visit (AMBULATORY_SURGERY_CENTER): Payer: BLUE CROSS/BLUE SHIELD | Admitting: Gastroenterology

## 2016-08-01 VITALS — BP 130/82 | HR 66 | Temp 97.5°F | Resp 12 | Ht 60.0 in | Wt 183.0 lb

## 2016-08-01 DIAGNOSIS — R1310 Dysphagia, unspecified: Secondary | ICD-10-CM | POA: Diagnosis not present

## 2016-08-01 DIAGNOSIS — K621 Rectal polyp: Secondary | ICD-10-CM | POA: Diagnosis not present

## 2016-08-01 DIAGNOSIS — Z800 Family history of malignant neoplasm of digestive organs: Secondary | ICD-10-CM

## 2016-08-01 DIAGNOSIS — Z1211 Encounter for screening for malignant neoplasm of colon: Secondary | ICD-10-CM

## 2016-08-01 DIAGNOSIS — D128 Benign neoplasm of rectum: Secondary | ICD-10-CM

## 2016-08-01 DIAGNOSIS — K635 Polyp of colon: Secondary | ICD-10-CM

## 2016-08-01 DIAGNOSIS — K2970 Gastritis, unspecified, without bleeding: Secondary | ICD-10-CM

## 2016-08-01 DIAGNOSIS — D124 Benign neoplasm of descending colon: Secondary | ICD-10-CM

## 2016-08-01 DIAGNOSIS — Z1212 Encounter for screening for malignant neoplasm of rectum: Secondary | ICD-10-CM

## 2016-08-01 DIAGNOSIS — D123 Benign neoplasm of transverse colon: Secondary | ICD-10-CM

## 2016-08-01 MED ORDER — SODIUM CHLORIDE 0.9 % IV SOLN: 500.0000 mL | INTRAVENOUS | Status: DC

## 2016-08-01 NOTE — Patient Instructions (Addendum)
Handouts given on polyps, diverticulosis and gastritis   YOU HAD AN ENDOSCOPIC PROCEDURE TODAY: Refer to the procedure report and other information in the discharge instructions given to you for any specific questions about what was found during the examination. If this information does not answer your questions, please call [place] office at [phone] to clarify.   YOU SHOULD EXPECT: Some feelings of bloating in the abdomen. Passage of more gas than usual. Walking can help get rid of the air that was put into your GI tract during the procedure and reduce the bloating. If you had a lower endoscopy (such as a colonoscopy or flexible sigmoidoscopy) you may notice spotting of blood in your stool or on the toilet paper. Some abdominal soreness may be present for a day or two, also.  DIET: Your first meal following the procedure should be a light meal and then it is ok to progress to your normal diet. A half-sandwich or bowl of soup is an example of a good first meal. Heavy or fried foods are harder to digest and may make you feel nauseous or bloated. Drink plenty of fluids but you should avoid alcoholic beverages for 24 hours. If you had a esophageal dilation, please see attached instructions for diet.    ACTIVITY: Your care partner should take you home directly after the procedure. You should plan to take it easy, moving slowly for the rest of the day. You can resume normal activity the day after the procedure however YOU SHOULD NOT DRIVE, use power tools, machinery or perform tasks that involve climbing or major physical exertion for 24 hours (because of the sedation medicines used during the test).   SYMPTOMS TO REPORT IMMEDIATELY: A gastroenterologist can be reached at any hour. Please call [phone]  for any of the following symptoms:  Following lower endoscopy (colonoscopy, flexible sigmoidoscopy) Excessive amounts of blood in the stool  Significant tenderness, worsening of abdominal pains   Swelling of the abdomen that is new, acute  Fever of 100 or higher  Following upper endoscopy (EGD, EUS, ERCP, esophageal dilation) Vomiting of blood or coffee ground material  New, significant abdominal pain  New, significant chest pain or pain under the shoulder blades  Painful or persistently difficult swallowing  New shortness of breath  Black, tarry-looking or red, bloody stools  FOLLOW UP:  If any biopsies were taken you will be contacted by phone or by letter within the next 1-3 weeks. Call [phone]  if you have not heard about the biopsies in 3 weeks.  Please also call with any specific questions about appointments or follow up tests.

## 2016-08-01 NOTE — Op Note (Signed)
[place] Patient Name: Bailey Hooper Procedure Date: 08/01/2016 2:02 PM MRN: [id number] Endoscopist: Mauri Pole , MD Age: 51 Referring MD:  Date of Birth: 1965/05/17 Gender: Female Account #: [id number] Procedure:                Colonoscopy Indications:              Screening in patient at increased risk: Family                            history of 1st-degree relative with colorectal                            cancer Medicines:                Monitored Anesthesia Care Procedure:                Pre-Anesthesia Assessment:                           - Prior to the procedure, a History and Physical                            was performed, and patient medications and                            allergies were reviewed. The patient's tolerance of                            previous anesthesia was also reviewed. The risks                            and benefits of the procedure and the sedation                            options and risks were discussed with the patient.                            All questions were answered, and informed consent                            was obtained. Prior Anticoagulants: The patient has                            taken no previous anticoagulant or antiplatelet                            agents. ASA Grade Assessment: II - A patient with                            mild systemic disease. After reviewing the risks                            and benefits, the patient was deemed in  satisfactory condition to undergo the procedure.                           - Prior to the procedure, a History and Physical                            was performed, and patient medications and                            allergies were reviewed. The patient's tolerance of                            previous anesthesia was also reviewed. The risks                            and benefits of the procedure and the sedation             options and risks were discussed with the patient.                            All questions were answered, and informed consent                            was obtained. Prior Anticoagulants: The patient has                            taken no previous anticoagulant or antiplatelet                            agents. ASA Grade Assessment: II - A patient with                            mild systemic disease. After reviewing the risks                            and benefits, the patient was deemed in                            satisfactory condition to undergo the procedure.                           After obtaining informed consent, the colonoscope                            was passed under direct vision. Throughout the                            procedure, the patient's blood pressure, pulse, and                            oxygen saturations were monitored continuously. The                            Colonoscope was introduced through the  anus and                            advanced to the the cecum, identified by                            appendiceal orifice and ileocecal valve. The                            colonoscopy was performed without difficulty. The                            patient tolerated the procedure well. The quality                            of the bowel preparation was excellent. The                            ileocecal valve, appendiceal orifice, and rectum                            were photographed. Scope In: 2:24:20 PM Scope Out: 2:40:43 PM Scope Withdrawal Time: 0 hours 12 minutes 55 seconds  Total Procedure Duration: 0 hours 16 minutes 23 seconds  Findings:                 The perianal and digital rectal examinations were                            normal.                           Multiple small and large-mouthed diverticula were                            found in the sigmoid colon and ascending colon.                           Four sessile polyps  were found in the rectum,                            descending colon and transverse colon. The polyps                            were 2 to 5 mm in size. These polyps were removed                            with a cold snare. Resection and retrieval were                            complete.                           Non-bleeding internal hemorrhoids were found during  retroflexion. The hemorrhoids were small. Complications:            No immediate complications. Estimated Blood Loss:     Estimated blood loss was minimal. Impression:               - Diverticulosis in the sigmoid colon and in the                            ascending colon.                           - Four 2 to 5 mm polyps in the rectum, in the                            descending colon and in the transverse colon,                            removed with a cold snare. Resected and retrieved.                           - Non-bleeding internal hemorrhoids. Recommendation:           - Patient has a contact number available for                            emergencies. The signs and symptoms of potential                            delayed complications were discussed with the                            patient. Return to normal activities tomorrow.                            Written discharge instructions were provided to the                            patient.                           - Resume previous diet.                           - Continue present medications.                           - Await pathology results.                           - Repeat colonoscopy in 3 - 5 years for                            surveillance based on pathology results. Mauri Pole, MD 08/01/2016 2:55:35 PM This report has been signed electronically.

## 2016-08-01 NOTE — Telephone Encounter (Signed)
Will forward to Dr. Gerarda Fraction who saw patient for this visit. Ezell Melikian,CMA

## 2016-08-01 NOTE — Progress Notes (Signed)
Pt's states no medical or surgical changes since previsit or office visit. 

## 2016-08-01 NOTE — Progress Notes (Signed)
Patient had decided not to let her care partner come back to recovery while she was being admitted. When she arrived in the recovery area she had been sedated and decided that she was "too high" to not let her ex husband in the recovery room because she was worried that she would not remember anything. I asked my team leader and she came over and witnessed the patient stating that she did want her care partner back in recovery to talk to the doctor. After we both witnessed this we called her care partner back to recovery and both talked to the doctor.

## 2016-08-01 NOTE — Op Note (Signed)
[place] Patient Name: Bailey Hooper Procedure Date: 08/01/2016 2:02 PM MRN: [id number] Endoscopist: Mauri Pole , MD Age: 51 Referring MD:  Date of Birth: 31-Mar-1966 Gender: Female Account #: [id number] Procedure:                Upper GI endoscopy Indications:              Dysphagia, Esophageal reflux symptoms that recur                            despite appropriate therapy, Upper abdominal                            symptoms that persist despite an appropriate trial                            of therapy Medicines:                Monitored Anesthesia Care Procedure:                Pre-Anesthesia Assessment:                           - Prior to the procedure, a History and Physical                            was performed, and patient medications and                            allergies were reviewed. The patient's tolerance of                            previous anesthesia was also reviewed. The risks                            and benefits of the procedure and the sedation                            options and risks were discussed with the patient.                            All questions were answered, and informed consent                            was obtained. Prior Anticoagulants: The patient has                            taken no previous anticoagulant or antiplatelet                            agents. ASA Grade Assessment: II - A patient with                            mild systemic disease. After reviewing the risks  and benefits, the patient was deemed in                            satisfactory condition to undergo the procedure.                           After obtaining informed consent, the endoscope was                            passed under direct vision. Throughout the                            procedure, the patient's blood pressure, pulse, and                            oxygen saturations were monitored continuously.  The                            Model GIF-HQ190 [id number] scope was introduced                            through the mouth, and advanced to the second part                            of duodenum. The upper GI endoscopy was                            accomplished without difficulty. The patient                            tolerated the procedure well. Scope In: Scope Out: Findings:                 The examined esophagus was normal. Biopsies were                            obtained from the proximal and distal esophagus                            with cold forceps for histology of suspected                            eosinophilic esophagitis.                           Scattered mild inflammation characterized by                            congestion (edema) and erythema was found in the                            gastric antrum. Biopsies were taken with a cold                            forceps for Helicobacter pylori testing.  The duodenal bulb and second portion of the                            duodenum were normal. Complications:            No immediate complications. Estimated Blood Loss:     Estimated blood loss was minimal. Impression:               - Normal esophagus. Biopsied.                           - Gastritis. Biopsied.                           - Normal duodenal bulb and second portion of the                            duodenum. Recommendation:           - Patient has a contact number available for                            emergencies. The signs and symptoms of potential                            delayed complications were discussed with the                            patient. Return to normal activities tomorrow.                            Written discharge instructions were provided to the                            patient.                           - Resume previous diet.                           - Continue present medications.                            - Await pathology results. Mauri Pole, MD 08/01/2016 2:51:08 PM This report has been signed electronically.

## 2016-08-01 NOTE — Telephone Encounter (Signed)
Pt is calling because she had her annual physical on 06/11/2016, she ask for this to include all her labs, ( thyroid, urine, std's, ect ). She said that we never called her with any results and that we didn't do a thyroid, std's test at all. She needs to have this thyroid test done ASAP and she would like this to be billed under her annual physical because she should not have to pay for this since it is covered under her annual. She was also told that someone would call her about making an appointment with Dr. Valentina Lucks for a breathing test. She has never heard anything back about that either. Please order the lab's for her thyroid and call her to schedule and at the same time please schedule an appointment with Dr. Valentina Lucks. She is very unhappy with her recent care and would like [place] to step it up a little more. jw

## 2016-08-01 NOTE — Progress Notes (Signed)
Called to room to assist during endoscopic procedure.  Patient ID and intended procedure confirmed with present staff. Received instructions for my participation in the procedure from the performing physician.  

## 2016-08-01 NOTE — Progress Notes (Signed)
To recovery, report to Oliver, RN, VSS 

## 2016-08-02 ENCOUNTER — Telehealth: Payer: Self-pay | Admitting: *Deleted

## 2016-08-02 ENCOUNTER — Other Ambulatory Visit: Payer: Self-pay | Admitting: Internal Medicine

## 2016-08-02 DIAGNOSIS — R5383 Other fatigue: Secondary | ICD-10-CM

## 2016-08-02 NOTE — Telephone Encounter (Signed)
  Follow up Call-  Call back number 08/01/2016  Post procedure Call Back phone  # [phone]  Permission to leave phone message Yes  Some recent data might be hidden     Patient questions:  Do you have a fever, pain , or abdominal swelling? No. Pain Score  0 *  Have you tolerated food without any problems? No.  States has heartburn after eating; ate pizza last night; to take prilosec ththis am, and stay on GERD diet. Call back if worse.  Have you been able to return to your normal activities? Yes.    Do you have any questions about your discharge instructions: Diet   Yes.   Medications  No. Follow up visit  No.  Do you have questions or concerns about your Care? Yes.    Actions: * If pain score is 4 or above: No action needed, pain <4.

## 2016-08-02 NOTE — Telephone Encounter (Signed)
This is very inappropriate. Patient has no indication for thyroid testing. She does not have thyroid disease nor did she have any concerning symtpoms during phsycial. Also STD and urine testing is not routine either in physical testing unless patient is complaining of symtpoms which she was not. Results were mailed to patient. Please see letters sent. I did the appropriate lab work at her physical. If she has concerns about other lab work or symtpoms she needs to follow-up with her PCP. I never tell people that someone will call from our office to schedule appointment with Dr. Valentina Lucks. However please help patient make appointment.

## 2016-08-02 NOTE — Telephone Encounter (Signed)
Spoke with patient and she states that she recently had a GI work up (colonoscopy and endoscopy) and it was recommended that she get a TSH.  Will forward to pcp to order.  Patient is coming in on 08-09-16 for PFts and can get then.  Jazmin Hartsell,CMA

## 2016-08-02 NOTE — Telephone Encounter (Signed)
Attempted to contact pt to inform her of ordered lab test. Pt can have this drawn next week at her 5/[address] apt. Please inform pt if she calls back and schedule a lab visit for this day.

## 2016-08-02 NOTE — Telephone Encounter (Signed)
Order placed for TSH

## 2016-08-07 ENCOUNTER — Encounter: Payer: Self-pay | Admitting: Gastroenterology

## 2016-08-09 ENCOUNTER — Ambulatory Visit: Payer: BLUE CROSS/BLUE SHIELD | Admitting: Pharmacist

## 2016-08-17 ENCOUNTER — Other Ambulatory Visit: Payer: Self-pay

## 2016-08-17 ENCOUNTER — Telehealth: Payer: Self-pay | Admitting: Gastroenterology

## 2016-08-17 MED ORDER — OMEPRAZOLE 40 MG PO CPDR: 40.0000 mg | DELAYED_RELEASE_CAPSULE | Freq: Every day | ORAL | 2 refills | Status: DC

## 2016-08-17 MED ORDER — RANITIDINE HCL 150 MG PO TABS: 150.0000 mg | ORAL_TABLET | Freq: Every day | ORAL | 2 refills | Status: DC

## 2016-08-17 NOTE — Telephone Encounter (Signed)
Continue Omeprazole 40mg  daily before breakfast and please send Rx for Ranitidine 150mg  at bedtime. Please also check if we can given patient some samples for IB Gard and Discount coupon if available. Thanks

## 2016-08-17 NOTE — Telephone Encounter (Signed)
She can use 0.2 cc (1 drop) diluted in warm water TID as needed

## 2016-08-17 NOTE — Telephone Encounter (Signed)
Patient is willing to try this. No samples of IB guard or FD guard. She asks how much peppermint oil. She has seen this in the pharmacy.

## 2016-08-17 NOTE — Telephone Encounter (Signed)
Patient calls with complaints that she continues to belch, feel bloated and she has urgent BM's. The IB guard did help, but it is too expensive to [facility] use of it for her symptoms. She has tried eliminating dairy, sweeteners and spicy foods. These things have not helped. She says she is frustrated and uncomfortable. Please advise. Is there any Rx she could try?

## 2016-08-20 NOTE — Telephone Encounter (Signed)
Called to her home. Left this information on her voicemail.

## 2016-08-23 ENCOUNTER — Ambulatory Visit: Payer: BLUE CROSS/BLUE SHIELD | Admitting: Pharmacist

## 2016-09-06 ENCOUNTER — Telehealth: Payer: Self-pay | Admitting: Gastroenterology

## 2016-09-06 ENCOUNTER — Other Ambulatory Visit: Payer: Self-pay

## 2016-09-06 DIAGNOSIS — R197 Diarrhea, unspecified: Secondary | ICD-10-CM

## 2016-09-06 MED ORDER — ONDANSETRON HCL 4 MG PO TABS: 4.0000 mg | ORAL_TABLET | Freq: Three times a day (TID) | ORAL | 0 refills | Status: DC | PRN

## 2016-09-06 NOTE — Telephone Encounter (Signed)
Please advise patient to avoid NSAID's . Continue PPI and Zofran 4mg  q8h as needed for nausea. Check GI pathogen panel for diarrhea. Thanks

## 2016-09-06 NOTE — Telephone Encounter (Signed)
Calls because she is having diarrhea and sore abdomen. Afebrile. Belching and feels nauseous. She is not eating. Per patient she is only sipping room temp water. Diarrhea was waking her up last night. Has had 3 stools since 5 am. She has Bentyl from her PCP. She says took a total of 6 of the Bentyl yesterday. Doesn't feel well enough to go to work today. Please advise.

## 2016-09-06 NOTE — Telephone Encounter (Incomplete)
Please

## 2016-09-06 NOTE — Telephone Encounter (Signed)
Patient is in agreement with this plan. She will come to get her stool containers today. Wants to try to go to work. May use imodium after stool collection. Medication to [employer]

## 2016-09-11 ENCOUNTER — Other Ambulatory Visit: Payer: BLUE CROSS/BLUE SHIELD

## 2016-09-11 DIAGNOSIS — R197 Diarrhea, unspecified: Secondary | ICD-10-CM

## 2016-09-13 LAB — GASTROINTESTINAL PATHOGEN PANEL PCR
C. difficile Tox A/B, PCR: NOT DETECTED
CAMPYLOBACTER, PCR: NOT DETECTED
Cryptosporidium, PCR: NOT DETECTED
E coli (ETEC) LT/ST PCR: NOT DETECTED
E coli (STEC) stx1/stx2, PCR: NOT DETECTED
E coli 0157, PCR: NOT DETECTED
GIARDIA LAMBLIA, PCR: NOT DETECTED
NOROVIRUS, PCR: NOT DETECTED
Rotavirus A, PCR: NOT DETECTED
SHIGELLA, PCR: NOT DETECTED
Salmonella, PCR: NOT DETECTED

## 2016-09-17 ENCOUNTER — Other Ambulatory Visit: Payer: Self-pay | Admitting: Internal Medicine

## 2016-09-17 MED ORDER — CLONAZEPAM 0.5 MG PO TABS
0.5000 mg | ORAL_TABLET | Freq: Three times a day (TID) | ORAL | 0 refills | Status: DC | PRN
Start: 2016-09-17 — End: 2016-11-05

## 2016-09-17 NOTE — Telephone Encounter (Signed)
Please let patient know that her prescription is ready to pick-up at the front desk.

## 2016-09-17 NOTE — Telephone Encounter (Signed)
LM for patient that script is ready for pick up. Myers Tutterow,CMA  

## 2016-09-17 NOTE — Telephone Encounter (Signed)
Pt called because she needs a refill on her Clonazepam. Her pharmacy said that they have been faxing [place] since last week with no response. Can we get this called in right away since she is now out of medication. jw

## 2016-09-22 ENCOUNTER — Telehealth: Payer: Self-pay | Admitting: Internal Medicine

## 2016-09-22 MED ORDER — FLUCONAZOLE 150 MG PO TABS: 150.0000 mg | ORAL_TABLET | Freq: Every day | ORAL | 0 refills | Status: DC

## 2016-09-22 NOTE — Telephone Encounter (Signed)
**  After Hours/ Emergency Line Call*  Received a call to report that [facility].  - burning, swelling of vaginal area - this is usual symptoms she gets with her recurrent vaginal yeast infections - reports she is going to the bathroom a lot more but  no dysuria - minimal vaginal discharge - no fevers or chills  - asks if there is anything we can provide to get her through the weekend.   Diflucan sent to pharmacy. Made patient an appointment with same day physician for 6/18, and instructed she still should go to this to be evaluated.   Smiley Houseman, MD PGY-2, [facility]

## 2016-09-24 ENCOUNTER — Ambulatory Visit: Payer: BLUE CROSS/BLUE SHIELD | Admitting: Student

## 2016-09-26 ENCOUNTER — Other Ambulatory Visit: Payer: Self-pay | Admitting: Internal Medicine

## 2016-09-26 ENCOUNTER — Other Ambulatory Visit: Payer: Self-pay | Admitting: Family Medicine

## 2016-09-26 DIAGNOSIS — G4489 Other headache syndrome: Secondary | ICD-10-CM

## 2016-09-26 DIAGNOSIS — I10 Essential (primary) hypertension: Secondary | ICD-10-CM

## 2016-10-24 ENCOUNTER — Ambulatory Visit (INDEPENDENT_AMBULATORY_CARE_PROVIDER_SITE_OTHER): Payer: BLUE CROSS/BLUE SHIELD | Admitting: Women's Health

## 2016-10-24 ENCOUNTER — Encounter: Payer: Self-pay | Admitting: Women's Health

## 2016-10-24 VITALS — BP 128/80 | Ht 66.0 in | Wt 183.0 lb

## 2016-10-24 DIAGNOSIS — N898 Other specified noninflammatory disorders of vagina: Secondary | ICD-10-CM

## 2016-10-24 DIAGNOSIS — B9689 Other specified bacterial agents as the cause of diseases classified elsewhere: Secondary | ICD-10-CM | POA: Diagnosis not present

## 2016-10-24 DIAGNOSIS — N760 Acute vaginitis: Secondary | ICD-10-CM

## 2016-10-24 LAB — WET PREP FOR TRICH, YEAST, CLUE
Trich, Wet Prep: NONE SEEN
Yeast Wet Prep HPF POC: NONE SEEN

## 2016-10-24 MED ORDER — METRONIDAZOLE 500 MG PO TABS: 500.0000 mg | ORAL_TABLET | Freq: Two times a day (BID) | ORAL | 0 refills | Status: DC

## 2016-10-24 NOTE — Progress Notes (Signed)
Presents with complaint of irregular spotting. Last Nexplanon placed 06/2014. Same partner with several breakup. Has not been to our office in 2010 reports normal Pap and mammogram history. Reports having negative HIV, hepatitis or RPR but did not have a GC Chlamydia culture at that time. Having some increased discharge, denies urinary symptoms, abdominal pain or fever. Hypertension managed by primary care.  Exam: Appears well. Nexplanon palpated upper inner left arm. External genitalia within normal limits, speculum exam moderate amount of adherent white discharge with odor noted, wet prep positive for moderate clues, TNTC bacteria. GC/Chlamydia culture taken.  Bacteria vaginosis  06/2014 Nexplanon  Plan: Reviewed occasional irregular spotting normal with Nexplanon. Flagyl 500 twice daily for 7 days, prescription, proper use given alcohol precautions reviewed. GC/Chlamydia culture pending. Encouraged condoms until permanent partner. Encouraged to continue counseling, under increased stress - family situation. Schedule annual exam. GC/Chlamydia culture pending. Follow-up with primary care to make sure Nexplanon is not due to be removed.

## 2016-10-24 NOTE — Patient Instructions (Signed)

## 2016-10-25 LAB — GC/CHLAMYDIA PROBE AMP
CT Probe RNA: NOT DETECTED
GC Probe RNA: NOT DETECTED

## 2016-11-05 ENCOUNTER — Encounter: Payer: Self-pay | Admitting: Internal Medicine

## 2016-11-05 ENCOUNTER — Ambulatory Visit (INDEPENDENT_AMBULATORY_CARE_PROVIDER_SITE_OTHER): Payer: BLUE CROSS/BLUE SHIELD | Admitting: Internal Medicine

## 2016-11-05 DIAGNOSIS — F411 Generalized anxiety disorder: Secondary | ICD-10-CM | POA: Diagnosis not present

## 2016-11-05 MED ORDER — CLONAZEPAM 0.5 MG PO TABS: 0.5000 mg | ORAL_TABLET | Freq: Three times a day (TID) | ORAL | 0 refills | Status: DC | PRN

## 2016-11-05 MED ORDER — BUSPIRONE HCL 5 MG PO TABS: 5.0000 mg | ORAL_TABLET | Freq: Two times a day (BID) | ORAL | 0 refills | Status: DC

## 2016-11-05 NOTE — Progress Notes (Signed)
   [facility] Phone: [phone]  Subjective:  Bailey Hooper is a 51 year old female presenting to clinic for stress and anxiety. She states that she has a lot going on in her life. She thinks she is about to lose her job because she missed a shift that she was supposed to go in for. She is having difficulty concentrating at work. 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 Her daughter and grandchildren moved in with her in the last year because her daughters boyfriend was arrested for murder. She feels like she does not make enough money to take care of them all. She states she "has no financial support". Her daughter is also dealing with uncontrolled bipolar disorder and refuses to seek help for this. She has had to call DSS on her daughter because her daughter does not take care of her kids when patient is at work. Patient states "I don't feel like I'm strong enough to handle all this". She is having difficulty sleeping and feels like her thoughts are racing. She is trying to set up family therapy for her and her granddaughter. She has been on multiple different depression and anxiety medications in the past including Prozac, Zoloft, Seroquel, Effexor, Celexa, Wellbutrin. None of these medications helped and most caused side effects.  ROS: See HPI for pertinent positives and negatives  Past Medical History- HTN, IBS, anxiety, depression, migraines, GERD, genital herpes, asthma  Family history reviewed for today's visit. No changes.  Social history- patient is a current smoker  Objective: BP 122/80   Pulse 73   Temp 98.6 F (37 C) (Oral)   Wt 180 lb (81.6 kg)   SpO2 97%   BMI 29.05 kg/m  Gen: Tearful, but in NAD Psych: Normal rate of speech, crying intermittently, normal thought content, normal judgment, no SI/HI.  PHQ-9: 23 GAD-7: 21  Assessment/Plan: Anxiety: Uncontrolled. Related to job and life  stress. Patient declines [place] consultation in clinic today. - Start Buspar 5mg  bid - Refilled Klonopin prescription - Discussed the importance of getting into to see a therapist. Patient will work on this. - Follow-up in 4-6 weeks for anxiety follow-up   Hyman Bible, MD PGY-3

## 2016-11-05 NOTE — Assessment & Plan Note (Signed)
Uncontrolled. Related to job and life stress. Patient declines [place] consultation in clinic today. - Start Buspar 5mg  bid - Refilled Klonopin prescription - Discussed the importance of getting into to see a therapist. Patient will work on this. - Follow-up in 4-6 weeks for anxiety follow-up

## 2016-11-05 NOTE — Patient Instructions (Signed)
It was so nice to see you!  I have started a medication called Buspar to help with the anxiety. Please take 1 tablet twice a day. I have also refilled your Klonopin.  We will see you back in 4-6 weeks.  -Dr. Brett Albino

## 2016-11-24 ENCOUNTER — Other Ambulatory Visit: Payer: Self-pay | Admitting: Internal Medicine

## 2016-11-24 ENCOUNTER — Encounter: Payer: Self-pay | Admitting: Internal Medicine

## 2016-11-24 DIAGNOSIS — G4489 Other headache syndrome: Secondary | ICD-10-CM

## 2016-12-05 ENCOUNTER — Encounter: Payer: Self-pay | Admitting: Internal Medicine

## 2016-12-05 ENCOUNTER — Telehealth: Payer: Self-pay | Admitting: Internal Medicine

## 2016-12-05 ENCOUNTER — Ambulatory Visit: Payer: BLUE CROSS/BLUE SHIELD

## 2016-12-05 NOTE — Telephone Encounter (Signed)
Pt is calling because she needs a letter stating that she had her physical earlier in the year. They would need the date of the physical and that everything was okay and she is fine for work. This is a new job. She needs this letter by 3 PM today. jw

## 2016-12-05 NOTE — Telephone Encounter (Signed)
Letter placed up front for patient to pick up. I called patient and let her know that she can pick it up. She voiced understanding.

## 2016-12-05 NOTE — Telephone Encounter (Signed)
Will forward to MD. Draeden Kellman,CMA  

## 2016-12-07 ENCOUNTER — Ambulatory Visit (INDEPENDENT_AMBULATORY_CARE_PROVIDER_SITE_OTHER): Payer: BLUE CROSS/BLUE SHIELD | Admitting: *Deleted

## 2016-12-07 DIAGNOSIS — Z111 Encounter for screening for respiratory tuberculosis: Secondary | ICD-10-CM

## 2016-12-07 NOTE — Progress Notes (Signed)
   Patient in nurse clinic today for PPD placement.  PPD placed left forearm.  [facility] is closed Monday due to the Labor Day.  Called urgent care to check to see if they will read it.  Spoke with CMA, patient should be able to have it read.  Derl Barrow, RN

## 2016-12-10 ENCOUNTER — Telehealth (HOSPITAL_COMMUNITY): Payer: Self-pay | Admitting: Emergency Medicine

## 2016-12-10 LAB — TB SKIN TEST: TB Skin Test: NEGATIVE

## 2016-12-10 NOTE — Telephone Encounter (Signed)
Read left forearm for TB skin test, test negative.

## 2016-12-31 ENCOUNTER — Other Ambulatory Visit: Payer: Self-pay | Admitting: Gastroenterology

## 2016-12-31 ENCOUNTER — Other Ambulatory Visit: Payer: Self-pay | Admitting: Internal Medicine

## 2016-12-31 DIAGNOSIS — I10 Essential (primary) hypertension: Secondary | ICD-10-CM

## 2017-01-08 NOTE — Telephone Encounter (Signed)
Done

## 2017-01-10 ENCOUNTER — Ambulatory Visit: Payer: BLUE CROSS/BLUE SHIELD | Admitting: Internal Medicine

## 2017-01-27 ENCOUNTER — Other Ambulatory Visit: Payer: Self-pay | Admitting: Gastroenterology

## 2017-02-14 ENCOUNTER — Other Ambulatory Visit: Payer: Self-pay | Admitting: Internal Medicine

## 2017-02-14 MED ORDER — CLONAZEPAM 0.5 MG PO TABS: 0.5000 mg | ORAL_TABLET | Freq: Three times a day (TID) | ORAL | 0 refills | Status: DC | PRN

## 2017-02-14 NOTE — Telephone Encounter (Signed)
Pt contacted and informed of rx ready for pick up. Pt informed we are closing tomm at 1230 for the weekend. Pt voiced understanding.

## 2017-02-14 NOTE — Telephone Encounter (Signed)
Patient needs refill on clonazepam to pick up.  She also is trying to get refill on all her other meds at [facility], [employer].  [phone] (may leave message), thanks.

## 2017-02-14 NOTE — Telephone Encounter (Signed)
Klonopin prescription left up front for patient to pick up. Thanks!

## 2017-02-17 ENCOUNTER — Other Ambulatory Visit: Payer: Self-pay | Admitting: Internal Medicine

## 2017-02-17 DIAGNOSIS — G4489 Other headache syndrome: Secondary | ICD-10-CM

## 2017-02-17 DIAGNOSIS — I10 Essential (primary) hypertension: Secondary | ICD-10-CM

## 2017-02-18 NOTE — Telephone Encounter (Signed)
Patient aware that script is ready for pick up and also other scripts were sent to the pharmacy. Caree Wolpert,CMA

## 2017-02-18 NOTE — Telephone Encounter (Signed)
Refilled the Klonopin last week (11/8) and it's sitting up front for patient to pick up.

## 2017-02-26 ENCOUNTER — Other Ambulatory Visit: Payer: Self-pay | Admitting: Gastroenterology

## 2017-02-27 ENCOUNTER — Ambulatory Visit: Payer: BLUE CROSS/BLUE SHIELD | Admitting: Internal Medicine

## 2017-02-27 ENCOUNTER — Other Ambulatory Visit: Payer: Self-pay

## 2017-02-27 ENCOUNTER — Encounter: Payer: Self-pay | Admitting: Internal Medicine

## 2017-02-27 VITALS — BP 182/100 | HR 82 | Temp 97.9°F | Wt 180.0 lb

## 2017-02-27 DIAGNOSIS — I10 Essential (primary) hypertension: Secondary | ICD-10-CM | POA: Diagnosis not present

## 2017-02-27 DIAGNOSIS — M7989 Other specified soft tissue disorders: Secondary | ICD-10-CM

## 2017-02-27 DIAGNOSIS — F411 Generalized anxiety disorder: Secondary | ICD-10-CM | POA: Diagnosis not present

## 2017-02-27 DIAGNOSIS — M799 Soft tissue disorder, unspecified: Secondary | ICD-10-CM | POA: Insufficient documentation

## 2017-02-27 MED ORDER — MUPIROCIN CALCIUM 2 % EX CREA: 1.0000 "application " | TOPICAL_CREAM | Freq: Two times a day (BID) | CUTANEOUS | 0 refills | Status: DC

## 2017-02-27 MED ORDER — BUSPIRONE HCL 5 MG PO TABS: ORAL_TABLET | ORAL | 0 refills | Status: DC

## 2017-02-27 MED ORDER — LOSARTAN POTASSIUM 50 MG PO TABS: 50.0000 mg | ORAL_TABLET | Freq: Every day | ORAL | 0 refills | Status: DC

## 2017-02-27 NOTE — Assessment & Plan Note (Addendum)
Uncontrolled. GAD-7 is a 19, PHQ-9 is a 21. Taking Buspar 5mg  bid and Klonopin prn. - Increase Buspar to 5mg  in the morning and 10mg  in the evening for now (patient worried 10mg  bid will make her sleepy). Will titrate dose up from there. - Continue Klonopin, but discussed that this medication is addictive and she should use this as little as possible. - Patient given information on therapists in the [place] area. States she will call around to schedule an appointment. - Follow-up in 1 month

## 2017-02-27 NOTE — Assessment & Plan Note (Signed)
Patient with hyperpigmented lesion on medial right ankle. She has an overlying callus, but the lesion beneath the callus seems somewhat fluctuant and is sore to the touch. Unclear if this is a hyperkeratotic lesion or a resolving boil. I&D attempted in clinic today, but patient did not tolerate this well and only blood was able to be expelled. - Mupirocin ointment prescribed - May need to consider referral to Podiatry- unclear if this will need debridement.

## 2017-02-27 NOTE — Assessment & Plan Note (Signed)
Uncontrolled. BP 182/100 in clinic today. Did not take her BP medication this morning. - Continue Norvasc 10mg  daily - Add Losartan 50mg  daily - Check BMP in 1 week - Follow-up in RN clinic in 1 week for BP check.

## 2017-02-27 NOTE — Patient Instructions (Addendum)
It was so nice to see you!  For your anxiety and depression, let's increase your Buspar to 5mg  in the morning and 10mg  in the evening. Please give this a try for 2 weeks. We can always increase from there.  Please follow-up in nursing clinic in 1 week for a blood pressure check and have labs done.  -Dr. Brett Albino

## 2017-02-27 NOTE — Progress Notes (Signed)
[facility] Phone: [phone]  Subjective:  Bailey Hooper is a 51 year old female presenting to clinic to discuss anxiety, HTN, and for a new boil on her ankle.  Anxiety/depression: Has had this for years. Currently taking Buspar bid and Klonopin prn. Has tried other SSRIs in the past, but they haven't worked for her. She also saw a therapist in 2010, but only went to 5-6 sessions because she didn't feel like she connected with the therapist. States her anxiety is worse recently because of her life stress. 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 Her daughter and grandchildren recently moved in with her and she is trying to support them financially, which has been difficult for her. She is having difficulty sleeping and difficulty with concentration. No SI/HI.  HTN: Currently taking Norvasc 10mg  daily. Stopped taking the Propranolol, because it wasn't helping with the headaches. She has also taken Lisinopril in the past, which caused cough. She also tried Losartan-HCTZ but states she "didn't like the HCTZ". She denies chest pain, shortness of breath, or lower extremity edema. She does not check her blood pressures at home.  Boil on Ankle: Has been there for the last month. Located on her medial right ankle. Has not changed over the last month. The lesion is sore and rubs up against her shoes. She has not tried putting anything on this.  ROS: See HPI for pertinent positives and negatives  Past Medical History- HTN, migraines, GERD, IBS, recurrent boils, genital herpes, asthma, depression, anxiety.  Family history reviewed for today's visit. No changes.  Social history- patient is a current smoker  Objective: BP (!) 182/100   Pulse 82   Temp 97.9 F (36.6 C) (Oral)   Wt 180 lb (81.6 kg)   LMP 02/12/2017   SpO2 99%   BMI 29.05 kg/m  Gen: NAD, alert, cooperative with exam HEENT: NCAT, EOMI, MMM Neck: FROM, supple CV: RRR GI: SNTND, BS present,  no guarding or organomegaly Msk: No edema, warm, normal tone, moves UE/LE spontaneously Neuro: Alert and oriented, no gross deficits Skin: 1cm x 1cm circular hyperpigmented, ?fluctuant lesion present on the right medial ankle; overlying callus present; lesion is sore to the touch; no drainage; no overlying erythema. Psych: talks at a fast pace, normal behavior, appropriate affect, normal thought content, normal judgement.  GAD-7: 19 PHQ-9: 21  Assessment/Plan: Anxiety: Uncontrolled. GAD-7 is a 19, PHQ-9 is a 21. Taking Buspar 5mg  bid and Klonopin prn. - Increase Buspar to 5mg  in the morning and 10mg  in the evening for now (patient worried 10mg  bid will make her sleepy). Will titrate dose up from there. - Continue Klonopin, but discussed that this medication is addictive and she should use this as little as possible. - Patient given information on therapists in the [place] area. States she will call around to schedule an appointment. - Follow-up in 1 month  HTN: Uncontrolled. BP 182/100 in clinic today. Did not take her BP medication this morning. - Continue Norvasc 10mg  daily - Add Losartan 50mg  daily - Check BMP in 1 week - Follow-up in RN clinic in 1 week for BP check.  Lesion of Ankle: Patient with hyperpigmented lesion on medial right ankle. She has an overlying callus, but the lesion beneath the callus seems somewhat fluctuant and is sore to the touch. Unclear if this is a hyperkeratotic lesion or a resolving boil. I&D attempted in clinic today, but patient did not tolerate this well and only blood was  able to be expelled. - Mupirocin ointment prescribed - May need to consider referral to Podiatry- unclear if this will need debridement.  Procedure: I&D abscess -Informed consent was obtained. -Timeout performed -The right medial ankle was prepped with sterile iodine in the usual fashion. -An 11 blade was used to make a 1cm incision across the region of fluctuance. -Bloody  drainage was expressed from the abscess.  -We discussed wound care, including frequent application of warm compresses 6 times a day. -We discussed reasons to call or return to the clinic sooner, including increasing pain, fever, or bleeding.   Hyman Bible, MD PGY-3

## 2017-03-13 ENCOUNTER — Other Ambulatory Visit: Payer: Self-pay | Admitting: Internal Medicine

## 2017-03-13 ENCOUNTER — Ambulatory Visit (INDEPENDENT_AMBULATORY_CARE_PROVIDER_SITE_OTHER): Payer: BLUE CROSS/BLUE SHIELD | Admitting: *Deleted

## 2017-03-13 ENCOUNTER — Other Ambulatory Visit: Payer: Self-pay

## 2017-03-13 VITALS — BP 166/96 | HR 69

## 2017-03-13 DIAGNOSIS — I10 Essential (primary) hypertension: Secondary | ICD-10-CM

## 2017-03-13 DIAGNOSIS — R5383 Other fatigue: Secondary | ICD-10-CM

## 2017-03-13 DIAGNOSIS — Z0130 Encounter for examination of blood pressure without abnormal findings: Secondary | ICD-10-CM

## 2017-03-13 NOTE — Progress Notes (Signed)
    Patient presents for BP check Reports difficulty taking BP meds on regular schedule 2/2 family situation and stress. Discussed importance of taking meds same time every day. Patient will try setting alarm on phone to remind her to take. Last doses 2 hours ago  [place] given to potentially help with strategies to reduce stress.   Discussed need for low sodium diet   Patient exercising at [facility] twice weekly for 30 minutes. Also has stairs in home and at work that she regularly climbs.  Patient denies headaches, blurred vision, SHOB, chest pain   Vitals:   03/13/17 1548  BP: (!) 166/96  Pulse: 69  SpO2: 99%   Patient states home BPs have been 160s/80s  L. Silvano Rusk, RN, BSN

## 2017-03-14 LAB — BASIC METABOLIC PANEL
BUN / CREAT RATIO: 15 (ref 9–23)
BUN: 12 mg/dL (ref 6–24)
CHLORIDE: 102 mmol/L (ref 96–106)
CO2: 26 mmol/L (ref 20–29)
CREATININE: 0.82 mg/dL (ref 0.57–1.00)
Calcium: 8.8 mg/dL (ref 8.7–10.2)
GFR calc Af Amer: 96 mL/min/{1.73_m2} (ref >59)
GFR calc non Af Amer: 83 mL/min/{1.73_m2} (ref >59)
Glucose: 95 mg/dL (ref 65–99)
Potassium: 3.9 mmol/L (ref 3.5–5.2)
Sodium: 137 mmol/L (ref 134–144)

## 2017-03-14 LAB — TSH: TSH: 0.594 u[IU]/mL (ref 0.450–4.500)

## 2017-03-15 ENCOUNTER — Encounter: Payer: Self-pay | Admitting: *Deleted

## 2017-03-15 ENCOUNTER — Telehealth: Payer: Self-pay | Admitting: *Deleted

## 2017-03-15 ENCOUNTER — Other Ambulatory Visit: Payer: Self-pay | Admitting: Internal Medicine

## 2017-03-15 DIAGNOSIS — I10 Essential (primary) hypertension: Secondary | ICD-10-CM

## 2017-03-15 DIAGNOSIS — G4489 Other headache syndrome: Secondary | ICD-10-CM

## 2017-03-15 NOTE — Telephone Encounter (Signed)
-----   Message from Sela Hua, MD sent at 03/15/2017  2:03 PM EST ----- Please let Ms. Traub know that her labs were completely normal. Thanks!

## 2017-03-15 NOTE — Telephone Encounter (Signed)
Tried calling patient but there was no answer or voicemail.  I will send her a mychart message. Carroll Ranney,CMA

## 2017-03-22 ENCOUNTER — Ambulatory Visit (INDEPENDENT_AMBULATORY_CARE_PROVIDER_SITE_OTHER): Payer: BLUE CROSS/BLUE SHIELD | Admitting: Family Medicine

## 2017-03-22 ENCOUNTER — Encounter: Payer: Self-pay | Admitting: Family Medicine

## 2017-03-22 ENCOUNTER — Telehealth: Payer: Self-pay | Admitting: *Deleted

## 2017-03-22 ENCOUNTER — Other Ambulatory Visit: Payer: Self-pay

## 2017-03-22 VITALS — BP 140/92 | HR 86 | Temp 98.9°F | Ht 66.0 in | Wt 178.6 lb

## 2017-03-22 DIAGNOSIS — J029 Acute pharyngitis, unspecified: Secondary | ICD-10-CM | POA: Diagnosis not present

## 2017-03-22 LAB — POCT RAPID STREP A (OFFICE): Rapid Strep A Screen: NEGATIVE

## 2017-03-22 MED ORDER — AMOXICILLIN 250 MG PO CAPS: 250.0000 mg | ORAL_CAPSULE | Freq: Three times a day (TID) | ORAL | 0 refills | Status: DC

## 2017-03-22 NOTE — Telephone Encounter (Signed)
Pt lm on nurse line.  States that her daughter was recently dx with strep and given amoxicillin.    States that now her throat hurts. Wants to know if something can be called in.  Attempted to call and schedule an appt but call went straight to VM and I had to leave a message. Clinton Sawyer, [name], [place]

## 2017-03-22 NOTE — Progress Notes (Signed)
Subjective  Patient is presenting with the following illnesses  SORE THROAT  Sore throat began 1-2 days ago. Pain interferes with: swallowing Progression: getting worse Medications tried: ibuprofen tylenol Strep throat exposure: yes daughter diagnosed yesterday STD exposure: no  Symptoms Fever: mild Cough: no Runny nose: yes Muscle aches: no Swollen Glands: no Trouble breathing: no Drooling: no Weight loss: no   Review of Symptoms - see HPI PMH - Smoking status noted.       Chief Complaint noted Review of Symptoms - see HPI PMH - Smoking status noted.     Objective Vital Signs reviewed Lungs:  Normal respiratory effort, chest expands symmetrically. Lungs are clear to auscultation, no crackles or wheezes. Heart - Regular rate and rhythm.  No murmurs, gallops or rubs.    Throat: red mucosa, no exudate, uvula midline, no redness  Neck:  No deformities, thyromegaly, masses, or tenderness noted.   Supple with full range of motion without pain.     Assessments/Plans  Sore throat - The most likely diagnosis is strep given her recent exposure.   I have considered and concluded the patient has a very low likelihood of having: Throat/Neck Cancer, GERD, Peritonsillar abscess, Candidiasis, Gonorrhea, Epiglottitis, Herpes Simplex,  Thyroiditis, Lemierre syndrome (Thrombophlebitis of the jugular vein) Behcet syndrome (oral aphthous ulcers, genital ulcers, and uveitis)  She has taken amox/clav in past without allergy problems     See after visit summary for details of patient instuctions

## 2017-03-22 NOTE — Telephone Encounter (Signed)
Agree that patient needs appointment. We don't call in antibiotics over the phone. Thanks!

## 2017-03-22 NOTE — Progress Notes (Signed)
rapi

## 2017-03-22 NOTE — Telephone Encounter (Signed)
Patient called c/o 1 1/2 week of bleeding with Nexplanon, had placed in 2016 per nancy young note at [place] on 10/24/16. Pt said wears tampons changes every 2-3 hours , noticed clots this am, bleeding varies from brownish blood to red blood. I advised pt to schedule OV with provider, pt said she is worried about bleeding, has never had bleeding. States has had a cycle every month for 2 months. Transferred to front desk.

## 2017-03-22 NOTE — Patient Instructions (Addendum)
Good to see you today!  Thanks for coming in.  Sore Throat Treatment - you should:doing all you are doing and add nasal saline one squirt each nostril every 1-3 hours  Amox three times a day  You should be better in: 4-5 days  Call us or go to the ER if you can't swallow liquids, have trouble breathing

## 2017-03-23 ENCOUNTER — Encounter: Payer: Self-pay | Admitting: Internal Medicine

## 2017-03-27 ENCOUNTER — Encounter: Payer: Self-pay | Admitting: Obstetrics & Gynecology

## 2017-03-27 ENCOUNTER — Ambulatory Visit (INDEPENDENT_AMBULATORY_CARE_PROVIDER_SITE_OTHER): Payer: BLUE CROSS/BLUE SHIELD | Admitting: Obstetrics & Gynecology

## 2017-03-27 VITALS — BP 130/90

## 2017-03-27 DIAGNOSIS — Z113 Encounter for screening for infections with a predominantly sexual mode of transmission: Secondary | ICD-10-CM

## 2017-03-27 DIAGNOSIS — Z978 Presence of other specified devices: Secondary | ICD-10-CM

## 2017-03-27 DIAGNOSIS — N898 Other specified noninflammatory disorders of vagina: Secondary | ICD-10-CM | POA: Diagnosis not present

## 2017-03-27 DIAGNOSIS — N921 Excessive and frequent menstruation with irregular cycle: Secondary | ICD-10-CM | POA: Diagnosis not present

## 2017-03-27 DIAGNOSIS — A599 Trichomoniasis, unspecified: Secondary | ICD-10-CM

## 2017-03-27 DIAGNOSIS — Z975 Presence of (intrauterine) contraceptive device: Secondary | ICD-10-CM

## 2017-03-27 DIAGNOSIS — D219 Benign neoplasm of connective and other soft tissue, unspecified: Secondary | ICD-10-CM

## 2017-03-27 LAB — WET PREP FOR TRICH, YEAST, CLUE

## 2017-03-27 MED ORDER — FLUCONAZOLE 150 MG PO TABS: 150.0000 mg | ORAL_TABLET | Freq: Every day | ORAL | 2 refills | Status: AC

## 2017-03-27 MED ORDER — METRONIDAZOLE 500 MG PO TABS: 500.0000 mg | ORAL_TABLET | Freq: Two times a day (BID) | ORAL | 0 refills | Status: AC

## 2017-03-27 NOTE — Progress Notes (Signed)
    Bailey Hooper 07-27-1965 [id number]        51 y.o.  G2P0201 Boyfriend  RP: Breakthrough bleeding on Nexplanon with vaginal discharge and itching  HPI: On Nexplanon since March 2016.  Several breakups with boyfriend.  Treated for bacterial vaginosis in July 2018 with Elon Alas.  Gonorrhea and Chlamydia negative at that time.  Started having mild breakthrough bleeding on Nexplanon and for the past few months.  Also complains of an increased discharge and vaginal itching currently.  No pelvic pain.  No urinary tract infection symptoms.  No fever.  Past medical history,surgical history, problem list, medications, allergies, family history and social history were all reviewed and documented in the EPIC chart.  Directed ROS with pertinent positives and negatives documented in the history of present illness/assessment and plan.  Exam:  Vitals:   03/27/17 0949  BP: 130/90   General appearance:  Normal  Gyn exam:  Vulva normal.  Speculum:  Cervix/Vagina tender.  Increased vaginal d/c, bubbly.  Wet prep done.  Gono-Chlam done.                    Bimanual exam:  AV uterus, nodular, mobile, NT.  No adnexal mass, NT.  Wet prep Trichomonas and BV present.   Assessment/Plan:  51 y.o. G2P0201   1. Vaginal pruritus Bacterial vaginosis and trichomonas positive on wet prep.  Patient informed that trichomonas is a sexually transmitted infection.  Gonorrhea and chlamydia pending.  Will treat with metronidazole 500 mg per mouth twice a day for 7 days.  No contraindication, no allergy.  Usage, risks and benefits reviewed.  Will take fluconazole as needed to prevent a yeast infection post antibiotics.  - WET PREP FOR [place], YEAST, CLUE  2. Breakthrough bleeding on Nexplanon Breakthrough bleeding on Nexplanon.  Rule out endometrial pathology.  Follow-up pelvic ultrasound. - US Transvaginal Non-OB; Future  3. Fibroids Probable uterine fibroids per gynecologic exam today.  Will follow up for a pelvic  ultrasound to evaluate for fibroids and because of the irregular vaginal bleeding. - US Transvaginal Non-OB; Future  4. Screen for STD (sexually transmitted disease) Strict condom use recommended. - Gono-Chlam - HIV antibody (with reflex) - RPR - Hepatitis B Surface AntiGEN - Hepatitis C Antibody  5. Trichomonas vaginalis infection Diagnosis and management reviewed with patient.  Will treat with Flagyl per mouth as described above.  Other orders - metroNIDAZOLE (FLAGYL) 500 MG tablet; Take 1 tablet (500 mg total) by mouth 2 (two) times daily for 7 days. - fluconazole (DIFLUCAN) 150 MG tablet; Take 1 tablet (150 mg total) by mouth daily for 3 days.  Counseling on above issues more than 50% for 25 minutes.  Princess Bruins MD, 10:03 AM 03/27/2017

## 2017-03-28 LAB — C. TRACHOMATIS/N. GONORRHOEAE RNA
C. trachomatis RNA, TMA: NOT DETECTED
N. gonorrhoeae RNA, TMA: NOT DETECTED

## 2017-03-29 ENCOUNTER — Encounter: Payer: Self-pay | Admitting: Obstetrics & Gynecology

## 2017-03-31 ENCOUNTER — Encounter: Payer: Self-pay | Admitting: Obstetrics & Gynecology

## 2017-03-31 NOTE — Patient Instructions (Signed)
1. Vaginal pruritus Bacterial vaginosis and trichomonas positive on wet prep.  Patient informed that trichomonas is a sexually transmitted infection.  Gonorrhea and chlamydia pending.  Will treat with metronidazole 500 mg per mouth twice a day for 7 days.  No contraindication, no allergy.  Usage, risks and benefits reviewed.  Will take fluconazole as needed to prevent a yeast infection post antibiotics.  - WET PREP FOR Bailey Hooper, YEAST, CLUE  2. Breakthrough bleeding on Nexplanon Breakthrough bleeding on Nexplanon.  Rule out endometrial pathology.  Follow-up pelvic ultrasound. - US Transvaginal Non-OB; Future  3. Fibroids Probable uterine fibroids per gynecologic exam today.  Will follow up for a pelvic ultrasound to evaluate for fibroids and because of the irregular vaginal bleeding. - US Transvaginal Non-OB; Future  4. Screen for STD (sexually transmitted disease) Strict condom use recommended. - Gono-Chlam - HIV antibody (with reflex) - RPR - Hepatitis B Surface AntiGEN - Hepatitis C Antibody  5. Trichomonas vaginalis infection Diagnosis and management reviewed with patient.  Will treat with Flagyl per mouth as described above.  Other orders - metroNIDAZOLE (FLAGYL) 500 MG tablet; Take 1 tablet (500 mg total) by mouth 2 (two) times daily for 7 days. - fluconazole (DIFLUCAN) 150 MG tablet; Take 1 tablet (150 mg total) by mouth daily for 3 days.  Bailey Hooper, it was a pleasure meeting you today!  I will inform you of your results as soon as available.   Trichomoniasis Trichomoniasis is an STI (sexually transmitted infection) that can affect both women and men. In women, the outer area of the female genitalia (vulva) and the vagina are affected. In men, the penis is mainly affected, but the prostate and other reproductive organs can also be involved. This condition can be treated with medicine. It often has no symptoms (is asymptomatic), especially in men. What are the causes? This condition  is caused by an organism called Trichomonas vaginalis. Trichomoniasis most often spreads from person to person (is contagious) through sexual contact. What increases the risk? The following factors may make you more likely to develop this condition:  Having unprotected sexual intercourse.  Having sexual intercourse with a partner who has trichomoniasis.  Having multiple sexual partners.  Having had previous trichomoniasis infections or other STIs.  What are the signs or symptoms? In women, symptoms of trichomoniasis include:  Abnormal vaginal discharge that is clear, white, gray, or yellow-green and foamy and has an unusual "fishy" odor.  Itching and irritation of the vagina and vulva.  Burning or pain during urination or sexual intercourse.  Genital redness and swelling.  In men, symptoms of trichomoniasis include:  Penile discharge that may be foamy or contain pus.  Pain in the penis. This may happen only when urinating.  Itching or irritation inside the penis.  Burning after urination or ejaculation.  How is this diagnosed? In women, this condition may be found during a routine Pap test or physical exam. It may be found in men during a routine physical exam. Your health care provider may perform tests to help diagnose this infection, such as:  Urine tests (men and women).  The following in women: ? Testing the pH of the vagina. ? A vaginal swab test that checks for the Trichomonas vaginalis organism. ? Testing vaginal secretions.  Your health care provider may test you for other STIs, including HIV (human immunodeficiency virus). How is this treated? This condition is treated with medicine taken by mouth (orally), such as metronidazole or tinidazole to fight the infection. Your  sexual partner(s) may also need to be tested and treated.  If you are a woman and you plan to become pregnant or think you may be pregnant, tell your health care provider right away. Some  medicines that are used to treat the infection should not be taken during pregnancy.  Your health care provider may recommend over-the-counter medicines or creams to help relieve itching or irritation. You may be tested for infection again 3 months after treatment. Follow these instructions at home:  Take and use over-the-counter and prescription medicines, including creams, only as told by your health care provider.  Do not have sexual intercourse until one week after you finish your medicine, or until your health care provider approves. Ask your health care provider when you may resume sexual intercourse.  (Women) Do not douche or wear tampons while you have the infection.  Discuss your infection with your sexual partner(s). Make sure that your partner gets tested and treated, if necessary.  Keep all follow-up visits as told by your health care provider. This is important. How is this prevented?  Use condoms every time you have sex. Using condoms correctly and consistently can help protect against STIs.  Avoid having multiple sexual partners.  Talk with your sexual partner about any symptoms that either of you may have, as well as any history of STIs.  Get tested for STIs and STDs (sexually transmitted diseases) before you have sex. Ask your partner to do the same.  Do not have sexual contact if you have symptoms of trichomoniasis or another STI. Contact a health care provider if:  You still have symptoms after you finish your medicine.  You develop pain in your abdomen.  You have pain when you urinate.  You have bleeding after sexual intercourse.  You develop a rash.  You feel nauseous or you vomit.  You plan to become pregnant or think you may be pregnant. Summary  Trichomoniasis is an STI (sexually transmitted infection) that can affect both women and men.  This condition often has no symptoms (is asymptomatic), especially in men.  You should not have sexual  intercourse until one week after you finish your medicine, or until your health care provider approves. Ask your health care provider when you may resume sexual intercourse.  Discuss your infection with your sexual partner. Make sure that your partner gets tested and treated, if necessary. This information is not intended to replace advice given to you by your health care provider. Make sure you discuss any questions you have with your health care provider. Document Released: 09/19/2000 Document Revised: 02/17/2016 Document Reviewed: 02/17/2016 Elsevier Interactive Patient Education  2017 [employer].

## 2017-04-10 ENCOUNTER — Encounter: Payer: Self-pay | Admitting: Internal Medicine

## 2017-05-08 NOTE — Telephone Encounter (Signed)
Done

## 2017-05-09 ENCOUNTER — Ambulatory Visit (INDEPENDENT_AMBULATORY_CARE_PROVIDER_SITE_OTHER): Payer: BLUE CROSS/BLUE SHIELD

## 2017-05-09 ENCOUNTER — Encounter: Payer: Self-pay | Admitting: Obstetrics & Gynecology

## 2017-05-09 ENCOUNTER — Ambulatory Visit (INDEPENDENT_AMBULATORY_CARE_PROVIDER_SITE_OTHER): Payer: BLUE CROSS/BLUE SHIELD | Admitting: Obstetrics & Gynecology

## 2017-05-09 VITALS — BP 140/90

## 2017-05-09 DIAGNOSIS — Z978 Presence of other specified devices: Secondary | ICD-10-CM

## 2017-05-09 DIAGNOSIS — D219 Benign neoplasm of connective and other soft tissue, unspecified: Secondary | ICD-10-CM | POA: Diagnosis not present

## 2017-05-09 DIAGNOSIS — Z975 Presence of (intrauterine) contraceptive device: Principal | ICD-10-CM

## 2017-05-09 DIAGNOSIS — N921 Excessive and frequent menstruation with irregular cycle: Secondary | ICD-10-CM

## 2017-05-09 DIAGNOSIS — F418 Other specified anxiety disorders: Secondary | ICD-10-CM

## 2017-05-09 DIAGNOSIS — N951 Menopausal and female climacteric states: Secondary | ICD-10-CM | POA: Diagnosis not present

## 2017-05-09 NOTE — Progress Notes (Signed)
    Bailey Hooper 03-26-1966 [id number]        52 y.o.  G2P0201   RP: BTB on Nexplanon for Pelvic US  HPI:  No current vaginal bleeding.  No pelvic pain.  C/O mood swings with mainly depressive symptoms and anxiety.  No suicidal ideation, but symptoms significantly interfering with her every day life.  Last visit 03/27/2017 we noted:   On Nexplanon since March 2016.  Several breakups with boyfriend.  Treated for bacterial vaginosis in July 2018 with Elon Alas.  Gonorrhea and Chlamydia negative at that time.  Started having mild breakthrough bleeding on Nexplanon for the past few months.  No pelvic pain.  Trichomonas vaginalis diagnosed at that visit and treated with Flagyl PO.  Full STI screen done,which came back negative.   OB History  Gravida Para Term Preterm AB Living  2 2   2   1   SAB TAB Ectopic Multiple Live Births      0        # Outcome Date GA Lbr Len/2nd Weight Sex Delivery Anes PTL Lv  2 Preterm           1 Preterm               Past medical history,surgical history, problem list, medications, allergies, family history and social history were all reviewed and documented in the EPIC chart.   Directed ROS with pertinent positives and negatives documented in the history of present illness/assessment and plan.  Exam:  Vitals:   05/09/17 1629  BP: 140/90   General appearance:  Normal  Pelvic US today: T/V images.  Uterus anteverted and heterogenous measuring 8.18 x 5.02 x 3.67 cm.  Endometrial lining thin measuring 2.2 mm.  Fluid in the uterine cavity measuring 1.1 x 0.2 cm.  Small intramural fibroids measuring 6 x 6 mm.  Myometrium with increased wall vascularity.  Right and left ovaries normal.  No apparent mass in the right and left adnexa.  No free fluid in the posterior cul-de-sac.   Assessment/Plan:  52 y.o. G2P0201   1. Breakthrough bleeding on Nexplanon Thin Endometrial line reassuring at 2.2 mm.  Very small amount of fluid in the intrauterine cavity.  Patient  reassured.  2. Perimenopause Will continue on Nexplanon at this time to protect the endometrium, stabilize her hormones and assure contraception.  3. Depression with anxiety After discussion of her symptoms and history, given that patient has had side effects, intolerance or no improvement on many different antidepressants, decision to refer to a psychiatrist to evaluate and manage.  Patient agrees with plan.  Counseling on above issues more than 50% for 25 minutes.  Princess Bruins MD, 4:39 PM 05/09/2017

## 2017-05-10 ENCOUNTER — Encounter: Payer: Self-pay | Admitting: Internal Medicine

## 2017-05-10 ENCOUNTER — Encounter: Payer: Self-pay | Admitting: Obstetrics & Gynecology

## 2017-05-10 NOTE — Patient Instructions (Signed)
1. Breakthrough bleeding on Nexplanon Thin Endometrial line reassuring at 2.2 mm.  Very small amount of fluid in the intrauterine cavity.  Patient reassured.  2. Perimenopause Will continue on Nexplanon at this time to protect the endometrium, stabilize her hormones and assure contraception.  3. Depression with anxiety After discussion of her symptoms and history, given that patient has had side effects, intolerance or no improvement on many different antidepressants, decision to refer to a psychiatrist to evaluate and manage.  Patient agrees with plan.  Ivin Booty, good seeing you today!

## 2017-05-14 ENCOUNTER — Telehealth: Payer: Self-pay | Admitting: *Deleted

## 2017-05-14 DIAGNOSIS — F3289 Other specified depressive episodes: Secondary | ICD-10-CM

## 2017-05-14 NOTE — Telephone Encounter (Signed)
-----   Message from Princess Bruins, MD sent at 05/10/2017  9:52 AM EST ----- Regarding: Refer to psychiatrist Many issues, in particular anxiety, depression.  Previously not tolerating AntiDepressants well.  Refer for evaluation and treatment.

## 2017-05-14 NOTE — Telephone Encounter (Signed)
Referral placed at [facility] outpatient behavioral health, as the [place] office in booked until April 2019. The office will call pt to schedule.

## 2017-05-15 MED ORDER — CLONAZEPAM 0.5 MG PO TABS: 0.5000 mg | ORAL_TABLET | Freq: Three times a day (TID) | ORAL | 0 refills | Status: DC | PRN

## 2017-05-15 NOTE — Telephone Encounter (Signed)
Medication called into pharmacy and left information on the provider voicemail. Blayn Whetsell,CMA

## 2017-05-18 ENCOUNTER — Encounter: Payer: Self-pay | Admitting: Obstetrics & Gynecology

## 2017-05-20 ENCOUNTER — Encounter: Payer: Self-pay | Admitting: Obstetrics & Gynecology

## 2017-05-23 NOTE — Telephone Encounter (Signed)
[place] has left message for pt to call.

## 2017-05-25 ENCOUNTER — Other Ambulatory Visit: Payer: Self-pay | Admitting: Internal Medicine

## 2017-05-30 NOTE — Telephone Encounter (Signed)
Scheduled w/ Dr. Modesta Messing 07/04/17

## 2017-06-01 ENCOUNTER — Ambulatory Visit (HOSPITAL_COMMUNITY): Payer: BLUE CROSS/BLUE SHIELD | Admitting: Psychiatry

## 2017-06-15 ENCOUNTER — Ambulatory Visit (HOSPITAL_COMMUNITY): Payer: BLUE CROSS/BLUE SHIELD | Admitting: Psychiatry

## 2017-06-15 ENCOUNTER — Encounter (HOSPITAL_COMMUNITY): Payer: Self-pay | Admitting: Psychiatry

## 2017-06-15 VITALS — BP 140/94 | Ht 66.0 in | Wt 170.0 lb

## 2017-06-15 DIAGNOSIS — F17210 Nicotine dependence, cigarettes, uncomplicated: Secondary | ICD-10-CM

## 2017-06-15 DIAGNOSIS — Z818 Family history of other mental and behavioral disorders: Secondary | ICD-10-CM | POA: Diagnosis not present

## 2017-06-15 DIAGNOSIS — F0630 Mood disorder due to known physiological condition, unspecified: Secondary | ICD-10-CM

## 2017-06-15 DIAGNOSIS — F331 Major depressive disorder, recurrent, moderate: Secondary | ICD-10-CM

## 2017-06-15 DIAGNOSIS — F411 Generalized anxiety disorder: Secondary | ICD-10-CM | POA: Diagnosis not present

## 2017-06-15 DIAGNOSIS — R450 Nervousness: Secondary | ICD-10-CM

## 2017-06-15 DIAGNOSIS — Z5689 Other problems related to employment: Secondary | ICD-10-CM

## 2017-06-15 DIAGNOSIS — G4700 Insomnia, unspecified: Secondary | ICD-10-CM | POA: Diagnosis not present

## 2017-06-15 MED ORDER — BUSPIRONE HCL 10 MG PO TABS: ORAL_TABLET | ORAL | 0 refills | Status: DC

## 2017-06-15 MED ORDER — LAMOTRIGINE 25 MG PO TABS: 25.0000 mg | ORAL_TABLET | Freq: Every day | ORAL | 0 refills | Status: DC

## 2017-06-15 MED ORDER — MIRTAZAPINE 15 MG PO TBDP: 15.0000 mg | ORAL_TABLET | Freq: Every day | ORAL | 0 refills | Status: DC

## 2017-06-15 MED ORDER — MIRTAZAPINE 15 MG PO TABS: 15.0000 mg | ORAL_TABLET | Freq: Every day | ORAL | 0 refills | Status: DC

## 2017-06-15 NOTE — Progress Notes (Signed)
Psychiatric Initial Adult Assessment   Patient Identification: Bailey Hooper MRN:  [id number] Date of Evaluation:  06/15/2017 Referral Source: [place], [place] Chief Complaint:   Chief Complaint    Establish Care; Depression     Visit Diagnosis:    ICD-10-CM   1. Major depressive disorder, recurrent episode, moderate (HCC) F33.1   2. Generalized anxiety disorder F41.1   3. Mood disorder in conditions classified elsewhere F06.30     History of Present Illness:  52 years old [name] , single referred for management of depression and anxiety  Patient has been referred because she has been on different multiple medications in the past for depression and anxiety and they have seemed to not work recently she is taking BuSpar and Klonopin when necessary. She has been on Wellbutrin, Prozac, Zoloft, Abilify and some other medication that she is not aware or does not know the name as of now.  Her stress level is related to her daughter was living with her she is 66 years of age but she is getting more dependent on her. 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 She wants to get things done twice to encourage her to think positively. She also is stressed because his two-bedroom apartment and her daughter is living with her.  She also endorses anxiety, worries, excessive worries and she worries about her physical health she worries mostly about her mental health and her depression because she began different medications but she does understand there are a lot of external stressors including her living situation and  job stress that may be contributing to her depression as well. She has episodes of sometimes feeling happy or euphoric for a few hours a day and then she goes into depression does note particular manic symptoms or psychotic symptoms she does get anxious around people sometimes has social anxiety.   Duration of depression since her 20"s : not felt she was given due attention by mom when young as well Severity : 5/10. 10 being no depression Modifying Factor: tries to get busy. Family Aggravating F: job stress. Small home. Daughter lives with her    Associated Signs/Symptoms: Depression Symptoms:  anhedonia, fatigue, difficulty concentrating, (Hypo) Manic Symptoms:  Distractibility, Anxiety Symptoms:  Excessive Worry, Psychotic Symptoms:  denies PTSD Symptoms: NA  Past Psychiatric History: depression, anxiety  No admission  Previous Psychotropic Medications: Yes   Substance Abuse History in the last 12 months:  No.  Consequences of Substance Abuse: NA  Past Medical History:  Past Medical History:  Diagnosis Date  . Anxiety   . Asthma   . Depression   . GERD (gastroesophageal reflux disease)   . High cholesterol   . Hypertension   . IBS (irritable bowel syndrome)   . Migraines   . Sleep apnea     Past Surgical History:  Procedure Laterality Date  . CESAREAN SECTION    . FOOT SURGERY     Left foot    Family Psychiatric History: Aunt : schizophrenia or bipolar   Family History:  Family History  Problem Relation Age of Onset  . Colon  cancer Unknown        Father  . Cancer Unknown        Oral, uncle  . Alcohol abuse Unknown        Father  . Drug abuse Unknown        Father  . Osteoporosis Unknown   . Depression Unknown   . Hypertension Unknown   . Diabetes Unknown   . Heart disease Unknown        Grandmother  . Colon cancer Father   . Esophageal cancer Neg Hx     Social History:   Social History   Socioeconomic History  . Marital status: Divorced     Spouse name: None  . Number of children: None  . Years of education: None  . Highest education level: None  Social Needs  . Financial resource strain: None  . Food insecurity - worry: None  . Food insecurity - inability: None  . Transportation needs - medical: None  . Transportation needs - non-medical: None  Occupational History  . None  Tobacco Use  . Smoking status: Current Every Day Smoker    Packs/day: 0.25    Years: 29.00    Pack years: 7.25    Types: Cigarettes    Start date: 04/09/1986  . Smokeless tobacco: Never Used  . Tobacco comment: Previous 0.5 PPD. trying to quitt. Does not smoke when sick  Substance and Sexual Activity  . Alcohol use: Yes    Alcohol/week: 0.0 oz  . Drug use: No  . Sexual activity: Yes    Birth control/protection: Implant  Other Topics Concern  . 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 Not got much attention. Many half siblings. She felt misfit or not sure of her at times Married for 5 years but he got into pain meds after surgery and didn't understand her depression   Allergies:   Allergies  Allergen Reactions  . Lisinopril Palpitations and Cough    Per pt report  . Penicillins Rash    Vesicles, denies SOB or breathing issues  . Erythromycin Nausea And Vomiting    Metabolic Disorder Labs: Lab Results  Component Value Date   HGBA1C 5.9 06/11/2016   No results found for: PROLACTIN Lab Results  Component Value Date   CHOL 151 06/11/2016   TRIG 76 06/11/2016   HDL 46 (L) 06/11/2016   CHOLHDL 3.3 06/11/2016   VLDL 15 06/11/2016   LDLCALC 90 06/11/2016   LDLCALC 90 03/11/2015     Current Medications: Current Outpatient Medications  Medication Sig Dispense Refill  . albuterol (PROVENTIL HFA;VENTOLIN HFA) 108 (90 BASE) MCG/ACT inhaler  Inhale 2 puffs into the lungs every 6 (six) hours as needed for wheezing.     Marland Kitchen amLODipine (NORVASC) 5 MG tablet TAKE 2 TABLETS BY MOUTH EVERY DAY 60 tablet 5  . amoxicillin (AMOXIL) 250 MG capsule Take 1 capsule (250 mg total) by mouth 3 (three) times daily. 30 capsule 0  . azelastine (OPTIVAR) 0.05 % ophthalmic solution Place 1 drop into both eyes 2 (two) times daily. 6 mL 12  . busPIRone (BUSPAR) 10 MG tablet Take twice a day 60 tablet 0  . chlorhexidine (HIBICLENS) 4 % external liquid Apply topically daily as needed. 120 mL 3  .  clonazePAM (KLONOPIN) 0.5 MG tablet Take 1 tablet (0.5 mg total) by mouth 3 (three) times daily as needed. for anxiety 30 tablet 0  . Dentifrices (BIOTENE DRY MOUTH CARE DT) Place onto teeth as needed.    . etonogestrel (IMPLANON) 68 MG IMPL implant 1 each by Subdermal route once.    . fluocinonide cream (LIDEX) 1.61 % Apply 1 application topically 2 (two) times daily.    . hydrocortisone 2.5 % cream Apply topically 2 (two) times daily. 30 g 0  . ipratropium (ATROVENT) 0.03 % nasal spray Place 2 sprays into both nostrils every 12 (twelve) hours. 30 mL 1  . ketoconazole (NIZORAL) 2 % cream Apply 1 application topically daily.    Marland Kitchen lamoTRIgine (LAMICTAL) 25 MG tablet Take 1 tablet (25 mg total) by mouth daily. Take one tablet daily for a week and then start taking 2 tablets. 60 tablet 0  . losartan (COZAAR) 50 MG tablet TAKE 1 TABLET BY MOUTH EVERY DAY 90 tablet 0  . Melatonin 1 MG CAPS Take by mouth.    . mirtazapine (REMERON) 15 MG tablet Take 1 tablet (15 mg total) by mouth at bedtime. Take half a tablet at night for first 3 nights and then one a night 30 tablet 0  . montelukast (SINGULAIR) 10 MG tablet Take 1 tablet (10 mg total) by mouth at bedtime. 30 tablet 5  . Multiple Vitamin (MULTIVITAMIN) capsule Take 1 capsule by mouth daily.    . mupirocin cream (BACTROBAN) 2 % Apply 1 application topically 2 (two) times daily. 15 g 0  . nystatin (MYCOSTATIN/NYSTOP) 100000  UNIT/GM POWD Apply to irritated vulvar area (not mucous membranes) twice daily for 1 week 30 g 1  . omeprazole (PRILOSEC) 40 MG capsule TAKE 1 CAPSULE BY MOUTH DAILY AT LEAST 30 MINS BEFORE BREAKFAST 30 capsule 0  . ranitidine (ZANTAC) 150 MG tablet Take 1 tablet (150 mg total) by mouth at bedtime. 30 tablet 2  . tamsulosin (FLOMAX) 0.4 MG CAPS capsule Take 1 capsule (0.4 mg total) by mouth daily. 30 capsule 3  . triamcinolone (KENALOG) 0.025 % ointment Apply 1 application topically 2 (two) times daily.     Current Facility-Administered Medications  Medication Dose Route Frequency Provider Last Rate Last Dose  . 0.9 %  sodium chloride infusion  500 mL Intravenous Continuous Nandigam, Venia Minks, MD        Neurologic: Headache: No Seizure: No Paresthesias:No  Musculoskeletal: Strength & Muscle Tone: within normal limits Gait & Station: normal Patient leans: no lean  Psychiatric Specialty Exam: Review of Systems  Cardiovascular: Negative for chest pain.  Skin: Negative for rash.  Psychiatric/Behavioral: Positive for depression. Negative for hallucinations and substance abuse. The patient is nervous/anxious.     Height 5\' 6"  (1.676 m), weight 170 lb (77.1 kg).Body mass index is 27.44 kg/m.  General Appearance: Casual  Eye Contact:  Fair  Speech:  Slow  Volume:  Normal  Mood:  Dysphoric  Affect:  Constricted  Thought Process:  Goal Directed  Orientation:  Full (Time, Place, and Person)  Thought Content:  Rumination  Suicidal Thoughts:  No  Homicidal Thoughts:  No  Memory:  Immediate;   Fair Recent;   Fair  Judgement:  Fair  Insight:  Fair  Psychomotor Activity:  Normal  Concentration:  Concentration: Fair and Attention Span: Fair  Recall:  [employer] of Knowledge:Fair  Language: Fair  Akathisia:  No  Handed:  Right  AIMS (if indicated):    Assets:  Desire for Improvement  ADL's:  Intact  Cognition: WNL  Sleep:  Variable to poor    Treatment Plan  Summary: Medication management and Plan as follows  1. MDD , recurrent moderate; has been on multiple meds before. Loosing weight , decreased apetite and sleep is poor. Will consider remeron increase to 15mg  in one week  Can hold off lamictal was considered. Also trintellix may be an option down the road  2. GAD: increase buspar to bid. It does help but feels not enough. Avoid klonopine on regular basis 3. Mood disorder: relavant to multiple factors. Not manic. Can consider lamictal if remeron doesn't help 4. Insomnia; reviewed sleep hygiene. Start cpap regular use. Start remeron as above 5. Forgetfullness: may be part of depression, anxiety and sleep apnea.  Will initiate above treatment for now No chest pain. hasnt taken her blood pressure med. Advised to take and report if any concerns More than 50% time spent in counseling and coordination of care including patient education and review side effects and concerns were addressed FU with Provider at [facility] clinic here in 4 weeks. Consider therapy if needed   Merian Capron, MD 3/9/20191:49 PM

## 2017-06-19 NOTE — Progress Notes (Deleted)
BH MD/PA/NP OP Progress Note  06/19/2017 3:43 PM Bailey Hooper  MRN:  [id number]  Chief Complaint:  HPI:  Bailey Hooper is a 52 y.o. year old female with a history of depression, GAD, hypertension, migraine, GERD,   who presents for follow up appointment for No diagnosis found.  Mirtazapine uptitrated  taking BuSpar and Klonopin when necessary. She has been on Wellbutrin, Prozac, Zoloft, Abilify and some other medication that she is not aware or does not know the name as of now.    Visit Diagnosis: No diagnosis found.  Past Psychiatric History:  I have reviewed the patient's psychiatry history in detail and updated the patient record.  Past Medical History:  Past Medical History:  Diagnosis Date  . Anxiety   . Asthma   . Depression   . GERD (gastroesophageal reflux disease)   . High cholesterol   . Hypertension   . IBS (irritable bowel syndrome)   . Migraines   . Sleep apnea     Past Surgical History:  Procedure Laterality Date  . CESAREAN SECTION    . FOOT SURGERY     Left foot    Family Psychiatric History:  I have reviewed the patient's family history in detail and updated the patient record.  Family History:  Family History  Problem Relation Age of Onset  . Colon cancer Unknown        Father  . Cancer Unknown        Oral, uncle  . Alcohol abuse Unknown        Father  . Drug abuse Unknown        Father  . Osteoporosis Unknown   . Depression Unknown   . Hypertension Unknown   . Diabetes Unknown   . Heart disease Unknown        Grandmother  . Colon cancer Father   . Esophageal cancer Neg Hx     Social History:  Social History   Socioeconomic History  . Marital status: Divorced    Spouse name: Not on file  . Number of children: Not on file  . Years of education: Not on file  . Highest education level: Not on file  Social Needs  . Financial resource strain: Not on file  . Food insecurity - worry: Not on file  . Food insecurity - inability: Not on  file  . Transportation needs - medical: Not on file  . Transportation needs - non-medical: Not on file  Occupational History  . Not on file  Tobacco Use  . Smoking status: Current Every Day Smoker    Packs/day: 0.25    Years: 29.00    Pack years: 7.25    Types: Cigarettes    Start date: 04/09/1986  . Smokeless tobacco: Never Used  . Tobacco comment: Previous 0.5 PPD. trying to quitt. Does not smoke when sick  Substance and Sexual Activity  . Alcohol use: Yes    Alcohol/week: 0.0 oz  . Drug use: No  . Sexual activity: Yes    Birth control/protection: Implant  Other Topics Concern  . 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 Lisinopril Palpitations and Cough    Per pt report  . Penicillins Rash    Vesicles, denies SOB or breathing issues  .  Erythromycin Nausea And Vomiting    Metabolic Disorder Labs: Lab Results  Component Value Date   HGBA1C 5.9 06/11/2016   No results found for: PROLACTIN Lab Results  Component Value Date   CHOL 151 06/11/2016   TRIG 76 06/11/2016   HDL 46 (L) 06/11/2016   CHOLHDL 3.3 06/11/2016   VLDL 15 06/11/2016   LDLCALC 90 06/11/2016   LDLCALC 90 03/11/2015   Lab Results  Component Value Date   TSH 0.594 03/13/2017   TSH 0.617 06/16/2010    Therapeutic Level Labs: No results found for: LITHIUM No results found for: VALPROATE No components found for:  CBMZ  Current Medications: Current Outpatient Medications  Medication Sig Dispense Refill  . albuterol (PROVENTIL HFA;VENTOLIN HFA) 108 (90 BASE) MCG/ACT inhaler Inhale 2 puffs into the lungs every 6 (six) hours as needed for wheezing.     Marland Kitchen amLODipine (NORVASC) 5 MG tablet TAKE 2 TABLETS BY MOUTH EVERY DAY 60 tablet 5  . amoxicillin (AMOXIL) 250 MG capsule Take 1 capsule (250 mg total) by mouth 3 (three) times  daily. 30 capsule 0  . azelastine (OPTIVAR) 0.05 % ophthalmic solution Place 1 drop into both eyes 2 (two) times daily. 6 mL 12  . busPIRone (BUSPAR) 10 MG tablet Take twice a day 60 tablet 0  . chlorhexidine (HIBICLENS) 4 % external liquid Apply topically daily as needed. 120 mL 3  . clonazePAM (KLONOPIN) 0.5 MG tablet Take 1 tablet (0.5 mg total) by mouth 3 (three) times daily as needed. for anxiety 30 tablet 0  . Dentifrices (BIOTENE DRY MOUTH CARE DT) Place onto teeth as needed.    . etonogestrel (IMPLANON) 68 MG IMPL implant 1 each by Subdermal route once.    . fluocinonide cream (LIDEX) 6.96 % Apply 1 application topically 2 (two) times daily.    . hydrocortisone 2.5 % cream Apply topically 2 (two) times daily. 30 g 0  . ipratropium (ATROVENT) 0.03 % nasal spray Place 2 sprays into both nostrils every 12 (twelve) hours. 30 mL 1  . ketoconazole (NIZORAL) 2 % cream Apply 1 application topically daily.    Marland Kitchen lamoTRIgine (LAMICTAL) 25 MG tablet Take 1 tablet (25 mg total) by mouth daily. Take one tablet daily for a week and then start taking 2 tablets. 60 tablet 0  . losartan (COZAAR) 50 MG tablet TAKE 1 TABLET BY MOUTH EVERY DAY 90 tablet 0  . Melatonin 1 MG CAPS Take by mouth.    . mirtazapine (REMERON) 15 MG tablet Take 1 tablet (15 mg total) by mouth at bedtime. Take half a tablet at night for first 3 nights and then one a night 30 tablet 0  . montelukast (SINGULAIR) 10 MG tablet Take 1 tablet (10 mg total) by mouth at bedtime. 30 tablet 5  . Multiple Vitamin (MULTIVITAMIN) capsule Take 1 capsule by mouth daily.    . mupirocin cream (BACTROBAN) 2 % Apply 1 application topically 2 (two) times daily. 15 g 0  . nystatin (MYCOSTATIN/NYSTOP) 100000 UNIT/GM POWD Apply to irritated vulvar area (not mucous membranes) twice daily for 1 week 30 g 1  . omeprazole (PRILOSEC) 40 MG capsule TAKE 1 CAPSULE BY MOUTH DAILY AT LEAST 30 MINS BEFORE BREAKFAST 30 capsule 0  . ranitidine (ZANTAC) 150 MG tablet  Take 1 tablet (150 mg total) by mouth at bedtime. 30 tablet 2  . tamsulosin (FLOMAX) 0.4 MG CAPS capsule Take 1 capsule (0.4 mg total) by mouth daily. 30 capsule 3  . triamcinolone (  KENALOG) 0.025 % ointment Apply 1 application topically 2 (two) times daily.     Current Facility-Administered Medications  Medication Dose Route Frequency Provider Last Rate Last Dose  . 0.9 %  sodium chloride infusion  500 mL Intravenous Continuous Nandigam, Venia Minks, MD         Musculoskeletal: Strength & Muscle Tone: within normal limits Gait & Station: normal Patient leans: N/A  Psychiatric Specialty Exam: ROS  There were no vitals taken for this visit.There is no height or weight on file to calculate BMI.  General Appearance: Fairly Groomed  Eye Contact:  Good  Speech:  Clear and Coherent  Volume:  Normal  Mood:  {BHH MOOD:22306}  Affect:  {Affect (PAA):22687}  Thought Process:  Coherent  Orientation:  Full (Time, Place, and Person)  Thought Content: Logical   Suicidal Thoughts:  {ST/HT (PAA):22692}  Homicidal Thoughts:  {ST/HT (PAA):22692}  Memory:  Immediate;   Good  Judgement:  {Judgement (PAA):22694}  Insight:  {Insight (PAA):22695}  Psychomotor Activity:  Normal  Concentration:  Concentration: Good and Attention Span: Good  Recall:  Good  Fund of Knowledge: Good  Language: Good  Akathisia:  No  Handed:  Right  AIMS (if indicated): not done  Assets:  Communication Skills Desire for Improvement  ADL's:  Intact  Cognition: WNL  Sleep:  {BHH GOOD/FAIR/POOR:22877}   Screenings: PHQ2-9     Office Visit from 02/27/2017 in [place] from 12/07/2016 in [place] Office Visit from 06/11/2016 in [place] Office Visit from 04/27/2016 in [place] Office Visit from 09/20/2015 in [place]  PHQ-2 Total Score  0  0  0  0  0       Assessment and Plan:   Assessment  Plan  The patient demonstrates the following risk factors for suicide: Chronic risk factors for suicide include: {Chronic Risk Factors for GDJMEQA:83419622}. Acute risk factors for suicide include: {Acute Risk Factors for WLNLGXQ:11941740}. Protective factors for this patient include: {Protective Factors for Suicide CXKG:81856314}. Considering these factors, the overall suicide risk at this point appears to be {Desc; low/moderate/high:110033}. Patient {ACTION; IS/IS HFW:26378588} appropriate for outpatient follow up.     Norman Clay, MD 06/19/2017, 3:43 PM

## 2017-06-22 ENCOUNTER — Ambulatory Visit (HOSPITAL_COMMUNITY): Payer: Self-pay | Admitting: Psychiatry

## 2017-07-01 ENCOUNTER — Encounter: Payer: Self-pay | Admitting: Obstetrics & Gynecology

## 2017-07-01 ENCOUNTER — Ambulatory Visit: Payer: BLUE CROSS/BLUE SHIELD | Admitting: Obstetrics & Gynecology

## 2017-07-01 ENCOUNTER — Encounter: Payer: Self-pay | Admitting: Internal Medicine

## 2017-07-01 VITALS — BP 146/88 | Ht 66.75 in | Wt 176.0 lb

## 2017-07-01 DIAGNOSIS — Z1151 Encounter for screening for human papillomavirus (HPV): Secondary | ICD-10-CM

## 2017-07-01 DIAGNOSIS — L292 Pruritus vulvae: Secondary | ICD-10-CM | POA: Diagnosis not present

## 2017-07-01 DIAGNOSIS — Z01419 Encounter for gynecological examination (general) (routine) without abnormal findings: Secondary | ICD-10-CM | POA: Diagnosis not present

## 2017-07-01 DIAGNOSIS — N951 Menopausal and female climacteric states: Secondary | ICD-10-CM

## 2017-07-01 DIAGNOSIS — Z113 Encounter for screening for infections with a predominantly sexual mode of transmission: Secondary | ICD-10-CM

## 2017-07-01 DIAGNOSIS — Z3046 Encounter for surveillance of implantable subdermal contraceptive: Secondary | ICD-10-CM

## 2017-07-01 LAB — WET PREP FOR TRICH, YEAST, CLUE

## 2017-07-01 MED ORDER — FLUCONAZOLE 150 MG PO TABS: 150.0000 mg | ORAL_TABLET | Freq: Every day | ORAL | 1 refills | Status: DC

## 2017-07-01 NOTE — Progress Notes (Signed)
Bailey Hooper Jul 16, 1965 [id number]   History:    52 y.o. G2P2L1 Boyfriend  RP: Established patient presenting for annual gyn exam   HPI: Had breakthrough bleeding on Nexplanon, seen for that reason in January 2019.  At that visit the pelvic ultrasound was reassuring with a thin endometrial line at 2.2 mm.  Breakthrough bleeding better now but patient is due for changing her Nexplanon as it was inserted in March 2016.  Will use condoms in the meantime if sexually active.  Treated for trichomonas in 2018.  STD screening otherwise negative.  Mild hot flushes. No pelvic pain.  Normal vaginal secretions, but vulvar itching present.  Urine and bowel movements normal.  Breasts normal.  Health labs with family physician.  Colonoscopy in 2018.  Depressive symptoms improved.  Patient referred to psychiatry.  No suicidal ideation.  Past medical history,surgical history, family history and social history were all reviewed and documented in the EPIC chart.  Gynecologic History No LMP recorded. Patient has had an implant. Contraception: Nexplanon x 06/2014 Last Pap: 01/2015. Results were: Negative Last mammogram: 03/2015. Results were: Negative Bone Density: Never Colonoscopy: 2018  Obstetric History OB History  Gravida Para Term Preterm AB Living  2 2   2   1   SAB TAB Ectopic Multiple Live Births      0        # Outcome Date GA Lbr Len/2nd Weight Sex Delivery Anes PTL Lv  2 Preterm           1 Preterm              ROS: A ROS was performed and pertinent positives and negatives are included in the history.  GENERAL: No fevers or chills. HEENT: No change in vision, no earache, sore throat or sinus congestion. NECK: No pain or stiffness. CARDIOVASCULAR: No chest pain or pressure. No palpitations. PULMONARY: No shortness of breath, cough or wheeze. GASTROINTESTINAL: No abdominal pain, nausea, vomiting or diarrhea, melena or bright red blood per rectum. GENITOURINARY: No urinary frequency, urgency,  hesitancy or dysuria. MUSCULOSKELETAL: No joint or muscle pain, no back pain, no recent trauma. DERMATOLOGIC: No rash, no itching, no lesions. ENDOCRINE: No polyuria, polydipsia, no heat or cold intolerance. No recent change in weight. HEMATOLOGICAL: No anemia or easy bruising or bleeding. NEUROLOGIC: No headache, seizures, numbness, tingling or weakness. PSYCHIATRIC: No depression, no loss of interest in normal activity or change in sleep pattern.     Exam:   BP (!) 146/88   Ht 5' 6.75" (1.695 m)   Wt 176 lb (79.8 kg)   BMI 27.77 kg/m   Body mass index is 27.77 kg/m.  General appearance : Well developed well nourished female. No acute distress HEENT: Eyes: no retinal hemorrhage or exudates,  Neck supple, trachea midline, no carotid bruits, no thyroidmegaly Lungs: Clear to auscultation, no rhonchi or wheezes, or rib retractions  Heart: Regular rate and rhythm, no murmurs or gallops Breast:Examined in sitting and supine position were symmetrical in appearance, no palpable masses or tenderness,  no skin retraction, no nipple inversion, no nipple discharge, no skin discoloration, no axillary or supraclavicular lymphadenopathy Abdomen: no palpable masses or tenderness, no rebound or guarding Extremities: no edema or skin discoloration or tenderness  Pelvic: Vulva: Normal             Vagina: No gross lesions or discharge  Cervix: No gross lesions.  Mild discharge.  Pap/HR HPV, Gono-Chlam done.  Wet prep done.  Uterus  AV,  normal size, shape and consistency, non-tender and mobile  Adnexa  Without masses or tenderness  Anus: Normal   Assessment/Plan:  52 y.o. female for annual exam   1. Encounter for routine gynecological examination with Papanicolaou smear of cervix Normal gynecologic exam.  Pap with high risk HPV done.  Breast exam normal.  Will schedule screening mammogram now.  Health labs with family physician.  2. Encounter for surveillance of implantable subdermal  contraceptive Nexplanon x 06/2014.  F/U ASAP for removal/insertion of Nexplanon.  Condoms if sexually active.  3. Screen for STD (sexually transmitted disease) Strict condom use recommended - HIV antibody (with reflex) - RPR - Hepatitis B Surface AntiGEN - Hepatitis C Antibody - Gono-Chlam on pap  4. Hot flushes, perimenopausal Probably entering menopause.  Will check her [place] level today.  Even if elevated, will follow-up as soon as possible to remove and insert a Nexplanon. - F[facility]  5. Vulvar itching Wet prep confirming a yeast vaginitis.  Treat with fluconazole 1 tablet per mouth daily for 3 days.  Recommend probiotic after finishing the treatment to decrease the risk of recurrence. - WET PREP FOR [place], YEAST, CLUE  Other orders - fluconazole (DIFLUCAN) 150 MG tablet; Take 1 tablet (150 mg total) by mouth daily for 3 days.  Counseling on above issues and coordination of care more than 50% for 10 minutes.  Princess Bruins MD, 2:53 PM 07/01/2017

## 2017-07-02 LAB — RPR: RPR Ser Ql: NONREACTIVE

## 2017-07-02 LAB — FOLLICLE STIMULATING HORMONE: FSH: 39.4 m[IU]/mL

## 2017-07-02 LAB — HIV ANTIBODY (ROUTINE TESTING W REFLEX): HIV: NONREACTIVE

## 2017-07-02 LAB — HEPATITIS B SURFACE ANTIGEN: HEP B S AG: NONREACTIVE

## 2017-07-02 LAB — HEPATITIS C ANTIBODY
HEP C AB: NONREACTIVE
SIGNAL TO CUT-OFF: 0.04 (ref <1.00)

## 2017-07-03 LAB — PAP IG, CT-NG NAA, HPV HIGH-RISK
C. TRACHOMATIS RNA, TMA: NOT DETECTED
HPV DNA High Risk: NOT DETECTED
N. gonorrhoeae RNA, TMA: NOT DETECTED

## 2017-07-04 ENCOUNTER — Other Ambulatory Visit: Payer: Self-pay | Admitting: Internal Medicine

## 2017-07-04 ENCOUNTER — Ambulatory Visit (HOSPITAL_COMMUNITY): Payer: BLUE CROSS/BLUE SHIELD | Admitting: Psychiatry

## 2017-07-04 ENCOUNTER — Encounter: Payer: Self-pay | Admitting: Obstetrics & Gynecology

## 2017-07-04 MED ORDER — AZELASTINE HCL 0.05 % OP SOLN: 1.0000 [drp] | Freq: Two times a day (BID) | OPHTHALMIC | 12 refills | Status: DC

## 2017-07-06 ENCOUNTER — Encounter: Payer: Self-pay | Admitting: Obstetrics & Gynecology

## 2017-07-06 NOTE — Patient Instructions (Signed)
1. Encounter for routine gynecological examination with Papanicolaou smear of cervix Normal gynecologic exam.  Pap with high risk HPV done.  Breast exam normal.  Will schedule screening mammogram now.  Health labs with family physician.  2. Encounter for surveillance of implantable subdermal contraceptive Nexplanon x 06/2014.  F/U ASAP for removal/insertion of Nexplanon.  Condoms if sexually active.  3. Screen for STD (sexually transmitted disease) Strict condom use recommended - HIV antibody (with reflex) - RPR - Hepatitis B Surface AntiGEN - Hepatitis C Antibody - Gono-Chlam on pap  4. Hot flushes, perimenopausal Probably entering menopause.  Will check her [place] level today.  Even if elevated, will follow-up as soon as possible to remove and insert a Nexplanon. - [facility]  5. Vulvar itching Wet prep confirming a yeast vaginitis.  Treat with fluconazole 1 tablet per mouth daily for 3 days.  Recommend probiotic after finishing the treatment to decrease the risk of recurrence. - WET PREP FOR [place], YEAST, CLUE  Other orders - fluconazole (DIFLUCAN) 150 MG tablet; Take 1 tablet (150 mg total) by mouth daily for 3 days.  Bailey Hooper, it was a pleasure seeing you today!  I will inform you of your results as soon as they are available.   Health Maintenance for Postmenopausal Women Menopause is a normal process in which your reproductive ability comes to an end. This process happens gradually over a span of months to years, usually between the ages of 19 and 94. Menopause is complete when you have missed 12 consecutive menstrual periods. It is important to talk with your health care provider about some of the most common conditions that affect postmenopausal women, such as heart disease, cancer, and bone loss (osteoporosis). Adopting a healthy lifestyle and getting preventive care can help to promote your health and wellness. Those actions can also lower your chances of developing some of these common  conditions. What should I know about menopause? During menopause, you may experience a number of symptoms, such as:  Moderate-to-severe hot flashes.  Night sweats.  Decrease in sex drive.  Mood swings.  Headaches.  Tiredness.  Irritability.  Memory problems.  Insomnia.  Choosing to treat or not to treat menopausal changes is an individual decision that you make with your health care provider. What should I know about hormone replacement therapy and supplements? Hormone therapy products are effective for treating symptoms that are associated with menopause, such as hot flashes and night sweats. Hormone replacement carries certain risks, especially as you become older. If you are thinking about using estrogen or estrogen with progestin treatments, discuss the benefits and risks with your health care provider. What should I know about heart disease and stroke? Heart disease, heart attack, and stroke become more likely as you age. This may be due, in part, to the hormonal changes that your body experiences during menopause. These can affect how your body processes dietary fats, triglycerides, and cholesterol. Heart attack and stroke are both medical emergencies. There are many things that you can do to help prevent heart disease and stroke:  Have your blood pressure checked at least every 1-2 years. High blood pressure causes heart disease and increases the risk of stroke.  If you are 41-48 years old, ask your health care provider if you should take aspirin to prevent a heart attack or a stroke.  Do not use any tobacco products, including cigarettes, chewing tobacco, or electronic cigarettes. If you need help quitting, ask your health care provider.  It is important to  eat a healthy diet and maintain a healthy weight. ? Be sure to include plenty of vegetables, fruits, low-fat dairy products, and lean protein. ? Avoid eating foods that are high in solid fats, added sugars, or salt  (sodium).  Get regular exercise. This is one of the most important things that you can do for your health. ? Try to exercise for at least 150 minutes each week. The type of exercise that you do should increase your heart rate and make you sweat. This is known as moderate-intensity exercise. ? Try to do strengthening exercises at least twice each week. Do these in addition to the moderate-intensity exercise.  Know your numbers.Ask your health care provider to check your cholesterol and your blood glucose. Continue to have your blood tested as directed by your health care provider.  What should I know about cancer screening? There are several types of cancer. Take the following steps to reduce your risk and to catch any cancer development as early as possible. Breast Cancer  Practice breast self-awareness. ? This means understanding how your breasts normally appear and feel. ? It also means doing regular breast self-exams. Let your health care provider know about any changes, no matter how small.  If you are 33 or older, have a clinician do a breast exam (clinical breast exam or CBE) every year. Depending on your age, family history, and medical history, it may be recommended that you also have a yearly breast X-ray (mammogram).  If you have a family history of breast cancer, talk with your health care provider about genetic screening.  If you are at high risk for breast cancer, talk with your health care provider about having an MRI and a mammogram every year.  Breast cancer (BRCA) gene test is recommended for women who have family members with BRCA-related cancers. Results of the assessment will determine the need for genetic counseling and BRCA1 and for BRCA2 testing. BRCA-related cancers include these types: ? Breast. This occurs in males or females. ? Ovarian. ? Tubal. This may also be called fallopian tube cancer. ? Cancer of the abdominal or pelvic lining (peritoneal  cancer). ? Prostate. ? Pancreatic.  Cervical, Uterine, and Ovarian Cancer Your health care provider may recommend that you be screened regularly for cancer of the pelvic organs. These include your ovaries, uterus, and vagina. This screening involves a pelvic exam, which includes checking for microscopic changes to the surface of your cervix (Pap test).  For women ages 21-65, health care providers may recommend a pelvic exam and a Pap test every three years. For women ages 64-65, they may recommend the Pap test and pelvic exam, combined with testing for human papilloma virus (HPV), every five years. Some types of HPV increase your risk of cervical cancer. Testing for HPV may also be done on women of any age who have unclear Pap test results.  Other health care providers may not recommend any screening for nonpregnant women who are considered low risk for pelvic cancer and have no symptoms. Ask your health care provider if a screening pelvic exam is right for you.  If you have had past treatment for cervical cancer or a condition that could lead to cancer, you need Pap tests and screening for cancer for at least 20 years after your treatment. If Pap tests have been discontinued for you, your risk factors (such as having a new sexual partner) need to be reassessed to determine if you should start having screenings again. Some women have  medical problems that increase the chance of getting cervical cancer. In these cases, your health care provider may recommend that you have screening and Pap tests more often.  If you have a family history of uterine cancer or ovarian cancer, talk with your health care provider about genetic screening.  If you have vaginal bleeding after reaching menopause, tell your health care provider.  There are currently no reliable tests available to screen for ovarian cancer.  Lung Cancer Lung cancer screening is recommended for adults 38-29 years old who are at high risk for  lung cancer because of a history of smoking. A yearly low-dose CT scan of the lungs is recommended if you:  Currently smoke.  Have a history of at least 30 pack-years of smoking and you currently smoke or have quit within the past 15 years. A pack-year is smoking an average of one pack of cigarettes per day for one year.  Yearly screening should:  Continue until it has been 15 years since you quit.  Stop if you develop a health problem that would prevent you from having lung cancer treatment.  Colorectal Cancer  This type of cancer can be detected and can often be prevented.  Routine colorectal cancer screening usually begins at age 68 and continues through age 71.  If you have risk factors for colon cancer, your health care provider may recommend that you be screened at an earlier age.  If you have a family history of colorectal cancer, talk with your health care provider about genetic screening.  Your health care provider may also recommend using home test kits to check for hidden blood in your stool.  A small camera at the end of a tube can be used to examine your colon directly (sigmoidoscopy or colonoscopy). This is done to check for the earliest forms of colorectal cancer.  Direct examination of the colon should be repeated every 5-10 years until age 31. However, if early forms of precancerous polyps or small growths are found or if you have a family history or genetic risk for colorectal cancer, you may need to be screened more often.  Skin Cancer  Check your skin from head to toe regularly.  Monitor any moles. Be sure to tell your health care provider: ? About any new moles or changes in moles, especially if there is a change in a mole's shape or color. ? If you have a mole that is larger than the size of a pencil eraser.  If any of your family members has a history of skin cancer, especially at a young age, talk with your health care provider about genetic  screening.  Always use sunscreen. Apply sunscreen liberally and repeatedly throughout the day.  Whenever you are outside, protect yourself by wearing long sleeves, pants, a wide-brimmed hat, and sunglasses.  What should I know about osteoporosis? Osteoporosis is a condition in which bone destruction happens more quickly than new bone creation. After menopause, you may be at an increased risk for osteoporosis. To help prevent osteoporosis or the bone fractures that can happen because of osteoporosis, the following is recommended:  If you are 4-23 years old, get at least 1,000 mg of calcium and at least 600 mg of vitamin D per day.  If you are older than age 31 but younger than age 37, get at least 1,200 mg of calcium and at least 600 mg of vitamin D per day.  If you are older than age 30, get at least 30,200  mg of calcium and at least 800 mg of vitamin D per day.  Smoking and excessive alcohol intake increase the risk of osteoporosis. Eat foods that are rich in calcium and vitamin D, and do weight-bearing exercises several times each week as directed by your health care provider. What should I know about how menopause affects my mental health? Depression may occur at any age, but it is more common as you become older. Common symptoms of depression include:  Low or sad mood.  Changes in sleep patterns.  Changes in appetite or eating patterns.  Feeling an overall lack of motivation or enjoyment of activities that you previously enjoyed.  Frequent crying spells.  Talk with your health care provider if you think that you are experiencing depression. What should I know about immunizations? It is important that you get and maintain your immunizations. These include:  Tetanus, diphtheria, and pertussis (Tdap) booster vaccine.  Influenza every year before the flu season begins.  Pneumonia vaccine.  Shingles vaccine.  Your health care provider may also recommend other  immunizations. This information is not intended to replace advice given to you by your health care provider. Make sure you discuss any questions you have with your health care provider. Document Released: 05/18/2005 Document Revised: 10/14/2015 Document Reviewed: 12/28/2014 Elsevier Interactive Patient Education  2018 [employer].

## 2017-07-09 ENCOUNTER — Ambulatory Visit (INDEPENDENT_AMBULATORY_CARE_PROVIDER_SITE_OTHER): Payer: BLUE CROSS/BLUE SHIELD | Admitting: Obstetrics & Gynecology

## 2017-07-09 DIAGNOSIS — Z3046 Encounter for surveillance of implantable subdermal contraceptive: Secondary | ICD-10-CM

## 2017-07-09 DIAGNOSIS — Z30017 Encounter for initial prescription of implantable subdermal contraceptive: Secondary | ICD-10-CM

## 2017-07-09 NOTE — Progress Notes (Signed)
Bailey Hooper 1965-08-31 [id number]        52 y.o.  G2P0201   RP: Removal/insertion of Nexplanon  HPI: Due for change in Nexplanon.  Used condoms recently.  Had some breakthrough bleeding in January 2019, which has improved recently.  No pelvic pain.   OB History  Gravida Para Term Preterm AB Living  2 2   2   1   SAB TAB Ectopic Multiple Live Births      0        # Outcome Date GA Lbr Len/2nd Weight Sex Delivery Anes PTL Lv  2 Preterm           1 Preterm             Past medical history,surgical history, problem list, medications, allergies, family history and social history were all reviewed and documented in the EPIC chart.   Directed ROS with pertinent positives and negatives documented in the history of present illness/assessment and plan.  Exam:  There were no vitals filed for this visit. General appearance:  Normal                                              Nexplanon Procedure Note (Removal/ccinsertion)   The patient was laying on her back with her nondominant arm flexed at the elbow and externally rotated.  The Nexplanon was felt very superficially on the inside of the left arm.  A mark was made with a sterile marker at the spot where the Nexplanon implant distal tip was pointing while pressing on the other end of the implant. The area was cleansed with Betadine solution. The area was anesthetized with 1% lidocaine  (1 cc)  at the area the injection site and underneath the skin along the implant.  A small incision was made with a scalpel at the marked skin.  The opposite extremity of the implant was pressed and the distal end of the implant came out at the incision.  The tip of the implant was grasped with a hemostat and pulled out easily.  It was intact and complete and shown to the patient before discarding it. The preloaded disposable Nexplanon was removed from its sterile casing.  The applicator was held above the needle at the textured surface area. The transparent  protector was removed.  With the tip of the needle angled at 30, the needle was inserted at the incision. The Nexplanon applicator was lowered to a horizontal position. While lifting the skin with the tip of the needle the needle was then slid to its full length. The applicator was kept in this position with the needle inserted to its full length. The purple slider was unlocked by pushing it slightly downward. The slider was fully moved back until it stopped. This allowed the implant to be in the final subdermal position and the needle to be locked inside the body of the applicator. The applicator was then removed. 3 Steri-Strips were applied over the incision, a band-aid and a bandage were placed.  The patient will remove the bandage tomorrow. No complications, the patient tolerated procedure well and was released home with instructions.   Assessment/Plan:  52 y.o. G2P0201   1. Nexplanon removal Easy Nexplanon removal.  No complication and well-tolerated.  2. Nexplanon insertion New Nexplanon inserted without difficulty and well-tolerated.  Counseling on Nexplanon done previously.  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  Post insertion instructions added to the summary.  Patient will follow up when due for annual gynecologic exam.  Princess Bruins MD, 12:24 PM 07/09/2017

## 2017-07-10 ENCOUNTER — Encounter: Payer: Self-pay | Admitting: Obstetrics & Gynecology

## 2017-07-10 ENCOUNTER — Encounter: Payer: Self-pay | Admitting: Internal Medicine

## 2017-07-10 NOTE — Patient Instructions (Signed)
1. Nexplanon removal Easy Nexplanon removal.  No complication and well-tolerated.  2. Nexplanon insertion New Nexplanon inserted without difficulty and well-tolerated.  Counseling on Nexplanon done previously.  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  Post insertion instructions added to the summary.  Patient will follow up when due for annual gynecologic exam.  Sherrica, good seeing you today!  Nexplanon Instructions After Insertion   Keep bandage clean and dry for 24 hours   May use ice/Tylenol/Ibuprofen for soreness or pain   If you develop fever, drainage or increased warmth from incision site-contact office immediately

## 2017-07-15 ENCOUNTER — Encounter: Payer: Self-pay | Admitting: Anesthesiology

## 2017-07-19 ENCOUNTER — Encounter: Payer: Self-pay | Admitting: Obstetrics & Gynecology

## 2017-07-29 ENCOUNTER — Ambulatory Visit (INDEPENDENT_AMBULATORY_CARE_PROVIDER_SITE_OTHER): Payer: BLUE CROSS/BLUE SHIELD | Admitting: Internal Medicine

## 2017-07-29 ENCOUNTER — Other Ambulatory Visit: Payer: Self-pay

## 2017-07-29 ENCOUNTER — Encounter: Payer: Self-pay | Admitting: Internal Medicine

## 2017-07-29 ENCOUNTER — Other Ambulatory Visit: Payer: Self-pay | Admitting: Internal Medicine

## 2017-07-29 DIAGNOSIS — F411 Generalized anxiety disorder: Secondary | ICD-10-CM | POA: Diagnosis not present

## 2017-07-29 DIAGNOSIS — Z1239 Encounter for other screening for malignant neoplasm of breast: Secondary | ICD-10-CM

## 2017-07-29 DIAGNOSIS — I10 Essential (primary) hypertension: Secondary | ICD-10-CM

## 2017-07-29 MED ORDER — CLONAZEPAM 0.5 MG PO TABS: 0.5000 mg | ORAL_TABLET | Freq: Three times a day (TID) | ORAL | 0 refills | Status: DC | PRN

## 2017-07-29 MED ORDER — AMLODIPINE BESYLATE 10 MG PO TABS: 10.0000 mg | ORAL_TABLET | Freq: Every day | ORAL | 1 refills | Status: DC

## 2017-07-29 NOTE — Patient Instructions (Signed)
It was so nice to see you today!  Your blood pressure looks great today.  I am glad that you getting in to see a psychiatrist.  We will see you back in 6 months!  -Dr. Brett Albino

## 2017-07-29 NOTE — Assessment & Plan Note (Signed)
Well-controlled. BP 138/80 in clinic today. Goal <140/<90. Stopped taking the Losartan 1 month ago due to the recall.  - Continue Norvasc 10mg  daily - Will not restart an ARB, as patient's BP is controlled without it and she does not have another indication for it (no hx of CHF, DM, or CKD) - Follow-up in 6 months

## 2017-07-29 NOTE — Progress Notes (Signed)
   [facility] Phone: [phone]  Subjective:  Bailey Hooper is a 52 year old female presenting to clinic for follow-up of her anxiety and HTN.  Anxiety: Worsened recently due to job stress. She feels like she has been moved around a lot in her current job and has had to take on a lot of additional responsibility. She is having a hard time sleeping and staying focused at work. She is currently looking for another job. She also has stress at home with her daughter losing her job and relying on her to provide for her. She had a friend pass away recently, which has also affected her. She was seen by a psychiatrist at [facility]. She was prescribed Remeron 15mg  daily for sleep. She states this makes her tired, but she feels very groggy in the morning. She has cut the tablet in half, but she still notes morning grogginess. She was also prescribed Lamictal, which she has not started taking yet. She has scheduled another appointment with a different psychiatrist because she wants to find someone she connects with. That appointment is scheduled for May 6th and she is going to see who she likes better.  HTN: Checking her BP at home a couple times per week. Have been in the 130s/80s. Taking Norvasc 10mg  daily. She is prescribed Losartan, but stopped taking it do to the recall. No chest pain, no shortness of breath, no lower extremity edema. No side effects to the medications.  ROS: See HPI for pertinent positives and negatives  Past Medical History- HTN, migraines, IBS, genital herpes, asthma, GAD, major depression.  Family history reviewed for today's visit. No changes.  Social history- patient is a current smoker  Objective: BP 138/80   Pulse 94   Temp 98.4 F (36.9 C) (Oral)   Wt 175 lb (79.4 kg)   SpO2 98%   BMI 27.61 kg/m  Gen: NAD, alert, cooperative with exam HEENT: NCAT, EOMI, MMM Neck: FROM, supple CV: RRR, no murmur Resp: CTABL, no wheezes, normal work  of breathing Msk: No edema Psych: Mildly increased rate of speech, appropriate affect, normal behavior, normal thought content, normal judgment, no SI/HI  GAD-7: 21 PHQ-9: 22  Assessment/Plan: Depression/Anxiety: Uncontrolled. GAD-7 is a 21 and PHQ-9 is a 22. Has been seen once by psychiatry and was prescribed Remeron and Lamictal. Has not yet taken the Lamictal. Has another appointment scheduled with them 4/30. - Refilled Klonopin #30. Discussed that this prescription will likely need to be taken over by her psychiatrist.  - Follow-up with psychiatry for further management  HTN: Well-controlled. BP 138/80 in clinic today. Goal <140/<90. Stopped taking the Losartan 1 month ago due to the recall.  - Continue Norvasc 10mg  daily - Will not restart an ARB, as patient's BP is controlled without it and she does not have another indication for it (no hx of CHF, DM, or CKD) - Follow-up in 6 months  Hyman Bible, MD PGY-3

## 2017-07-29 NOTE — Assessment & Plan Note (Signed)
Uncontrolled. GAD-7 is a 21 and PHQ-9 is a 22. Has been seen once by psychiatry and was prescribed Remeron and Lamictal. Has not yet taken the Lamictal. Has another appointment scheduled with them 4/30. - Refilled Klonopin #30. Discussed that this prescription will likely need to be taken over by her psychiatrist.  - Follow-up with psychiatry for further management

## 2017-08-01 NOTE — Progress Notes (Deleted)
BH MD/PA/NP OP Progress Note  08/01/2017 12:46 PM Bailey Hooper  MRN:  [id number]  Chief Complaint:  HPI:  Bailey Hooper is a 52 y.o. year old female with a history of depression , who presents for follow up appointment for No diagnosis found. She is a patient of Dr. De Nurse.   Mirtazapine, buspar increased  Visit Diagnosis: No diagnosis found.  Past Psychiatric History:  I have reviewed the patient's psychiatry history in detail and updated the patient record.  Past Medical History:  Past Medical History:  Diagnosis Date  . Anxiety   . Asthma   . Depression   . GERD (gastroesophageal reflux disease)   . High cholesterol   . Hypertension   . IBS (irritable bowel syndrome)   . Migraines   . Sleep apnea     Past Surgical History:  Procedure Laterality Date  . CESAREAN SECTION    . FOOT SURGERY     Left foot    Family Psychiatric History:  I have reviewed the patient's family history in detail and updated the patient record.  Family History:  Family History  Problem Relation Age of Onset  . Colon cancer Unknown        Father  . Cancer Unknown        Oral, uncle  . Alcohol abuse Unknown        Father  . Drug abuse Unknown        Father  . Osteoporosis Unknown   . Depression Unknown   . Hypertension Unknown   . Diabetes Unknown   . Heart disease Unknown        Grandmother  . Colon cancer Father   . Esophageal cancer Neg Hx     Social History:  Social History   Socioeconomic History  . Marital status: Divorced    Spouse name: Not on file  . Number of children: Not on file  . Years of education: Not on file  . Highest education level: Not on file  Occupational History  . Not on file  Social Needs  . Financial resource strain: Not on file  . Food insecurity:    Worry: Not on file    Inability: Not on file  . Transportation needs:    Medical: Not on file    Non-medical: Not on file  Tobacco Use  . Smoking status: Current Every Day Smoker    Packs/day:  0.25    Years: 29.00    Pack years: 7.25    Types: Cigarettes    Start date: 04/09/1986  . Smokeless tobacco: Never Used  . Tobacco comment: Previous 0.5 PPD. trying to quitt. Does not smoke when sick  Substance and Sexual Activity  . Alcohol use: Yes    Alcohol/week: 0.0 oz  . Drug use: No  . Sexual activity: Yes    Birth control/protection: Implant  Lifestyle  . Physical activity:    Days per week: Not on file    Minutes per session: Not on file  . Stress: Not on file  Relationships  . Social connections:    Talks on phone: Not on file    Gets together: Not on file    Attends religious service: Not on file    Active member of club or organization: Not on file    Attends meetings of clubs or organizations: Not on file    Relationship status: Not on file  Other Topics Concern  . 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 Lisinopril Palpitations and Cough    Per pt report  . Penicillins Rash    Vesicles, denies SOB or breathing issues  . Erythromycin Nausea And Vomiting    Metabolic Disorder Labs: Lab Results  Component Value Date   HGBA1C 5.9 06/11/2016   No results found for: PROLACTIN Lab Results  Component Value Date   CHOL 151 06/11/2016   TRIG 76 06/11/2016   HDL 46 (L) 06/11/2016   CHOLHDL 3.3 06/11/2016   VLDL 15 06/11/2016   LDLCALC 90 06/11/2016   LDLCALC 90 03/11/2015   Lab Results  Component Value Date   TSH 0.594 03/13/2017   TSH 0.617 06/16/2010    Therapeutic Level Labs: No results found for: LITHIUM No results found for: VALPROATE No components found for:  CBMZ  Current Medications: Current Outpatient Medications  Medication Sig Dispense Refill  . albuterol (PROVENTIL HFA;VENTOLIN HFA) 108 (90 BASE) MCG/ACT inhaler Inhale 2 puffs into the lungs every 6 (six) hours as  needed for wheezing.     Marland Kitchen amLODipine (NORVASC) 10 MG tablet Take 1 tablet (10 mg total) by mouth daily. 90 tablet 1  . azelastine (OPTIVAR) 0.05 % ophthalmic solution Place 1 drop into both eyes 2 (two) times daily. 6 mL 12  . busPIRone (BUSPAR) 10 MG tablet Take twice a day 60 tablet 0  . chlorhexidine (HIBICLENS) 4 % external liquid Apply topically daily as needed. 120 mL 3  . clonazePAM (KLONOPIN) 0.5 MG tablet Take 1 tablet (0.5 mg total) by mouth 3 (three) times daily as needed. for anxiety 30 tablet 0  . Dentifrices (BIOTENE DRY MOUTH CARE DT) Place onto teeth as needed.    . etonogestrel (IMPLANON) 68 MG IMPL implant 1 each by Subdermal route once.    . fluocinonide cream (LIDEX) 1.61 % Apply 1 application topically 2 (two) times daily.    . hydrocortisone 2.5 % cream Apply topically 2 (two) times daily. 30 g 0  . ipratropium (ATROVENT) 0.03 % nasal spray Place 2 sprays into both nostrils every 12 (twelve) hours. 30 mL 1  . ketoconazole (NIZORAL) 2 % cream Apply 1 application topically daily.    Marland Kitchen lamoTRIgine (LAMICTAL) 25 MG tablet Take 1 tablet (25 mg total) by mouth daily. Take one tablet daily for a week and then start taking 2 tablets. 60 tablet 0  . Melatonin 1 MG CAPS Take by mouth.    . mirtazapine (REMERON) 15 MG tablet Take 1 tablet (15 mg total) by mouth at bedtime. Take half a tablet at night for first 3 nights and then one a night 30 tablet 0  . montelukast (SINGULAIR) 10 MG tablet Take 1 tablet (10 mg total) by mouth at bedtime. 30 tablet 5  . Multiple Vitamin (MULTIVITAMIN) capsule Take 1 capsule by mouth daily.    . mupirocin cream (BACTROBAN) 2 % Apply 1 application topically 2 (two) times daily. 15 g 0  . nystatin (MYCOSTATIN/NYSTOP) 100000 UNIT/GM POWD Apply to irritated vulvar area (not mucous membranes) twice daily for 1 week 30 g 1  . omeprazole (PRILOSEC) 40 MG capsule TAKE 1 CAPSULE BY MOUTH DAILY AT LEAST 30 MINS BEFORE BREAKFAST 30 capsule 0  . ranitidine  (ZANTAC) 150 MG tablet Take 1 tablet (150 mg total) by mouth at bedtime.  30 tablet 2  . tamsulosin (FLOMAX) 0.4 MG CAPS capsule Take 1 capsule (0.4 mg total) by mouth daily. 30 capsule 3  . triamcinolone (KENALOG) 0.025 % ointment Apply 1 application topically 2 (two) times daily.     Current Facility-Administered Medications  Medication Dose Route Frequency Provider Last Rate Last Dose  . 0.9 %  sodium chloride infusion  500 mL Intravenous Continuous Nandigam, Venia Minks, MD         Musculoskeletal: Strength & Muscle Tone: within normal limits Gait & Station: normal Patient leans: N/A  Psychiatric Specialty Exam: ROS  There were no vitals taken for this visit.There is no height or weight on file to calculate BMI.  General Appearance: Fairly Groomed  Eye Contact:  Good  Speech:  Clear and Coherent  Volume:  Normal  Mood:  {BHH MOOD:22306}  Affect:  {Affect (PAA):22687}  Thought Process:  Coherent and Goal Directed  Orientation:  Full (Time, Place, and Person)  Thought Content: Logical   Suicidal Thoughts:  {ST/HT (PAA):22692}  Homicidal Thoughts:  {ST/HT (PAA):22692}  Memory:  Immediate;   Good  Judgement:  {Judgement (PAA):22694}  Insight:  {Insight (PAA):22695}  Psychomotor Activity:  Normal  Concentration:  Concentration: Good and Attention Span: Good  Recall:  Good  Fund of Knowledge: Good  Language: Good  Akathisia:  No  Handed:  Right  AIMS (if indicated): not done  Assets:  Communication Skills Desire for Improvement  ADL's:  Intact  Cognition: WNL  Sleep:  {BHH GOOD/FAIR/POOR:22877}   Screenings: PHQ2-9     Office Visit from 02/27/2017 in [place] from 12/07/2016 in [place] Office Visit from 06/11/2016 in [place] Office Visit from 04/27/2016 in [place] Office Visit from 09/20/2015 in [place]  PHQ-2 Total Score  0  0  0  0   0       Assessment and Plan:  Sidda Humm is a 52 y.o. year old female with a history of , who presents for follow up appointment for No diagnosis found.    Norman Clay, MD 08/01/2017, 12:46 PM

## 2017-08-03 ENCOUNTER — Ambulatory Visit (HOSPITAL_COMMUNITY): Payer: BLUE CROSS/BLUE SHIELD | Admitting: Psychiatry

## 2017-08-15 ENCOUNTER — Encounter: Payer: Self-pay | Admitting: Internal Medicine

## 2017-08-16 ENCOUNTER — Other Ambulatory Visit: Payer: Self-pay | Admitting: Internal Medicine

## 2017-08-16 MED ORDER — TRAMADOL HCL 50 MG PO TABS: 50.0000 mg | ORAL_TABLET | Freq: Two times a day (BID) | ORAL | 0 refills | Status: DC | PRN

## 2017-08-17 ENCOUNTER — Encounter: Payer: Self-pay | Admitting: Obstetrics & Gynecology

## 2017-08-19 ENCOUNTER — Encounter: Payer: Self-pay | Admitting: Internal Medicine

## 2017-08-20 ENCOUNTER — Other Ambulatory Visit: Payer: Self-pay | Admitting: Internal Medicine

## 2017-08-20 MED ORDER — IPRATROPIUM BROMIDE 0.03 % NA SOLN: 2.0000 | Freq: Two times a day (BID) | NASAL | 1 refills | Status: DC

## 2017-09-07 ENCOUNTER — Encounter: Payer: Self-pay | Admitting: Internal Medicine

## 2017-10-04 ENCOUNTER — Encounter: Payer: Self-pay | Admitting: Internal Medicine

## 2017-10-08 ENCOUNTER — Telehealth: Payer: Self-pay

## 2017-10-08 ENCOUNTER — Other Ambulatory Visit: Payer: Self-pay

## 2017-10-08 MED ORDER — CLONAZEPAM 0.5 MG PO TABS: 0.5000 mg | ORAL_TABLET | Freq: Three times a day (TID) | ORAL | 0 refills | Status: DC | PRN

## 2017-10-08 NOTE — Telephone Encounter (Signed)
Pt called stating she wanted a refill on her Klonopin. I informed she may need an apt for this, as her pcp has changed. Pt declined an apt, "its hard for me to get off of work." Please advise.

## 2017-10-08 NOTE — Telephone Encounter (Signed)
Please let her know that per clinic protocol, we don't switch patient from a resident to a faculty and vice versa. Thanks.

## 2017-10-08 NOTE — Addendum Note (Signed)
Addended by: Grant Ruts on: 10/08/2017 06:07 PM   Modules accepted: Orders

## 2017-10-08 NOTE — Telephone Encounter (Signed)
I called pt to inform her of below, pt stated, "this isn't fair to me, the residents change all the time and I want a full time MD." I explained to pt I can not go any further with this and suggested she speak with the office manager. Pt was appreciative of my call and the refill her new pcp, Pilar Plate, sent in on her behalf.

## 2017-10-08 NOTE — Telephone Encounter (Signed)
Pt was previously a patient of Dr. Velia Meyer. Pt stated she discussed having her new pcp be a faculty provider. Pt states she would like Dr. Gwendlyn Deutscher to be her new pcp. I informed pt I would have to get permission from [facility] before the pcp change is made. Please advise.  Pt would like the pcp form faxed to her. Fax number [phone].

## 2017-10-11 ENCOUNTER — Ambulatory Visit
Admission: RE | Admit: 2017-10-11 | Discharge: 2017-10-11 | Disposition: A | Discharge status: Home / Self Care | Payer: BLUE CROSS/BLUE SHIELD | Source: Ambulatory Visit | Attending: Family Medicine | Admitting: Family Medicine

## 2017-10-11 DIAGNOSIS — Z1239 Encounter for other screening for malignant neoplasm of breast: Secondary | ICD-10-CM

## 2017-10-16 ENCOUNTER — Encounter: Payer: Self-pay | Admitting: Family Medicine

## 2017-10-16 NOTE — Telephone Encounter (Signed)
Pharmacy never received refill. Called in verbally. Bailey Hooper, [name], CMA

## 2017-11-12 ENCOUNTER — Encounter: Payer: Self-pay | Admitting: Family Medicine

## 2017-11-14 ENCOUNTER — Encounter: Payer: Self-pay | Admitting: Family Medicine

## 2017-11-18 ENCOUNTER — Telehealth: Payer: Self-pay | Admitting: *Deleted

## 2017-11-18 NOTE — Telephone Encounter (Signed)
-----   Message from Martyn Malay, MD sent at 11/15/2017  8:49 AM EDT ----- Regarding: Appointment Please schedule patient for a follow up visit to discuss vertigo.    Thanks!

## 2017-11-18 NOTE — Telephone Encounter (Signed)
Pt scheduled for an appt. Deseree Blount, CMA  

## 2017-11-29 ENCOUNTER — Other Ambulatory Visit: Payer: Self-pay

## 2017-11-29 ENCOUNTER — Ambulatory Visit: Payer: BLUE CROSS/BLUE SHIELD | Admitting: Family Medicine

## 2017-11-29 ENCOUNTER — Encounter: Payer: Self-pay | Admitting: Family Medicine

## 2017-11-29 VITALS — BP 130/92 | HR 77 | Temp 98.2°F | Ht 66.0 in | Wt 176.4 lb

## 2017-11-29 DIAGNOSIS — L0293 Carbuncle, unspecified: Secondary | ICD-10-CM

## 2017-11-29 DIAGNOSIS — H8141 Vertigo of central origin, right ear: Secondary | ICD-10-CM

## 2017-11-29 DIAGNOSIS — Z720 Tobacco use: Secondary | ICD-10-CM | POA: Diagnosis not present

## 2017-11-29 DIAGNOSIS — H8111 Benign paroxysmal vertigo, right ear: Secondary | ICD-10-CM

## 2017-11-29 DIAGNOSIS — M545 Low back pain, unspecified: Secondary | ICD-10-CM

## 2017-11-29 DIAGNOSIS — R4789 Other speech disturbances: Secondary | ICD-10-CM

## 2017-11-29 DIAGNOSIS — G8929 Other chronic pain: Secondary | ICD-10-CM

## 2017-11-29 DIAGNOSIS — R51 Headache: Secondary | ICD-10-CM | POA: Diagnosis not present

## 2017-11-29 DIAGNOSIS — IMO0001 Reserved for inherently not codable concepts without codable children: Secondary | ICD-10-CM

## 2017-11-29 DIAGNOSIS — H8110 Benign paroxysmal vertigo, unspecified ear: Secondary | ICD-10-CM | POA: Insufficient documentation

## 2017-11-29 DIAGNOSIS — L0292 Furuncle, unspecified: Secondary | ICD-10-CM

## 2017-11-29 DIAGNOSIS — G43909 Migraine, unspecified, not intractable, without status migrainosus: Secondary | ICD-10-CM

## 2017-11-29 DIAGNOSIS — J301 Allergic rhinitis due to pollen: Secondary | ICD-10-CM

## 2017-11-29 DIAGNOSIS — I10 Essential (primary) hypertension: Secondary | ICD-10-CM | POA: Diagnosis not present

## 2017-11-29 DIAGNOSIS — Z0000 Encounter for general adult medical examination without abnormal findings: Secondary | ICD-10-CM

## 2017-11-29 DIAGNOSIS — R519 Headache, unspecified: Secondary | ICD-10-CM

## 2017-11-29 DIAGNOSIS — K2950 Unspecified chronic gastritis without bleeding: Secondary | ICD-10-CM

## 2017-11-29 DIAGNOSIS — G4733 Obstructive sleep apnea (adult) (pediatric): Secondary | ICD-10-CM

## 2017-11-29 MED ORDER — AMLODIPINE BESYLATE 10 MG PO TABS: 10.0000 mg | ORAL_TABLET | Freq: Every day | ORAL | 1 refills | Status: DC

## 2017-11-29 MED ORDER — CHLORHEXIDINE GLUCONATE 4 % EX LIQD: Freq: Every day | CUTANEOUS | 3 refills | Status: DC | PRN

## 2017-11-29 MED ORDER — ALBUTEROL SULFATE HFA 108 (90 BASE) MCG/ACT IN AERS: 2.0000 | INHALATION_SPRAY | Freq: Four times a day (QID) | RESPIRATORY_TRACT | 11 refills | Status: DC | PRN

## 2017-11-29 MED ORDER — SUMATRIPTAN SUCCINATE 25 MG PO TABS: 25.0000 mg | ORAL_TABLET | ORAL | 3 refills | Status: DC | PRN

## 2017-11-29 MED ORDER — TAMSULOSIN HCL 0.4 MG PO CAPS: 0.4000 mg | ORAL_CAPSULE | Freq: Every day | ORAL | 3 refills | Status: DC

## 2017-11-29 MED ORDER — OMEPRAZOLE 40 MG PO CPDR: 40.0000 mg | DELAYED_RELEASE_CAPSULE | Freq: Every day | ORAL | 0 refills | Status: DC

## 2017-11-29 MED ORDER — NAPROXEN 500 MG PO TABS: 500.0000 mg | ORAL_TABLET | Freq: Two times a day (BID) | ORAL | 0 refills | Status: DC

## 2017-11-29 MED ORDER — AZELASTINE HCL 0.05 % OP SOLN: 1.0000 [drp] | Freq: Two times a day (BID) | OPHTHALMIC | 12 refills | Status: DC

## 2017-11-29 NOTE — Progress Notes (Signed)
Patient Name: Bailey Hooper Date of Birth: 12-Sep-1965 Date of Visit: 11/29/17 PCP: Martyn Malay, MD  Chief Complaint: anxiety, vertigo   Subjective: Bailey Hooper is a pleasant 52 year old female with medical history significant for generalized anxiety, persistent vertiginous symptoms, HTN, hyperlipidemia, tobacco abuse, and OSA presenting today for check in.  Vertigo Ms. Calame was recently seen for vertiginous symptoms for nearly a decade. This began around 2013. The vertigo is precipitated by turning her head to the right when she is laying down.  She reports a sensation that her head is spinning when this occurs.  It at times occurs when she is walking or turns her head sharply to the right while at work.  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  Denies falls changes in vision numbness or weakness.  She does endorse episodes of speech change with these episodes previously.  Which she is very worried about.  She also reports that the beginning of these episodes she noticed "her face looked different".  He has previously seen ENT for recurrent sinusitis.  She is previously seen neurology for numbness and tingling.  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  She reports she has done multiple sessions of physical therapy and tried Epley maneuvers in the past which have not helped. She reports meclizine causes sleepiness at work and she cannot take this. She is very worried about these symptoms.   OSA She has a diagnosis of sleep apnea but has not been using her machine due claustrophobia.  She is interested in trying the nasal pillows instead.  HTN She reports compliance to her medications. Denies headaches, chest pain, shortness of breath.   Chronic Back Pain Reports a longstanding history of chronic back pain. She takes Tramadol 1-2 times per month. Request refill. She reports Naproxen also helps with symptoms.   Refills: Reports she needs refills on Sumatriptan and a  few other medications.  Depression/Anxiety: Psychiatry (Dr. Robina Ade) prescribing Clonazepam and other medication sat this time.   ROS:  ROS As above.   I have reviewed the patient's medical, surgical, family, and social history as appropriate.   Vitals:   11/29/17 0842  BP: (!) 130/92  Pulse: 77  Temp: 98.2 F (36.8 C)  SpO2: 99%   Filed Weights   11/29/17 0842  Weight: 176 lb 6.4 oz (80 kg)   HEENT: Sclera anicteric. Dentition is moderate. Appears well hydrated. Neck: Supple Cardiac: Regular rate and rhythm. Normal S1/S2. No murmurs, rubs, or gallops appreciated. Lungs: Clear bilaterally to ascultation.  Abdomen: Normoactive bowel sounds. No tenderness to deep or light palpation. No rebound or guarding.  Extremities: Warm, well perfused without edema.  Skin: Warm, dry Psych: Pleasant and appropriate  Neuro: CN II: PERRL CN III, IV,VI: EOMI CV V: Normal sensation in V1, V2, V3 CVII: Symmetric brow raise, facial droop on right  CN VIII: Normal hearing CN IX,X: Symmetric palate raise  CN XI: 5/5 shoulder shrug CN XII: Symmetric tongue protrusion  Negative Rhomberg    Bailey Hooper was seen today for dizziness.  Diagnoses and all orders for this visit:  Tobacco abuse, discussed quitting.   Essential hypertension, controlled. [place] as below.  -     Basic Metabolic Panel - Refilled amlodipine   Overweight, lipids and A1C today   Vertigo, most symptoms appear to be peripheral and characteristic of BPPV, however, she has a right facial droop on exam, which she has had for four years per  patient. She also endorses change in hearing at times and one episode of change in speech. She was seen by Neurology at [facility] for symptoms in the past, thought to be TIA.  -     MR Brain W Wo Contrast; Future Pending results, will refer to PT for therapy.   OSA (obstructive sleep apnea) -     DME Other see comment  Carbuncle and furuncle refill only  -     chlorhexidine  (HIBICLENS) 4 % external liquid; Apply topically daily as needed.  GERD -     omeprazole (PRILOSEC) 40 MG capsule; Take 1 capsule (40 mg total) by mouth daily.  Allergic Rhinitis  -     azelastine (OPTIVAR) 0.05 % ophthalmic solution; Place 1 drop into both eyes 2 (two) times daily.  Chronic back pain previously on tramadol, discussed risks. Sh does have a history of GERD. Will prescribe small amount of Naproxen.   Chronic Bronchitis  -     albuterol (PROVENTIL HFA;VENTOLIN HFA) 108 (90 Base) MCG/ACT inhaler; Inhale 2 puffs into the lungs every 6 (six) hours as needed for wheezing. - Needs PFTs   Migraines, history of migraines, right sided, fewer than 3 per month, refill Sumatriptan.   At follow up: CSY in 2024 History of chronic gastritis Discuss tobacco cessation PFTs   Dorris Singh, MD  [facility] Medicine Teaching Service

## 2017-11-29 NOTE — Patient Instructions (Signed)
I will call you about your nasal device for your CPAP  Go to the lab today  Schedule follow up in 1 month at the front dest  I will call you with lab results   You will be called about your MRI---this has been ordered    I refilled your medications   It was wonderful to meet you today!  Dorris Singh, MD  Family Medicine

## 2017-11-30 LAB — BASIC METABOLIC PANEL
BUN/Creatinine Ratio: 11 (ref 9–23)
BUN: 9 mg/dL (ref 6–24)
CO2: 25 mmol/L (ref 20–29)
Calcium: 9.5 mg/dL (ref 8.7–10.2)
Chloride: 101 mmol/L (ref 96–106)
Creatinine, Ser: 0.85 mg/dL (ref 0.57–1.00)
GFR calc Af Amer: 92 mL/min/{1.73_m2} (ref >59)
GFR calc non Af Amer: 80 mL/min/{1.73_m2} (ref >59)
Glucose: 99 mg/dL (ref 65–99)
Potassium: 4.1 mmol/L (ref 3.5–5.2)
Sodium: 140 mmol/L (ref 134–144)

## 2017-11-30 LAB — LIPID PANEL
Chol/HDL Ratio: 3.7 ratio (ref 0.0–4.4)
Cholesterol, Total: 145 mg/dL (ref 100–199)
HDL: 39 mg/dL — ABNORMAL LOW (ref >39)
LDL Calculated: 91 mg/dL (ref 0–99)
Triglycerides: 74 mg/dL (ref 0–149)
VLDL Cholesterol Cal: 15 mg/dL (ref 5–40)

## 2017-11-30 LAB — HEMOGLOBIN A1C
ESTIMATED AVERAGE GLUCOSE: 128 mg/dL
Hgb A1c MFr Bld: 6.1 % — ABNORMAL HIGH (ref 4.8–5.6)

## 2017-12-02 ENCOUNTER — Telehealth: Payer: Self-pay | Admitting: *Deleted

## 2017-12-02 NOTE — Telephone Encounter (Signed)
Forward message to [place]. Deseree Kennon Holter, CMA

## 2017-12-02 NOTE — Telephone Encounter (Signed)
-----   Message from Martyn Malay, MD sent at 11/29/2017  7:42 PM EDT ----- Regarding: CPAP Supplies Hi Nursing Team,  I placed a DME order for nasal pillows (new CPAP gear). Can you please fax order to [facility]?  Thanks, Dorris Singh, MD

## 2017-12-03 ENCOUNTER — Telehealth: Payer: Self-pay | Admitting: Family Medicine

## 2017-12-03 NOTE — Telephone Encounter (Signed)
Called with results. Awaiting MRI.

## 2017-12-05 ENCOUNTER — Encounter: Payer: Self-pay | Admitting: Family Medicine

## 2017-12-06 ENCOUNTER — Ambulatory Visit (HOSPITAL_COMMUNITY): Payer: BLUE CROSS/BLUE SHIELD

## 2017-12-06 ENCOUNTER — Encounter: Payer: Self-pay | Admitting: Family Medicine

## 2017-12-11 ENCOUNTER — Telehealth: Payer: Self-pay | Admitting: Family Medicine

## 2017-12-11 DIAGNOSIS — G4733 Obstructive sleep apnea (adult) (pediatric): Secondary | ICD-10-CM

## 2017-12-11 NOTE — Telephone Encounter (Signed)
Corene Cornea informed of new orders. Deseree Kennon Holter, CMA

## 2017-12-11 NOTE — Telephone Encounter (Signed)
Called patient. Reports she would like a strapless nasal CPAP mask.   Called [facility]. They report they have Bella Loops---this can be placed with a nasal mask. Insurers typically do not pay for this. Ordered Bella Loops. Nursing- please forward/send DME order for the bella loops (special headgear for mask) to [facility].

## 2017-12-18 ENCOUNTER — Other Ambulatory Visit: Payer: Self-pay | Admitting: Obstetrics & Gynecology

## 2017-12-18 ENCOUNTER — Telehealth: Payer: Self-pay | Admitting: Family Medicine

## 2017-12-18 DIAGNOSIS — G4733 Obstructive sleep apnea (adult) (pediatric): Secondary | ICD-10-CM

## 2017-12-18 NOTE — Telephone Encounter (Signed)
Please call patient and identify current CPAP settings. Please also let her know that she should call her insurance to see if new machine is covered.

## 2017-12-18 NOTE — Telephone Encounter (Signed)
Pt needs to have a new CPAP machine ordered through [facility] on [address]. She has had the same machine since 2010 and was told she was suppose to get a new one every 5 years. Pt would also like to make sure this is covered by her insurance.

## 2017-12-18 NOTE — Telephone Encounter (Signed)
Dr. Mariah Milling patient. Current on CE.

## 2017-12-18 NOTE — Telephone Encounter (Signed)
Okay for Diflucan 150 mg 1 tablet if no relief office visit

## 2017-12-19 NOTE — Telephone Encounter (Signed)
New Rx in chart.   Dorris Singh, MD  Family Medicine Teaching Service

## 2017-12-19 NOTE — Telephone Encounter (Signed)
Found settings in previous note.  Contacted pt, she states that she never had the CPAP machine change or serviced so she feels the setting would be the same.    She will call her insurance to see if a new machine is covered and at what cost.     She would still like for Bailey Hooper to send the order to [facility]. Tocarra Gassen, [name], CMA

## 2018-01-08 ENCOUNTER — Encounter: Payer: Self-pay | Admitting: Family Medicine

## 2018-01-09 ENCOUNTER — Telehealth: Payer: Self-pay

## 2018-01-09 NOTE — Telephone Encounter (Signed)
Patient sent my chart message regarding difficulties at work and questions about medications.  These medications have not been prescribed by me.  These have been prescribed by her psychiatrist in the past.  They have not been prescribed in several months.  I would not recommend intermittently taking these medications together.  Nursing- Please call patient and recommend she either schedule an appointment to discuss this or call her prescribing physician.

## 2018-01-09 NOTE — Telephone Encounter (Signed)
Called patient per Dr. Owens Shark and advised her to call her prescriber of her ADHD medicine and clonazepam as far as taking the two together. Informed patient that it is Dr. Saul Fordyce advice not to take the two intermittently.   Informed patient that she could also make an appointment here to discuss with her PCP.  Patient responded that she would call prescriber of the two drugs and appreciated the call.  Bailey Hooper, [place]

## 2018-01-14 ENCOUNTER — Other Ambulatory Visit: Payer: Self-pay

## 2018-01-14 ENCOUNTER — Encounter: Payer: Self-pay | Admitting: Family Medicine

## 2018-01-14 ENCOUNTER — Ambulatory Visit: Payer: BLUE CROSS/BLUE SHIELD | Admitting: Family Medicine

## 2018-01-14 DIAGNOSIS — J019 Acute sinusitis, unspecified: Secondary | ICD-10-CM

## 2018-01-14 DIAGNOSIS — B9689 Other specified bacterial agents as the cause of diseases classified elsewhere: Secondary | ICD-10-CM | POA: Diagnosis not present

## 2018-01-14 MED ORDER — AMOXICILLIN-POT CLAVULANATE 875-125 MG PO TABS: 1.0000 | ORAL_TABLET | Freq: Two times a day (BID) | ORAL | 0 refills | Status: AC

## 2018-01-14 NOTE — Patient Instructions (Signed)
Upper Respiratory Infection, Adult Most upper respiratory infections (URIs) are caused by a virus. A URI affects the nose, throat, and upper air passages. The most common type of URI is often called "the common cold." Follow these instructions at home:  Take medicines only as told by your doctor.  Gargle warm saltwater or take cough drops to comfort your throat as told by your doctor.  Use a warm mist humidifier or inhale steam from a shower to increase air moisture. This may make it easier to breathe.  Drink enough fluid to keep your pee (urine) clear or pale yellow.  Eat soups and other clear broths.  Have a healthy diet.  Rest as needed.  Go back to work when your fever is gone or your doctor says it is okay. ? You may need to stay home longer to avoid giving your URI to others. ? You can also wear a face mask and wash your hands often to prevent spread of the virus.  Use your inhaler more if you have asthma.  Do not use any tobacco products, including cigarettes, chewing tobacco, or electronic cigarettes. If you need help quitting, ask your doctor. Contact a doctor if:  You are getting worse, not better.  Your symptoms are not helped by medicine.  You have chills.  You are getting more short of breath.  You have brown or red mucus.  You have yellow or brown discharge from your nose.  You have pain in your face, especially when you bend forward.  You have a fever.  You have puffy (swollen) neck glands.  You have pain while swallowing.  You have white areas in the back of your throat. Get help right away if:  You have very bad or constant: ? Headache. ? Ear pain. ? Pain in your forehead, behind your eyes, and over your cheekbones (sinus pain). ? Chest pain.  You have long-lasting (chronic) lung disease and any of the following: ? Wheezing. ? Long-lasting cough. ? Coughing up blood. ? A change in your usual mucus.  You have a stiff neck.  You have  changes in your: ? Vision. ? Hearing. ? Thinking. ? Mood. This information is not intended to replace advice given to you by your health care provider. Make sure you discuss any questions you have with your health care provider. Document Released: 09/12/2007 Document Revised: 11/27/2015 Document Reviewed: 07/01/2013 Elsevier Interactive Patient Education  2018 Elsevier Inc.  

## 2018-01-14 NOTE — Assessment & Plan Note (Signed)
Patient with persistent symptoms of facial pain, fevers, yellow nasal drainage, cough, and sore throat for 1 week.  Patient has tried conservative measures such as honey, warm liquids, Mucinex, and Chloraseptic spray out any relief.  Patient has had many sinus infections in the past which per chart review has been alleviated with Augmentin.  Patient has listed amoxicillin allergy however there have been multiple prescriptions for Augmentin in her chart.  We will plan to treat patient with Augmentin x7 days.  Strict return precautions given.  Encourage increased fluid hydration.  Follow-up in 1 to 2 weeks if no improvement.

## 2018-01-14 NOTE — Progress Notes (Signed)
   Subjective:    Patient ID: Bailey Hooper, female    DOB: 1965/11/10, 52 y.o.   MRN: [id number]   CC: HA, stuffy nose, sore throat, ear ache, fever  HPI: Cold like symptoms Patient today presenting with multiple cold-like symptoms including headache, dizziness, nausea, sore throat, earache, and chest pain with cough.  Patient also with fevers and chills.  Symptoms began 1 week ago and have persistently worsened especially on Saturday.  Patient also with reported body aches as well.  Patient states symptoms are very similar to when she has had bacterial sinusitis.  Patient does report yellow sputum production as well.  Patient has been unable to go to work today as she is not felt well.  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  Has no contact with actual patients.  Does report sick contacts including her grandchildren and her daughter however there symptoms have not been this severe.  Patient does report decreased appetite.  Patient stated that last time she was sick last January she received antibiotics and improved significantly.   Objective:  BP 134/80   Pulse 70   Temp 98.6 F (37 C) (Oral)   Ht 5\' 6"  (1.676 m)   Wt 176 lb (79.8 kg)   SpO2 98%   BMI 28.41 kg/m  Vitals and nursing note reviewed  General: well nourished, in no acute distress HEENT: normocephalic, PERRL, watery eyes, TM's visualized bilaterally, no scleral icterus or conjunctival pallor, no nasal discharge, moist mucous membranes, good dentition without erythema or discharge noted in posterior oropharynx, pain with palpation of maxillary sinuses  Neck: supple, tender anterior cervical lymphadenopathy Cardiac: RRR, clear S1 and S2, no murmurs, rubs, or gallops Respiratory: clear to auscultation bilaterally, no increased work of breathing Abdomen: soft, nontender, nondistended, no masses or organomegaly. Bowel sounds present Extremities: no edema or cyanosis. Warm, well  perfused. 2+ radial and PT pulses bilaterally Skin: warm and dry, no rashes noted Neuro: alert and oriented, no focal deficits   Assessment & Plan:    Acute bacterial sinusitis Patient with persistent symptoms of facial pain, fevers, yellow nasal drainage, cough, and sore throat for 1 week.  Patient has tried conservative measures such as honey, warm liquids, Mucinex, and Chloraseptic spray out any relief.  Patient has had many sinus infections in the past which per chart review has been alleviated with Augmentin.  Patient has listed amoxicillin allergy however there have been multiple prescriptions for Augmentin in her chart.  We will plan to treat patient with Augmentin x7 days.  Strict return precautions given.  Encourage increased fluid hydration.  Follow-up in 1 to 2 weeks if no improvement.    Return if symptoms worsen or fail to improve.   Caroline More, DO, PGY-2

## 2018-01-21 ENCOUNTER — Telehealth: Payer: Self-pay | Admitting: Family Medicine

## 2018-01-21 NOTE — Telephone Encounter (Signed)
Corene Cornea is calling from [facility] and would like to know if the patient had a recent visit and during that visit was her CPAP discussed? He would like to have those notes faxed to [facility] for documation. jw

## 2018-01-21 NOTE — Telephone Encounter (Signed)
Print visit dated 11/29/2017.  This was discussed at this visit.  Thanks, Dorris Singh, MD  [facility] Medicine Teaching Service

## 2018-01-22 NOTE — Telephone Encounter (Signed)
Faxed as requested to [facility] @ [phone]. Nesreen Albano, [name], CMA

## 2018-01-23 ENCOUNTER — Other Ambulatory Visit: Payer: Self-pay | Admitting: Family Medicine

## 2018-01-23 DIAGNOSIS — G4733 Obstructive sleep apnea (adult) (pediatric): Secondary | ICD-10-CM

## 2018-01-23 NOTE — Progress Notes (Signed)
Pt informed. Deseree Blount, CMA  

## 2018-01-23 NOTE — Progress Notes (Unsigned)
Please call patient and let her know she will require a new sleep study before she can get a new machine etc. I have placed a new referral.   Let me know if she has questions.  Dorris Singh, MD  Family Medicine Teaching Service

## 2018-02-12 ENCOUNTER — Encounter: Payer: Self-pay | Admitting: Family Medicine

## 2018-02-24 ENCOUNTER — Encounter: Payer: Self-pay | Admitting: Family Medicine

## 2018-03-21 ENCOUNTER — Encounter: Payer: Self-pay | Admitting: Family Medicine

## 2018-03-21 DIAGNOSIS — J301 Allergic rhinitis due to pollen: Secondary | ICD-10-CM

## 2018-03-21 DIAGNOSIS — I10 Essential (primary) hypertension: Secondary | ICD-10-CM

## 2018-03-21 DIAGNOSIS — K2950 Unspecified chronic gastritis without bleeding: Secondary | ICD-10-CM

## 2018-03-26 MED ORDER — AZELASTINE HCL 0.05 % OP SOLN: 1.0000 [drp] | Freq: Two times a day (BID) | OPHTHALMIC | 12 refills | Status: DC

## 2018-03-26 MED ORDER — AMLODIPINE BESYLATE 10 MG PO TABS: 10.0000 mg | ORAL_TABLET | Freq: Every day | ORAL | 1 refills | Status: DC

## 2018-03-26 MED ORDER — MONTELUKAST SODIUM 10 MG PO TABS: 10.0000 mg | ORAL_TABLET | Freq: Every day | ORAL | 5 refills | Status: DC

## 2018-03-26 MED ORDER — ALBUTEROL SULFATE HFA 108 (90 BASE) MCG/ACT IN AERS: 2.0000 | INHALATION_SPRAY | Freq: Four times a day (QID) | RESPIRATORY_TRACT | 11 refills | Status: DC | PRN

## 2018-03-27 ENCOUNTER — Ambulatory Visit: Payer: BLUE CROSS/BLUE SHIELD | Admitting: Neurology

## 2018-03-27 ENCOUNTER — Encounter: Payer: Self-pay | Admitting: Neurology

## 2018-03-27 VITALS — BP 138/98 | HR 74 | Ht 66.0 in | Wt 178.0 lb

## 2018-03-27 DIAGNOSIS — G4733 Obstructive sleep apnea (adult) (pediatric): Secondary | ICD-10-CM

## 2018-03-27 DIAGNOSIS — E663 Overweight: Secondary | ICD-10-CM

## 2018-03-27 DIAGNOSIS — R51 Headache: Secondary | ICD-10-CM | POA: Diagnosis not present

## 2018-03-27 DIAGNOSIS — R519 Headache, unspecified: Secondary | ICD-10-CM

## 2018-03-27 DIAGNOSIS — G4719 Other hypersomnia: Secondary | ICD-10-CM

## 2018-03-27 NOTE — Patient Instructions (Addendum)

## 2018-03-27 NOTE — Progress Notes (Signed)
Subjective:    Patient ID: Bailey Hooper is a 52 y.o. female.  HPI     Star Age, MD, PhD [facility] [address], [address] [address] [place], [place] [address]  Dear Dr. Owens Shark,   I saw your patient, Jeannene Tschetter, upon your kind request, in my sleep clinic today for initial consultation of her sleep disorder, in particular, reevaluation of her prior diagnosis of obstructive sleep apnea. The patient is unaccompanied today. As you know, Ms. Alviar is a 52 year old right-handed woman with an underlying medical history of hypertension, hyperlipidemia, reflux disease, anxiety, depression, asthma, irritable bowel syndrome, migraine headaches, prior smoking, vertigo and overweight state, who was previously diagnosed with obstructive sleep apnea and placed on CPAP therapy. Prior sleep study results were reviewed today from 06/03/2011. This was a CPAP titration study, sleep efficiency was 77.1%, sleep latency 71 minutes, REM latency 64 minutes. She had absence of slow-wave sleep and REM sleep was reduced at 15.4%. CPAP was titrated to 10 cm of water pressure at which point her AHI was 1.9 per hour. She is not been using her CPAP regularly. A CPAP download was not available for my review today. I reviewed your office note from 11/29/2017.  Her Epworth sleepiness score is 11 out of 24 today, fatigue score is 59 out of 63. She lives with her daughter and 2 grandchildren. She is divorced, she quit smoking in 2010, drinks alcohol occasionally, caffeine in the form of soda, one per day on average and tea, 1-2 per day on average. She has lost weight with time, she has lost approximately 20 pounds since her sleep studies in 2013. Her machine is about 52 years old, she has not used it in the past few months, she estimates about 3 months. When she used it in the past consistently she felt better rested. Her DME company is [employer]. Her bedtime is around 10, rise time is around 6. She has had  some morning headaches, she does not have night to night nocturia and is not aware of any family history of OSA. She reports a long-standing history of vertigo. She has seen ENT for this as I understand. She has done exercises for this and has been trying to do them at home but does not find them effective. She has tried meclizine which is not effective and makes her sleepy as I understand. You ordered a brain MRI, she has not scheduled it yet. She works second shift for about 8 years and last year in December she switched jobs and is now working first shift. She has to be at work from 8-5. She reports being a light sleeper and has multiple nighttime awakenings, she falls asleep fairly well but does not stay asleep. Her 2 young grandchildren typically sleep in the bed with her, ages 66 and 37. She has a TV in the bedroom that sometimes is on at night. She would prefer a home sleep test.   Her Past Medical History Is Significant For: Past Medical History:  Diagnosis Date  . Anxiety   . Asthma   . Depression   . GERD (gastroesophageal reflux disease)   . High cholesterol   . Hypertension   . IBS (irritable bowel syndrome)   . Migraines   . Sleep apnea   . Tobacco use   . Vertigo     Her Past Surgical History Is Significant For: Past Surgical History:  Procedure Laterality Date  . CESAREAN SECTION    .  FOOT SURGERY     Left foot    Her Family History Is Significant For: Family History  Problem Relation Age of Onset  . Colon cancer Unknown        Father  . Cancer Unknown        Oral, uncle  . Alcohol abuse Unknown        Father  . Drug abuse Unknown        Father  . Osteoporosis Unknown   . Depression Unknown   . Hypertension Unknown   . Diabetes Unknown   . Heart disease Unknown        Grandmother  . Colon cancer Father   . Esophageal cancer Neg Hx     Her Social History Is Significant For: Social History   Socioeconomic History  . Marital status: Divorced    Spouse  name: Not on file  . Number of children: Not on file  . Years of education: Not on file  . Highest education level: Not on file  Occupational History  . Not on file  Social Needs  . Financial resource strain: Not on file  . Food insecurity:    Worry: Not on file    Inability: Not on file  . Transportation needs:    Medical: Not on file    Non-medical: Not on file  Tobacco Use  . Smoking status: Current Every Day Smoker    Packs/day: 0.25    Years: 29.00    Pack years: 7.25    Types: Cigarettes    Start date: 04/09/1986  . Smokeless tobacco: Never Used  . Tobacco comment: Previous 0.5 PPD. trying to quitt. Does not smoke when sick  Substance and Sexual Activity  . Alcohol use: Yes    Alcohol/week: 0.0 standard drinks  . Drug use: No  . Sexual activity: Yes    Birth control/protection: Implant  Lifestyle  . Physical activity:    Days per week: Not on file    Minutes per session: Not on file  . Stress: Not on file  Relationships  . Social connections:    Talks on phone: Not on file    Gets together: Not on file    Attends religious service: Not on file    Active member of club or organization: Not on file    Attends meetings of clubs or organizations: Not on file    Relationship status: Not on file  Other Topics Concern  . 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 Lisinopril Palpitations and Cough    Per pt report  . Penicillins Rash    Vesicles, denies SOB or breathing issues  . Erythromycin Nausea And Vomiting  :   Her Current Medications Are:  Outpatient Encounter Medications as of 03/27/2018  Medication Sig  . albuterol (PROVENTIL HFA;VENTOLIN HFA) 108 (90 Base) MCG/ACT inhaler Inhale 2 puffs into the lungs every  6 (six) hours as needed for wheezing.  Marland Kitchen amLODipine (NORVASC) 10 MG tablet Take 1 tablet (10 mg total) by mouth daily.  Marland Kitchen azelastine (OPTIVAR) 0.05 % ophthalmic solution Place 1 drop into both eyes 2 (two) times daily.  . chlorhexidine (HIBICLENS) 4 % external liquid Apply  topically daily as needed.  . clonazePAM (KLONOPIN) 0.5 MG tablet Take 1 tablet (0.5 mg total) by mouth 3 (three) times daily as needed. for anxiety  . Dentifrices (BIOTENE DRY MOUTH CARE DT) Place onto teeth as needed.  . etonogestrel (IMPLANON) 68 MG IMPL implant 1 each by Subdermal route once.  . fluconazole (DIFLUCAN) 150 MG tablet TAKE 1 TABLET(150 MG) BY MOUTH DAILY FOR 3 DAYS  . fluocinonide cream (LIDEX) 1.85 % Apply 1 application topically 2 (two) times daily.  . hydrocortisone 2.5 % cream Apply topically 2 (two) times daily.  Marland Kitchen ipratropium (ATROVENT) 0.03 % nasal spray Place 2 sprays into both nostrils every 12 (twelve) hours.  Marland Kitchen ketoconazole (NIZORAL) 2 % cream Apply 1 application topically daily.  Marland Kitchen lamoTRIgine (LAMICTAL) 25 MG tablet Take 1 tablet (25 mg total) by mouth daily. Take one tablet daily for a week and then start taking 2 tablets.  . Melatonin 1 MG CAPS Take by mouth.  . mirtazapine (REMERON) 15 MG tablet Take 1 tablet (15 mg total) by mouth at bedtime. Take half a tablet at night for first 3 nights and then one a night  . montelukast (SINGULAIR) 10 MG tablet Take 1 tablet (10 mg total) by mouth at bedtime.  . Multiple Vitamin (MULTIVITAMIN) capsule Take 1 capsule by mouth daily.  . mupirocin cream (BACTROBAN) 2 % Apply 1 application topically 2 (two) times daily.  . naproxen (NAPROSYN) 500 MG tablet Take 1 tablet (500 mg total) by mouth 2 (two) times daily with a meal.  . nystatin (MYCOSTATIN/NYSTOP) 100000 UNIT/GM POWD Apply to irritated vulvar area (not mucous membranes) twice daily for 1 week  . omeprazole (PRILOSEC) 40 MG capsule Take 1 capsule (40 mg total) by mouth daily.  . SUMAtriptan (IMITREX)  25 MG tablet Take 1 tablet (25 mg total) by mouth every 2 (two) hours as needed for migraine. May repeat in 2 hours if headache persists or recurs.  . triamcinolone (KENALOG) 0.025 % ointment Apply 1 application topically 2 (two) times daily.  . [DISCONTINUED] busPIRone (BUSPAR) 10 MG tablet Take twice a day   No facility-administered encounter medications on file as of 03/27/2018.   :  Review of Systems:  Out of a complete 14 point review of systems, all are reviewed and negative with the exception of these symptoms as listed below: Review of Systems  Neurological:       Pt presents today to discuss her vertigo and sleep apnea. Pt did not bring her cpap today. It is 52 years old and she doesn't use it because she is worried that it has bacteria in it. Pt used [facility]. Pt does endorse snoring.  Epworth Sleepiness Scale 0= would never doze 1= slight chance of dozing 2= moderate chance of dozing 3= high chance of dozing  Sitting and reading: 1 Watching TV: 1 Sitting inactive in a public place (ex. Theater or meeting): 2 As a passenger in a car for an hour without a break: 3 Lying down to rest in the afternoon: 2 Sitting and talking to someone: 1 Sitting quietly after lunch (no alcohol): 1 In a car, while stopped in traffic: 0 Total: 11     Objective:  Neurological Exam  Physical Exam Physical Examination:   Vitals:   03/27/18 1602  BP: (!) 138/98  Pulse: 74    General Examination: The patient is a very pleasant 52 y.o. female in no acute distress. She appears well-developed and well-nourished and well groomed.  HEENT: Normocephalic, atraumatic, pupils are equal, round and reactive to light and accommodation. Extraocular tracking is good without limitation to gaze excursion or nystagmus noted. Normal smooth pursuit is noted. Hearing is grossly intact. Face is symmetric with normal facial animation and normal facial sensation. Speech is clear with no dysarthria noted. There is  no hypophonia. There is no lip, neck/head, jaw or voice tremor. Neck is supple with full range of passive and active motion. There are no carotid bruits on auscultation. Oropharynx exam reveals: mild mouth dryness, adequate dental hygiene and moderate airway crowding, due to tonsils are 2+ bilaterally, slightly larger uvula, smaller airway entry. Mallampati is class II. Tongue protrudes centrally and palate elevates symmetrically. Neck circumference is 14-5/8 inches. She has a mild to moderate overbite.  Chest: Clear to auscultation without wheezing, rhonchi or crackles noted.  Heart: S1+S2+0, regular and normal without murmurs, rubs or gallops noted.   Abdomen: Soft, non-tender and non-distended with normal bowel sounds appreciated on auscultation.  Extremities: There is no pitting edema in the distal lower extremities bilaterally.   Skin: Warm and dry without trophic changes noted.  Musculoskeletal: exam reveals no obvious joint deformities, tenderness or joint swelling or erythema.   Neurologically:  Mental status: The patient is awake, alert and oriented in all 4 spheres. Her immediate and remote memory, attention, language skills and fund of knowledge are appropriate. There is no evidence of aphasia, agnosia, apraxia or anomia. Speech is clear with normal prosody and enunciation. Thought process is linear. Mood is normal and affect is normal.  Cranial nerves II - XII are as described above under HEENT exam. In addition: shoulder shrug is normal with equal shoulder height noted. Motor exam: Normal bulk, strength and tone is noted. There is no drift, tremor or rebound. Romberg is negative. Reflexes are 1+ throughout. Babinski: Toes are flexor bilaterally. Fine motor skills and coordination: intact grossly.   Cerebellar testing: No dysmetria or intention tremor on finger to nose testing. Heel to shin is unremarkable bilaterally. There is no truncal or gait ataxia. She is able to walk in high  heels (boots with wedge heels). Sensory exam: intact to light touch.  Gait, station and balance: She stands easily. No veering to one side is noted. No leaning to one side is noted. Posture is age-appropriate and stance is narrow based. Gait shows normal stride length and normal pace. No problems turning are noted. Tandem walk is unremarkable (with shoes off).   Assessment and Plan:   In summary, Hector Venne is a very pleasant 52 y.o.-year old female with an underlying medical history of hypertension, hyperlipidemia, reflux disease, anxiety, depression, asthma, irritable bowel syndrome, migraine headaches, prior smoking, vertigo and overweight state, who Presents for evaluation of her prior diagnosis of obstructive sleep apnea. She was diagnosed in 2012 with what appears to be overall mild obstructive sleep apnea (diagnostic test mentioned was from 01/28/2011 showing an AHI of 6.7 at the time). She had a CPAP titration study in February 2013 and has been using a CPAP machine, but has not used it consistently in the past 3 months she reports. Of note, she has been able to lose weight, in the realm of 20 pounds compared to 2013. We can certainly proceed with diagnostic reevaluation with a sleep study. She would prefer a home sleep test which I ordered. She would be willing to consider CPAP therapy again. We talked about the diagnosis of OSA, its prognosis and treatment options. We talked about medical treatments, surgical  interventions and non-pharmacological approaches. I explained in particular the risks and ramifications of untreated moderate to severe OSA, especially with respect to developing cardiovascular disease down the Road, including congestive heart failure, difficult to treat hypertension, cardiac arrhythmias, or stroke. Even type 2 diabetes has, in part, been linked to untreated OSA. Symptoms of untreated OSA include daytime sleepiness, memory problems, mood irritability and mood disorder such as  depression and anxiety, lack of energy, as well as recurrent headaches, especially morning headaches. We talked about trying to maintain a healthy lifestyle in general, as well as the importance of weight control. I encouraged the patient to eat healthy, exercise daily and keep well hydrated, to keep a scheduled bedtime and wake time routine, to not skip any meals and eat healthy snacks in between meals. I advised the patient not to drive when feeling sleepy.  We also talked about her vertigo today. She has a long-standing history of intermittent vertigo. She has seen ENT for this but I am not sure as to the extent of her workup through ENT. She has a brain MRI pending through your office and she is advised to follow through with this. Reassuringly, her neurological exam is nonfocal. She did ask about sinus disease, she is encouraged to follow-up with ENT for this. She has tried meclizine which caused her to be too sleepy. She is advised that there is no specific medication that addresses vertigo. She has been doing exercises at home which are recommended for positional vertigo. There is no additional testing from my end of things and I explained this to her. I plan to see her back after the sleep study is completed. I answered all her questions today and the patient was in agreement.  Thank you very much for allowing me to participate in the care of this nice patient. If I can be of any further assistance to you please do not hesitate to call me at [phone].  Sincerely,   Star Age, MD, PhD

## 2018-03-31 ENCOUNTER — Other Ambulatory Visit: Payer: Self-pay | Admitting: *Deleted

## 2018-03-31 DIAGNOSIS — K2950 Unspecified chronic gastritis without bleeding: Secondary | ICD-10-CM

## 2018-03-31 MED ORDER — OMEPRAZOLE 40 MG PO CPDR: 40.0000 mg | DELAYED_RELEASE_CAPSULE | Freq: Every day | ORAL | 11 refills | Status: DC

## 2018-04-15 ENCOUNTER — Other Ambulatory Visit: Payer: Self-pay | Admitting: Obstetrics & Gynecology

## 2018-05-07 ENCOUNTER — Other Ambulatory Visit (HOSPITAL_COMMUNITY): Payer: Self-pay | Admitting: Psychiatry

## 2018-05-09 ENCOUNTER — Other Ambulatory Visit (HOSPITAL_COMMUNITY): Payer: Self-pay | Admitting: Psychiatry

## 2018-05-14 ENCOUNTER — Telehealth: Payer: Self-pay

## 2018-05-14 NOTE — Telephone Encounter (Signed)
Pt is calling to inform Dr. Owens Shark that she received her message on mychart. She said that mychart is not letting her respond or choose her as her pcp.   Pt wanted to let Dr. Owens Shark know that she takes her Mirtazapine 5-6 nights a week and the Clonazepam as needed. She does not believe that is the cause of her dizziness.

## 2018-05-14 NOTE — Telephone Encounter (Signed)
Sent MyChart regarding medication questions- mirtazapine and clonazepam may be contributing.

## 2018-05-14 NOTE — Telephone Encounter (Signed)
Patient has appt for 3/30. Was unable to find PCP on MyChart to send a message.  Would like PCP to look at her meds to see if any stand out as having side effect of dizziness or perhaps a combination of them can cause?  Please advise of your thoughts via MyChart message or call.  Danley Danker, RN [facility] RN)

## 2018-05-15 NOTE — Telephone Encounter (Addendum)
Called patient.   1. Recommended monitoring blood pressure during dizzy spells. Discussed options for limiting medications with side effects.   2. Patient requesting FMLA paperwork completed. Patient misses work in the AM and sometimes leaves early due to dizziness. Cannot take medication at work due to drowsiness. She will bring this to the office. Additionally,  recommended patient make an office appointment discuss further.   Bailey Singh, MD  Family Medicine Teaching Service

## 2018-05-18 NOTE — Telephone Encounter (Signed)
Nursing-  Please call patient. The paperwork left in my box has information for the patient about FMLA. There is no area or forms for me to complete. Most FMLA paperwork from employers has a section for the provider to complete. Please ask patient to contact HR about this. Her paperwork (which has information for her) is in the red folder behind the access staff desk.   Dorris Singh, MD  Family Medicine Teaching Service

## 2018-05-19 NOTE — Telephone Encounter (Signed)
Called patient and informed her that the FMLA paperwork was not anything that the PCP was to fill out.  Patient states that she did not read the paperwork prior to faxing it here. After examining it she discovered that it was wrong and will get with HR to get the correct papers.  Bailey Hooper, [place]

## 2018-05-26 NOTE — Telephone Encounter (Signed)
LMOVM informing pt that paper work is upfront to be picked up. Deseree Kennon Holter, CMA

## 2018-05-26 NOTE — Telephone Encounter (Signed)
New FMLA paperwork completed. This is at the front desk, under the letter M. Please call patient and let her know.  Dorris Singh, MD  Family Medicine Teaching Service

## 2018-05-28 ENCOUNTER — Encounter: Payer: Self-pay | Admitting: Family Medicine

## 2018-05-29 ENCOUNTER — Ambulatory Visit (INDEPENDENT_AMBULATORY_CARE_PROVIDER_SITE_OTHER): Payer: BLUE CROSS/BLUE SHIELD | Admitting: Family Medicine

## 2018-05-29 VITALS — BP 130/88 | HR 90 | Temp 98.1°F | Wt 175.0 lb

## 2018-05-29 DIAGNOSIS — L509 Urticaria, unspecified: Secondary | ICD-10-CM

## 2018-05-29 MED ORDER — HYDROXYZINE HCL 50 MG PO TABS: 50.0000 mg | ORAL_TABLET | Freq: Three times a day (TID) | ORAL | 0 refills | Status: DC | PRN

## 2018-05-29 MED ORDER — TRIAMCINOLONE ACETONIDE 0.025 % EX OINT: 1.0000 "application " | TOPICAL_OINTMENT | Freq: Two times a day (BID) | CUTANEOUS | 3 refills | Status: DC

## 2018-05-29 NOTE — Progress Notes (Signed)
   Subjective:    Patient ID: Bailey Hooper    DOB: September 14, 1965, 53 y.o. female   MRN: [id number]  CC: hives   HPI: Bailey Hooper is a 53 y.o. female who presents to clinic today for the following issue.   Hives  Started Sunday morning on her neck and upper back, now arms, chest and legs.  Has been very itchy which has been keeping her up at night.     Has tried taking Benadryl po and using Benadryl ointment but these have not helped much.  Called clinic yesterday and was advised to use OTC hydrocortisone and Claritin 10 mg BID however she has not found much relief with this.  H/o eczema, history of allergies to dust, pollen, dust mites.  No fever, chills, nausea, vomiting, SOB, sensation of throat closing up.  No lip or tongue swelling.  She has a penicillin allergy but denies new medications or foods.  No new lotions or laundry detergents.   ROS: See HPI for pertinent ROS.  Social: pt is a current daily smoker.   Medications reviewed. Objective:   BP 130/88   Pulse 90   Temp 98.1 F (36.7 C) (Oral)   Wt 175 lb (79.4 kg)   SpO2 97%   BMI 28.25 kg/m  Vitals and nursing note reviewed.  General: 53 yo female, NAD   HEENT: NCAT, EOMI, PERRL, MMM, oropharynx clear Neck: supple  CV: RRR no MRG  Lungs: CTAB, normal effort  Abdomen: soft, NTND, +bs  Skin: warm, dry, diffuse hives over back of neck, chest and arms bilaterally - diffusely distributed and pruritic with mild erythema, slightly raised, smooth wheals, cap refill <2 secs  Extremities: warm and well perfused Neuro: alert, oriented x3, no focal deficits   Assessment & Plan:   Hives  No clear trigger per history, exam notable for diffuse hives without red flags.  Has not found relief with po Benadryl or Claritin BID, requests something generic 2/2 insurance issues.  -Rx: hydroxyzine 50 mg TID prn, 30 tabs  -May continue topical hydrocortisone cream on affected areas  -Provided refill of triamcinolone ointment for eczema  -Work  note provided for today  -Return precautions discussed    Meds ordered this encounter  Medications  . triamcinolone (KENALOG) 0.025 % ointment    Sig: Apply 1 application topically 2 (two) times daily.    Dispense:  30 g    Refill:  3  . hydrOXYzine (ATARAX/VISTARIL) 50 MG tablet    Sig: Take 1 tablet (50 mg total) by mouth 3 (three) times daily as needed.    Dispense:  30 tablet    Refill:  0   Lovenia Kim, MD [place] PGY-3

## 2018-05-29 NOTE — Patient Instructions (Signed)
You were seen in clinic for hives due to an unknown trigger.  I have sent in a prescription for hydroxyzine to your pharmacy which you can take three times a day as needed.  I would also recommend continuing the topical hydrocortisone cream on affected areas. As we discussed, if you have any new or worsening symptoms of tongue/throat swelling, shortness of breath or sensation of your throat closing up, you should go to the ER for further evaluation.   If you have any questions please call our clinic.

## 2018-05-31 ENCOUNTER — Encounter: Payer: Self-pay | Admitting: Family Medicine

## 2018-06-02 ENCOUNTER — Telehealth: Payer: Self-pay | Admitting: Family Medicine

## 2018-06-02 ENCOUNTER — Ambulatory Visit (INDEPENDENT_AMBULATORY_CARE_PROVIDER_SITE_OTHER): Payer: BLUE CROSS/BLUE SHIELD | Admitting: Neurology

## 2018-06-02 DIAGNOSIS — G4733 Obstructive sleep apnea (adult) (pediatric): Secondary | ICD-10-CM | POA: Diagnosis not present

## 2018-06-02 DIAGNOSIS — E663 Overweight: Secondary | ICD-10-CM

## 2018-06-02 DIAGNOSIS — R51 Headache: Secondary | ICD-10-CM

## 2018-06-02 DIAGNOSIS — R519 Headache, unspecified: Secondary | ICD-10-CM

## 2018-06-02 DIAGNOSIS — L509 Urticaria, unspecified: Secondary | ICD-10-CM

## 2018-06-02 DIAGNOSIS — G4719 Other hypersomnia: Secondary | ICD-10-CM

## 2018-06-02 MED ORDER — LORATADINE 10 MG PO TABS: 10.0000 mg | ORAL_TABLET | Freq: Every day | ORAL | 11 refills | Status: DC

## 2018-06-02 MED ORDER — HYDROCORTISONE 2.5 % EX OINT: TOPICAL_OINTMENT | CUTANEOUS | 0 refills | Status: DC

## 2018-06-02 MED ORDER — FAMOTIDINE 10 MG PO TABS: 10.0000 mg | ORAL_TABLET | Freq: Two times a day (BID) | ORAL | 1 refills | Status: DC

## 2018-06-02 NOTE — Telephone Encounter (Signed)
Called patient. Discussed persistent urticaria. She is not taking any regular H2 or H1 blocker. Both prescribed. She appears to have developed acute, idiopathic urticaria. Reviewed strict ED precautions.   Bailey Singh, MD  Family Medicine Teaching Service

## 2018-06-03 ENCOUNTER — Other Ambulatory Visit: Payer: Self-pay | Admitting: Family Medicine

## 2018-06-03 ENCOUNTER — Encounter: Payer: Self-pay | Admitting: Family Medicine

## 2018-06-03 DIAGNOSIS — Z1231 Encounter for screening mammogram for malignant neoplasm of breast: Secondary | ICD-10-CM

## 2018-06-04 ENCOUNTER — Telehealth: Payer: Self-pay | Admitting: Family Medicine

## 2018-06-04 DIAGNOSIS — R51 Headache: Principal | ICD-10-CM

## 2018-06-04 DIAGNOSIS — R42 Dizziness and giddiness: Secondary | ICD-10-CM

## 2018-06-04 DIAGNOSIS — G8929 Other chronic pain: Secondary | ICD-10-CM

## 2018-06-04 DIAGNOSIS — R4789 Other speech disturbances: Secondary | ICD-10-CM

## 2018-06-04 NOTE — Telephone Encounter (Signed)
Called patient. MRI was ordered in August for intractable headaches, also had episode of speech change. Headaches associated with vertigo. Given age, persistent symptoms, I continue to recommend imaging. New order placed, number given to [facility]. She will 1. Call insurance to check cost 2. Call [place] to schedule

## 2018-06-05 ENCOUNTER — Encounter: Payer: Self-pay | Admitting: Neurology

## 2018-06-05 ENCOUNTER — Telehealth: Payer: Self-pay

## 2018-06-05 NOTE — Addendum Note (Signed)
Addended by: Star Age on: 06/05/2018 07:52 AM   Modules accepted: Orders

## 2018-06-05 NOTE — Telephone Encounter (Signed)
-----   Message from Star Age, MD sent at 06/05/2018  7:51 AM EST ----- Patient referred by Dr. Owens Shark, seen by me on 03/27/18, HST on 06/02/18.    Please call and notify the patient that the recent home sleep test showed obstructive sleep apnea in the moderate to severe range. While I recommend treatment for this in the form CPAP, she had indicated that she would prefer to do testing at home. I would recommend, we start home autoPAP, which means, that we don't have to bring her in for a sleep study with CPAP, but will let her try an autoPAP machine at home, through a DME company (of her choice, or as per insurance requirement). She has an older CPAP machine, not sure if she has existing DME, pls check.  The DME representative will educate her on how to use the machine, how to put the mask on, etc. I have placed an order in the chart. Please send referral, talk to patient, send report to referring MD. We will need a FU in sleep clinic for 10 weeks post-PAP set up, please arrange that with me or one of our NPs. Thanks,   Star Age, MD, PhD [facility])

## 2018-06-05 NOTE — Progress Notes (Signed)
Patient referred by Dr. Owens Shark, seen by me on 03/27/18, HST on 06/02/18.    Please call and notify the patient that the recent home sleep test showed obstructive sleep apnea in the moderate to severe range. While I recommend treatment for this in the form CPAP, she had indicated that she would prefer to do testing at home. I would recommend, we start home autoPAP, which means, that we don't have to bring her in for a sleep study with CPAP, but will let her try an autoPAP machine at home, through a DME company (of her choice, or as per insurance requirement). She has an older CPAP machine, not sure if she has existing DME, pls check.  The DME representative will educate her on how to use the machine, how to put the mask on, etc. I have placed an order in the chart. Please send referral, talk to patient, send report to referring MD. We will need a FU in sleep clinic for 10 weeks post-PAP set up, please arrange that with me or one of our NPs. Thanks,   Star Age, MD, PhD [facility])

## 2018-06-05 NOTE — Telephone Encounter (Signed)
I called pt. I advised pt that Dr. Rexene Alberts reviewed their sleep study results and found that pt has moderate to severe osa. Dr. Rexene Alberts recommends that pt start an auto pap at home. Pt has a cpap at home that is about 53 years old. I reviewed PAP compliance expectations with the pt. Pt is agreeable to starting an auto-PAP. I advised pt that an order will be sent to a DME, [facility], and [facility] will call the pt within about one week after they file with the pt's insurance. [facility] will show the pt how to use the machine, fit for masks, and troubleshoot the auto-PAP if needed. A follow up appt was made for insurance purposes with Dr. Rexene Alberts on 09/11/2018 at 3:00pm. Pt declined to see the NP. Pt verbalized understanding to arrive 15 minutes early and bring their auto-PAP. Pt verbalized understanding of results. Pt had no questions at this time but was encouraged to call back if questions arise. I have sent the order to [facility] and have received confirmation that they have received the order.

## 2018-06-05 NOTE — Telephone Encounter (Signed)
I called pt to discuss her sleep study results. No answer, left a message asking her to call me back. 

## 2018-06-05 NOTE — Procedures (Signed)
[facility] @[facility] [place], [place] [address] NAME:  Bailey Hooper                                                                           DOB: 24-Dec-1965 MEDICAL RECORD no: [id number]                                                  DOS: 06/02/2018 REFERRING PHYSICIAN: Dorris Singh, MD STUDY PERFORMED: HST on [employer] HISTORY: 53 year old woman with a history of hypertension, hyperlipidemia, reflux disease, anxiety, depression, asthma, irritable bowel syndrome, migraine headaches, prior smoking, vertigo and overweight state, who was previously diagnosed with obstructive sleep apnea and placed on CPAP therapy. She presents for re-evaluation. Her Epworth sleepiness score is 11 out of 24, BMI of 28.7.  STUDY RESULTS:   Total Recording Time:  9 hrs, 25 mins; Total Sleep Time:   8 hrs, 2 mins Total Apnea/Hypopnea Index (AHI): 15.0/h; RDI: 15.3/h; REM AHI: 30.4/h Average Oxygen Saturation:   96%; Lowest Oxygen Desaturation: 80 %  Total Time Oxygen Saturation Below or at 88%: 1.1 minutes  Average Heart Rate:  75 bpm  IMPRESSION: OSA RECOMMENDATION: This home sleep test demonstrates moderate obstructive sleep apnea with a total AHI of 15/hour and O2 nadir of 80%. Given the patient's medical history and sleep related complaints, treatment with positive airway pressure (in the form of CPAP) is recommended. This will require a full night CPAP titration study for proper treatment settings, O2 monitoring and mask fitting. Based on the severity of the sleep disordered breathing an attended titration study is indicated. However, the patient cannot come in for an attended sleep study, as she has young grandchildren at the house. Therefore, the patient will be advised to proceed with an autoPAP titration/trial at home for now. Please note that untreated obstructive sleep apnea may carry additional perioperative morbidity. Patients with significant obstructive sleep apnea  should receive perioperative PAP therapy and the surgeons and particularly the anesthesiologist should be informed of the diagnosis and the severity of the sleep disordered breathing. The patient should be cautioned not to drive, work at heights, or operate dangerous or heavy equipment when tired or sleepy. Review and reiteration of good sleep hygiene measures should be pursued with any patient. Other causes of the patient's symptoms, including circadian rhythm disturbances, an underlying mood disorder, medication effect and/or an underlying medical problem cannot be ruled out based on this test. Clinical correlation is recommended. The patient and her referring provider will be notified of the test results. The patient will be seen in follow up in sleep clinic at [facility]. I certify that I have reviewed the raw data recording prior to the issuance of this report in accordance with the standards of the American Academy of Sleep Medicine (AASM)  Star Age, MD, PhD [facility]) Diplomat, ABPN (Neurology and Sleep)

## 2018-06-11 ENCOUNTER — Encounter: Payer: Self-pay | Admitting: Family Medicine

## 2018-06-23 ENCOUNTER — Encounter: Payer: Self-pay | Admitting: Family Medicine

## 2018-06-30 ENCOUNTER — Telehealth: Payer: Self-pay | Admitting: Family Medicine

## 2018-06-30 DIAGNOSIS — J0101 Acute recurrent maxillary sinusitis: Secondary | ICD-10-CM

## 2018-06-30 MED ORDER — AMOXICILLIN-POT CLAVULANATE 875-125 MG PO TABS: 1.0000 | ORAL_TABLET | Freq: Two times a day (BID) | ORAL | 0 refills | Status: AC

## 2018-06-30 NOTE — Telephone Encounter (Signed)
Attempted to call patient regarding upcoming physical.   Bailey Singh, MD  [facility] Medicine Teaching Service

## 2018-06-30 NOTE — Telephone Encounter (Signed)
Pt returned Dr. Roosvelt Harps phone call. Pt would prefer to keep her upcoming apt on 3/30 with PCP. Pt stated she has HTN, Asthma, and DM and would rather be seen in person. Of note, pt stated she does feel like she has a sinus infection coming on and is wondering if amoxicillin can be called into her pharmacy. If she has to wait until her 3/30 apt that is fine too.

## 2018-06-30 NOTE — Addendum Note (Signed)
Addended by: Owens Shark, Naoma Boxell on: 06/30/2018 04:20 PM   Modules accepted: Orders

## 2018-06-30 NOTE — Telephone Encounter (Signed)
Pt returned call, but had to lm on nurse line.  She states that she is at work and you can leave a message on her VM. Christen Bame, CMA

## 2018-06-30 NOTE — Telephone Encounter (Signed)
[place] Telemedicine Visit Encounter participants: Patient: Bailey Hooper  Provider: Martyn Malay   Chief Complaint: sinus pressure  HPI: Called patient to reschedule upcoming wellness visit. She endorses 5 days of sinus pressure, congestion. She is using Flonase, Nettie pot, and antihistamine. Endorses sinus tenderness and green discharge from nose. No fevers, cough, headache, dyspnea. No recent travel. No contacts with COVID.    ROS: as above  Pertinent PMHx: history of recurrent sinusitis   Assessment/Plan:  Bailey Hooper was seen today for medication management and appointment.  Diagnoses and all orders for this visit:  Acute recurrent maxillary sinusitis -     amoxicillin-clavulanate (AUGMENTIN) 875-125 MG tablet; Take 1 tablet by mouth 2 (two) times daily for 7 days. - Discussed return precautions. Patient sounds well on telephone, able to speak in full sentences, no fevers or severe symptoms. Reviewed reasons to call and return to care.   Time spent on phone with patient: 9 minutes  Not billed.

## 2018-07-07 ENCOUNTER — Encounter: Payer: Self-pay | Admitting: Family Medicine

## 2018-07-10 ENCOUNTER — Telehealth (INDEPENDENT_AMBULATORY_CARE_PROVIDER_SITE_OTHER): Payer: Self-pay | Admitting: Family Medicine

## 2018-07-10 ENCOUNTER — Other Ambulatory Visit: Payer: Self-pay

## 2018-07-10 ENCOUNTER — Telehealth: Payer: Self-pay

## 2018-07-10 ENCOUNTER — Encounter: Payer: Self-pay | Admitting: Family Medicine

## 2018-07-10 DIAGNOSIS — J4520 Mild intermittent asthma, uncomplicated: Secondary | ICD-10-CM

## 2018-07-10 DIAGNOSIS — F411 Generalized anxiety disorder: Secondary | ICD-10-CM

## 2018-07-10 DIAGNOSIS — E785 Hyperlipidemia, unspecified: Secondary | ICD-10-CM

## 2018-07-10 DIAGNOSIS — G8929 Other chronic pain: Secondary | ICD-10-CM

## 2018-07-10 DIAGNOSIS — J301 Allergic rhinitis due to pollen: Secondary | ICD-10-CM

## 2018-07-10 DIAGNOSIS — Z720 Tobacco use: Secondary | ICD-10-CM

## 2018-07-10 DIAGNOSIS — R51 Headache: Secondary | ICD-10-CM

## 2018-07-10 DIAGNOSIS — G43909 Migraine, unspecified, not intractable, without status migrainosus: Secondary | ICD-10-CM

## 2018-07-10 DIAGNOSIS — I10 Essential (primary) hypertension: Secondary | ICD-10-CM

## 2018-07-10 MED ORDER — SUMATRIPTAN SUCCINATE 25 MG PO TABS: 25.0000 mg | ORAL_TABLET | ORAL | 3 refills | Status: DC | PRN

## 2018-07-10 MED ORDER — ALBUTEROL SULFATE HFA 108 (90 BASE) MCG/ACT IN AERS: 2.0000 | INHALATION_SPRAY | Freq: Four times a day (QID) | RESPIRATORY_TRACT | 11 refills | Status: DC | PRN

## 2018-07-10 NOTE — Progress Notes (Signed)
[place] Telemedicine Visit  Patient consented to have visit conducted via telephone.  Encounter participants: Patient: Bailey Hooper  Provider: Martyn Malay    Chief Complaint: annual wellness exam   HPI: Bailey Hooper is a pleasant 53 year old woman with history of anxiety, hypertension, dyslipidemia, GERD and depression presenting today via telemedicine visit for an annual exam.  The patient consented to this telemedicine visit.  The patient reports she recently lost her job.  She is upset about this.  She denies thoughts of hurting herself or others.  She overall feels well.  The patient is very concerned about her dentition.  She is worried that she will develop a blister infection secondary to poor dentition.  She denies tooth pain or dental abscess currently.  She denies fever  The patient is taking her antihypertensive as prescribed.  Patient reports her seasonal allergies are acting up.  She has been using her albuterol inhaler 1-2 times a week.  She requires refill of this.  She denies wheezing, nighttime cough or difficulty breathing. ROS:  Denies headaches, chest pain, difficulty breathing, abdominal pain.   Pertinent PMHx: Mood disorder Hypertension   Exam:  Talkative, no increased work of breathing.  Respiratory: No wheezing.  No upper airway noises.  Talking in full complete sentences  Assessment/Plan:  Diagnoses and all orders for this visit:  Essential hypertension -     Basic Metabolic Panel; Future -     Hemoglobin A1c; Future  Allergic rhinitis due to pollen, recommended antihistamine  Mild intermittent asthma, unspecified whether complicated -     albuterol (PROVENTIL HFA;VENTOLIN HFA) 108 (90 Base) MCG/ACT inhaler; Inhale 2 puffs into the lungs every 6 (six) hours as needed for wheezing.  Dyslipidemia -     Lipid Panel; Future  Annual Wellness Exam We discussed healthy eating habits, moderate physical activity for 30 minutes 5  times per week (or 150 minutes), safe sex practices, avoiding tobacco products, safe alcohol consumption, and safe driving habits.   HCM CSY due in 2021 Mammogram- patient to call Review tobacco cessation- at 2 will need lung CT  UTD on Pap- follows with Gynecology    Time spent on phone with patient: 20 minutes  Dorris Singh, MD  [facility] Medicine Teaching Service

## 2018-07-11 ENCOUNTER — Other Ambulatory Visit: Payer: Self-pay

## 2018-07-11 DIAGNOSIS — I10 Essential (primary) hypertension: Secondary | ICD-10-CM

## 2018-07-11 DIAGNOSIS — E785 Hyperlipidemia, unspecified: Secondary | ICD-10-CM

## 2018-07-12 LAB — LIPID PANEL
Chol/HDL Ratio: 3.8 ratio (ref 0.0–4.4)
Cholesterol, Total: 173 mg/dL (ref 100–199)
HDL: 45 mg/dL (ref >39)
LDL Calculated: 110 mg/dL — ABNORMAL HIGH (ref 0–99)
Triglycerides: 89 mg/dL (ref 0–149)
VLDL Cholesterol Cal: 18 mg/dL (ref 5–40)

## 2018-07-12 LAB — BASIC METABOLIC PANEL
BUN/Creatinine Ratio: 12 (ref 9–23)
BUN: 8 mg/dL (ref 6–24)
CO2: 22 mmol/L (ref 20–29)
Calcium: 8.9 mg/dL (ref 8.7–10.2)
Chloride: 102 mmol/L (ref 96–106)
Creatinine, Ser: 0.67 mg/dL (ref 0.57–1.00)
GFR calc Af Amer: 117 mL/min/{1.73_m2} (ref >59)
GFR calc non Af Amer: 101 mL/min/{1.73_m2} (ref >59)
Glucose: 118 mg/dL — ABNORMAL HIGH (ref 65–99)
Potassium: 4.2 mmol/L (ref 3.5–5.2)
Sodium: 140 mmol/L (ref 134–144)

## 2018-07-12 LAB — HEMOGLOBIN A1C
Est. average glucose Bld gHb Est-mCnc: 117 mg/dL
Hgb A1c MFr Bld: 5.7 % — ABNORMAL HIGH (ref 4.8–5.6)

## 2018-07-14 ENCOUNTER — Encounter: Payer: Self-pay | Admitting: Family Medicine

## 2018-07-14 ENCOUNTER — Ambulatory Visit: Payer: Self-pay | Admitting: Family Medicine

## 2018-07-14 ENCOUNTER — Telehealth: Payer: Self-pay | Admitting: Family Medicine

## 2018-07-14 DIAGNOSIS — E785 Hyperlipidemia, unspecified: Secondary | ICD-10-CM | POA: Insufficient documentation

## 2018-07-14 DIAGNOSIS — E782 Mixed hyperlipidemia: Secondary | ICD-10-CM

## 2018-07-14 MED ORDER — DICYCLOMINE HCL 10 MG PO CAPS: 10.0000 mg | ORAL_CAPSULE | Freq: Three times a day (TID) | ORAL | 0 refills | Status: DC

## 2018-07-14 NOTE — Telephone Encounter (Signed)
Attempted to call patient.   Nursing- Please call patient. Overall, her labs look quite good. Her kidney function, electrolytes, potassium are all normal. Her long term marker for diabetes is even lower than before---which is great---it is 5.7%, normal is 5.6% or less. Her cholesterol is a bit worse than in August--about 20 points higher. I would like to discuss two things with the patient about this: 1. Smoking cessation 2. A new medication  Smoking cessation will be most effective in reducing her risk of hear disease and stroke. Please ask the patient what time would be best to call her tomorrow.   Dorris Singh, MD  Family Medicine Teaching Service    The 10-year ASCVD risk score Mikey Bussing DC Brooke Bonito., et al., 2013) is: 9.2%   Values used to calculate the score:     Age: 53 years     Sex: Female     Is Non-Hispanic African American: Yes     Diabetic: No     Tobacco smoker: Yes     Systolic Blood Pressure: 175 mmHg     Is BP treated: Yes     HDL Cholesterol: 45 mg/dL     Total Cholesterol: 173 mg/dL

## 2018-07-14 NOTE — Telephone Encounter (Signed)
Called patient regarding test results. After much discussion, patient would like to try smoking cessation rather than statin. Agree this would be best. Discussed quit plan (quit line, patient will start jar with cigarettes she smokes, starting gum).  Will call in 3 weeks to check in on quitting.  Dorris Singh, MD  Family Medicine Teaching Service

## 2018-07-14 NOTE — Telephone Encounter (Signed)
Attempted to call pt, not answer. Will try again later. Deseree Kennon Holter, CMA

## 2018-07-23 ENCOUNTER — Telehealth: Payer: Self-pay | Admitting: Family Medicine

## 2018-07-23 NOTE — Telephone Encounter (Signed)
[place] Telephone Visit   Bailey Hooper is a pleasant 53 year old with history of anxiety and IBS calling with worsening spasms and abdominal pain.  She reports she is tried senna and a multitude of over-the-counter remedies for constipation.  She reports her main concern is that her gallbladder may be acting up.  She has read about the symptoms of this and believes her gallbladder may be the problem.  She denies fevers, right upper quadrant pain nausea or vomiting.  She reports her primary concerns are her constipation, bloating and halitosis.  She is eating and drinking well.  We reviewed some of the signs and symptoms of cholelithiasis  We also reviewed dietary triggers.  She also thinks this could be a flare of her IBS.  She denies melena or hematochezia.  She is able to keep down food and fluids.   IBS Flare discussed symptomatic treatments at length I recommend keeping a food diary and will call her back later this week.  Dorris Singh, MD  Family Medicine Teaching Service

## 2018-07-23 NOTE — Telephone Encounter (Signed)
Attempted to call patient to check in.    Dorris Singh, MD  Family Medicine Teaching Service

## 2018-07-23 NOTE — Telephone Encounter (Signed)
Pt returns call, Dr. Owens Shark unavailable.  You can call her back @ [place], CMA

## 2018-07-24 ENCOUNTER — Encounter: Payer: Self-pay | Admitting: Family Medicine

## 2018-07-25 ENCOUNTER — Telehealth: Payer: Self-pay

## 2018-08-04 ENCOUNTER — Encounter: Payer: Self-pay | Admitting: Obstetrics & Gynecology

## 2018-08-05 ENCOUNTER — Encounter: Payer: Self-pay | Admitting: Family Medicine

## 2018-08-05 ENCOUNTER — Telehealth: Payer: Self-pay | Admitting: Family Medicine

## 2018-08-05 DIAGNOSIS — R220 Localized swelling, mass and lump, head: Secondary | ICD-10-CM

## 2018-08-05 DIAGNOSIS — K047 Periapical abscess without sinus: Secondary | ICD-10-CM

## 2018-08-05 MED ORDER — AMOXICILLIN-POT CLAVULANATE 875-125 MG PO TABS: 1.0000 | ORAL_TABLET | Freq: Two times a day (BID) | ORAL | 0 refills | Status: DC

## 2018-08-05 NOTE — Telephone Encounter (Signed)
[place] patient to check in regarding smoking cessation and dental problem.   Patient has had ongoing dental issues since fall 2019. She reports a one week history of left sided dental pain, swelling, and headaches.  Her left superior maxillary side has a partial and she is edentulous.  She denies fevers, nausea, vomiting, difficulty swallowing.  She is tolerating liquids well and eating soft foods.  She reports that whenever her tooth acts up her stomach also acts up.  Reviewed risks and return precautions.  Prescribed Augmentin for course of 7 days given concern for dental infection.  We discussed that the management of this should include a full dental evaluation.  Referral was placed

## 2018-09-04 ENCOUNTER — Telehealth: Payer: Self-pay | Admitting: Neurology

## 2018-09-04 NOTE — Telephone Encounter (Signed)
Due to current COVID 19 pandemic, our office is severely reducing in office visits until further notice, in order to minimize the risk to our patients and healthcare providers.   Called patient regarding her 6/4 appt for auto pap follow-up. Patient states that she has not started the auto pap and does not plan to at this time due to the cost. She states that she turned up the pressure on her cpap machine and reports that this has been working for her. I told pt that this appt would be cancelled and to call back if needed.

## 2018-09-04 NOTE — Telephone Encounter (Signed)
I called pt. She is unable to afford the auto pap right now. She adjusted her cpap pressure and she reports that it is working well. I advised her against adjusting her cpap pressure without a doctor's order. I advised her that she will need at least a yearly follow up with Dr. Rexene Alberts to continue getting cpap supply orders. Pt says that she will call her new insurance company today to find out if she can have a new auto pap and will let us know. Pt verbalized understanding of recommendations.

## 2018-09-11 ENCOUNTER — Ambulatory Visit: Payer: Self-pay | Admitting: Neurology

## 2018-09-25 ENCOUNTER — Encounter: Payer: Self-pay | Admitting: Family Medicine

## 2018-09-26 ENCOUNTER — Ambulatory Visit: Payer: Self-pay | Admitting: Internal Medicine

## 2018-10-01 ENCOUNTER — Other Ambulatory Visit: Payer: Self-pay | Admitting: Obstetrics & Gynecology

## 2018-10-01 NOTE — Telephone Encounter (Addendum)
Overdue for annual since March.Rosemarie Ax called patient to schedule CE but patient said she cannot afford to come for annual visit.

## 2018-10-13 ENCOUNTER — Ambulatory Visit: Payer: Self-pay

## 2018-10-15 ENCOUNTER — Telehealth (INDEPENDENT_AMBULATORY_CARE_PROVIDER_SITE_OTHER): Payer: Self-pay | Admitting: Internal Medicine

## 2018-10-15 ENCOUNTER — Other Ambulatory Visit: Payer: Self-pay

## 2018-10-15 DIAGNOSIS — I10 Essential (primary) hypertension: Secondary | ICD-10-CM

## 2018-10-15 DIAGNOSIS — F4310 Post-traumatic stress disorder, unspecified: Secondary | ICD-10-CM

## 2018-10-15 DIAGNOSIS — F419 Anxiety disorder, unspecified: Secondary | ICD-10-CM

## 2018-10-15 DIAGNOSIS — F319 Bipolar disorder, unspecified: Secondary | ICD-10-CM

## 2018-10-15 DIAGNOSIS — K219 Gastro-esophageal reflux disease without esophagitis: Secondary | ICD-10-CM

## 2018-10-15 DIAGNOSIS — J3089 Other allergic rhinitis: Secondary | ICD-10-CM

## 2018-10-15 DIAGNOSIS — K589 Irritable bowel syndrome without diarrhea: Secondary | ICD-10-CM

## 2018-10-15 DIAGNOSIS — G43909 Migraine, unspecified, not intractable, without status migrainosus: Secondary | ICD-10-CM

## 2018-10-15 DIAGNOSIS — J45909 Unspecified asthma, uncomplicated: Secondary | ICD-10-CM

## 2018-10-15 DIAGNOSIS — G4733 Obstructive sleep apnea (adult) (pediatric): Secondary | ICD-10-CM

## 2018-10-15 MED ORDER — AMLODIPINE BESYLATE 10 MG PO TABS: 10.0000 mg | ORAL_TABLET | Freq: Every day | ORAL | 11 refills | Status: DC

## 2018-10-15 MED ORDER — MIRTAZAPINE 30 MG PO TABS: ORAL_TABLET | ORAL | 11 refills | Status: DC

## 2018-10-15 MED ORDER — AZELASTINE HCL 0.05 % OP SOLN: 1.0000 [drp] | Freq: Two times a day (BID) | OPHTHALMIC | 12 refills | Status: AC

## 2018-10-15 MED ORDER — MONTELUKAST SODIUM 10 MG PO TABS: 10.0000 mg | ORAL_TABLET | Freq: Every day | ORAL | 11 refills | Status: DC

## 2018-10-15 MED ORDER — MIRTAZAPINE 30 MG PO TABS: 15.0000 mg | ORAL_TABLET | Freq: Every day | ORAL | 11 refills | Status: DC

## 2018-10-15 MED ORDER — ALBUTEROL SULFATE HFA 108 (90 BASE) MCG/ACT IN AERS: 2.0000 | INHALATION_SPRAY | Freq: Four times a day (QID) | RESPIRATORY_TRACT | 2 refills | Status: DC | PRN

## 2018-10-15 MED ORDER — IPRATROPIUM BROMIDE 0.03 % NA SOLN: 2.0000 | Freq: Two times a day (BID) | NASAL | 6 refills | Status: DC

## 2018-10-15 MED ORDER — FLUCONAZOLE 150 MG PO TABS: ORAL_TABLET | ORAL | 0 refills | Status: DC

## 2018-10-15 MED ORDER — SUMATRIPTAN SUCCINATE 25 MG PO TABS: ORAL_TABLET | ORAL | 3 refills | Status: DC

## 2018-10-15 MED ORDER — CLONAZEPAM 1 MG PO TABS: ORAL_TABLET | ORAL | 0 refills | Status: DC

## 2018-10-15 MED ORDER — DICYCLOMINE HCL 20 MG PO TABS: ORAL_TABLET | ORAL | 1 refills | Status: DC

## 2018-10-15 MED ORDER — OMEPRAZOLE 40 MG PO CPDR: DELAYED_RELEASE_CAPSULE | ORAL | 11 refills | Status: DC

## 2018-10-15 NOTE — Progress Notes (Signed)
Subjective:    Patient ID: Bailey Hooper, female   DOB: 05/13/1965, 53 y.o.   MRN: [id number]   HPI   Virtual Video visit via [employer] New patient establishing  1. Needs med refilled:  Claritin, Bentyl, Ipratropium nasal spray, Mirtazepine and Clonazepam:  The latter using 6 - 8 times weekly. Needs Amlodipine, Albuterol, Optivar, Singulair, omeprazole.  Does not currently need Imitrex.   2.  PTSD, Depression and Anxiety, Insomnia:  Is not going back to Dr. Toy Care right now as she lost her insurance.  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 Used to go to a counselor who diagnosed her with PTSD and Bipolar Disorder prior to Dr. Toy Care on [address].  She has had manic episodes in the past.  She believes she had her last episode on medication back 05/2018. Has been to [facility] before Dr. Toy Care where she was prescribed Seroquel and did not do well with this.  Has been on Zoloft, Wellbutrin, Prozac, Lamictal (had hives with Lamictal) She refuses to return to [place].    3.  OSA on CPAP with nasal mask:  She thinks her pressure is set at 21 mm and 17 mm Hg.  4.  Feels she needs Fluconazole.  Has been eating a lot of sweets with pandemic.  Was told she is borderline diabetic as well.  She is having a vaginal white discharge with itching.  No odor.  Currently using Miconazole vaginal cream for 7 days.  Helped with the itching and decreased the discharge, but still there.  She has returned to a healthier food intake in past 2 weeks. She is doing okay with food access.  She does have unemployment, however.  5.  Migraines:  Takes Imitrex maybe 6-7 times yearly.  6.  Hypertension:  Taking Amlodipine   Current Meds  Medication Sig  . albuterol (PROVENTIL HFA;VENTOLIN HFA) 108 (90 Base) MCG/ACT inhaler Inhale 2 puffs into the lungs every 6 (six) hours as needed for wheezing.  Marland Kitchen amLODipine (NORVASC) 10 MG tablet Take 1 tablet (10 mg total) by mouth daily.  Marland Kitchen  azelastine (OPTIVAR) 0.05 % ophthalmic solution Place 1 drop into both eyes 2 (two) times daily.  . chlorhexidine (HIBICLENS) 4 % external liquid Apply topically daily as needed.  . clonazePAM (KLONOPIN) 0.5 MG tablet Take 1 tablet (0.5 mg total) by mouth 3 (three) times daily as needed. for anxiety  . etonogestrel (IMPLANON) 68 MG IMPL implant 1 each by Subdermal route once.   . fluocinonide cream (LIDEX) 9.23 % Apply 1 application topically 2 (two) times daily.  Marland Kitchen loratadine (CLARITIN) 10 MG tablet Take 1 tablet (10 mg total) by mouth daily.  . Melatonin 1 MG CAPS Take by mouth.  . mirtazapine (REMERON) 15 MG tablet Take 1 tablet (15 mg total) by mouth at bedtime. Take half a tablet at night for first 3 nights and then one a night  . montelukast (SINGULAIR) 10 MG tablet Take 1 tablet (10 mg total) by mouth at bedtime.  . Multiple Vitamin (MULTIVITAMIN) capsule Take 1 capsule by mouth daily.  Marland Kitchen omeprazole (PRILOSEC) 40 MG capsule Take 1 capsule (40 mg total) by mouth daily.  . SUMAtriptan (IMITREX) 25 MG tablet Take 1 tablet (25 mg total) by mouth every 2 (two) hours as needed for migraine. May repeat in 2 hours if headache persists or recurs.  . triamcinolone (KENALOG) 0.025 % ointment Apply 1 application topically 2 (two) times daily.  Allergies  Allergen Reactions  . Lisinopril Palpitations and Cough    Per pt report  . Lamictal [Lamotrigine] Hives  . Penicillins Rash    Vesicles, denies SOB or breathing issues  . Erythromycin Nausea And Vomiting   Past Medical History:  Diagnosis Date  . Anxiety   . Asthma   . Depression   . GERD (gastroesophageal reflux disease)   . High cholesterol   . Hypertension   . IBS (irritable bowel syndrome)   . Migraines   . Sleep apnea   . Tobacco use   . Vertigo    Past Surgical History:  Procedure Laterality Date  . CESAREAN SECTION    . FOOT SURGERY     Left foot   Family History  Problem Relation Age of Onset  . Colon cancer Unknown         Father  . Cancer Unknown        Oral, uncle  . Alcohol abuse Unknown        Father  . Drug abuse Unknown        Father  . Osteoporosis Unknown   . Depression Unknown   . Hypertension Unknown   . Diabetes Unknown   . Heart disease Unknown        Grandmother  . Colon cancer Father   . Esophageal cancer Neg Hx       Review of Systems    Objective:   There were no vitals taken for this visit.  Physical Exam  Patient appears well    Assessment & Plan  1.  Bipolar Disorder/Anxiety/PTSD:  Discussed I do not use benzodiazepines long term.  One time fill before getting Dr.  Starleen Arms records  Has appt in August. Mirtazepine 30 mg at bedtime as well. No current mood stabilizer. She will likely need psychiatry, which may be difficult as she refuses to return to [facility].  2.  Hypertension:  Amlodipine.  3.  Asthma and allergies:  Meds filled.  4.  GERD:  Omeprazole  5.  IBS:  Dicyclomine.  6.  Migraines:  Changed mind on Rx for Imitrex.

## 2018-10-15 NOTE — Patient Instructions (Signed)
[facility]  Directions  Website Address: [address], [place], [place] [address]  Phone: [phone]  [address] [place], [place]  [address] [phone]

## 2018-10-16 ENCOUNTER — Telehealth: Payer: Self-pay | Admitting: Internal Medicine

## 2018-10-16 NOTE — Telephone Encounter (Signed)
Bailey Hooper; Pharmacist from [facility] [phone] called requesting authorization from Dr. Amil Amen on Imitrex 50 instead of 25 mg. She states they only carries the 50 mg ones and cannot be cutting in half. They will like to know if they can fill it with the new strength. She states does not need a new prescription from Dr. Amil Amen to be sent out to them. Spoke with Dr. Amil Amen who authorized new doses on medication.

## 2018-11-03 ENCOUNTER — Ambulatory Visit: Payer: Self-pay | Admitting: Internal Medicine

## 2018-11-19 ENCOUNTER — Other Ambulatory Visit: Payer: Self-pay

## 2018-11-19 ENCOUNTER — Encounter: Payer: Self-pay | Admitting: Internal Medicine

## 2018-11-19 ENCOUNTER — Telehealth: Payer: Self-pay | Admitting: Internal Medicine

## 2018-11-19 ENCOUNTER — Ambulatory Visit (INDEPENDENT_AMBULATORY_CARE_PROVIDER_SITE_OTHER): Payer: Self-pay | Admitting: Internal Medicine

## 2018-11-19 VITALS — BP 150/88 | HR 68 | Temp 98.5°F | Resp 12 | Ht 65.25 in | Wt 182.0 lb

## 2018-11-19 DIAGNOSIS — I10 Essential (primary) hypertension: Secondary | ICD-10-CM

## 2018-11-19 DIAGNOSIS — K029 Dental caries, unspecified: Secondary | ICD-10-CM

## 2018-11-19 DIAGNOSIS — Z1239 Encounter for other screening for malignant neoplasm of breast: Secondary | ICD-10-CM

## 2018-11-19 DIAGNOSIS — J45909 Unspecified asthma, uncomplicated: Secondary | ICD-10-CM

## 2018-11-19 DIAGNOSIS — J3089 Other allergic rhinitis: Secondary | ICD-10-CM

## 2018-11-19 DIAGNOSIS — R42 Dizziness and giddiness: Secondary | ICD-10-CM

## 2018-11-19 DIAGNOSIS — F319 Bipolar disorder, unspecified: Secondary | ICD-10-CM

## 2018-11-19 DIAGNOSIS — K1379 Other lesions of oral mucosa: Secondary | ICD-10-CM

## 2018-11-19 DIAGNOSIS — R011 Cardiac murmur, unspecified: Secondary | ICD-10-CM

## 2018-11-19 MED ORDER — CHLORTHALIDONE 25 MG PO TABS: ORAL_TABLET | ORAL | 6 refills | Status: DC

## 2018-11-19 NOTE — Telephone Encounter (Signed)
Patient called  requesting a mammogram referral.  To Dr. Amil Amen for approval and issue referral.

## 2018-11-19 NOTE — Progress Notes (Signed)
Subjective:    Patient ID: Bailey Hooper, female   DOB: 09/11/1965, 53 y.o.   MRN: [id number]   HPI   1.  Hypertension:  Taking Amlodipine.  States BP running from 138/88 to 170/90.   Cough with Lisinopril Headaches with HCTZ. Has never taken Chlorthalidone. Feet get puffy from time to time.    2.  Soreness in mouth back 2 weeks ago and concerned she had thrush as could not wear her partial.   Gargled with salt water and went away.  3.  Bipolar Affective Disorder:  Taking Mirtazepine nightly.  Did not call any of the psychiatry recommendations we discussed with her virtual visit.    4.  Vertigo:  Has had for years.  Sent to [place] 5 years ago.  Describes [employer] maneuver exercises with neuro PT.  She did not feel this helped, but continued at home on her own and went away for a prolonged period of time.  If lying on right or bends forward, gets dizzy.    Current Meds  Medication Sig  . amLODipine (NORVASC) 10 MG tablet Take 1 tablet (10 mg total) by mouth daily.  Marland Kitchen azelastine (OPTIVAR) 0.05 % ophthalmic solution Place 1 drop into both eyes 2 (two) times daily.  . clonazePAM (KLONOPIN) 1 MG tablet 1/2 tab by mouth as needed every 12 hours.  Marland Kitchen dicyclomine (BENTYL) 20 MG tablet 1/2 to 1 tab 3 times daily before meals and at bedtime  . etonogestrel (IMPLANON) 68 MG IMPL implant 1 each by Subdermal route once.   . fluconazole (DIFLUCAN) 150 MG tablet 1 tab by mouth daily for 3 days.  . Melatonin 1 MG CAPS Take by mouth.  . mirtazapine (REMERON) 30 MG tablet 1 tab by mouth daily at bedtime  . montelukast (SINGULAIR) 10 MG tablet Take 1 tablet (10 mg total) by mouth at bedtime.  . Multiple Minerals-Vitamins (CAL MAG ZINC +D3) TABS Take 1 tablet by mouth daily.  . Multiple Vitamin (MULTIVITAMIN) capsule Take 1 capsule by mouth daily.  Marland Kitchen omeprazole (PRILOSEC) 40 MG capsule 1 cap by mouth on empty stomach daily   Allergies  Allergen Reactions  . Lisinopril Palpitations and Cough    Per  pt report  . Lamictal [Lamotrigine] Hives  . Penicillins Rash    Vesicles, denies SOB or breathing issues  . Erythromycin Nausea And Vomiting     Review of Systems    Objective:   BP (!) 150/88 (BP Location: Right Arm, Patient Position: Sitting, Cuff Size: Normal)   Pulse 68   Temp 98.5 F (36.9 C) (Temporal)   Resp 12   Ht 5' 5.25" (1.657 m)   Wt 182 lb (82.6 kg)   BMI 30.05 kg/m   Physical Exam  HEENT:  PERRL, EOMI, Discs sharp, conjunctivae without injection.  TMs pearly gray.  Nasal mucosa boggy, throat with mild cobbling.  Dental decay. Neck:  Supple, No adenopathy, no thyromegaly Chest: CTA CV:  RRR with normal S1 and S2, No S3, S4.  Grade II-III/VI SEM left sternal area radiating to right second interspace and carotids.  Murmur also heard with systole in left axillary area as well--Grade I-II.  Carotid, radial and DP pulses normal and equal Abd:  S, NT, No HSM or mass, + BS LE:  Trace edema of feet. Neuro:  A & O x 3, CN  II-XII grossly intact.  Motor 5/5, DTRs 2+/4.  Finger to nose to finger, rapid alternating movements intact.  Romberg negative.  Mild sense of vertigo sitting up from [facility] and looking to right. Gait normal  Assessment & Plan  1.  Hypertension and mild peripheral edema:  Add [place] to Amlodipine.  2.  Reported Bipolar Disorder/PTSD/Anxiety:  Encouraged patient to get in touch with recommendations from previous discussion.  She refuses to return to [facility] and cannot afford Dr. Toy Care. Continues on Mirtazepine.  3.  Allergies, which may be element of #4:  Continue Azelastine, Montelukast.  Add Cetirizine 10 mg daily as not controlled.  4.  Vertigo: mild.  Likely benign positional vertigo.  Will see if repeat Christella Noa helps.  5.  Heart Murmur(s):  No history of echo evaluation.  Referral to cardiology.  6.  Dental Decay:  Dental referral.  7.  HM:  Mammogram.  To call for flu vaccin in Sep/Oct.

## 2018-11-19 NOTE — Clinical Social Work Note (Signed)
Patient was given PHQ-9, GAD-7 and SDOH screenings.  Patient stated she has been unemployed and although she is receiving unemployment benefits she is concerned about finances due to the reduction of benefits and her inability to find employment. Patient is experiencing food insecurity and concern about not affording bills and transportation needs once unemployment runs out.  Patient stated that she is experiencing symptoms of depression and anxiety (PHQ-9/GAD-7 screenings entered 11/19/2018). Patient has scheduled appointment with counselor.

## 2018-11-26 ENCOUNTER — Telehealth: Payer: Self-pay | Admitting: Internal Medicine

## 2018-11-26 NOTE — Telephone Encounter (Signed)
Patient called requesting Zyrtec to be called in at [facility] as was told by Dr. Amil Amen; also patient states has one side of her face swollen since yesterday due to dental pain and will like to be prescribed with any medication that can help or mouthwash. Patient ok with scheduled appt. To be seen for this matter if it  is necessary.   Please advise.

## 2018-12-01 ENCOUNTER — Other Ambulatory Visit: Payer: Self-pay

## 2018-12-01 ENCOUNTER — Encounter (HOSPITAL_COMMUNITY): Payer: Self-pay

## 2018-12-01 ENCOUNTER — Ambulatory Visit (HOSPITAL_COMMUNITY)
Admission: EM | Admit: 2018-12-01 | Discharge: 2018-12-01 | Disposition: A | Discharge status: Home / Self Care | Payer: Self-pay | Attending: Family Medicine | Admitting: Family Medicine

## 2018-12-01 DIAGNOSIS — K0889 Other specified disorders of teeth and supporting structures: Secondary | ICD-10-CM

## 2018-12-01 DIAGNOSIS — K047 Periapical abscess without sinus: Secondary | ICD-10-CM

## 2018-12-01 MED ORDER — CLINDAMYCIN HCL 150 MG PO CAPS: 150.0000 mg | ORAL_CAPSULE | Freq: Three times a day (TID) | ORAL | 1 refills | Status: DC

## 2018-12-01 MED ORDER — IBUPROFEN 800 MG PO TABS: 800.0000 mg | ORAL_TABLET | Freq: Three times a day (TID) | ORAL | 0 refills | Status: DC

## 2018-12-01 MED ORDER — HYDROCODONE-ACETAMINOPHEN 5-325 MG PO TABS: 1.0000 | ORAL_TABLET | Freq: Four times a day (QID) | ORAL | 0 refills | Status: DC | PRN

## 2018-12-01 MED ORDER — CETIRIZINE HCL 10 MG PO TABS: 10.0000 mg | ORAL_TABLET | Freq: Every day | ORAL | 3 refills | Status: DC

## 2018-12-01 NOTE — ED Triage Notes (Signed)
Pt states she has a bad tooth.  Pt states she has a bad taste in her mouth.

## 2018-12-01 NOTE — Telephone Encounter (Signed)
Patient called back and states she will go to [facility] because she can not wait. Will just apply for financial assistance.

## 2018-12-01 NOTE — Discharge Instructions (Addendum)
Take the antibiotic 3 times a day I have given you a 10-day supply +1 refill in case it is needed Take 2 doses before you go to bed tonight Take ibuprofen 3 times a day with food.  This is for pain and inflammation.  You may use either this or your Naprosyn If the pain is severe you may take hydrocodone.  Do not drive on hydrocodone.  Caution drowsiness Call and get an appointment with a dentist as soon as you are able

## 2018-12-01 NOTE — Telephone Encounter (Signed)
Spoke with patient states she is having really bad dental pain and needs to be seen today. Patient states she called urgent care but it would be over $300 for her visit. Patient was informed our schedule is full and we could put her on a waiting list and call if we had any cancellations. Informed patient would call if 3 pm did not show to bring her in. Patient verbalized understanding.

## 2018-12-01 NOTE — ED Provider Notes (Signed)
[facility]    CSN: [id number] Arrival date & time: 12/01/18  1557      History   Chief Complaint Chief Complaint  Patient presents with   Dental Pain    HPI Bailey Hooper is a 53 y.o. female.   HPI  Patient states that she is having trouble with her "eyetooth" in the left upper mandible.  Swelling of the gums surrounding.  Of a bad taste in the area.  Very painful.  She does not have a dentist.  She is here hoping for pain management and antibiotics.  Past Medical History:  Diagnosis Date   Anxiety    Asthma    Depression    GERD (gastroesophageal reflux disease)    High cholesterol    Hypertension    IBS (irritable bowel syndrome)    Migraines    Sleep apnea    Tobacco use    Vertigo     Patient Active Problem List   Diagnosis Date Noted   Hyperlipidemia 07/14/2018   Tobacco abuse 11/29/2017   Benign paroxysmal positional vertigo 11/29/2017   Major depressive disorder, recurrent episode, moderate ([place]) 06/28/2009   BOILS, RECURRENT 06/24/2009   Acute bacterial sinusitis 05/06/2009   Allergic rhinitis 03/10/2009   ASTHMA 02/11/2009   GENITAL HERPES 01/17/2009   Generalized anxiety disorder 01/17/2009   Migraine headache 01/17/2009   Essential hypertension 01/17/2009   GERD 01/17/2009   HIATAL HERNIA 01/17/2009   Irritable bowel syndrome 01/17/2009    Past Surgical History:  Procedure Laterality Date   CESAREAN SECTION     FOOT SURGERY     Left foot    OB History    Gravida  2   Para  2   Term      Preterm  2   AB      Living  1     SAB      TAB      Ectopic  0   Multiple      Live Births               Home Medications    Prior to Admission medications   Medication Sig Start Date End Date Taking? Authorizing Provider  amLODipine (NORVASC) 10 MG tablet Take 1 tablet (10 mg total) by mouth daily. 10/15/18   Mack Hook, MD  azelastine (OPTIVAR) 0.05 % ophthalmic solution Place 1  drop into both eyes 2 (two) times daily. 10/15/18   Mack Hook, MD  cetirizine (ZYRTEC) 10 MG tablet Take 1 tablet (10 mg total) by mouth daily. 12/01/18   Raylene Everts, MD  chlorthalidone (HYGROTON) 25 MG tablet 1/2 tab by mouth every morning 11/19/18   Mack Hook, MD  clindamycin (CLEOCIN) 150 MG capsule Take 1 capsule (150 mg total) by mouth 3 (three) times daily. 12/01/18   Raylene Everts, MD  clonazePAM (KLONOPIN) 1 MG tablet 1/2 tab by mouth as needed every 12 hours. 10/15/18   Mack Hook, MD  Dentifrices ([place] [facility]) Place onto teeth as needed.    [provider]  dicyclomine (BENTYL) 20 MG tablet 1/2 to 1 tab 3 times daily before meals and at bedtime 10/15/18   Mack Hook, MD  etonogestrel (IMPLANON) 68 MG IMPL implant 1 each by Subdermal route once.     [provider]  fluconazole (DIFLUCAN) 150 MG tablet 1 tab by mouth daily for 3 days. 10/15/18   Mack Hook, MD  fluocinonide cream (LIDEX) 0.05 % Apply  1 application topically 2 (two) times daily.    [provider]  HYDROcodone-acetaminophen (NORCO/VICODIN) 5-325 MG tablet Take 1-2 tablets by mouth every 6 (six) hours as needed. 12/01/18   Raylene Everts, MD  ibuprofen (ADVIL) 800 MG tablet Take 1 tablet (800 mg total) by mouth 3 (three) times daily. 12/01/18   Raylene Everts, MD  ipratropium (ATROVENT) 0.03 % nasal spray Place 2 sprays into both nostrils every 12 (twelve) hours. 10/15/18   Mack Hook, MD  ketoconazole (NIZORAL) 2 % cream Apply 1 application topically daily.    [provider]  Melatonin 1 MG CAPS Take by mouth.    [provider]  mirtazapine (REMERON) 30 MG tablet 1 tab by mouth daily at bedtime 10/15/18   Mack Hook, MD  montelukast (SINGULAIR) 10 MG tablet Take 1 tablet (10 mg total) by mouth at bedtime. 10/15/18   Mack Hook, MD  Multiple Minerals-Vitamins (CAL MAG ZINC +D3) TABS Take 1  tablet by mouth daily.    [provider]  Multiple Vitamin (MULTIVITAMIN) capsule Take 1 capsule by mouth daily.    [provider]  omeprazole (PRILOSEC) 40 MG capsule 1 cap by mouth on empty stomach daily 10/15/18   Mack Hook, MD  SUMAtriptan (IMITREX) 25 MG tablet 1 tab by mouth as needed for headache may repeat in 2 hours if headache persists max 100 mg/24 h 10/15/18   Mack Hook, MD  albuterol (VENTOLIN HFA) 108 (90 Base) MCG/ACT inhaler Inhale 2 puffs into the lungs every 6 (six) hours as needed for wheezing. Patient not taking: Reported on 11/19/2018 10/15/18 12/01/18  Mack Hook, MD  loratadine (CLARITIN) 10 MG tablet Take 1 tablet (10 mg total) by mouth daily. Patient not taking: Reported on 11/19/2018 06/02/18 12/01/18  Martyn Malay, MD    Family History Family History  Problem Relation Age of Onset   Colon cancer Other        Father   Cancer Other        Oral, uncle   Alcohol abuse Other        Father   Drug abuse Other        Father   Osteoporosis Other    Depression Other    Hypertension Other    Diabetes Other    Heart disease Other        Grandmother   Colon cancer Father    Esophageal cancer Neg Hx     Social History Social History   Tobacco Use   Smoking status: Current Every Day Smoker    Packs/day: 0.25    Years: 29.00    Pack years: 7.25    Types: Cigarettes    Start date: 04/09/1986   Smokeless tobacco: Never Used   Tobacco comment: Previous 0.5 PPD. trying to quitt. Does not smoke when sick  Substance Use Topics   Alcohol use: Yes    Alcohol/week: 0.0 standard drinks   Drug use: No     Allergies   Lisinopril, Lamictal [lamotrigine], Penicillins, and Erythromycin   Review of Systems Review of Systems  Constitutional: Negative for chills and fever.  HENT: Positive for dental problem. Negative for ear pain and sore throat.   Eyes: Negative for pain and visual disturbance.  Respiratory:  Negative for cough and shortness of breath.   Cardiovascular: Negative for chest pain and palpitations.  Gastrointestinal: Negative for abdominal pain and vomiting.  Genitourinary: Negative for dysuria and hematuria.  Musculoskeletal: Negative for arthralgias and back  pain.  Skin: Negative for color change and rash.  Neurological: Negative for seizures and syncope.  All other systems reviewed and are negative.    Physical Exam Triage Vital Signs ED Triage Vitals  Enc Vitals Group     BP 12/01/18 1638 (!) 165/97     Pulse Rate 12/01/18 1638 85     Resp 12/01/18 1638 18     Temp 12/01/18 1638 98.7 F (37.1 C)     Temp Source 12/01/18 1638 Oral     SpO2 12/01/18 1638 100 %     Weight 12/01/18 1641 183 lb (83 kg)     Height --      Head Circumference --      Peak Flow --      Pain Score 12/01/18 1745 5     Pain Loc --      Pain Edu? --      Excl. in [place]? --    No data found.  Updated Vital Signs BP (!) 165/97 (BP Location: Right Arm)    Pulse 85    Temp 98.7 F (37.1 C) (Oral)    Resp 18    Wt 83 kg    SpO2 100%    BMI 30.22 kg/m    Physical Exam Constitutional:      General: She is not in acute distress.    Appearance: She is well-developed.  HENT:     Head: Normocephalic and atraumatic.     Mouth/Throat:   Eyes:     Conjunctiva/sclera: Conjunctivae normal.     Pupils: Pupils are equal, round, and reactive to light.  Neck:     Musculoskeletal: Normal range of motion.  Cardiovascular:     Rate and Rhythm: Normal rate.  Pulmonary:     Effort: Pulmonary effort is normal. No respiratory distress.  Abdominal:     General: There is no distension.     Palpations: Abdomen is soft.  Musculoskeletal: Normal range of motion.  Skin:    General: Skin is warm and dry.  Neurological:     Mental Status: She is alert.      UC Treatments / Results  Labs (all labs ordered are listed, but only abnormal results are displayed) Labs Reviewed - No data to  display  EKG   Radiology No results found.  Procedures Procedures (including critical care time)  Medications Ordered in UC Medications - No data to display  Initial Impression / Assessment and Plan / UC Course  I have reviewed the triage vital signs and the nursing notes.  Pertinent labs & imaging results that were available during my care of the patient were reviewed by me and considered in my medical decision making (see chart for details).      Final Clinical Impressions(s) / UC Diagnoses   Final diagnoses:  Pain, dental  Dental infection     Discharge Instructions     Take the antibiotic 3 times a day I have given you a 10-day supply +1 refill in case it is needed Take 2 doses before you go to bed tonight Take ibuprofen 3 times a day with food.  This is for pain and inflammation.  You may use either this or your Naprosyn If the pain is severe you may take hydrocodone.  Do not drive on hydrocodone.  Caution drowsiness Call and get an appointment with a dentist as soon as you are able   ED Prescriptions    Medication Sig Dispense Auth. Provider   cetirizine (  ZYRTEC) 10 MG tablet Take 1 tablet (10 mg total) by mouth daily. 30 tablet Raylene Everts, MD   clindamycin (CLEOCIN) 150 MG capsule Take 1 capsule (150 mg total) by mouth 3 (three) times daily. 30 capsule Raylene Everts, MD   ibuprofen (ADVIL) 800 MG tablet Take 1 tablet (800 mg total) by mouth 3 (three) times daily. 30 tablet Raylene Everts, MD   HYDROcodone-acetaminophen (NORCO/VICODIN) 5-325 MG tablet Take 1-2 tablets by mouth every 6 (six) hours as needed. 10 tablet Raylene Everts, MD     Controlled Substance Prescriptions [place] Controlled Substance Registry consulted? Yes, I have consulted the [place] Controlled Substances Registry for this patient, and feel the risk/benefit ratio today is favorable for proceeding with this prescription for a controlled substance.   Raylene Everts,  MD 12/01/18 [phone]

## 2018-12-03 ENCOUNTER — Telehealth (HOSPITAL_COMMUNITY): Payer: Self-pay | Admitting: Emergency Medicine

## 2018-12-03 MED ORDER — FLUCONAZOLE 150 MG PO TABS: 150.0000 mg | ORAL_TABLET | Freq: Once | ORAL | 0 refills | Status: AC

## 2018-12-03 NOTE — Telephone Encounter (Signed)
Pt returned call, states clindamycin gives her a yeast infection. Sending medication to preferred pharmacy.

## 2018-12-03 NOTE — Telephone Encounter (Signed)
Pt called asking for medication for a yeast infection, attempted to reach her, no answer.

## 2018-12-04 NOTE — Progress Notes (Addendum)
Cardiology Office Note:   Date:  12/05/2018  NAME:  Bailey Hooper    MRN: [id number] DOB:  Jan 22, 1966   PCP:  Mack Hook, MD  Cardiologist:  No primary care provider on file.  Electrophysiologist:  None   Referring MD: Mack Hook, MD   Chief Complaint  Patient presents with  . Heart Murmur   History of Present Illness:   Bailey Hooper is a 53 y.o. female with a hx of hypertension, bipolar disorder, sleep apnea who is being seen today for the evaluation of cardiac murmur/shortness of breath at the request of Mack Hook, MD. she presents for evaluation of a cardiac murmur heard by her primary care physician, as well as shortness of breath.  She reports for the past 6 to 8 months she has had worsening shortness of breath with exertion.  She reports my strenuous thing she is doing is taking care of her 33-year-old granddaughter.  She reports at times exertion can be a little more difficult for her than usual this is not normal.  The symptoms can occur 3-4 times per week.  She does have a diagnosis of asthma and uses her albuterol inhaler intermittently.  She is a current everyday smoker, reporting 1 pack can last 1 week.  She also reports she has a diagnosis of sleep apnea and uses her sleep machine infrequently.  Associated symptoms include fatigue during the day.  She also reports intermittent dizziness, hot flashes, palpitations.  The symptoms appear to be short-lived.  She also reports numbness in her arm.  Of note, she carries a diagnosis of depression anxiety.  She has a history of hypertension her blood pressure is well controlled on amlodipine today.  Labs reveal she is not diabetic most recent A1c is 5.7.  She describes no symptoms of orthopnea, PND.  She has no lower extremity edema on exam or reported previously.  Past Medical History: Past Medical History:  Diagnosis Date  . Anxiety   . Asthma   . Depression   . GERD (gastroesophageal reflux disease)   . High  cholesterol   . Hypertension   . IBS (irritable bowel syndrome)   . Migraines   . Sleep apnea   . Tobacco use   . Vertigo     Past Surgical History: Past Surgical History:  Procedure Laterality Date  . CESAREAN SECTION    . FOOT SURGERY     Left foot    Current Medications: Current Meds  Medication Sig  . amLODipine (NORVASC) 10 MG tablet Take 1 tablet (10 mg total) by mouth daily.  Marland Kitchen azelastine (OPTIVAR) 0.05 % ophthalmic solution Place 1 drop into both eyes 2 (two) times daily. (Patient taking differently: Place 1 drop into both eyes daily as needed. )  . cetirizine (ZYRTEC) 10 MG tablet Take 1 tablet (10 mg total) by mouth daily.  . chlorthalidone (HYGROTON) 25 MG tablet 1/2 tab by mouth every morning  . clindamycin (CLEOCIN) 150 MG capsule Take 1 capsule (150 mg total) by mouth 3 (three) times daily.  . clonazePAM (KLONOPIN) 1 MG tablet 1/2 tab by mouth as needed every 12 hours.  . Dentifrices ([place] DT) Place onto teeth as needed.  . dicyclomine (BENTYL) 20 MG tablet 1/2 to 1 tab 3 times daily before meals and at bedtime  . etonogestrel (IMPLANON) 68 MG IMPL implant 1 each by Subdermal route once.   . fluconazole (DIFLUCAN) 150 MG tablet 1 tab by mouth daily for 3 days.  Marland Kitchen  fluocinonide cream (LIDEX) AB-123456789 % Apply 1 application topically 2 (two) times daily.  Marland Kitchen HYDROcodone-acetaminophen (NORCO/VICODIN) 5-325 MG tablet Take 1-2 tablets by mouth every 6 (six) hours as needed.  Marland Kitchen ibuprofen (ADVIL) 800 MG tablet Take 1 tablet (800 mg total) by mouth 3 (three) times daily.  Marland Kitchen ipratropium (ATROVENT) 0.03 % nasal spray Place 2 sprays into both nostrils every 12 (twelve) hours.  Marland Kitchen ketoconazole (NIZORAL) 2 % cream Apply 1 application topically daily.  . Melatonin 1 MG CAPS Take by mouth.  . mirtazapine (REMERON) 30 MG tablet 1 tab by mouth daily at bedtime  . montelukast (SINGULAIR) 10 MG tablet Take 1 tablet (10 mg total) by mouth at bedtime.  . Multiple  Minerals-Vitamins (CAL MAG ZINC +D3) TABS Take 1 tablet by mouth daily.  . Multiple Vitamin (MULTIVITAMIN) capsule Take 1 capsule by mouth daily.  Marland Kitchen omeprazole (PRILOSEC) 40 MG capsule 1 cap by mouth on empty stomach daily  . SUMAtriptan (IMITREX) 25 MG tablet 1 tab by mouth as needed for headache may repeat in 2 hours if headache persists max 100 mg/24 h     Allergies:    Lisinopril, Lamictal [lamotrigine], Penicillins, and Erythromycin   Social History: Social History   Socioeconomic History  . Marital status: Divorced    Spouse name: Not on file  . Number of children: 1  . Years of education: 100  . Highest education level: High school graduate  Occupational History  . Not on file  Social Needs  . Financial resource strain: Very hard  . Food insecurity    Worry: Often true    Inability: Often true  . Transportation needs    Medical: No    Non-medical: No  Tobacco Use  . Smoking status: Current Every Day Smoker    Packs/day: 0.25    Years: 20.00    Pack years: 5.00    Types: Cigarettes    Start date: 04/09/1986  . Smokeless tobacco: Never Used  . Tobacco comment: Previous 0.5 PPD. trying to quitt. Does not smoke when sick  Substance and Sexual Activity  . Alcohol use: Yes    Alcohol/week: 0.0 standard drinks  . Drug use: No  . Sexual activity: Yes    Birth control/protection: Implant  Lifestyle  . Physical activity    Days per week: Not on file    Minutes per session: Not on file  . Stress: Very much  Relationships  . Social Herbalist on phone: Three times a week    Gets together: Three times a week    Attends religious service: More than 4 times per year    Active member of club or organization: No    Attends meetings of clubs or organizations: Never    Relationship status: Divorced  Other Topics Concern  . Not on file  Social History Narrative   Lives with: Alone (grandaughters visit throughout the week, ages 47 and 53 y/o)   Works: Unemployed in  spring 2020 due to COVID-19   Married: divorced        Family History: The patient's family history includes Alcohol abuse in an other family member; Cancer in an other family member; Colon cancer in her father and another family member; Depression in an other family member; Diabetes in her maternal grandmother and another family member; Drug abuse in an other family member; Heart disease in her father, maternal grandmother, and another family member; Hypertension in an other family member; Osteoporosis in an  other family member. There is no history of Esophageal cancer.  ROS:   All other ROS reviewed and negative. Pertinent positives noted in the HPI.     EKGs/Labs/Other Studies Reviewed:   The following studies were personally reviewed by me today: A1c 5.7, LDL 110, creatinine 0.67  EKG:  EKG is ordered today.  The ekg ordered today demonstrates normal sinus rhythm, heart rate 80, left atrial normality, poor R wave progression noted, no definitive prior infarct, no changes suggestive of acute ischemia, and was personally reviewed by me.   Recent Labs: 07/11/2018: BUN 8; Creatinine, Ser 0.67; Potassium 4.2; Sodium 140   Recent Lipid Panel    Component Value Date/Time   CHOL 173 07/11/2018 1001   TRIG 89 07/11/2018 1001   HDL 45 07/11/2018 1001   CHOLHDL 3.8 07/11/2018 1001   CHOLHDL 3.3 06/11/2016 1637   VLDL 15 06/11/2016 1637   LDLCALC 110 (H) 07/11/2018 1001    Physical Exam:   VS:  BP 130/78   Pulse 80   Temp 98.8 F (37.1 C) (Temporal)   Ht 5\' 6"  (1.676 m)   Wt 186 lb 9.6 oz (84.6 kg)   SpO2 99%   BMI 30.12 kg/m    Wt Readings from Last 3 Encounters:  12/05/18 186 lb 9.6 oz (84.6 kg)  12/01/18 183 lb (83 kg)  11/19/18 182 lb (82.6 kg)    General: Well nourished, well developed, in no acute distress Heart: Atraumatic, normal size  Eyes: PEERLA, EOMI  Neck: Supple, no JVD Endocrine: No thryomegaly Cardiac: Normal S1, S2; faint 2 out of 6 systolic ejection  murmur Lungs: Clear to auscultation bilaterally, no wheezing, rhonchi or rales  Abd: Soft, nontender, no hepatomegaly  Ext: No edema, pulses 2+ Musculoskeletal: No deformities, BUE and BLE strength normal and equal Skin: Warm and dry, no rashes   Neuro: Alert and oriented to person, place, time, and situation, CNII-XII grossly intact, no focal deficits  Psych: Normal mood and affect   ASSESSMENT:   NAME@ is a 53 y.o. female who presents for the following: 1. Murmur   2. SOB (shortness of breath) on exertion   3. Essential hypertension   4. Tobacco abuse   5. Obstructive sleep apnea syndrome     PLAN:   1. Murmur 2. SOB (shortness of breath) on exertion -She reports worsening shortness of breath for the past 6 to 8 months that have been associated with inactivity due to coronavirus pandemic.  She is not short of breath all the time, and has no evidence of clinical heart failure on exam.  She also denies symptoms concerning for clinical heart failure. -Given her heart murmur, which is a faint systolic ejection murmur and shortness of breath, I think it is beneficial to proceed with an echocardiogram to ensure she has no structural heart disease. -I have encouraged her to continue to exercise, and to quit smoking -She also reports symptoms of hot flashes associated with dizziness and I have encouraged her to see her GYN as I suspect she is going to menopause given her age -I will see her on an as-needed basis unless her echocardiogram is abnormal  3. Essential hypertension -Well-controlled today we will continue amlodipine  4. Tobacco abuse -Counseled on the importance of smoking cessation and she will attempt to do this  5. Sleep apnea -We will refer her to sleep medicine today for titration of her CPAP -She reports she was diagnosed 6 or so years ago and uses frequently,  she was encouraged to continue to use her CPAP   Disposition: Return if symptoms worsen or fail to  improve.  Medication Adjustments/Labs and Tests Ordered: Current medicines are reviewed at length with the patient today.  Concerns regarding medicines are outlined above.  Orders Placed This Encounter  Procedures  . EKG 12-Lead   No orders of the defined types were placed in this encounter.   Patient Instructions  Medication Instructions:   NO CHNAGES    Lab work:     NOT NEEDED  Testing/Procedures: WILL BE SCHEDULE AT [facility] Your physician has requested that you have an echocardiogram. Echocardiography is a painless test that uses sound waves to create images of your heart. It provides your doctor with information about the size and shape of your heart and how well your heart's chambers and valves are working. This procedure takes approximately one hour. There are no restrictions for this procedure.    Follow-Up: At [facility], you and your health needs are our priority.  As part of our continuing mission to provide you with exceptional heart care, we have created designated Provider Care Teams.  These Care Teams include your primary Cardiologist (physician) and Advanced Practice Providers (APPs -  Physician Assistants and Nurse Practitioners) who all work together to provide you with the care you need, when you need it. . Dr Audie Box recommends that you schedule a follow-up appointment on an as needed basis if test is normal   Your physician recommends that you schedule a follow-up appointment in Feb 06, 2019 AT 2 PM WITH DR [place] CPAP MACHINE AND ISSUES     Any Other Special Instructions Will Be Listed Below  PLEASE USE YOUR CPAP UNTIL YOU SEE DR Brinckerhoff 2020    Signed, [place] T. Audie Box, [place]  [address], [place], [place] [address] [phone]  12/05/2018 2:29 PM

## 2018-12-05 ENCOUNTER — Encounter: Payer: Self-pay | Admitting: Cardiovascular Disease

## 2018-12-05 ENCOUNTER — Ambulatory Visit (INDEPENDENT_AMBULATORY_CARE_PROVIDER_SITE_OTHER): Payer: Self-pay | Admitting: Cardiovascular Disease

## 2018-12-05 ENCOUNTER — Other Ambulatory Visit: Payer: Self-pay

## 2018-12-05 ENCOUNTER — Ambulatory Visit: Payer: Self-pay | Admitting: Cardiovascular Disease

## 2018-12-05 VITALS — BP 130/78 | HR 80 | Temp 98.8°F | Ht 66.0 in | Wt 186.6 lb

## 2018-12-05 DIAGNOSIS — R0602 Shortness of breath: Secondary | ICD-10-CM

## 2018-12-05 DIAGNOSIS — I10 Essential (primary) hypertension: Secondary | ICD-10-CM

## 2018-12-05 DIAGNOSIS — R011 Cardiac murmur, unspecified: Secondary | ICD-10-CM

## 2018-12-05 DIAGNOSIS — G4733 Obstructive sleep apnea (adult) (pediatric): Secondary | ICD-10-CM

## 2018-12-05 DIAGNOSIS — Z720 Tobacco use: Secondary | ICD-10-CM

## 2018-12-05 NOTE — Patient Instructions (Addendum)
Medication Instructions:   NO CHNAGES    Lab work:     NOT NEEDED  Testing/Procedures: WILL BE SCHEDULE AT [facility] Your physician has requested that you have an echocardiogram. Echocardiography is a painless test that uses sound waves to create images of your heart. It provides your doctor with information about the size and shape of your heart and how well your heart's chambers and valves are working. This procedure takes approximately one hour. There are no restrictions for this procedure.    Follow-Up: At [facility], you and your health needs are our priority.  As part of our continuing mission to provide you with exceptional heart care, we have created designated Provider Care Teams.  These Care Teams include your primary Cardiologist (physician) and Advanced Practice Providers (APPs -  Physician Assistants and Nurse Practitioners) who all work together to provide you with the care you need, when you need it. . Dr Audie Box recommends that you schedule a follow-up appointment on an as needed basis if test is normal   Your physician recommends that you schedule a follow-up appointment in Feb 06, 2019 AT 2 PM WITH DR Marcello Moores KELLY- DISCUSS CPAP MACHINE AND ISSUES     Any Other Special Instructions Will Be Listed Below  PLEASE USE YOUR CPAP UNTIL YOU SEE DR [place] 2020

## 2018-12-09 ENCOUNTER — Ambulatory Visit (HOSPITAL_COMMUNITY): Payer: No Typology Code available for payment source | Attending: Cardiovascular Disease

## 2018-12-09 ENCOUNTER — Other Ambulatory Visit (HOSPITAL_COMMUNITY): Payer: Self-pay | Admitting: Cardiovascular Disease

## 2018-12-09 ENCOUNTER — Other Ambulatory Visit: Payer: Self-pay

## 2018-12-09 DIAGNOSIS — R011 Cardiac murmur, unspecified: Secondary | ICD-10-CM

## 2019-01-18 DIAGNOSIS — J45909 Unspecified asthma, uncomplicated: Secondary | ICD-10-CM | POA: Insufficient documentation

## 2019-01-18 DIAGNOSIS — F319 Bipolar disorder, unspecified: Secondary | ICD-10-CM | POA: Insufficient documentation

## 2019-01-18 DIAGNOSIS — J3089 Other allergic rhinitis: Secondary | ICD-10-CM | POA: Insufficient documentation

## 2019-01-18 DIAGNOSIS — R42 Dizziness and giddiness: Secondary | ICD-10-CM | POA: Insufficient documentation

## 2019-01-19 ENCOUNTER — Telehealth: Payer: Self-pay | Admitting: Internal Medicine

## 2019-01-19 NOTE — Telephone Encounter (Signed)
Called patient to confirm appt. On 01/21/2019 F/U. Patient stated took chlorthalidone (HYGROTON) 25 MG tablet just for a week due to making her sick.   To Bailey Hooper to verify if any change has to be made on her f/u appt.

## 2019-01-20 NOTE — Telephone Encounter (Signed)
Spoke with Dr. Amil Amen regarding this. Patient needs to keep appointment.

## 2019-01-21 ENCOUNTER — Ambulatory Visit: Payer: Self-pay | Admitting: Internal Medicine

## 2019-01-21 ENCOUNTER — Other Ambulatory Visit: Payer: Self-pay

## 2019-01-21 ENCOUNTER — Encounter: Payer: Self-pay | Admitting: Internal Medicine

## 2019-01-21 VITALS — BP 148/92 | HR 88 | Resp 12 | Ht 65.25 in | Wt 187.0 lb

## 2019-01-21 DIAGNOSIS — H8111 Benign paroxysmal vertigo, right ear: Secondary | ICD-10-CM

## 2019-01-21 DIAGNOSIS — K047 Periapical abscess without sinus: Secondary | ICD-10-CM

## 2019-01-21 DIAGNOSIS — G4733 Obstructive sleep apnea (adult) (pediatric): Secondary | ICD-10-CM

## 2019-01-21 DIAGNOSIS — J3089 Other allergic rhinitis: Secondary | ICD-10-CM

## 2019-01-21 DIAGNOSIS — I10 Essential (primary) hypertension: Secondary | ICD-10-CM

## 2019-01-21 DIAGNOSIS — Z1231 Encounter for screening mammogram for malignant neoplasm of breast: Secondary | ICD-10-CM

## 2019-01-21 DIAGNOSIS — R011 Cardiac murmur, unspecified: Secondary | ICD-10-CM

## 2019-01-21 MED ORDER — MOMETASONE FUROATE 50 MCG/ACT NA SUSP: NASAL | 12 refills | Status: DC

## 2019-01-21 MED ORDER — CLONIDINE HCL 0.1 MG PO TABS: ORAL_TABLET | ORAL | 6 refills | Status: DC

## 2019-01-21 NOTE — Progress Notes (Signed)
Subjective:    Patient ID: Bailey Hooper, female   DOB: [birth year], 53 y.o.   MRN: [id number]   HPI   1.  OSA:  Has old CPAP equipment.  Cardiology sent her to sleep specialist.    2.  Heart murmur:  Echo normal with Cardiology  3.  Dental abscess:  Was finally able to get into dental clinic after her urgent care visit--a different tooth.  Tooth extraction last Thursday and now on Clindamycin.    4.  Hypertension:  Faint with sweating and light headedness.when taking Chlorthalidone.  States her bp was about the same and the side effects resolved after she stopped.  Only had side effects when she went outside in the sun.   Has been on Clonidine before, but cannot remember why it was stopped. Has been on Losartan, Lisinopril as well.  5.  At discharge:  Mild vertigo symptoms.  Not clear if when looking/turning her head a certain position.  Has had sneezing, congestion associated with mold/mildew issues in her current living space.  Current Meds  Medication Sig  . amLODipine (NORVASC) 10 MG tablet Take 1 tablet (10 mg total) by mouth daily.  Marland Kitchen azelastine (OPTIVAR) 0.05 % ophthalmic solution Place 1 drop into both eyes 2 (two) times daily. (Patient taking differently: Place 1 drop into both eyes daily as needed. )  . BLACK CURRANT SEED OIL PO Take by mouth. 1 daily  . cetirizine (ZYRTEC) 10 MG tablet Take 1 tablet (10 mg total) by mouth daily.  . clindamycin (CLEOCIN) 150 MG capsule Take 1 capsule (150 mg total) by mouth 3 (three) times daily.  . clonazePAM (KLONOPIN) 1 MG tablet 1/2 tab by mouth as needed every 12 hours.  . Dentifrices ([place] DT) Place onto teeth as needed.  . dicyclomine (BENTYL) 20 MG tablet 1/2 to 1 tab 3 times daily before meals and at bedtime  . etonogestrel (IMPLANON) 68 MG IMPL implant 1 each by Subdermal route once.   . fluocinonide cream (LIDEX) AB-123456789 % Apply 1 application topically 2 (two) times daily.  Marland Kitchen ibuprofen (ADVIL) 800 MG tablet Take 1  tablet (800 mg total) by mouth 3 (three) times daily.  Marland Kitchen ipratropium (ATROVENT) 0.03 % nasal spray Place 2 sprays into both nostrils every 12 (twelve) hours.  Marland Kitchen ketoconazole (NIZORAL) 2 % cream Apply 1 application topically daily.  . Melatonin 1 MG CAPS Take by mouth.  . mirtazapine (REMERON) 30 MG tablet 1 tab by mouth daily at bedtime  . montelukast (SINGULAIR) 10 MG tablet Take 1 tablet (10 mg total) by mouth at bedtime.  . Multiple Minerals-Vitamins (CAL MAG ZINC +D3) TABS Take 1 tablet by mouth daily.  . Multiple Vitamin (MULTIVITAMIN) capsule Take 1 capsule by mouth daily.  Marland Kitchen omeprazole (PRILOSEC) 40 MG capsule 1 cap by mouth on empty stomach daily  . SUMAtriptan (IMITREX) 25 MG tablet 1 tab by mouth as needed for headache may repeat in 2 hours if headache persists max 100 mg/24 h   Allergies  Allergen Reactions  . Lisinopril Palpitations and Cough    Per pt report  . Lamictal [Lamotrigine] Hives  . Penicillins Rash    Vesicles, denies SOB or breathing issues  . Erythromycin Nausea And Vomiting     Review of Systems    Objective:   BP (!) 148/92 (BP Location: Left Arm, Patient Position: Sitting, Cuff Size: Normal)   Pulse 88   Resp 12   Ht 5' 5.25" (1.657  m)   Wt 187 lb (84.8 kg)   BMI 30.88 kg/m   Physical Exam  NAD HEENT:  PERRL, EOMI, Nasal mucosa boggy with clear discharge.  TMs pearly grey, mild cobbling of posterior pharynx.  Neck:  Supple, No adenopathy Chest:  CTA CV:  RRR SEM unchanged.  Radial and DP pulses normal and equal LE:  No edema Neuro:  Mild symptoms without nystagmus with sitting up from [facility] maneuver with head turned to right.   Assessment & Plan  1.  Hypertension: Add Clonidine 0.1 mg, 1/2 tab twice daily. Stop Chlorthalidone.  2. Environmental allergies:  Nasonex Getting out of apt--she will give me address to report to [facility] --not changing filters and with moisture issues--[place] [address] [place] [employer].  3.  HM:   Mammogram Flu vaccine tomorrow.  Refuses all immunizations today--needs pneumococcal.  She really does not want to discuss reasons why  4.  Dental abscess:  Finally addressed with dental  5.  OSA:  Cardiology sending to Sleep medicine.  6.  Mild right BPV:  Follow for now.  Will see if Christella Noa maneuver today helps.

## 2019-02-06 ENCOUNTER — Ambulatory Visit: Payer: Self-pay | Admitting: Cardiovascular Disease

## 2019-02-11 ENCOUNTER — Telehealth: Payer: Self-pay | Admitting: Internal Medicine

## 2019-02-11 ENCOUNTER — Telehealth: Payer: Self-pay | Admitting: Cardiovascular Disease

## 2019-02-11 NOTE — Telephone Encounter (Signed)
See previous note. Will call patient to verify medication list

## 2019-02-11 NOTE — Telephone Encounter (Signed)
Crystal at [facility] called requesting a call back from [place] to get clarification on Patient's inhaler medication.  Please advise.

## 2019-02-11 NOTE — Telephone Encounter (Signed)
New Message  Patient is calling in to get appointment updated from a "no-show" to cancelled. Patient states that she called in prior to her appointment and cancelled due to her not having her orange card and appointment was never cancelled so it is showing as a no-show. Please assist.

## 2019-02-11 NOTE — Telephone Encounter (Signed)
Patient called and canceled 10/30 office visit secondary to not having her orange card. Appointment did not get canceled and she was told to just keep calling about her card. Patient did not realize appointment did not get canceled. She now has a no show visit and she does not like being on her chart because that is not the way she is. Will reach out to [place] to see if this can be removed  Patient will call back once she receives [facility] card to get appointment rescheduled.

## 2019-02-12 NOTE — Telephone Encounter (Signed)
Left message Bailey Hooper at [facility]. Ton hold for now until we get a better understanding of medication history from patient. Spoke with patient states Dr. Amil Amen gave her the information to go the MAP program to get albuterol inhaler. States they dicussed this first visit. Albuterol is listed in meds but flovent is not. Per Dr. Amil Amen need to know who prescribed it and how often she uses it. Patient states she needs a different nasal spray as well. Per Dr. Amil Amen the medication she is requesting is to stop the drip not to help reduce the swelling.

## 2019-02-18 NOTE — Telephone Encounter (Signed)
Have tried to contact patient regarding inhalers with no response. Patient scheduled for New patient appointment on 02/23/2019 at [facility]

## 2019-02-23 ENCOUNTER — Encounter: Payer: Self-pay | Admitting: Family Medicine

## 2019-02-23 ENCOUNTER — Ambulatory Visit (INDEPENDENT_AMBULATORY_CARE_PROVIDER_SITE_OTHER): Payer: Self-pay | Admitting: Family Medicine

## 2019-02-23 ENCOUNTER — Other Ambulatory Visit (HOSPITAL_COMMUNITY)
Admission: RE | Admit: 2019-02-23 | Discharge: 2019-02-23 | Disposition: A | Discharge status: Home / Self Care | Payer: Self-pay | Source: Ambulatory Visit | Attending: Family Medicine | Admitting: Family Medicine

## 2019-02-23 ENCOUNTER — Other Ambulatory Visit: Payer: Self-pay

## 2019-02-23 VITALS — BP 142/82 | HR 105 | Wt 189.0 lb

## 2019-02-23 DIAGNOSIS — E785 Hyperlipidemia, unspecified: Secondary | ICD-10-CM

## 2019-02-23 DIAGNOSIS — Z113 Encounter for screening for infections with a predominantly sexual mode of transmission: Secondary | ICD-10-CM

## 2019-02-23 DIAGNOSIS — K219 Gastro-esophageal reflux disease without esophagitis: Secondary | ICD-10-CM

## 2019-02-23 DIAGNOSIS — R1011 Right upper quadrant pain: Secondary | ICD-10-CM

## 2019-02-23 DIAGNOSIS — I10 Essential (primary) hypertension: Secondary | ICD-10-CM

## 2019-02-23 DIAGNOSIS — N898 Other specified noninflammatory disorders of vagina: Secondary | ICD-10-CM

## 2019-02-23 DIAGNOSIS — F319 Bipolar disorder, unspecified: Secondary | ICD-10-CM

## 2019-02-23 LAB — POCT WET PREP (WET MOUNT)
Clue Cells Wet Prep Whiff POC: NEGATIVE
Trichomonas Wet Prep HPF POC: ABSENT

## 2019-02-23 MED ORDER — TRAZODONE HCL 50 MG PO TABS: 25.0000 mg | ORAL_TABLET | Freq: Every evening | ORAL | 0 refills | Status: DC | PRN

## 2019-02-23 MED ORDER — ALBUTEROL SULFATE HFA 108 (90 BASE) MCG/ACT IN AERS: 2.0000 | INHALATION_SPRAY | Freq: Four times a day (QID) | RESPIRATORY_TRACT | 2 refills | Status: DC | PRN

## 2019-02-23 MED ORDER — DEXLANSOPRAZOLE 30 MG PO CPDR: 30.0000 mg | DELAYED_RELEASE_CAPSULE | Freq: Every day | ORAL | 3 refills | Status: DC

## 2019-02-23 NOTE — Assessment & Plan Note (Signed)
Previous EGD in 2018.  Patient reports a variety symptoms today.  Differential includes symptomatic hiatal hernia or cholelithiasis.  Given symptoms recommend right upper quadrant ultrasound.  Is currently unaffordable for patient will discuss with Kennyth Lose alternative options.

## 2019-02-23 NOTE — Assessment & Plan Note (Signed)
Encouraged her to reestablish care with psychiatry.  She is having a lot of difficulty with sleep.  She currently uses mirtazapine for sleep.  She takes is too late she has excessive daytime sleepiness that she takes it too early she then has sleepiness while her grandchildren are still present we discussed this at length and agreed that she would trial a small dose of trazodone as compared to Remeron.  Side effects were reviewed including serotonin syndrome.

## 2019-02-23 NOTE — Patient Instructions (Addendum)
STOP Clonidine  DO NOT MEASURE YOUR BLOOD PRESSURE UNTIL Friday  Call me with your blood pressure values  I will check in with you in 2 weeks   It was wonderful to see you today.  Thank you for choosing [facility].   Please call [phone] with any questions about today's appointment.  Please be sure to schedule follow up at the front  desk before you leave today.   Dorris Singh, MD  Family Medicine   I sent dexilant to your pharmacy Stop prilosec Start dexilant  If this does not work, try OTC Gaviscon   Call if your stomach symptoms/reflux worsen   Your ultrasound has been scheduled---let me know if this is an issue

## 2019-02-23 NOTE — Progress Notes (Signed)
  Patient Name: Bailey Hooper Date of Birth: May 28, 1965 Date of Visit: 02/23/19 PCP: Mack Hook, MD  Chief Complaint: discuss concerns   Subjective: Bailey Hooper is a pleasant 53 y.o. with medical history significant for obesity, hypertension, GERD, vertigo, tobacco use and bipolar presenting today for follow up and several concerns.  Stomach Issues The patient has had ongoing IBS symptoms she reports worsening of her GERD.  She reports dyspepsia, intermittent globus sensation without dysphagia and excessive gas.  She denies vomiting, nausea, constipation she denies melena or hematochezia she had a colonoscopy and EGD in 2018.  She thinks her Prilosec is stopped working.  She does not drink alcohol she continues to smoke.  Sleep  The patient has a history of bipolar 1 disorder.  She is currently on mirtazapine for sleep.  She reports some side effects from this would like to try an alternative medication.  She denies alcohol use.  She has moderate sleep hygiene.  Vaginal Discharge The patient recently had unprotected sexual intercourse and would like STI testing today.  She reports a little bit of vaginal odor and excess discharge.  Denies fevers or pelvic pain.  Hypertension  The patient is currently on clonidine, which was previously prescribed for hot flashes not hypertension and amlodipine.  She monitors her blood pressure at home no chest pain or dyspnea.   ROS: Per HPI.   I have reviewed the patient's medical, surgical, family, and social history as appropriate.  Vitals:   02/23/19 0958  BP: (!) 142/82  Pulse: (!) 105  SpO2: 99%   GU Exam:  Chaperoned exam.  External exam: Normal-appearing female external genitalia.  Vaginal exam notable for moderate amount of white discharge no odor.  Cervix is normal. HEENT: Sclera anicteric. Dentition is poor with left partial absent.Marland Kitchen Appears well hydrated. Neck: Supple Cardiac: Regular rate and rhythm. Normal S1/S2. No murmurs, rubs,  or gallops appreciated. Lungs: Clear bilaterally to ascultation.  Abdomen: Hyperactive bowel sounds. No tenderness to deep or light palpation. No rebound or guarding.  Extremities: Warm, well perfused without edema.  Skin: Warm, dry Psych: Pleasant and appropriate    Essential hypertension, benign Discussed at length.  Recommended discontinuing clonidine.  We discussed monitoring her blood pressure and calling me in about 2 weeks to check in regarding this.  Consider ARB or a thiazide like diuretic in the future.  GERD (gastroesophageal reflux disease) Previous EGD in 2018.  Patient reports a variety symptoms today.  Differential includes symptomatic hiatal hernia or cholelithiasis.  Given symptoms recommend right upper quadrant ultrasound.  Is currently unaffordable for patient will discuss with Kennyth Lose alternative options. CBC and CMET.   Bipolar disorder [facility]) Encouraged her to reestablish care with psychiatry.  She is having a lot of difficulty with sleep.  She currently uses mirtazapine for sleep.  She takes is too late she has excessive daytime sleepiness that she takes it too early she then has sleepiness while her grandchildren are still present we discussed this at length and agreed that she would trial a small dose of trazodone as compared to Remeron.  Side effects were reviewed including serotonin syndrome.  Vaginal discharge, wet prep negative, STI testing ordered.   Return to care in 1 month for virtual visit.   Dorris Singh, MD  Family Medicine Teaching Service

## 2019-02-23 NOTE — Assessment & Plan Note (Signed)
Discussed at length.  Recommended discontinuing clonidine.  We discussed monitoring her blood pressure and calling me in about 2 weeks to check in regarding this.  Consider ARB or a thiazide like diuretic in the future.

## 2019-02-24 ENCOUNTER — Encounter: Payer: Self-pay | Admitting: Family Medicine

## 2019-02-24 ENCOUNTER — Other Ambulatory Visit (HOSPITAL_COMMUNITY): Payer: Self-pay | Admitting: *Deleted

## 2019-02-24 ENCOUNTER — Telehealth: Payer: Self-pay | Admitting: Family Medicine

## 2019-02-24 DIAGNOSIS — Z1231 Encounter for screening mammogram for malignant neoplasm of breast: Secondary | ICD-10-CM

## 2019-02-24 LAB — LIPID PANEL
Chol/HDL Ratio: 4.2 ratio (ref 0.0–4.4)
Cholesterol, Total: 159 mg/dL (ref 100–199)
HDL: 38 mg/dL — ABNORMAL LOW (ref >39)
LDL Chol Calc (NIH): 105 mg/dL — ABNORMAL HIGH (ref 0–99)
Triglycerides: 85 mg/dL (ref 0–149)
VLDL Cholesterol Cal: 16 mg/dL (ref 5–40)

## 2019-02-24 LAB — COMPREHENSIVE METABOLIC PANEL
ALT: 15 IU/L (ref 0–32)
AST: 15 IU/L (ref 0–40)
Albumin/Globulin Ratio: 1.6 (ref 1.2–2.2)
Albumin: 4.4 g/dL (ref 3.8–4.9)
Alkaline Phosphatase: 99 IU/L (ref 39–117)
BUN/Creatinine Ratio: 13 (ref 9–23)
BUN: 10 mg/dL (ref 6–24)
Bilirubin Total: <0.2 mg/dL (ref 0.0–1.2)
CO2: 22 mmol/L (ref 20–29)
Calcium: 8.8 mg/dL (ref 8.7–10.2)
Chloride: 106 mmol/L (ref 96–106)
Creatinine, Ser: 0.8 mg/dL (ref 0.57–1.00)
GFR calc Af Amer: 97 mL/min/{1.73_m2} (ref >59)
GFR calc non Af Amer: 84 mL/min/{1.73_m2} (ref >59)
Globulin, Total: 2.8 g/dL (ref 1.5–4.5)
Glucose: 105 mg/dL — ABNORMAL HIGH (ref 65–99)
Potassium: 3.7 mmol/L (ref 3.5–5.2)
Sodium: 141 mmol/L (ref 134–144)
Total Protein: 7.2 g/dL (ref 6.0–8.5)

## 2019-02-24 LAB — RPR: RPR Ser Ql: NONREACTIVE

## 2019-02-24 LAB — CBC
Hematocrit: 40.4 % (ref 34.0–46.6)
Hemoglobin: 12.8 g/dL (ref 11.1–15.9)
MCH: 26.8 pg (ref 26.6–33.0)
MCHC: 31.7 g/dL (ref 31.5–35.7)
MCV: 85 fL (ref 79–97)
Platelets: 353 10*3/uL (ref 150–450)
RBC: 4.78 x10E6/uL (ref 3.77–5.28)
RDW: 13.5 % (ref 11.7–15.4)
WBC: 7 10*3/uL (ref 3.4–10.8)

## 2019-02-24 LAB — CERVICOVAGINAL ANCILLARY ONLY
Chlamydia: NEGATIVE
Comment: NEGATIVE
Comment: NORMAL
Neisseria Gonorrhea: NEGATIVE

## 2019-02-24 LAB — HCV COMMENT:

## 2019-02-24 LAB — HEPATITIS C ANTIBODY (REFLEX): HCV Ab: <0.1 s/co ratio (ref 0.0–0.9)

## 2019-02-24 LAB — HIV ANTIBODY (ROUTINE TESTING W REFLEX): HIV Screen 4th Generation wRfx: NONREACTIVE

## 2019-02-24 NOTE — Telephone Encounter (Signed)
Called patient with results. Discussed need for statin given ASCVD of 14%. Patient wishes to try to quit smoking, repeat in spring, if still smoking, needs statin.  Dorris Singh, MD  Family Medicine Teaching Service

## 2019-02-26 ENCOUNTER — Other Ambulatory Visit: Payer: Self-pay

## 2019-02-26 NOTE — Telephone Encounter (Signed)
Reviewed form for Medicaid and placed in PCP's box for completion.  Bailey Hooper, [place]

## 2019-02-26 NOTE — Telephone Encounter (Signed)
Medicaid appeal  form dropped off for at front desk for completion.  Verified that patient section of form has been completed.  Last DOS/WCC with PCP was 02/23/19 Placed form in team folder to be completed by clinical staff.  Bailey Hooper

## 2019-02-26 NOTE — Telephone Encounter (Signed)
Reviewed form--this is a record request that patient needs to sign. Please let patient know she needs to complete her section (bottom). [facility] has specific record release as well.   Dorris Singh, MD  Family Medicine Teaching Service

## 2019-02-27 NOTE — Telephone Encounter (Signed)
Called patient on 02/16/2019 and informed her that she needed to sign form as well as a release of Information for her records to be sent to [facility].  Patient stated that she would be coming by to sign both documents.  Ozella Almond, [place]

## 2019-03-10 MED ORDER — SUMATRIPTAN SUCCINATE 50 MG PO TABS: ORAL_TABLET | ORAL | 11 refills | Status: DC

## 2019-03-10 NOTE — Addendum Note (Signed)
Addended by: Marcelino Duster on: 03/10/2019 11:50 AM   Modules accepted: Orders

## 2019-03-16 ENCOUNTER — Encounter: Payer: Self-pay | Admitting: Family Medicine

## 2019-03-16 ENCOUNTER — Ambulatory Visit (INDEPENDENT_AMBULATORY_CARE_PROVIDER_SITE_OTHER): Payer: Self-pay | Admitting: Family Medicine

## 2019-03-16 ENCOUNTER — Other Ambulatory Visit: Payer: Self-pay

## 2019-03-16 ENCOUNTER — Other Ambulatory Visit (HOSPITAL_COMMUNITY)
Admission: RE | Admit: 2019-03-16 | Discharge: 2019-03-16 | Disposition: A | Discharge status: Home / Self Care | Payer: Self-pay | Source: Ambulatory Visit | Attending: Family Medicine | Admitting: Family Medicine

## 2019-03-16 VITALS — BP 138/82 | HR 104 | Wt 183.0 lb

## 2019-03-16 DIAGNOSIS — F4322 Adjustment disorder with anxiety: Secondary | ICD-10-CM

## 2019-03-16 DIAGNOSIS — N898 Other specified noninflammatory disorders of vagina: Secondary | ICD-10-CM | POA: Insufficient documentation

## 2019-03-16 DIAGNOSIS — B3731 Acute candidiasis of vulva and vagina: Secondary | ICD-10-CM

## 2019-03-16 DIAGNOSIS — I10 Essential (primary) hypertension: Secondary | ICD-10-CM

## 2019-03-16 DIAGNOSIS — F331 Major depressive disorder, recurrent, moderate: Secondary | ICD-10-CM

## 2019-03-16 DIAGNOSIS — B373 Candidiasis of vulva and vagina: Secondary | ICD-10-CM

## 2019-03-16 DIAGNOSIS — F319 Bipolar disorder, unspecified: Secondary | ICD-10-CM

## 2019-03-16 LAB — POCT WET PREP (WET MOUNT)
Clue Cells Wet Prep Whiff POC: NEGATIVE
Trichomonas Wet Prep HPF POC: ABSENT

## 2019-03-16 MED ORDER — FLUCONAZOLE 150 MG PO TABS: 150.0000 mg | ORAL_TABLET | ORAL | 0 refills | Status: DC

## 2019-03-16 NOTE — Patient Instructions (Addendum)
It was great to see you today.   Please call [facility] [place]   Follow up in 2 weeks  Please schedule an appointment in Dermatology clinic with Dr. Gwendlyn Deutscher or Dr. Erin Hearing   Try Dicyclomine for your stomach issues---take 1 once per day     Outpatient Mental Health Providers (No Insurance at time of Visit or Self Pay)   [facility] Mon-Fri, 8am-3pm [address] [place], [place]  [email or web address]  [facility] [place] ([place]abla [place]spanol) walk in M-F 8am-12pm and  1pm-3pm [place]  [place]  Phone: [phone]  [facility] ([place] and substance challenges) [address], [place] [phone]    [email or web address] of the [place], [place]     Phone:  [phone] [place]  [phone]         [facility] Walk-in MWF 12:30 to 3:00     [place] [address]  [phone]  [email or web address] call to schedule an appointment    [place] ,Support group, Peer support services, [address], [place], [place] [address] [phone]  [email or web address]           National Alliance on Mental Illness (NAMI) [place]- Wellness classes, Support groups        [address] [place], [place] [address] [phone]   CurrentJokes.cz  [facility]  (Psycho-social Rehabilitation clubhouse, Individual and group therapy) [address], [place] [address]   [phone]  24- Hour Availability:  *[place] or [phone] * [facility] (Domestic Violence, Rape, etc. )[phone] [place] Sessions [phone] or [phone] * [facility] [place] [phone] [phone] (after hours) *Therapeutic  Alternative Mobile Crisis Unit [phone] *[place] National Suicide Hotline 628-665-6019 Diamantina Monks)  Psychiatry Resource List (Adults and Children) Most of these providers will take Medicaid. please consult your insurance for a complete and updated list of available providers. When calling to make an appointment have your insurance information available to confirm you are covered.  [place]:   [place]: [address]     [phone] Linna Hoff: [place],        [phone] [facility]: [address] [place],    [place]: [address] [place],                   [place]  (Psychiatry & counseling ; adults & children ; will take Medicaid) [place], [place], [place]        [phone]   [place]  (Psychiatry only; Adults only, will take Medicaid)  [place], [place], [place] [address]       [phone]   [place] (Psychiatry & counseling ; adults & children ; will take Medicaid [place] [address]  [place] [address]   [phone]    ([place] therapist)  [facility]  Psychiatry & counseling; Adults and children;  [address]    [place],  [address]    [phone]  [facility] (Psychiatry & counseling; adults &  children; Medicare no Medicaid)  [place], [place]  [address]      [phone]    [facility] (up to age 64)  Psychiatry & counseling ,will take Medicaid, must do counseling to receive psychiatry services  [address] [place] [address]        ([place] (Psychiatry & counseling; adults & children; will take Medicaid) [address],  [place], [place]  [phone]  [facility]---  Walk-in Mon-Fri, 8:30-5:00 (will take Medicaid)  [address] [place], [place]  [phone]     [facility] --- Walk-In Mon-Friday 8am-3pm ( will take Medicaid, Psychiatry, Adults & children,  [address] [place], [place]   [phone]   [facility] [place]--, Walk-in M-F 8am-12pm and 1pm -3pm   (Counseling, Psychiatry, will take Medicaid, adults & children)  [address] [place], [place]  [phone]     It was wonderful to see you today.  Thank you for choosing [facility].   Please call [phone] with any questions about today's appointment.  Please be sure to schedule follow up at the front  desk before you leave today.   Dorris Singh, MD  Family Medicine

## 2019-03-16 NOTE — Assessment & Plan Note (Signed)
Blood pressure goal today.  We will continue current medications.
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
## 2019-03-16 NOTE — Assessment & Plan Note (Addendum)
Patient continues to struggle with depression and anxiety.  Denies thoughts of hurting herself or others.  Provided information and resources to reach out to.  Patient has a number of worries and barriers to accessing mental health care including lack of insurance.  Will discuss with staff. Encouraged patient to reach out to 3 specific therapist, she identifies barriers to each of them. Previously seen by [facility] and [facility], does not wish to return. Engaged in shared decision making about next steps and who to call.

## 2019-03-17 LAB — CERVICOVAGINAL ANCILLARY ONLY
Chlamydia: NEGATIVE
Comment: NEGATIVE
Comment: NORMAL
Neisseria Gonorrhea: NEGATIVE

## 2019-03-18 ENCOUNTER — Encounter: Payer: Self-pay | Admitting: Family Medicine

## 2019-03-24 ENCOUNTER — Encounter: Payer: Self-pay | Admitting: Family Medicine

## 2019-03-30 ENCOUNTER — Telehealth: Payer: Self-pay | Admitting: Family Medicine

## 2019-04-02 ENCOUNTER — Telehealth: Payer: Self-pay | Admitting: Family Medicine

## 2019-04-02 DIAGNOSIS — B373 Candidiasis of vulva and vagina: Secondary | ICD-10-CM

## 2019-04-02 DIAGNOSIS — B3731 Acute candidiasis of vulva and vagina: Secondary | ICD-10-CM

## 2019-04-02 MED ORDER — FLUCONAZOLE 150 MG PO TABS: 150.0000 mg | ORAL_TABLET | Freq: Once | ORAL | 0 refills | Status: AC

## 2019-04-02 NOTE — Telephone Encounter (Signed)
Patient calls after hours emergency line complaining of vaginal discharge with vaginal itching.  She denies dysuria/abdominal pain/fever/rash/new skin lesions, she says that she has been on a recent course of antibiotics for dental issues and this is a known issue whenever she is on antibiotics.  She says this is completely consistent with prior diagnosed episodes of yeast vaginitis.  We discussed symptoms that could indicate there is something going on beyond a yeast infection for which she should appear in front of a doctor for an exam and further testing and patient understands.  We discussed that she can use over-the-counter Lotrimin for external fungal infections if some start as well as a one-time dose of Diflucan which should work for yeast vaginitis.  Dr. Criss Rosales

## 2019-04-07 ENCOUNTER — Telehealth: Payer: Self-pay | Admitting: Family Medicine

## 2019-04-07 NOTE — Telephone Encounter (Signed)
Attempted to call patient regarding MyChart message.   Unable to leave voicemail.  Dorris Singh, MD  Family Medicine Teaching Service

## 2019-04-17 ENCOUNTER — Telehealth: Payer: Self-pay

## 2019-04-17 DIAGNOSIS — N951 Menopausal and female climacteric states: Secondary | ICD-10-CM

## 2019-04-17 MED ORDER — GABAPENTIN 300 MG PO CAPS: 300.0000 mg | ORAL_CAPSULE | Freq: Every day | ORAL | 0 refills | Status: DC

## 2019-04-17 NOTE — Telephone Encounter (Signed)
Patient calls nurse line to discuss hot flashes. Patient endorses day sweats and night sweats, the night sweats are the worst. Patient stated she has to get up in the middle of the night to change sheets and shower. Patient has been using black cohosh with no relief. Patient does have an apt for January 18th, however would like to be started on something now, and then discuss at apt. Please advise.

## 2019-04-17 NOTE — Telephone Encounter (Signed)
Please schedule for virtual visit with me or another provider to discuss.   Thank you, Dorris Singh, MD  [facility] Medicine Teaching Service

## 2019-04-17 NOTE — Telephone Encounter (Signed)
No appointments available. Called patient to check in--- she reports ongoing hot flashes. Occur at night. Has tried OTC, fans, turning down heat, appropriate clothing. Discussed other interventions, increase water, reduce caffeine.   Rx for gabapentin at low dose. Will not use SSRI or SNRI given strong history of bipolar.   Dorris Singh, MD  Family Medicine Teaching Service

## 2019-04-24 ENCOUNTER — Encounter: Payer: Self-pay | Admitting: Internal Medicine

## 2019-04-27 ENCOUNTER — Telehealth (INDEPENDENT_AMBULATORY_CARE_PROVIDER_SITE_OTHER): Payer: Self-pay | Admitting: Family Medicine

## 2019-04-27 ENCOUNTER — Other Ambulatory Visit: Payer: Self-pay

## 2019-04-27 ENCOUNTER — Encounter: Payer: Self-pay | Admitting: Family Medicine

## 2019-04-27 DIAGNOSIS — R232 Flushing: Secondary | ICD-10-CM | POA: Insufficient documentation

## 2019-04-27 DIAGNOSIS — F319 Bipolar disorder, unspecified: Secondary | ICD-10-CM

## 2019-04-27 DIAGNOSIS — N951 Menopausal and female climacteric states: Secondary | ICD-10-CM

## 2019-04-27 NOTE — Progress Notes (Signed)
[facility] Telemedicine Visit  Patient consented to have virtual visit. Method of visit: Telephone  Encounter participants: Patient: Bailey Hooper - located at home Provider: Martyn Malay - located at [facility] Others (if applicable): none   Chief Complaint: cost concerns   HPI:  Bailey Hooper is a 54 year old with history of mood disorder presenting for follow up from hot flashes.   The patient reports she has tried to few days of gabapentin.  She reports this is cost prohibitive.  She reports it is minimally helping at this point  The patient reports  number of stressors: - [place] > $900 - Unstable housing--her daughter may be moving, which makes affording her apartment difficult  - Income concerns- interviewed at 2 jobs but did not receive call back. Has not received unemployment in 2 months. She is struggling to keep up with bill she reports.  May be moving [place] with live with aunt's daughter.   She reports her anxiety is really been flaring she reports her sleep is poor.  She denies thoughts of hurting herself or others. She has multiple ongoing stressors. She is thinking about applying for disability again.   ROS: per HPI  Pertinent PMHx:  Bipolar disorder Anxiety   Exam:  Respiratory: Speaking in full quick sentences   Assessment/Plan:  Bipolar disorder ([place]) Has reached out to a counselor at [employer].  Discussed at length at length.  She currently has multiple stresses ongoing.  Recommended she reach out to [facility] or other agency.  Reviewed how to get to behavioral health should she feels she is in crisis or have feelings of hurting herself or others.  We will discuss options for other psychiatry providers with Neoma Laming and Kennyth Lose.  I have reached out to [place] about her financially letter to see if she is receiving assistance so that she can get in with psychiatry.Marland Kitchen  Hot flashes Discussed, symptoms minimally improving with  gabapentin.  Continue at current dose for now.    Time spent during visit with patient: 20 minutes  Dorris Singh, MD  [facility] Medicine Teaching Service

## 2019-04-27 NOTE — Assessment & Plan Note (Signed)
Discussed, symptoms minimally improving with gabapentin.  Continue at current dose for now.

## 2019-04-27 NOTE — Assessment & Plan Note (Addendum)
Has reached out to a counselor at [employer].  Discussed at length at length.  She currently has multiple stresses ongoing.  Recommended she reach out to [facility] or other agency.  Reviewed how to get to behavioral health should she feels she is in crisis or have feelings of hurting herself or others.  We will discuss options for other psychiatry providers with Neoma Laming and Kennyth Lose.  I have reached out to [facility] about her financially letter to see if she is receiving assistance so that she can get in with psychiatry.Marland Kitchen

## 2019-04-28 ENCOUNTER — Ambulatory Visit
Admission: RE | Admit: 2019-04-28 | Discharge: 2019-04-28 | Disposition: A | Discharge status: Home / Self Care | Payer: No Typology Code available for payment source | Source: Ambulatory Visit | Attending: Obstetrics and Gynecology | Admitting: Obstetrics and Gynecology

## 2019-04-28 ENCOUNTER — Encounter: Payer: Self-pay | Admitting: Family Medicine

## 2019-04-28 ENCOUNTER — Ambulatory Visit (HOSPITAL_COMMUNITY)
Admission: RE | Admit: 2019-04-28 | Discharge: 2019-04-28 | Disposition: A | Discharge status: Home / Self Care | Payer: Self-pay | Source: Ambulatory Visit | Attending: Obstetrics and Gynecology | Admitting: Obstetrics and Gynecology

## 2019-04-28 ENCOUNTER — Ambulatory Visit: Payer: Self-pay | Admitting: Licensed Clinical Social Worker

## 2019-04-28 ENCOUNTER — Encounter (HOSPITAL_COMMUNITY): Payer: Self-pay

## 2019-04-28 ENCOUNTER — Other Ambulatory Visit: Payer: Self-pay

## 2019-04-28 DIAGNOSIS — Z1239 Encounter for other screening for malignant neoplasm of breast: Secondary | ICD-10-CM

## 2019-04-28 DIAGNOSIS — Z1231 Encounter for screening mammogram for malignant neoplasm of breast: Secondary | ICD-10-CM

## 2019-04-28 HISTORY — DX: Benign neoplasm of connective and other soft tissue, unspecified: D21.9

## 2019-04-28 NOTE — Patient Instructions (Signed)
Explained breast self awareness with [employer]. Patient did not need a Pap smear today due to last Pap smear and HPV typing was 07/01/2017. Let her know BCCCP will cover Pap smears and HPV typing every 5 years unless has a history of abnormal Pap smears. Referred patient to the [facility] for a screening mammogram. Appointment scheduled for Tuesday, April 28, 2019 at 1330. Patient aware of appointment and will be there. Let patient know the [facility] will follow up with her within the next couple weeks with results of her mammogram by letter or phone. Discussed smoking cessation with patient. Referred to the [facility] Quitline. Bailey Hooper verbalized understanding.  Bailey Hooper, Arvil Ch[place], RN 12:44 PM

## 2019-04-28 NOTE — Progress Notes (Signed)
No complaints today.   Pap Smear: Pap smear not completed today. Last Pap smear was 07/01/2017 at [facility] and normal with negative HPV. Per patient has no history of an abnormal Pap smear. Last Pap smear result is in Epic.  Physical exam: Breasts Breasts symmetrical. No skin abnormalities bilateral breasts. No nipple retraction bilateral breasts. No nipple discharge bilateral breasts. No lymphadenopathy. No lumps palpated bilateral breasts. Complaints of mild left breast tenderness on exam. Referred patient to the [facility] for a screening mammogram. Appointment scheduled for Tuesday, April 28, 2019 at 1330.        Pelvic/Bimanual No Pap smear completed today since last Pap smear and HPV typing was 07/01/2017. Pap smear not indicated per BCCCP guidelines.   Smoking History: Patient is a current smoker. Discussed smoking cessation with patient. Referred to the [facility] Quitline.  Patient Navigation: Patient education provided. Access to services provided for patient through [facility] program.   Colorectal Cancer Screening: Per patient had a colonoscopy completed in 2019. No complaints today.   Breast and Cervical Cancer Risk Assessment: Patient has a family history of a paternal aunt having breast cancer. Patient has no known genetic mutations or history of radiation treatment to the chest before age 42. Patient has no history of cervical dysplasia, immunocompromised, or DES exposure in-utero.  Risk Assessment    Risk Scores      04/28/2019   Last edited by: Demetrius Revel, LPN   5-year risk: 1.3 %   Lifetime risk: 8.2 %

## 2019-04-28 NOTE — Chronic Care Management (AMB) (Signed)
   Social Work  Care Management Consultation  04/28/2019 Name: Bailey Hooper MRN: [id number] DOB: July 23, 1965  Bailey Hooper is a 54 y.o. year old female who sees Martyn Malay, MD for primary care. LCSW was consulted by PCP for information /resources to assistance patient with resources. Patient was not seen or contacted during this encounter however LCSW reviewed chart, notes, insurance and collaboration with PCP .     Recommendation: After consultation with provider it is determined that patient may benefit from connecting with [facility] to meet her housing and mental health needs.   Intervention: Relevant information and resources discussed and shared with provider. Provider will give information to patient   Plan: The care management team is available to follow up with the patient after provider's conversation and subsequent referral to the care management team if needed.  No further follow up required by LCSW at this time  Casimer Lanius, [place] / [place]   [phone] 11:16 AM

## 2019-06-06 ENCOUNTER — Ambulatory Visit (INDEPENDENT_AMBULATORY_CARE_PROVIDER_SITE_OTHER): Payer: No Typology Code available for payment source | Admitting: Psychiatry

## 2019-06-06 ENCOUNTER — Encounter (HOSPITAL_COMMUNITY): Payer: Self-pay | Admitting: Psychiatry

## 2019-06-06 ENCOUNTER — Other Ambulatory Visit: Payer: Self-pay

## 2019-06-06 DIAGNOSIS — F3132 Bipolar disorder, current episode depressed, moderate: Secondary | ICD-10-CM

## 2019-06-06 DIAGNOSIS — F419 Anxiety disorder, unspecified: Secondary | ICD-10-CM

## 2019-06-06 DIAGNOSIS — F4310 Post-traumatic stress disorder, unspecified: Secondary | ICD-10-CM

## 2019-06-06 MED ORDER — HYDROXYZINE HCL 10 MG PO TABS: ORAL_TABLET | ORAL | 0 refills | Status: DC

## 2019-06-06 MED ORDER — ARIPIPRAZOLE 5 MG PO TABS: ORAL_TABLET | ORAL | 1 refills | Status: DC
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
## 2019-06-10 ENCOUNTER — Telehealth (HOSPITAL_COMMUNITY): Payer: Self-pay | Admitting: *Deleted

## 2019-06-10 DIAGNOSIS — F419 Anxiety disorder, unspecified: Secondary | ICD-10-CM

## 2019-06-10 NOTE — Telephone Encounter (Signed)
Pt called stating that [employer] will not cover Vistaril 10mg , only 25 or 50mg . Pt says she's willing to cut tabs in half. Pt also states that the Abilify is not being covered at all. Writer is going to provide samples of the Abilify for pt. Will pick up tomorrow. Please review.

## 2019-06-11 MED ORDER — HYDROXYZINE HCL 25 MG PO TABS: ORAL_TABLET | ORAL | 0 refills | Status: DC

## 2019-06-11 NOTE — Telephone Encounter (Signed)
Direction and dosage changed and send to pharmacy.

## 2019-06-16 ENCOUNTER — Ambulatory Visit (HOSPITAL_COMMUNITY): Payer: Self-pay | Admitting: Psychiatry

## 2019-06-24 ENCOUNTER — Other Ambulatory Visit: Payer: Self-pay

## 2019-06-24 ENCOUNTER — Ambulatory Visit (INDEPENDENT_AMBULATORY_CARE_PROVIDER_SITE_OTHER): Payer: Self-pay | Admitting: Licensed Clinical Social Worker

## 2019-06-24 ENCOUNTER — Telehealth (HOSPITAL_COMMUNITY): Payer: Self-pay | Admitting: Psychiatry

## 2019-06-24 ENCOUNTER — Encounter (HOSPITAL_COMMUNITY): Payer: Self-pay | Admitting: Licensed Clinical Social Worker

## 2019-06-24 DIAGNOSIS — F419 Anxiety disorder, unspecified: Secondary | ICD-10-CM

## 2019-06-24 DIAGNOSIS — F3132 Bipolar disorder, current episode depressed, moderate: Secondary | ICD-10-CM

## 2019-06-24 DIAGNOSIS — F4310 Post-traumatic stress disorder, unspecified: Secondary | ICD-10-CM

## 2019-06-24 NOTE — Telephone Encounter (Signed)
D:  Pt's therapist [facility] Noah Delaine, [place]) referred pt to [facility].  A:  Placed call to orient patient and provide her with a start date, but there was no answer.  Left vm requesting that pt call case manager back.  Inform [employer], LCAS.
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
## 2019-06-26 ENCOUNTER — Other Ambulatory Visit: Payer: Self-pay | Admitting: *Deleted

## 2019-06-26 ENCOUNTER — Telehealth (HOSPITAL_COMMUNITY): Payer: Self-pay | Admitting: Psychiatry

## 2019-06-26 MED ORDER — ALBUTEROL SULFATE HFA 108 (90 BASE) MCG/ACT IN AERS: 2.0000 | INHALATION_SPRAY | Freq: Four times a day (QID) | RESPIRATORY_TRACT | 2 refills | Status: DC | PRN

## 2019-06-26 NOTE — Telephone Encounter (Signed)
D:  Pt is scheduled to start [place] on 06-29-19.  According to pt, she has the orange card coverage.  A:  Placed call to [facility] [phone]) to confirm coverage of MH-IOP.  Left vm.  Awaiting call back.

## 2019-06-29 ENCOUNTER — Ambulatory Visit (HOSPITAL_COMMUNITY): Payer: No Typology Code available for payment source | Admitting: Psychiatry

## 2019-06-29 ENCOUNTER — Other Ambulatory Visit: Payer: Self-pay

## 2019-06-30 ENCOUNTER — Other Ambulatory Visit (HOSPITAL_COMMUNITY): Payer: Self-pay | Admitting: *Deleted

## 2019-06-30 DIAGNOSIS — F419 Anxiety disorder, unspecified: Secondary | ICD-10-CM

## 2019-06-30 MED ORDER — HYDROXYZINE HCL 25 MG PO TABS: ORAL_TABLET | ORAL | 0 refills | Status: DC

## 2019-12-18 ENCOUNTER — Other Ambulatory Visit: Payer: Self-pay

## 2020-01-08 ENCOUNTER — Ambulatory Visit (INDEPENDENT_AMBULATORY_CARE_PROVIDER_SITE_OTHER): Payer: 59 | Admitting: Family Medicine

## 2020-01-08 ENCOUNTER — Encounter: Payer: Self-pay | Admitting: Family Medicine

## 2020-01-08 ENCOUNTER — Other Ambulatory Visit: Payer: Self-pay

## 2020-01-08 VITALS — BP 140/86 | HR 63 | Wt 186.2 lb

## 2020-01-08 DIAGNOSIS — R7301 Impaired fasting glucose: Secondary | ICD-10-CM | POA: Diagnosis not present

## 2020-01-08 DIAGNOSIS — Z9989 Dependence on other enabling machines and devices: Secondary | ICD-10-CM

## 2020-01-08 DIAGNOSIS — G4733 Obstructive sleep apnea (adult) (pediatric): Secondary | ICD-10-CM

## 2020-01-08 DIAGNOSIS — F331 Major depressive disorder, recurrent, moderate: Secondary | ICD-10-CM

## 2020-01-08 DIAGNOSIS — I10 Essential (primary) hypertension: Secondary | ICD-10-CM

## 2020-01-08 DIAGNOSIS — R0982 Postnasal drip: Secondary | ICD-10-CM | POA: Diagnosis not present

## 2020-01-08 DIAGNOSIS — R200 Anesthesia of skin: Secondary | ICD-10-CM | POA: Diagnosis not present

## 2020-01-08 DIAGNOSIS — J329 Chronic sinusitis, unspecified: Secondary | ICD-10-CM

## 2020-01-08 NOTE — Patient Instructions (Addendum)
It was wonderful to see you today.  Please bring ALL of your medications with you to every visit.   Today we talked about:  --Getting blood work for your tingling--I will call you  -- I will order a sleep study--they will call you  -- You will be called by ENT   Thank you for choosing [facility].   Please call [phone] with any questions about today's appointment.  Please be sure to schedule follow up at the front  desk before you leave today.   Dorris Singh, MD  Family Medicine

## 2020-01-08 NOTE — Assessment & Plan Note (Signed)
Patient has tried multiple agents in the past. Discussed that atypical antipsychotics are best monitored by Psychiatry. Previously seen by Psychiatry--will continue to discuss. PHQ reviewed. PHQ 18.

## 2020-01-08 NOTE — Progress Notes (Signed)
° ° °  SUBJECTIVE:   CHIEF COMPLAINT / HPI:   Bailey Hooper is a pleasant 54 year old with history of HTN, mood disorder, GERD, and IBS presenting to re-establish care. Since our last visit, she has had several changes. Daughter is doing better. She has [place]   Her primary concern today is nasal congestion and drainage. This has been ongoing for years, has a history of dental disease as well as sinus infections. She has chronic nasal drainage unrelieved by antihistamines and intranasal steroids.No headaches, ear pain. She has had recent dental work with partial.   In terms of HTN, patient was started on atenolol. No headaches, chest pain, dyspnea.   Patient reports non adherence to CPAP. Her CPAP titration was last 10 years ago, has had dental work and weight changes.   The patient reports intermittent bilateral LE tingling. Has chronic back pain with sciatica but she thinks this is different. No weakness or falls. Denies polyuria or polydipsia.   PERTINENT  PMH / PSH/Family/Social History : HTN, OSA, Tobacco use, obese   OBJECTIVE:   BP 140/86    Pulse 63    Wt 186 lb 3.2 oz (84.5 kg)    SpO2 99%    BMI 30.75 kg/m   HEENT: Sclera anicteric. Dentition is moderate. Appears well hydrated. Neck: Supple Cardiac: Regular rate and rhythm. Normal S1/S2. No murmurs, rubs, or gallops appreciated. Lungs: Clear bilaterally to ascultation.  Extremities: Warm, well perfused without edema.  Skin: Warm, dry Psych: Pleasant and appropriate     ASSESSMENT/PLAN:   Major depressive disorder, recurrent episode, moderate ([place]) Patient has tried multiple agents in the past. Discussed that atypical antipsychotics are best monitored by Psychiatry. Previously seen by Psychiatry--will continue to discuss. PHQ reviewed. PHQ 18.   Essential hypertension, benign At goal, discussed atenolol not preferred agent given harms. Will discontinue in future, likely switch to Coreg. BMP  today for monitoring.    Chronic sinusitis, discussed at length in terms of nasal steroid and antihistamine. Has had multiple prior ABX. Referred to ENT.  LE Numbness, good pulses in DP bilaterally, differential includes diabetes (less likely) b12 deficiency, unlikely sciatica given bilateral.   OSA, requiring titration.  HCM Declined COVID vaccine, discussed at [place], MD  [place]

## 2020-01-08 NOTE — Assessment & Plan Note (Signed)
At goal, discussed atenolol not preferred agent given harms. Will discontinue in future, likely switch to Coreg. BMP today for monitoring.

## 2020-01-09 LAB — VITAMIN B12: Vitamin B-12: 539 pg/mL (ref 232–1245)

## 2020-01-09 LAB — BASIC METABOLIC PANEL
BUN/Creatinine Ratio: 13 (ref 9–23)
BUN: 9 mg/dL (ref 6–24)
CO2: 23 mmol/L (ref 20–29)
Calcium: 9.3 mg/dL (ref 8.7–10.2)
Chloride: 103 mmol/L (ref 96–106)
Creatinine, Ser: 0.68 mg/dL (ref 0.57–1.00)
GFR calc Af Amer: 115 mL/min/{1.73_m2} (ref >59)
GFR calc non Af Amer: 100 mL/min/{1.73_m2} (ref >59)
Glucose: 113 mg/dL — ABNORMAL HIGH (ref 65–99)
Potassium: 4.1 mmol/L (ref 3.5–5.2)
Sodium: 141 mmol/L (ref 134–144)

## 2020-01-09 LAB — HEMOGLOBIN A1C
Est. average glucose Bld gHb Est-mCnc: 131 mg/dL
Hgb A1c MFr Bld: 6.2 % — ABNORMAL HIGH (ref 4.8–5.6)

## 2020-01-11 ENCOUNTER — Telehealth: Payer: Self-pay | Admitting: Family Medicine

## 2020-01-11 NOTE — Telephone Encounter (Signed)
Left generic voicemail for patient call back regarding results. If she calls back  - Kidneys look great - Her marker for diabetes is in the 'prediabetes' range--no need for medications, repeat in 1 year - Her b12 is normal   Dorris Singh, MD  [facility] Medicine Teaching Service

## 2020-02-01 ENCOUNTER — Other Ambulatory Visit: Payer: Self-pay

## 2020-02-01 ENCOUNTER — Encounter: Payer: Self-pay | Admitting: Family Medicine

## 2020-02-01 ENCOUNTER — Ambulatory Visit (INDEPENDENT_AMBULATORY_CARE_PROVIDER_SITE_OTHER): Payer: 59 | Admitting: Family Medicine

## 2020-02-01 ENCOUNTER — Telehealth: Payer: Self-pay | Admitting: Family Medicine

## 2020-02-01 VITALS — BP 145/70 | HR 71 | Ht 66.0 in | Wt 183.4 lb

## 2020-02-01 DIAGNOSIS — R1319 Other dysphagia: Secondary | ICD-10-CM | POA: Diagnosis not present

## 2020-02-01 DIAGNOSIS — G4733 Obstructive sleep apnea (adult) (pediatric): Secondary | ICD-10-CM | POA: Diagnosis not present

## 2020-02-01 DIAGNOSIS — K219 Gastro-esophageal reflux disease without esophagitis: Secondary | ICD-10-CM

## 2020-02-01 DIAGNOSIS — I10 Essential (primary) hypertension: Secondary | ICD-10-CM

## 2020-02-01 DIAGNOSIS — R232 Flushing: Secondary | ICD-10-CM | POA: Diagnosis not present

## 2020-02-01 DIAGNOSIS — F319 Bipolar disorder, unspecified: Secondary | ICD-10-CM

## 2020-02-01 MED ORDER — METOPROLOL SUCCINATE ER 25 MG PO TB24: 25.0000 mg | ORAL_TABLET | Freq: Every day | ORAL | 3 refills | Status: DC

## 2020-02-01 NOTE — Assessment & Plan Note (Signed)
DDx considered- motility disorder, stricture, hiatal hernia. Given new symptoms and age, referral to GI for consideration of EGD.

## 2020-02-01 NOTE — Progress Notes (Signed)
SUBJECTIVE:   CHIEF COMPLAINT / HPI:    Bailey Hooper is a pleasant 54 year old with history significant for hypertension, overweight status, mood disorder on atypical antipsychotic, and postmenopausal syndrome presenting today for routine follow-up.  She has a number of concerns.  The patient reports her biggest concern today is ongoing dyspepsia.  She has had ongoing what she describes as stomach issues for years.  Prior evaluation has included an EGD which demonstrated gastritis in the past.  She reports new over the past few months her symptoms of dysphagia.  She feels that at times solid foods get stuck.  She denies weight loss, constipation, diarrhea, melena or hematochezia.  She is up-to-date on her colonoscopy.  She follows with GI. She reports a history of hiatal hernia as well.   The patient has not yet heard from ear nose and throat about her chronic sinusitis.  The patient has been using the same CPAP for about 10 years without titration.  She reports some ill fitting of the mask and is not sure if it continues to work well.  She has some modest excessive daytime sleepiness but is able to focus.  The patient has ongoing hot flashes.  Her last menstrual period was years ago.  She had a Nexplanon [facility].  She is very pleased with this.  She has had no bleeding.  She reports her hot flashes really come and go with her mood swings.  She has not had a recent TSH checked.  In terms of the patient's prediabetes she is working on making lifestyle changes.  She is somewhat frustrated by her relatively stable weight and the fact that she has difficult time making changes.  She is interested in seeing nutrition to make specific changes related to her weight.  The patient's biggest concern today is stopping her atenolol.  The patient reports that she is tolerating the medication well.  She reports is the only medication is controlled her blood pressure.  She denies  headaches, blurry vision, chest pain, dyspnea.  She is amenable to an alternative. PERTINENT  PMH / PSH/Family/Social History : updated and reviewed.   OBJECTIVE:   BP (!) 145/70   Pulse 71   Ht 5\' 6"  (1.676 m)   Wt 183 lb 6.4 oz (83.2 kg)   SpO2 100%   BMI 29.60 kg/m   Today's weight:  Last Weight  Most recent update: 02/01/2020 10:46 AM   Weight  83.2 kg (183 lb 6.4 oz)           Review of prior weights: Filed Weights   02/01/20 1045  Weight: 183 lb 6.4 oz (83.2 kg)    HEENT: Sclera anicteric. Dentition is moderate. Appears well hydrated. Neck: Supple Cardiac: Regular rate and rhythm. Normal S1/S2. No murmurs, rubs, or gallops appreciated. Lungs: Clear bilaterally to ascultation.  Abdomen: Normoactive bowel sounds. No tenderness to deep or light palpation. No rebound or guarding.  Extremities: Warm, well perfused without edema.  Skin: Warm, dry Psych: Pleasant and appropriate     ASSESSMENT/PLAN:   Bipolar disorder ([place]) Given symptoms and comorbid illnesses recommend referral to psychiatry.  We discussed at length.  She will continue on her current medication.  She is only taking her SSRI and olanzapine combination every other day.Reviewed interaction between olanzapine and metoprolol.  Will start a little lowest dose of metoprolol.  Her heart rate is appropriate on atenolol at a higher dose.  Essential hypertension,  benign Stop atenolol and started metoprolol.  Ideally would be on alternative agent outpatient over the patient strongly prefers beta-blocker feels this gives her good benefit. May also help with migraines, which have reduced since starting beta blocker.  Would add HCTZ or Norvasc at next visit if BP elevated.   GERD (gastroesophageal reflux disease) DDx considered- motility disorder, stricture, hiatal hernia. Given new symptoms and age, referral to GI for consideration of EGD.    Hot flashes, discussed, also discussed nexplanon removal given age.  Wishes to wait.  Skin Tags- multiple per patient, recommend follow up with another appointment vs. Dermatology clinic.   Chronic sinusitis- patient to reach out if she does not hear from Dr. Pollie Friar office.   Prediabetes, encouraged increased activity during daytime, number and card given for Dr. Jenne Campus.   HCM Declined flu and covid--discussed at length.    Bailey Hooper, Circle

## 2020-02-01 NOTE — Assessment & Plan Note (Signed)
Stop atenolol and started metoprolol.  Ideally would be on alternative agent outpatient over the patient strongly prefers beta-blocker feels this gives her good benefit. May also help with migraines, which have reduced since starting beta blocker.  Would add HCTZ or Norvasc at next visit if BP elevated.

## 2020-02-01 NOTE — Telephone Encounter (Signed)
Reviewed, completed, and signed form.  Note routed to RN team inbasket and placed completed form in Clinic RN's office (wall pocket above desk).  Verdelle Valtierra M Kanda Deluna, MD   

## 2020-02-01 NOTE — Assessment & Plan Note (Signed)
Given symptoms and comorbid illnesses recommend referral to psychiatry.  We discussed at length.  She will continue on her current medication.  She is only taking her SSRI and olanzapine combination every other day.Reviewed interaction between olanzapine and metoprolol.  Will start a little lowest dose of metoprolol.  Her heart rate is appropriate on atenolol at a higher dose.

## 2020-02-01 NOTE — Patient Instructions (Addendum)
It was wonderful to see you today.  Please bring ALL of your medications with you to every visit.   Today we talked about:  - Going to the lab for blood work   - Call Dr. Jenne Campus (card given)  - Call a psychiatrist  - please follow up around December (our staff will call you to schedule)   Thank you for choosing [facility].   Please call [phone] with any questions about today's appointment.  Please be sure to schedule follow up at the front  desk before you leave today.   Dorris Singh, MD  Family Medicine     Call Psychiatry number below  Psychiatry Resource List (Adults and Children) Most of these providers will take Medicaid. please consult your insurance for a complete and updated list of available providers. When calling to make an appointment have your insurance information available to confirm you are covered.   [facility]:Psychiatry and Counseling [address] [facility] [address] [place]. [place], [place] [address] [phone]  [facility]  [place], [place]:   [facility]: [address]     [phone]   Linna Hoff: [place]. [place],        [phone] [place]: [place],    [place]: [address],                   [place] [facility]: [place] [facility] [place]         [place]  (Psychiatry only; Adults /children 12 and over, will take Medicaid)  [place], [place], [place] [address]       [phone]   [place] (Psychiatry & counseling ; adults & children ; will take Medicaid [address]  [address]  [place] [address]   Go on-line to complete referral ( [email or web address] [phone]   (Spanish therapist)  [facility]  Psychiatry & counseling; Adults and children;  Call Registration prior to scheduling an appointment [phone] [place]    [place], [place] [address]    [phone]  [facility] (Psychiatry & counseling; adults & children; Medicare no Medicaid)  [address] [place], [place]  [address]      [phone]    [facility] (up to age 36)  Psychiatry & counseling ,will take Medicaid, must do counseling to receive psychiatry services  [address] [place] [address]        ([employer] (Psychiatry & counseling; adults & children; will take Medicaid) Will need a referral from provider [address],  [place], [place]  [phone]   [facility] --- Walk-In Mon-Friday 8am-3pm ( will take Medicaid, Psychiatry, Adults & children,  [address], [place], [place]   [phone]   [facility]--, Walk-in M-F 8am-12pm and 1pm -3pm   (Counseling, Psychiatry, will take Medicaid, adults & children)  [address], [place], [place]  [phone]

## 2020-02-02 ENCOUNTER — Encounter: Payer: Self-pay | Admitting: Family Medicine

## 2020-02-02 ENCOUNTER — Telehealth: Payer: Self-pay | Admitting: Family Medicine

## 2020-02-02 LAB — CBC
Hematocrit: 40.6 % (ref 34.0–46.6)
Hemoglobin: 12.8 g/dL (ref 11.1–15.9)
MCH: 26.1 pg — ABNORMAL LOW (ref 26.6–33.0)
MCHC: 31.5 g/dL (ref 31.5–35.7)
MCV: 83 fL (ref 79–97)
Platelets: 337 10*3/uL (ref 150–450)
RBC: 4.9 x10E6/uL (ref 3.77–5.28)
RDW: 13.8 % (ref 11.7–15.4)
WBC: 6.8 10*3/uL (ref 3.4–10.8)

## 2020-02-02 LAB — TSH: TSH: 1.2 u[IU]/mL (ref 0.450–4.500)

## 2020-02-02 LAB — HEPATIC FUNCTION PANEL
ALT: 13 IU/L (ref 0–32)
AST: 11 IU/L (ref 0–40)
Albumin: 4.3 g/dL (ref 3.8–4.9)
Alkaline Phosphatase: 107 IU/L (ref 44–121)
Bilirubin Total: 0.3 mg/dL (ref 0.0–1.2)
Bilirubin, Direct: 0.1 mg/dL (ref 0.00–0.40)
Total Protein: 7.2 g/dL (ref 6.0–8.5)

## 2020-02-02 NOTE — Telephone Encounter (Signed)
Patient advised of form ready for pick up in the front office.

## 2020-02-02 NOTE — Telephone Encounter (Signed)
Called patient with normal results.   Patient would like to be schedule in Dermatology clinic for skin concerns--Front team can you please schedule in Dermatology clinic once open?  Thank you, Dorris Singh, MD  [facility] Medicine Teaching Service

## 2020-02-18 ENCOUNTER — Telehealth: Payer: Self-pay | Admitting: Neurology

## 2020-02-18 ENCOUNTER — Encounter: Payer: Self-pay | Admitting: Family Medicine

## 2020-02-18 DIAGNOSIS — G4733 Obstructive sleep apnea (adult) (pediatric): Secondary | ICD-10-CM

## 2020-02-18 NOTE — Telephone Encounter (Signed)
Pt called stating last time she seen Dr. Rexene Alberts she felt there was a language barrier. She stated she did not understand what was said at the end of her appt. Pt asked if she could switch to another doctor. I informed the pt I would have to get the switch approved. Also, I informed her both doctors are from [place] therefore, I am unsure if the switch would help her any if she had a hard time understanding Dr. Rexene Alberts. Pt stated she will call her doctor to send her somewhere else.

## 2020-02-19 ENCOUNTER — Ambulatory Visit (INDEPENDENT_AMBULATORY_CARE_PROVIDER_SITE_OTHER): Payer: 59 | Admitting: Otolaryngology

## 2020-02-19 NOTE — Telephone Encounter (Signed)
Call patient regarding multiple MyChart messages.  She reports she is a lot going on at home with her granddaughter and her daughter at school.  She denies thoughts of hurting herself or others.  She reports she is frustrated by her her prior evaluation with [facility] for sleep medicine and requests a different referral.  New referral resent to [facility].

## 2020-02-22 ENCOUNTER — Other Ambulatory Visit: Payer: Self-pay | Admitting: Family Medicine

## 2020-02-22 DIAGNOSIS — R7303 Prediabetes: Secondary | ICD-10-CM

## 2020-02-23 ENCOUNTER — Ambulatory Visit: Payer: Self-pay | Admitting: Neurology

## 2020-02-23 NOTE — Telephone Encounter (Signed)
Pt scheduled and informed. Yared Barefoot, CMA  

## 2020-02-25 ENCOUNTER — Encounter (INDEPENDENT_AMBULATORY_CARE_PROVIDER_SITE_OTHER): Payer: Self-pay | Admitting: Otolaryngology

## 2020-02-25 ENCOUNTER — Other Ambulatory Visit: Payer: Self-pay

## 2020-02-25 ENCOUNTER — Ambulatory Visit (INDEPENDENT_AMBULATORY_CARE_PROVIDER_SITE_OTHER): Payer: 59 | Admitting: Otolaryngology

## 2020-02-25 VITALS — Temp 97.7°F

## 2020-02-25 DIAGNOSIS — K219 Gastro-esophageal reflux disease without esophagitis: Secondary | ICD-10-CM | POA: Diagnosis not present

## 2020-02-25 DIAGNOSIS — J310 Chronic rhinitis: Secondary | ICD-10-CM

## 2020-02-25 NOTE — Progress Notes (Signed)
HPI: Bailey Hooper is a 54 y.o. female who presents is referred by her PCP Dr. Owens Shark for evaluation of chronic complaints of sinus problems and postnasal drainage. She also has history of GERD and takes Dexilant in the mornings. She complains of intermittent nasal obstruction and excessive amount of mucus in the nose. She has used Flonase intermittently. She has also taken Singulair as well as Zyrtec. She had allergy testing several years ago but was not found to have any significant allergies. She notices a lot of postnasal drainage and occasional sore throat. She has also been treated with ipratropium nasal spray because of the complaints of excessive mucus. The mucus is generally clear.. I reviewed a CT scan she had of her head performed in 2014 that showed clear paranasal sinuses with very small frontal sinuses..  Past Medical History:  Diagnosis Date  . Anxiety   . Asthma   . Depression   . Fibroids   . GERD (gastroesophageal reflux disease)   . High cholesterol   . Hypertension   . IBS (irritable bowel syndrome)   . Migraines   . Sleep apnea   . Tobacco use   . Vertigo    Past Surgical History:  Procedure Laterality Date  . CESAREAN SECTION    . FOOT SURGERY     Left foot   Social History   Socioeconomic History  . Marital status: Divorced    Spouse name: Not on file  . Number of children: 1  . Years of education: 57  . Highest education level: High school graduate  Occupational History  . Not on file  Tobacco Use  . Smoking status: Current Every Day Smoker    Packs/day: 0.25    Years: 20.00    Pack years: 5.00    Types: Cigarettes    Start date: 04/09/1986  . Smokeless tobacco: Never Used  . Tobacco comment: Previous 0.5 PPD. trying to quitt. Does not smoke when sick  Vaping Use  . Vaping Use: Never used  Substance and Sexual Activity  . Alcohol use: Yes    Alcohol/week: 0.0 standard drinks    Comment: rarely  . Drug use: No  . Sexual activity: Yes  Other Topics  Concern  . Not on file  Social History Narrative   Lives with: Alone (grandaughters visit throughout the week, ages 50 and 54 y/o)   Works: Unemployed in spring 2020 due to COVID-19   Married: divorced      Social Determinants of Radio broadcast assistant Strain:   . Difficulty of Paying Living Expenses: Not on file  Food Insecurity:   . Worried About Charity fundraiser in the Last Year: Not on file  . Ran Out of Food in the Last Year: Not on file  Transportation Needs: No Transportation Needs  . Lack of Transportation (Medical): No  . Lack of Transportation (Non-Medical): No  Physical Activity:   . Days of Exercise per Week: Not on file  . Minutes of Exercise per Session: Not on file  Stress:   . Feeling of Stress : Not on file  Social Connections:   . Frequency of Communication with Friends and Family: Not on file  . Frequency of Social Gatherings with Friends and Family: Not on file  . Attends Religious Services: Not on file  . Active Member of Clubs or Organizations: Not on file  . Attends Archivist Meetings: Not on file  . Marital Status: Not on file  Family History  Problem Relation Age of Onset  . Colon cancer Other        Father  . Cancer Other        Oral, uncle  . Alcohol abuse Other        Father  . Drug abuse Other        Father  . Osteoporosis Other   . Depression Other   . Hypertension Other   . Diabetes Other   . Heart disease Other        Grandmother  . Colon cancer Father   . Heart disease Father   . Diabetes Father   . Hypertension Father   . Dementia Father   . Heart disease Maternal Grandmother   . Diabetes Maternal Grandmother   . Diabetes Mother   . Depression Mother   . Hypertension Mother   . Esophageal cancer Neg Hx    Allergies  Allergen Reactions  . Lisinopril Palpitations and Cough    Per pt report  . Lamictal [Lamotrigine] Hives  . Penicillins Rash    Vesicles, denies SOB or breathing issues  . Erythromycin  Nausea And Vomiting   Prior to Admission medications   Medication Sig Start Date End Date Taking? Authorizing Provider  albuterol (VENTOLIN HFA) 108 (90 Base) MCG/ACT inhaler Inhale 2 puffs into the lungs every 6 (six) hours as needed for wheezing. 06/26/19  Yes Martyn Malay, MD  amLODipine (NORVASC) 10 MG tablet Take 1 tablet (10 mg total) by mouth daily. 10/15/18  Yes Mack Hook, MD  azelastine (OPTIVAR) 0.05 % ophthalmic solution Place 1 drop into both eyes 2 (two) times daily. Patient taking differently: Place 1 drop into both eyes daily as needed.  10/15/18  Yes Mack Hook, MD  BLACK CURRANT SEED OIL PO Take by mouth. 1 daily   Yes [provider]  cetirizine (ZYRTEC) 10 MG tablet Take 1 tablet (10 mg total) by mouth daily. 12/01/18  Yes Raylene Everts, MD  Dexlansoprazole 30 MG capsule Take 1 capsule (30 mg total) by mouth daily. 02/23/19  Yes Martyn Malay, MD  dicyclomine (BENTYL) 20 MG tablet 1/2 to 1 tab 3 times daily before meals and at bedtime 10/15/18  Yes Mack Hook, MD  etonogestrel (IMPLANON) 68 MG IMPL implant 1 each by Subdermal route once.    Yes [provider]  fluocinonide cream (LIDEX) 1.02 % Apply 1 application topically 2 (two) times daily.   Yes [provider]  gabapentin (NEURONTIN) 300 MG capsule Take 1 capsule (300 mg total) by mouth at bedtime. 04/17/19  Yes Martyn Malay, MD  hydrOXYzine (ATARAX/VISTARIL) 25 MG tablet Take 1/2 to one tab daily for anxiety and sleep 06/30/19  Yes Arfeen, Arlyce Harman, MD  ibuprofen (ADVIL) 800 MG tablet Take 1 tablet (800 mg total) by mouth 3 (three) times daily. 12/01/18  Yes Raylene Everts, MD  ipratropium (ATROVENT) 0.03 % nasal spray Place 2 sprays into both nostrils every 12 (twelve) hours. 10/15/18  Yes Mack Hook, MD  ketoconazole (NIZORAL) 2 % cream Apply 1 application topically daily.    Yes [provider]  Melatonin 1 MG CAPS Take by mouth.   Yes [provider]  metoprolol succinate (TOPROL-XL) 25 MG 24 hr tablet Take 1 tablet (25 mg total) by mouth at bedtime. Take with or immediately following a meal. 02/01/20  Yes Martyn Malay, MD  mirtazapine (REMERON) 30 MG tablet 1 tab by mouth daily at bedtime 10/15/18  Yes Mack Hook, MD  mometasone (NASONEX) 50 MCG/ACT nasal spray 2 sprays each nostril daily 01/21/19  Yes Mack Hook, MD  montelukast (SINGULAIR) 10 MG tablet Take 1 tablet (10 mg total) by mouth at bedtime. 10/15/18  Yes Mack Hook, MD  Multiple Minerals-Vitamins (CAL MAG ZINC +D3) TABS Take 1 tablet by mouth daily.   Yes [provider]  Multiple Vitamin (MULTIVITAMIN) capsule Take 1 capsule by mouth daily.   Yes [provider]  OLANZapine-FLUoxetine (SYMBYAX) 12-25 MG capsule Take 1 capsule by mouth every evening.   Yes [provider]  SUMAtriptan (IMITREX) 50 MG tablet May repeat in 2 hours if headache persists or recurs with max of 100 mg in 24 hours. 03/10/19  Yes Mack Hook, MD  traZODone (DESYREL) 50 MG tablet Take 0.5-1 tablets (25-50 mg total) by mouth at bedtime as needed for sleep. 02/23/19  Yes Martyn Malay, MD  loratadine (CLARITIN) 10 MG tablet Take 1 tablet (10 mg total) by mouth daily. Patient not taking: Reported on 11/19/2018 06/02/18 12/01/18  Martyn Malay, MD     Positive ROS: Otherwise negative  All other systems have been reviewed and were otherwise negative with the exception of those mentioned in the HPI and as above.  Physical Exam: Constitutional: Alert, well-appearing, no acute distress Ears: External ears without lesions or tenderness. Ear canals are clear bilaterally. Tympanic membranes are clear bilaterally. She heard about the same in both ears on tuning fork testing. Nasal: External nose without lesions. Septum is midline with moderate rhinitis. After decongesting the nose with Afrin nasal endoscopy was performed and on nasal  endoscopy the middle meatus regions were clear bilaterally with no polyps and no evidence of mucopurulent discharge. The posterior nasal cavity was clear with no polyps and no mucopurulent discharge. The nasopharynx was clear bilaterally. On the right side the scope was passed down to view the hypopharynx and larynx in this region likewise was clear with no mucosal abnormalities. Oral: Lips and gums without lesions. Tongue and palate mucosa without lesions. Posterior oropharynx clear. Neck: No palpable adenopathy or masses. Respiratory: Breathing comfortably  Skin: No facial/neck lesions or rash noted.  Nasal/sinus endoscopy  Date/Time: 02/25/2020 4:31 PM Performed by: Rozetta Nunnery, MD Authorized by: Rozetta Nunnery, MD   Consent:    Consent obtained:  Verbal   Consent given by:  Patient Procedure details:    Indications: sino-nasal symptoms     Medication:  Afrin   Instrument: flexible fiberoptic nasal endoscope     Scope location: bilateral nare   Septum:    normal   Sinus:    Right middle meatus: normal     Left middle meatus: normal     Right nasopharynx: normal     Left nasopharynx: normal   Throat:    Right hypopharynx: normal     Left hypopharynx: normal     True vocal cords: normal   Comments:     On nasal endoscopy patient had findings consistent with reactive rhinitis. No polyps and no signs of infection. Mucus drainage was clear throughout.    Assessment: Chronic rhinitis. History of GERD.  Plan: Discussed with her concerning regular use of either Flonase or Nasacort 2 sprays each nostril at night as this will help some with nasal congestion. Also reviewed with her concerning use of saline irrigations which should help the most for the chronic postnasal drainage. She should continue with Singulair plus minus Zyrtec as needed. Also reviewed with her that  I think GERD is contributing some to the throat symptoms and would recommend taking the Dexilant  that she is presently taking in the morning and take it before dinner as this will provide better nighttime coverage when she is probably having more reflux. She will follow-up as needed.   Radene Journey, MD   CC:

## 2020-03-07 ENCOUNTER — Other Ambulatory Visit: Payer: Self-pay

## 2020-03-07 ENCOUNTER — Ambulatory Visit (INDEPENDENT_AMBULATORY_CARE_PROVIDER_SITE_OTHER): Payer: 59 | Admitting: Family Medicine

## 2020-03-07 DIAGNOSIS — R7303 Prediabetes: Secondary | ICD-10-CM

## 2020-03-07 NOTE — Progress Notes (Signed)
Telehealth Encounter  (Text link to [phone] ) I connected with Joletta Manner (MRN [id number]) on 03/07/2020 by MyChart video-enabled, HIPAA-compliant telemedicine application, verified that I was speaking with the correct person using two identifiers, and that the patient was in a private environment conducive to confidentiality.  The patient agreed to proceed.  Persons participating in visit are patient and provider (registered dietitian) Kennith Center, PhD, RD, LDN, CEDRD. Provider was located at [place] during this telehealth encounter; patient was at home.  Appt start time: 1100 end time: 1200 (1 hour)  Reason for telehealth visit: Referred by Dorris Singh, MD for Medical Nutrition Therapy related to pre-diabetes.  Relevant history/background: Ms. Paszkiewicz's A1c has been elevated since at least 2015, ranging from 5.9 to 6.2 (01/08/20).  She also has a h/o "GI issues" (IBS?), HTN, HLD, bipolar, and MDD, and she would like to learn healthier eating habits.    Assessment: Ms. Croghan has a varied eating pattern, dependent in large part on IBS symptoms.  Inadequate sleep is possibly contributing to her IBS, and is also likely to make behavior change more difficult.     Usual eating pattern: 2 meals and 2 snacks per day. Frequent foods and beverages: water, soda (sometimes), juice; starchy foods i.e., pasta, rice, potatoes, cereal.   Avoided foods: most dairy (lactose intolerance), pork, high-fat and fried foods (except 1-2 X wk).   Usual physical activity: None currently.   Sleep: Estimates average of 4 hours of sleep/night.  Has sleep apnea and insomnia.  CPAP doesn't seem to help.  Sleep med's don't help her stay asleep, so has discontinued.  Has been waiting to hear from [facility] to schedule a sleep study.   24-hr recall:  (Up at 8 AM; water with medicine) B ( AM)-  --- Snk ( AM)-  --- L (1 PM)-  8 chx wings, 2 tbsp bleu chs dressing, 1 1/2 collards, 6 oz Mtn  Dew Snk (3 PM)-  1 Little Debbie oatmeal cake, water Snk (4 PM)-  1 c banana pudding, water D (7 PM)-  1 short rib, 1 c mac&chs, 1/2 c collards, 6-8 oz Mtn Dew Snk (9:30)-  1 c banana pudding, water Typical day? No.  If appetite is good, eats bkfst, small lunch, and healthy dinner.  Eats sweets about 7-14 X wk.  Appetite is sometimes poor b/c of IBS and stomach spasms.   (Patient was unaware of referral made by Dr. Owens Shark for upper GI endoscopy.)    Intervention: Completed diet and exercise history, and established behavioral goals aimed primarily at managing blood glucose.   For recommendations and goals, see Patient Instructions.    Follow-up: 3 weeks.   Lavada Langsam,JEANNIE

## 2020-03-07 NOTE — Patient Instructions (Addendum)
  Diet Recommendations for Diabetes  Carbohydrate includes starch, sugar, and fiber.  Of these, only sugar and starch raise blood glucose.  (Fiber is found in fruits, vegetables [especially skin, seeds, and stalks], whole grains, and beans.)   Starchy (carb) foods: Bread, rice, pasta, potatoes, corn, cereal, grits, crackers, bagels, muffins, all baked goods.  (Fruit, milk, and yogurt also have carbohydrate, but most of these foods will not spike your blood sugar as most starchy or sweet foods will.)  A few fruits do cause high blood sugars; use small portions of bananas (limit to 1/2 at a time), grapes, watermelon, oranges, and most tropical fruits.   Protein foods: Meat, fish, poultry, eggs, dairy foods, and beans such as pinto and kidney beans (beans also provide carbohydrate).   1. Eat at least 3 REAL meals and 1-2 snacks per day. Eat breakfast within the first hour of getting up.  Have something to eat at least every 5 hours while awake.   2. Limit starchy foods to TWO portions per meal and ONE per snack. ONE portion of a starchy food is equal to the following:   - ONE slice of bread (or its equivalent, such as half of a hamburger bun).    - 1/2 cup of a "scoopable" starchy food such as potatoes or rice.   - 15 grams of Total Carbohydrate as shown on food label.   - Every 4 ounces of a sweet drink (including fruit juice). 3. Include at every lunch and dinner: a protein food, a carb food, and vegetables.   - An ideal lunch or dinner includes twice the volume of veg's as protein or carbohydrate foods.  - Fresh or frozen vegetables are best.   - Keep frozen vegetables on hand for a quick option.     Follow-up: MyChart video visit on Thursday, December 23 at 1:30 PM.

## 2020-03-14 ENCOUNTER — Other Ambulatory Visit: Payer: Self-pay | Admitting: Internal Medicine

## 2020-03-17 ENCOUNTER — Ambulatory Visit (INDEPENDENT_AMBULATORY_CARE_PROVIDER_SITE_OTHER): Payer: 59 | Admitting: Family Medicine

## 2020-03-17 ENCOUNTER — Other Ambulatory Visit: Payer: Self-pay

## 2020-03-17 VITALS — BP 132/80 | Ht 66.0 in | Wt 181.0 lb

## 2020-03-17 DIAGNOSIS — Z3046 Encounter for surveillance of implantable subdermal contraceptive: Secondary | ICD-10-CM

## 2020-03-17 DIAGNOSIS — R232 Flushing: Secondary | ICD-10-CM | POA: Diagnosis not present

## 2020-03-17 DIAGNOSIS — Z780 Asymptomatic menopausal state: Secondary | ICD-10-CM

## 2020-03-17 DIAGNOSIS — L84 Corns and callosities: Secondary | ICD-10-CM

## 2020-03-17 DIAGNOSIS — L918 Other hypertrophic disorders of the skin: Secondary | ICD-10-CM | POA: Diagnosis not present

## 2020-03-17 NOTE — Patient Instructions (Signed)
It was nice seeing you today, Bailey Hooper.  Today, we removed your skin tag with electrocautery.  Please try to keep the areas clean as possible.  You have a callus on your foot.  I recommend warm water soaks every day.  If this gets worse, we can consider referring you to a foot doctor.  Please schedule a follow-up appointment in 1 week to make sure the wound is healing well.  The appointment can be with either of [place] or with your PCP Dr. Owens Shark.  Stay well, Bailey Button, MD    Skin Tag, Adult  A skin tag (acrochordon) is a soft, extra growth of skin. Most skin tags are flesh-colored and rarely bigger than a pencil eraser. They commonly form near areas where there are folds in the skin, such as the armpit or groin. Skin tags are not dangerous, and they do not spread from person to person (are not contagious). You may have one skin tag or several. Skin tags do not require treatment. However, your health care provider may recommend removal of a skin tag if it:  Gets irritated from clothing.  Bleeds.  Is visible and unsightly. Your health care provider can remove skin tags with a simple surgical procedure or a procedure that involves freezing the skin tag. Follow these instructions at home:  Watch for any changes in your skin tag. A normal skin tag does not require any other special care at home.  Take over-the-counter and prescription medicines only as told by your health care provider.  Keep all follow-up visits as told by your health care provider. This is important. Contact a health care provider if:  You have a skin tag that: ? Becomes painful. ? Changes color. ? Bleeds. ? Swells.  You develop more skin tags. This information is not intended to replace advice given to you by your health care provider. Make sure you discuss any questions you have with your health care provider. Document Revised: 03/08/2017 Document Reviewed: 04/10/2015 Elsevier Patient Education  [place] are small areas of thickened skin that occur on the top, sides, or tip of a toe. They contain a cone-shaped core with a point that can press on a nerve below. This causes pain.  Calluses are areas of thickened skin that can occur anywhere on the body, including the hands, fingers, palms, soles of the feet, and heels. Calluses are usually larger than corns. What are the causes? Corns and calluses are caused by rubbing (friction) or pressure, such as from shoes that are too tight or do not fit properly. What increases the risk? Corns are more likely to develop in people who have misshapen toes (toe deformities), such as hammer toes. Calluses can occur with friction to any area of the skin. They are more likely to develop in people who:  Work with their hands.  Wear shoes that fit poorly, are too tight, or are high-heeled.  Have toe deformities. What are the signs or symptoms? Symptoms of a corn or callus include:  A hard growth on the skin.  Pain or tenderness under the skin.  Redness and swelling.  Increased discomfort while wearing tight-fitting shoes, if your feet are affected. If a corn or callus becomes infected, symptoms may include:  Redness and swelling that gets worse.  Pain.  Fluid, blood, or pus draining from the corn or callus. How is this diagnosed? Corns and calluses may be diagnosed based on your symptoms, your medical  history, and a physical exam. How is this treated? Treatment for corns and calluses may include:  Removing the cause of the friction or pressure. This may involve: ? Changing your shoes. ? Wearing shoe inserts (orthotics) or other protective layers in your shoes, such as a corn pad. ? Wearing gloves.  Applying medicine to the skin (topical medicine) to help soften skin in the hardened, thickened areas.  Removing layers of dead skin with a file to reduce the size of the corn or callus.  Removing the corn or  callus with a scalpel or laser.  Taking antibiotic medicines, if your corn or callus is infected.  Having surgery, if a toe deformity is the cause. Follow these instructions at home:   Take over-the-counter and prescription medicines only as told by your health care provider.  If you were prescribed an antibiotic, take it as told by your health care provider. Do not stop taking it even if your condition starts to improve.  Wear shoes that fit well. Avoid wearing high-heeled shoes and shoes that are too tight or too loose.  Wear any padding, protective layers, gloves, or orthotics as told by your health care provider.  Soak your hands or feet and then use a file or pumice stone to soften your corn or callus. Do this as told by your health care provider.  Check your corn or callus every day for symptoms of infection. Contact a health care provider if you:  Notice that your symptoms do not improve with treatment.  Have redness or swelling that gets worse.  Notice that your corn or callus becomes painful.  Have fluid, blood, or pus coming from your corn or callus.  Have new symptoms. Summary  Corns are small areas of thickened skin that occur on the top, sides, or tip of a toe.  Calluses are areas of thickened skin that can occur anywhere on the body, including the hands, fingers, palms, and soles of the feet. Calluses are usually larger than corns.  Corns and calluses are caused by rubbing (friction) or pressure, such as from shoes that are too tight or do not fit properly.  Treatment may include wearing any padding, protective layers, gloves, or orthotics as told by your health care provider. This information is not intended to replace advice given to you by your health care provider. Make sure you discuss any questions you have with your health care provider. Document Revised: 07/16/2018 Document Reviewed: 02/06/2017 Elsevier Patient Education  2020 [employer].

## 2020-03-17 NOTE — Progress Notes (Addendum)
    SUBJECTIVE:   CHIEF COMPLAINT / HPI: Skin tag removal  Skin tag Patient states she has a skin tag on her right posterior thigh causing her discomfort, wanting it to be removed.  Has been present for about 6 months.  Patient states she previously had a skin tag removed in the same area several years ago, which has now recurred.  Foot lesion Patient has noticed over the past few days a dark painful spot on her foot with hardened skin.  Hurts when walking on it.   OBJECTIVE:   BP 132/80   Ht 5\' 6"  (1.676 m)   Wt 181 lb (82.1 kg)   SpO2 100%   BMI 29.21 kg/m   General: Overweight female, NAD Derm: Hyperpigmented pedunculated lesion to right posterior thigh. Left foot with hyperpigmented macule with hardened skin base. See image below:     ASSESSMENT/PLAN:   Acrochordon To right posterior thigh, previously excised and has now recurred.  Would likely benefit from electrocautery to prevent future recurrence.  See procedure note below.  Callous Noted to left foot. - recommended daily warm water soaks - consider podiatry referral if worsening  Skin Tag Removal Procedure Note Diagnosis: normal skin tag Location: right thigh Informed Consent: Discussed risks (permanent scarring, infection, pain, bleeding, bruising, redness, and recurrence of the lesion) and benefits of the procedure, as well as the alternatives. She is aware that skin tags are benign lesions, and their removal is often not considered medically necessary. Informed consent was obtained. Preparation: The area was prepared in a standard fashion. Anesthesia: Lidocaine 1% with epinephrine Procedure Details: Hyfrecator were used to perform sharp removal.  Ointment and bandage were applied where needed. The patient tolerated the procedure well. Total number of lesions treated: 1 Plan: The patient was instructed on post-op care. Recommend OTC analgesia as needed for pain.   [name], MD [place]

## 2020-03-17 NOTE — Progress Notes (Signed)
Nexplanon placed 10/14/2016  PROCEDURE NOTE: NEXPLANON  REMOVAL Patient given informed consent and signed copy in the chart. LEFTarm area prepped and draped in the usual sterile fashion. Three cc of lidocaine without epinephrine 1% used for local anesthesia. A small stab incision was made close to the nexplanon with scalpel. Hemostats were used to withdraw the nexplanon. A small bandage was applied over a steri strip  No complications.Patient given follow up instructions should she experience redness, swelling at sight or fever in the next 24 hours. Patient was reminded this totally removes her nexplanon contraceptive devise. (she can now potentially conceive).   We also discussed that at age 54 she is most likely postmenopausal.  We opted to get a lab draw for [facility] today.  She has not been on oral contraceptives she has been on progesterone only implant so it should be  accurate.  In the interim I also gave her a bag of condoms.

## 2020-03-17 NOTE — Patient Instructions (Signed)
It was great to see you! Thank you for allowing me to participate in your care!  Our plans for today:  -Today we removed your Implanon.  You may experience a little bit of bruising in the area over the next day or 2.  If you develop any redness, fevers, or other concerning symptoms please do not hesitate to reach out to [place]. -We are checking a lab today to look for signs of you being in menopause.  We are also providing some condoms to use in the meantime.  I wish you happy holidays!  Take care and seek immediate care sooner if you develop any concerns.   [facility] clinic [facility]

## 2020-03-18 LAB — FOLLICLE STIMULATING HORMONE: FSH: 45.5 m[IU]/mL

## 2020-03-21 ENCOUNTER — Encounter: Payer: Self-pay | Admitting: Family Medicine

## 2020-03-21 ENCOUNTER — Telehealth: Payer: Self-pay | Admitting: Family Medicine

## 2020-03-21 NOTE — Telephone Encounter (Signed)
Patient is calling Doctor Owens Shark back in regards to test results. Patient stated that Doctor Owens Shark could leave it on her voicemail due to patient being in the process of moving and may not be able to get to the phone in time she said it would be okay to leave a voicemail. Thanks

## 2020-03-21 NOTE — Telephone Encounter (Signed)
Sent message via MyChart.

## 2020-03-30 ENCOUNTER — Ambulatory Visit: Payer: 59 | Admitting: Physician Assistant

## 2020-03-31 ENCOUNTER — Telehealth: Payer: 59 | Admitting: Family Medicine

## 2020-04-08 ENCOUNTER — Encounter: Payer: Self-pay | Admitting: Family Medicine

## 2020-04-18 ENCOUNTER — Telehealth: Payer: 59 | Admitting: Family Medicine

## 2020-04-19 ENCOUNTER — Encounter: Payer: Self-pay | Admitting: Family Medicine

## 2020-04-19 ENCOUNTER — Other Ambulatory Visit: Payer: Self-pay

## 2020-04-19 ENCOUNTER — Ambulatory Visit (INDEPENDENT_AMBULATORY_CARE_PROVIDER_SITE_OTHER): Payer: 59 | Admitting: Family Medicine

## 2020-04-19 VITALS — BP 160/90 | HR 74 | Ht 66.0 in | Wt 178.0 lb

## 2020-04-19 DIAGNOSIS — R079 Chest pain, unspecified: Secondary | ICD-10-CM

## 2020-04-19 DIAGNOSIS — K219 Gastro-esophageal reflux disease without esophagitis: Secondary | ICD-10-CM | POA: Diagnosis not present

## 2020-04-19 NOTE — Progress Notes (Signed)
Subjective:   Patient ID: Bailey Hooper    DOB: 09/13/1965, 55 y.o. female   MRN: [id number]  Caree Wolpert is a 55 y.o. female with a history of HTN, hot flashes, migraine headache, asthma, GERD, IBS, BPPV, genital herpes, bipolar disorder, HLD, heart murmur, MDD, tobacco abuse here for follow up Chest pain.  Chest Pain follow up Patient evaluated in urgent care on 12/31 for chest pain. At that time, she endorsed 6 days of left sided chest pain that she described as "dull, achy, pulling, and radiates to her back". These symptoms were associated with belching, nausea, and worsened with movement. She did note some heavy lifting prior to the pain development, but denied any specific injury. Per ED evaluation, pain was reproducible to palpation. EKG with NSR and 1st degree AV block, left axis deviation, age undetermined, no ST changes. CXR were normal. Mag 2.2, Lipase 116, BNP 7.0, Tropinin 8>6, CBC normal, CMP normal, Thyroid 1.077, . She was treated with GI cocktail, flexeril, and aspirin with improvement in her symptoms.  Today she notes it is not continuous anymore. She notes now it comes and goes. She has occasional numbness up her arm to shoulder and front chest. She notes that initially her pain happened when she moved or leaned forward but that has since resolved. She notes it comes and goes, at rest and with exertion.  Since the urgent care visit she has been doing heat/ice and ibuprofen, Flexeril (her daughter's medication). This helped her sleep. Ibuprofen eventually helps. Denies palpitation or irregular heart beat. Denies SOB with exertion.  Denies family history of MI.   Risk factors for ACS include HLD and tobacco abuse (3-4 cigarettes a day). OSA compliant with CPAP most nights   Normal TSH in 02/01/20. Normal B12 at 539 in 01/08/20.  Plans to follow up with GI. Currently takes Dexlansoprazole. Has been seen by cardiology in 2020.   Review of Systems:  Per HPI.   Objective:   BP (!)  160/90   Pulse 74   Ht 5\' 6"  (1.676 m)   Wt 178 lb (80.7 kg)   SpO2 98%   BMI 28.73 kg/m  Vitals and nursing note reviewed.  General: pleasant middle aged woman, sitting comfortably in exam chair, well nourished, well developed, in no acute distress with non-toxic appearance CV: regular rate and rhythm without murmurs, rubs, or gallops Lungs: clear to auscultation bilaterally with normal work of breathing on room air, speaking in full sentences Abdomen: soft, non-tender, non-distended Skin: warm, dry Extremities: warm and well perfused, normal tone MSK: UE strength normal and symmetric bilaterally, gait normal, tenderness along anterior and posterior left chest wall to palpation, negative tinels at carpel tunnel, negative spurlings, negative adson test Neuro: Alert and oriented, speech normal  Prior Imaging:   Echo 12/09/18: Impression: LV 60-65%, impaired relaxation. RV normal. Valves normal. Aorta normal.   Split Night Sleep Study: 05/2018 Moderate to severe OSA  Assessment & Plan:   Chest pain Subacute, improving. Now intermittent. ED work up very reassuring. Does not appear coronary in origin. Tenderness to palpation and improvement with rest and ibuprofen makes MSK most likely cause. GERD could be contributing given improvement with GI cocktail. Neuropathic pain could indicate possible nerve irritation however special testing negative for specific neurologic origin.  - Take Naproxen/Ibuprofen every 6 hours as needed - trial topical analgesics such as voltaren gel, icey-hot with lidocaine, capsaicin - consider referral to Dr. Jayme Cloud for myofascial release / OMT  - Continue  Dexlansoprazole but use Gaviscon / Mylanta / Maalox for breakthrough symptoms - call GI to schedule appointment for follow up - given some neuropathic like symptoms, can trial home Gabapentin  - follow up with Dr. Owens Shark in 2-3 weeks, sooner if worsening  GERD (gastroesophageal reflux disease) Plan as  above   Mina Marble, DO PGY-3, [facility] 04/20/2020 7:45 PM

## 2020-04-19 NOTE — Patient Instructions (Signed)
It was a pleasure to see you today!  Thank you for choosing [facility] for your primary care.   Our plans for today were:  Call GI to schedule appointment for follow up  Take Naproxen/Ibuprofen every 6 hours as needed  Can try Voltaren gel, Icey-Hot with Lidocaine, Capsaicin   You may try your home Gabapentin to see if that helps with the numbness and tingling  Continue the Dexlansoprazole but use Gaviscon / Mylanta / Maalox for breakthrough pain   Follow up with Dr. Owens Shark in 2-3 weeks, sooner if worsening  Best Wishes,   Mina Marble, DO

## 2020-04-20 DIAGNOSIS — R079 Chest pain, unspecified: Secondary | ICD-10-CM | POA: Insufficient documentation

## 2020-04-20 NOTE — Assessment & Plan Note (Signed)
Plan as above.  

## 2020-04-20 NOTE — Assessment & Plan Note (Addendum)
Subacute, improving. Now intermittent. ED work up very reassuring. Does not appear coronary in origin. Tenderness to palpation and improvement with rest and ibuprofen makes MSK most likely cause. GERD could be contributing given improvement with GI cocktail. Neuropathic pain could indicate possible nerve irritation however special testing negative for specific neurologic origin.  - Take Naproxen/Ibuprofen every 6 hours as needed - trial topical analgesics such as voltaren gel, icey-hot with lidocaine, capsaicin - consider referral to Dr. Jayme Cloud for myofascial release / OMT  - Continue Dexlansoprazole but use Gaviscon / Mylanta / Maalox for breakthrough symptoms - call GI to schedule appointment for follow up - given some neuropathic like symptoms, can trial home Gabapentin  - follow up with Dr. Owens Shark in 2-3 weeks, sooner if worsening

## 2020-04-27 ENCOUNTER — Telehealth: Payer: Self-pay | Admitting: Family Medicine

## 2020-04-27 DIAGNOSIS — F419 Anxiety disorder, unspecified: Secondary | ICD-10-CM

## 2020-04-27 MED ORDER — AMLODIPINE BESYLATE 10 MG PO TABS: 10.0000 mg | ORAL_TABLET | Freq: Every day | ORAL | 3 refills | Status: DC

## 2020-04-27 MED ORDER — HYDROXYZINE HCL 25 MG PO TABS: ORAL_TABLET | ORAL | 1 refills | Status: DC

## 2020-04-27 NOTE — Telephone Encounter (Signed)
Called patient and discussed options. Clonazepam not started by provider, will Rx appropriate alternative.  All questions answered.  Dorris Singh, MD  Family Medicine Teaching Service

## 2020-05-01 ENCOUNTER — Encounter: Payer: Self-pay | Admitting: Family Medicine

## 2020-05-01 DIAGNOSIS — G4739 Other sleep apnea: Secondary | ICD-10-CM

## 2020-05-11 ENCOUNTER — Encounter: Payer: Self-pay | Admitting: Physician Assistant

## 2020-05-11 ENCOUNTER — Ambulatory Visit: Payer: 59 | Admitting: Physician Assistant

## 2020-05-11 VITALS — BP 116/80 | HR 72 | Ht 66.0 in | Wt 182.5 lb

## 2020-05-11 DIAGNOSIS — Z800 Family history of malignant neoplasm of digestive organs: Secondary | ICD-10-CM | POA: Diagnosis not present

## 2020-05-11 DIAGNOSIS — R1013 Epigastric pain: Secondary | ICD-10-CM

## 2020-05-11 DIAGNOSIS — R194 Change in bowel habit: Secondary | ICD-10-CM

## 2020-05-11 DIAGNOSIS — R1314 Dysphagia, pharyngoesophageal phase: Secondary | ICD-10-CM | POA: Diagnosis not present

## 2020-05-11 MED ORDER — AMBULATORY NON FORMULARY MEDICATION: 0 refills | Status: DC

## 2020-05-11 NOTE — Patient Instructions (Signed)
If you are age 55 or older, your body mass index should be between 23-30. Your Body mass index is 29.46 kg/m. If this is out of the aforementioned range listed, please consider follow up with your Primary Care Provider.  If you are age 11 or younger, your body mass index should be between 19-25. Your Body mass index is 29.46 kg/m. If this is out of the aformentioned range listed, please consider follow up with your Primary Care Provider.   We have sent the following medications to your pharmacy for you to pick up at your convenience: GI cock tail  Increase Dexilant to 60 mg daily.   It has been recommended to you by your physician that you have a(n) Colonoscopy  completed. Per your request, we did not schedule the procedure(s) today. Please contact our office at [phone] should you decide to have the procedure completed. You will be scheduled for a pre-visit and procedure at that time.   Thank you for choosing me and [facility].  Dewitt Hoes, PA-C

## 2020-05-11 NOTE — Progress Notes (Signed)
Chief Complaint: Epigastric pain, GERD, family history of colon cancer  HPI:    Bailey Hooper is a 55 year old African-American female with a past medical history of reflux and IBS, previously known to Dr. Deatra Ina, now established with Dr. Silverio Decamp, who was referred to me by Martyn Malay, MD for a complaint of epigastric pain, GERD no family history of colon cancer.      08/01/2016 colonoscopy with diverticulosis in the sigmoid and ascending colon, 4 2-5 mm polyps in the rectum, descending colon, transverse colon which were hyperplastic and nonbleeding internal hemorrhoids.  Repeat recommended in 5 years.      08/01/2016 EGD with gastritis and otherwise normal.  Biopsies normal.    04/08/2020 patient seen in the ED for chest pain, this is ruled out has cardiac in origin and patient improved with GI cocktail.    Today, the patient tells me that about a month ago she developed some abnormal epigastric/chest pain which seemed to radiate down into her arm and fingers, initially she thought this was just gas that was trapped because if anyone massaged her she would burp and that did make it feel slightly better, but it carried on for another couple of days and was so intense that it was making her cry.  She proceeded to the urgent care where they did an EKG and told her that she was okay.  It continued into the next couple of days and she went to the ER where she was again told that it is okay.  Does note that symptoms improved with a GI cocktail that she was given there.  Had also tried over-the-counter Mylanta and Gas-X which did not help at home.  Now has continued with this discomfort regardless of taking her Dexilant 30 mg at night.  Tells me that over the past few days she is just now getting her appetite back after all of this happened over a month ago.  Describes that when she goes to eat she becomes extremely nauseous and feels like she is going to vomit.  Also describes that she feels like food sometimes  gets stuck in her throat on the way down.    Also complains of irritable bowel type symptoms.  She radiates back-and-forth from diarrhea to constipation which seems to have gotten slightly worse recently.  She is nervous due to her family history of colon cancer in her father.    Also describes a history of anxiety, apparently she is currently on Symbyax and tells me this does help with her anxiety but she has heard that some medicines also help with irritable bowel and she would like to know what those are so she could maybe take care of two problems at once.    Denies fever, chills or blood in her stool.  Past Medical History:  Diagnosis Date  . Anxiety   . Asthma   . Depression   . Fibroids   . GERD (gastroesophageal reflux disease)   . High cholesterol   . Hypertension   . IBS (irritable bowel syndrome)   . Migraines   . Prediabetes   . Sleep apnea   . Tobacco use   . Vertigo     Past Surgical History:  Procedure Laterality Date  . CESAREAN SECTION    . FOOT SURGERY     Left foot    Current Outpatient Medications  Medication Sig Dispense Refill  . albuterol (VENTOLIN HFA) 108 (90 Base) MCG/ACT inhaler Inhale 2 puffs into the  lungs every 6 (six) hours as needed for wheezing. 18 g 2  . amLODipine (NORVASC) 10 MG tablet Take 1 tablet (10 mg total) by mouth daily. 90 tablet 3  . azelastine (OPTIVAR) 0.05 % ophthalmic solution Place 1 drop into both eyes 2 (two) times daily. (Patient taking differently: Place 1 drop into both eyes daily as needed.) 6 mL 12  . BLACK CURRANT SEED OIL PO Take by mouth. 1 daily    . cetirizine (ZYRTEC) 10 MG tablet Take 1 tablet (10 mg total) by mouth daily. 30 tablet 3  . Dexlansoprazole 30 MG capsule Take 1 capsule (30 mg total) by mouth daily. 30 capsule 3  . dicyclomine (BENTYL) 20 MG tablet 1/2 to 1 tab 3 times daily before meals and at bedtime 90 tablet 1  . fluocinonide cream (LIDEX) 0.10 % Apply 1 application topically 2 (two) times daily.     Marland Kitchen gabapentin (NEURONTIN) 300 MG capsule Take 1 capsule (300 mg total) by mouth at bedtime. 30 capsule 0  . hydrOXYzine (ATARAX/VISTARIL) 25 MG tablet Take 1/2 to one tab daily for anxiety and sleep 90 tablet 1  . ibuprofen (ADVIL) 800 MG tablet Take 1 tablet (800 mg total) by mouth 3 (three) times daily. 30 tablet 0  . ipratropium (ATROVENT) 0.03 % nasal spray Place 2 sprays into both nostrils every 12 (twelve) hours. 30 mL 6  . ketoconazole (NIZORAL) 2 % cream Apply 1 application topically daily.     . Melatonin 1 MG CAPS Take by mouth.    . metoprolol succinate (TOPROL-XL) 25 MG 24 hr tablet Take 1 tablet (25 mg total) by mouth at bedtime. Take with or immediately following a meal. 90 tablet 3  . mirtazapine (REMERON) 30 MG tablet 1 tab by mouth daily at bedtime 30 tablet 11  . mometasone (NASONEX) 50 MCG/ACT nasal spray 2 sprays each nostril daily 17 g 12  . montelukast (SINGULAIR) 10 MG tablet Take 1 tablet (10 mg total) by mouth at bedtime. 30 tablet 11  . Multiple Minerals-Vitamins (CAL MAG ZINC +D3) TABS Take 1 tablet by mouth daily.    . Multiple Vitamin (MULTIVITAMIN) capsule Take 1 capsule by mouth daily.    Marland Kitchen OLANZapine-FLUoxetine (SYMBYAX) 12-25 MG capsule Take 1 capsule by mouth every evening.    . SUMAtriptan (IMITREX) 50 MG tablet May repeat in 2 hours if headache persists or recurs with max of 100 mg in 24 hours. 9 tablet 11  . traZODone (DESYREL) 50 MG tablet Take 0.5-1 tablets (25-50 mg total) by mouth at bedtime as needed for sleep. 15 tablet 0   No current facility-administered medications for this visit.    Allergies as of 05/11/2020 - Review Complete 05/11/2020  Allergen Reaction Noted  . Lisinopril Palpitations and Cough 10/05/2013  . Lamictal [lamotrigine] Hives 10/15/2018  . Penicillins Rash 01/17/2009  . Erythromycin Nausea And Vomiting 08/16/2012    Family History  Problem Relation Age of Onset  . Colon cancer Other        Father  . Cancer Other         Oral, uncle  . Alcohol abuse Other        Father  . Drug abuse Other        Father  . Osteoporosis Other   . Depression Other   . Hypertension Other   . Diabetes Other   . Heart disease Other        Grandmother  . Colon cancer Father   .  Heart disease Father   . Diabetes Father   . Hypertension Father   . Dementia Father   . Heart disease Maternal Grandmother   . Diabetes Maternal Grandmother   . Diabetes Mother   . Depression Mother   . Hypertension Mother   . Esophageal cancer Neg Hx     Social History   Socioeconomic History  . Marital status: Divorced    Spouse name: Not on file  . Number of children: 1  . Years of education: 60  . Highest education level: High school graduate  Occupational History  . Not on file  Tobacco Use  . Smoking status: Current Every Day Smoker    Packs/day: 0.25    Years: 20.00    Pack years: 5.00    Types: Cigarettes    Start date: 04/09/1986  . Smokeless tobacco: Never Used  . Tobacco comment: Previous 0.5 PPD. trying to quitt. Does not smoke when sick  Vaping Use  . Vaping Use: Never used  Substance and Sexual Activity  . Alcohol use: Yes    Alcohol/week: 0.0 standard drinks    Comment: rarely  . Drug use: No  . Sexual activity: Yes  Other Topics Concern  . Not on file  Social History Narrative   Lives with: Alone (grandaughters visit throughout the week, ages 42 and 55 y/o)   Works: Unemployed in spring 2020 due to COVID-19   Married: divorced      Social Determinants of Radio broadcast assistant Strain: Not on Art therapist Insecurity: Not on file  Transportation Needs: Not on file  Physical Activity: Not on file  Stress: Not on file  Social Connections: Not on file  Intimate Partner Violence: Not on file    Review of Systems:    Constitutional: No weight loss, fever or chills Skin: No rash  Cardiovascular: +atypical chest pain Respiratory: No SOB  Gastrointestinal: See HPI and otherwise negative Genitourinary:  No dysuria  Neurological: No headache, dizziness or syncope Musculoskeletal: No new muscle or joint pain Hematologic: No bleeding  Psychiatric: +anxiety   Physical Exam:  Vital signs: BP 116/80 (BP Location: Left Arm, Patient Position: Sitting, Cuff Size: Normal)   Pulse 72   Ht 5\' 6"  (1.676 m) Comment: height measured without shoes  Wt 182 lb 8 oz (82.8 kg)   BMI 29.46 kg/m   Constitutional:   Pleasant AA female appears to be in NAD, Well developed, Well nourished, alert and cooperative Head:  Normocephalic and atraumatic. Eyes:   PEERL, EOMI. No icterus. Conjunctiva pink. Ears:  Normal auditory acuity. Neck:  Supple Throat: Oral cavity and pharynx without inflammation, swelling or lesion.  Respiratory: Respirations even and unlabored. Lungs clear to auscultation bilaterally.   No wheezes, crackles, or rhonchi.  Cardiovascular: Normal S1, S2. No MRG. Regular rate and rhythm. No peripheral edema, cyanosis or pallor.  Gastrointestinal:  Soft, nondistended, nontender. No rebound or guarding. Normal bowel sounds. No appreciable masses or hepatomegaly. Rectal:  Not performed.  Msk:  Symmetrical without gross deformities. Without edema, no deformity or joint abnormality.  Neurologic:  Alert and  oriented x4;  grossly normal neurologically.  Skin:   Dry and intact without significant lesions or rashes. Psychiatric: Demonstrates good judgement and reason without abnormal affect or behaviors.  RELEVANT LABS AND IMAGING: CBC    Component Value Date/Time   WBC 6.8 02/01/2020 1217   WBC 9.6 06/30/2012 2230   RBC 4.90 02/01/2020 1217   RBC 5.16 (H) 06/30/2012 2230  HGB 12.8 02/01/2020 1217   HCT 40.6 02/01/2020 1217   PLT 337 02/01/2020 1217   MCV 83 02/01/2020 1217   MCH 26.1 (L) 02/01/2020 1217   MCH 26.9 06/30/2012 2230   MCHC 31.5 02/01/2020 1217   MCHC 33.3 06/30/2012 2230   RDW 13.8 02/01/2020 1217   LYMPHSABS 3.9 06/30/2012 2230   MONOABS 0.7 06/30/2012 2230   EOSABS 0.3  06/30/2012 2230   BASOSABS 0.1 06/30/2012 2230    CMP     Component Value Date/Time   NA 141 01/08/2020 1007   K 4.1 01/08/2020 1007   CL 103 01/08/2020 1007   CO2 23 01/08/2020 1007   GLUCOSE 113 (H) 01/08/2020 1007   GLUCOSE 119 (H) 06/11/2016 1637   BUN 9 01/08/2020 1007   CREATININE 0.68 01/08/2020 1007   CREATININE 0.71 06/11/2016 1637   CALCIUM 9.3 01/08/2020 1007   PROT 7.2 02/01/2020 1217   ALBUMIN 4.3 02/01/2020 1217   AST 11 02/01/2020 1217   ALT 13 02/01/2020 1217   ALKPHOS 107 02/01/2020 1217   BILITOT 0.3 02/01/2020 1217   GFRNONAA 100 01/08/2020 1007   GFRNONAA >89 06/11/2016 1637   GFRAA 115 01/08/2020 1007   GFRAA >89 06/11/2016 1637    Assessment: 1.  Epigastric pain/atypical chest pain: Over the past couple of months, cardiac origin has been ruled out, some relief with GI cocktail in the ED; consider gastritis+/-functional dyspepsia with history of anxiety 2.  Dysphagia: Food gets stuck on the way down at times; consider esophagitis+/-stricture 3.  Family history of colon cancer: In her father 10.  Change in bowel habits: Increased variation between diarrhea and constipation; likely still IBS  Plan: 1.  Recommend patient schedule for a diagnostic EGD and colonoscopy in the [place].  Dr. Silverio Decamp did not have any availability for a double until April.  Patient was provided with Dr. Vivia Ewing availability as well..  Patient was provided with a detailed list of risk for the procedure and she agrees to proceed.  After I left the room patient decided to wait to schedule, but she does have Dr. Vivia Ewing schedule as well as Dr. Woodward Ku. 2.  Increase Dexilant to 60 mg daily and recommend she take this in the morning.  Prescribed #30 with 5 refills. 3.  Prescribed GI cocktail 5-10 mL every 4-6 hours as needed for epigastric pain. 4.   Patient to follow in clinic per recommendations from Dr. Silverio Decamp after time of procedures.  Bailey Newer, PA-C [facility] 05/11/2020, 11:14 AM  Cc: Martyn Malay, MD

## 2020-05-19 NOTE — Progress Notes (Signed)
Agree with the assessment and plan as outlined by Jennifer Lemmon, PA-C. ? ?Vito Cirigliano, DO, FACG ? ?

## 2020-05-20 ENCOUNTER — Other Ambulatory Visit: Payer: Self-pay | Admitting: Family Medicine

## 2020-05-20 DIAGNOSIS — G4733 Obstructive sleep apnea (adult) (pediatric): Secondary | ICD-10-CM

## 2020-05-20 NOTE — Addendum Note (Signed)
Addended by: Christen Bame D on: 05/20/2020 05:41 PM   Modules accepted: Orders

## 2020-05-25 ENCOUNTER — Telehealth: Payer: Self-pay | Admitting: *Deleted

## 2020-05-25 NOTE — Telephone Encounter (Signed)
Called patient x3 today for a phone PV. Patient did not answer, I left 2 messages for the patient to call us back today before 5 pm.

## 2020-05-25 NOTE — Telephone Encounter (Signed)
Pt called back, pv r/s to 2/18 at 4:30pm.

## 2020-05-27 ENCOUNTER — Ambulatory Visit (AMBULATORY_SURGERY_CENTER): Payer: 59 | Admitting: *Deleted

## 2020-05-27 ENCOUNTER — Encounter: Payer: 59 | Admitting: Gastroenterology

## 2020-05-27 ENCOUNTER — Other Ambulatory Visit: Payer: Self-pay

## 2020-05-27 VITALS — Ht 66.0 in | Wt 180.0 lb

## 2020-05-27 DIAGNOSIS — R194 Change in bowel habit: Secondary | ICD-10-CM

## 2020-05-27 DIAGNOSIS — Z800 Family history of malignant neoplasm of digestive organs: Secondary | ICD-10-CM

## 2020-05-27 DIAGNOSIS — R1013 Epigastric pain: Secondary | ICD-10-CM

## 2020-05-27 DIAGNOSIS — R1314 Dysphagia, pharyngoesophageal phase: Secondary | ICD-10-CM

## 2020-05-27 MED ORDER — NA SULFATE-K SULFATE-MG SULF 17.5-3.13-1.6 GM/177ML PO SOLN: 1.0000 | Freq: Once | ORAL | 0 refills | Status: AC

## 2020-05-27 NOTE — Progress Notes (Signed)
No egg or soy allergy known to patient  No issues with past sedation with any surgeries or procedures No intubation problems in the past  No FH of Malignant Hyperthermia No diet pills per patient No home 02 use per patient  No blood thinners per patient  Pt denies issues with constipation  No A fib or A flutter  EMMI video to pt or via [facility] guidelines implemented in PV today with Pt and RN  Pt is fully vaccinated  for Covid  Pt denies loose or missing teeth, denies dentures, partials, dental implants, capped or bonded teeth  Virtual pre-visit completed. Covid screen requested via voicemail at [facility]. Advised patient would come in Friday 06/10/2020 @ 3 pm for her pre-procedure screening.   Due to the COVID-19 pandemic we are asking patients to follow certain guidelines.  Pt aware of COVID protocols and [facility] guidelines

## 2020-06-03 ENCOUNTER — Telehealth (INDEPENDENT_AMBULATORY_CARE_PROVIDER_SITE_OTHER): Payer: 59 | Admitting: Family Medicine

## 2020-06-03 ENCOUNTER — Encounter: Payer: Self-pay | Admitting: Family Medicine

## 2020-06-03 ENCOUNTER — Other Ambulatory Visit: Payer: Self-pay

## 2020-06-03 DIAGNOSIS — I10 Essential (primary) hypertension: Secondary | ICD-10-CM

## 2020-06-03 DIAGNOSIS — F419 Anxiety disorder, unspecified: Secondary | ICD-10-CM

## 2020-06-03 DIAGNOSIS — K589 Irritable bowel syndrome without diarrhea: Secondary | ICD-10-CM

## 2020-06-03 DIAGNOSIS — R232 Flushing: Secondary | ICD-10-CM | POA: Diagnosis not present

## 2020-06-03 DIAGNOSIS — J301 Allergic rhinitis due to pollen: Secondary | ICD-10-CM

## 2020-06-03 DIAGNOSIS — J45909 Unspecified asthma, uncomplicated: Secondary | ICD-10-CM

## 2020-06-03 DIAGNOSIS — F331 Major depressive disorder, recurrent, moderate: Secondary | ICD-10-CM | POA: Diagnosis not present

## 2020-06-03 DIAGNOSIS — F319 Bipolar disorder, unspecified: Secondary | ICD-10-CM

## 2020-06-03 DIAGNOSIS — G43909 Migraine, unspecified, not intractable, without status migrainosus: Secondary | ICD-10-CM

## 2020-06-03 DIAGNOSIS — K219 Gastro-esophageal reflux disease without esophagitis: Secondary | ICD-10-CM

## 2020-06-03 MED ORDER — DEXLANSOPRAZOLE 30 MG PO CPDR: 60.0000 mg | DELAYED_RELEASE_CAPSULE | Freq: Every day | ORAL | 3 refills | Status: DC

## 2020-06-03 MED ORDER — DICYCLOMINE HCL 20 MG PO TABS: ORAL_TABLET | ORAL | 1 refills | Status: DC

## 2020-06-03 NOTE — Assessment & Plan Note (Signed)
She is taking gabapentin only as needed currently.  We discussed that as she is on SSRI therapy is unlikely that additional SNRI therapy would provide benefit.  We will send my chart information about phytoestrogens.

## 2020-06-03 NOTE — Assessment & Plan Note (Signed)
Will send information about outpatient behavioral health in [place].  Supportive listening provided.

## 2020-06-03 NOTE — Progress Notes (Signed)
[facility] Telemedicine Visit  Patient consented to have virtual visit and was identified by name and date of birth. Method of visit: Telephone  Encounter participants: Patient: Bailey Hooper - located at [facility] Provider: Martyn Malay - located at office Others (if applicable): none   Chief Complaint: hot flashes  HPI:  Bailey Hooper is a very pleasant 22 and woman with history significant for mood disorder, multiple social stressors and hypertension presenting today for routine follow-up.  We reviewed her medications today.  She is taking gabapentin as needed.  She is also taking a number of antihistamines.  The patient's primary concern today is sleep and hot flashes.  She reports that her hot flashes come in waves.  They occur for 2 to 3 days in a row for several weeks and then go away.  They keep her up at night and during the day.  She is on olanzapine as well as an SSRI.  She has additional vaginal dryness and also endorses dry mouth which is addressed below.  No vaginal bleeding.  She is interested in other therapies for this or potentially an over-the-counter supplement.  The patient has a number of new stressors in life.  She is living in [place] with her daughter and grandchildren.  Her uncle recently died from Covid and then she is concerned about her mother who is also ill.  She has difficulty traveling to [place] due to the cost of gas.  She would like to see a psychiatrist in [place] if possible.  She is close to [employer].  Patient's sleep continues to be disrupted.  She takes hydroxyzine at night.  She denies thoughts of hurting herself or others.  She reports she feels better than she has but does have a dry mouth related to Bentyl and Atarax use. She is interested in therapy and psychiatry.  Patient recently was seen by physicians assistant in gastroenterology.  She was hoping to see a physician as she would like to meet the person during her colonoscopy and  endoscopy.  She was not given an increase in the amount of Dexilant she was taking and was told there was possibly medication for her stomach and her mood.  Unclear from Bailey Hooper's  note as to which medication this is.  ROS: per HPI  Pertinent PMHx:  Reviewed and updated as appropriate healthcare maintenance items reviewed  Exam:  Speaking in full sentences, pleasant   Assessment/Plan:  Hot flashes She is taking gabapentin only as needed currently.  We discussed that as she is on SSRI therapy is unlikely that additional SNRI therapy would provide benefit.  We will send my chart information about phytoestrogens.  Major depressive disorder, recurrent episode, moderate ([place]) Will send information about outpatient behavioral health in [place].  Supportive listening provided.   GERD Patient reports she needs a new Rx for Dexilant--she was instructed by gastroenterology to increase to 60 mg. Suspect Bailey Hooper may have been talking about TCA or alternative. Already on SSRI and atypical. Will message.  Follow up in 2 weeks to check symptoms-reminder to self.   Time spent during visit with patient: 11 minutes

## 2020-06-06 ENCOUNTER — Encounter: Payer: Self-pay | Admitting: Family Medicine

## 2020-06-06 NOTE — Progress Notes (Signed)
Reviewed and agree with documentation and assessment and plan. K. Veena Cosimo Schertzer , MD   

## 2020-06-10 ENCOUNTER — Other Ambulatory Visit: Payer: Self-pay | Admitting: Gastroenterology

## 2020-06-13 LAB — SARS CORONAVIRUS 2 (TAT 6-24 HRS): SARS Coronavirus 2: NEGATIVE

## 2020-06-14 ENCOUNTER — Encounter: Payer: Self-pay | Admitting: Certified Registered Nurse Anesthetist

## 2020-06-14 MED ORDER — DICYCLOMINE HCL 20 MG PO TABS: ORAL_TABLET | ORAL | 1 refills | Status: DC

## 2020-06-14 MED ORDER — HYDROXYZINE HCL 25 MG PO TABS
ORAL_TABLET | ORAL | 1 refills | Status: DC
Start: 2020-06-14 — End: 2021-03-27

## 2020-06-14 MED ORDER — CETIRIZINE HCL 10 MG PO TABS: 10.0000 mg | ORAL_TABLET | Freq: Every day | ORAL | 3 refills | Status: DC

## 2020-06-14 MED ORDER — ALBUTEROL SULFATE HFA 108 (90 BASE) MCG/ACT IN AERS: 2.0000 | INHALATION_SPRAY | Freq: Four times a day (QID) | RESPIRATORY_TRACT | 2 refills | Status: DC | PRN

## 2020-06-14 MED ORDER — MOMETASONE FUROATE 50 MCG/ACT NA SUSP: NASAL | 12 refills | Status: DC

## 2020-06-14 MED ORDER — LANSOPRAZOLE 30 MG PO CPDR: 30.0000 mg | DELAYED_RELEASE_CAPSULE | Freq: Every day | ORAL | 3 refills | Status: DC

## 2020-06-14 MED ORDER — SUMATRIPTAN SUCCINATE 50 MG PO TABS: ORAL_TABLET | ORAL | 11 refills | Status: DC

## 2020-06-14 MED ORDER — OLANZAPINE-FLUOXETINE HCL 12-25 MG PO CAPS
1.0000 | ORAL_CAPSULE | Freq: Every evening | ORAL | 0 refills | Status: DC
Start: 2020-06-14 — End: 2020-07-12

## 2020-06-14 MED ORDER — METOPROLOL SUCCINATE ER 25 MG PO TB24: 25.0000 mg | ORAL_TABLET | Freq: Every day | ORAL | 3 refills | Status: DC

## 2020-06-14 MED ORDER — AMLODIPINE BESYLATE 10 MG PO TABS: 10.0000 mg | ORAL_TABLET | Freq: Every day | ORAL | 3 refills | Status: DC

## 2020-06-14 MED ORDER — MONTELUKAST SODIUM 10 MG PO TABS: 10.0000 mg | ORAL_TABLET | Freq: Every day | ORAL | 11 refills | Status: DC

## 2020-06-14 MED ORDER — TRAZODONE HCL 50 MG PO TABS: 25.0000 mg | ORAL_TABLET | Freq: Every evening | ORAL | 0 refills | Status: DC | PRN

## 2020-06-15 ENCOUNTER — Ambulatory Visit (AMBULATORY_SURGERY_CENTER): Payer: 59 | Admitting: Gastroenterology

## 2020-06-15 ENCOUNTER — Encounter: Payer: 59 | Admitting: Gastroenterology

## 2020-06-15 ENCOUNTER — Other Ambulatory Visit: Payer: Self-pay

## 2020-06-15 ENCOUNTER — Encounter: Payer: Self-pay | Admitting: Gastroenterology

## 2020-06-15 VITALS — BP 134/82 | HR 78 | Temp 96.8°F | Resp 12 | Ht 66.0 in | Wt 182.0 lb

## 2020-06-15 DIAGNOSIS — K621 Rectal polyp: Secondary | ICD-10-CM

## 2020-06-15 DIAGNOSIS — K635 Polyp of colon: Secondary | ICD-10-CM | POA: Diagnosis not present

## 2020-06-15 DIAGNOSIS — K2970 Gastritis, unspecified, without bleeding: Secondary | ICD-10-CM | POA: Diagnosis not present

## 2020-06-15 DIAGNOSIS — K648 Other hemorrhoids: Secondary | ICD-10-CM | POA: Diagnosis not present

## 2020-06-15 DIAGNOSIS — R194 Change in bowel habit: Secondary | ICD-10-CM | POA: Diagnosis not present

## 2020-06-15 DIAGNOSIS — K641 Second degree hemorrhoids: Secondary | ICD-10-CM

## 2020-06-15 DIAGNOSIS — R1314 Dysphagia, pharyngoesophageal phase: Secondary | ICD-10-CM

## 2020-06-15 DIAGNOSIS — K5730 Diverticulosis of large intestine without perforation or abscess without bleeding: Secondary | ICD-10-CM | POA: Diagnosis not present

## 2020-06-15 DIAGNOSIS — K2950 Unspecified chronic gastritis without bleeding: Secondary | ICD-10-CM

## 2020-06-15 DIAGNOSIS — R1310 Dysphagia, unspecified: Secondary | ICD-10-CM

## 2020-06-15 DIAGNOSIS — D125 Benign neoplasm of sigmoid colon: Secondary | ICD-10-CM

## 2020-06-15 DIAGNOSIS — K219 Gastro-esophageal reflux disease without esophagitis: Secondary | ICD-10-CM | POA: Diagnosis not present

## 2020-06-15 DIAGNOSIS — R1013 Epigastric pain: Secondary | ICD-10-CM

## 2020-06-15 DIAGNOSIS — K2980 Duodenitis without bleeding: Secondary | ICD-10-CM

## 2020-06-15 DIAGNOSIS — Z800 Family history of malignant neoplasm of digestive organs: Secondary | ICD-10-CM

## 2020-06-15 DIAGNOSIS — D128 Benign neoplasm of rectum: Secondary | ICD-10-CM

## 2020-06-15 MED ORDER — SODIUM CHLORIDE 0.9 % IV SOLN: 500.0000 mL | Freq: Once | INTRAVENOUS | Status: DC

## 2020-06-15 MED ORDER — ONDANSETRON HCL 4 MG PO TABS: 4.0000 mg | ORAL_TABLET | Freq: Four times a day (QID) | ORAL | 1 refills | Status: DC

## 2020-06-15 NOTE — Op Note (Signed)
[facility] Patient Name: Bailey Hooper Procedure Date: 06/15/2020 3:53 PM MRN: [id number] Endoscopist: Gerrit Heck , MD Age: 55 Referring MD:  Date of Birth: 10/31/1965 Gender: Female Account #: [id number] Procedure:                Colonoscopy Indications:              Colon cancer screening in patient at increased                            risk: Colorectal cancer in father                           Additionally, patient with a personal history of                            hyperplastic polyps on last colonoscopy in 2018,                            with recommendation to repeat in 5 years due to                            family history.                           Separately, recent change in bowel habits,                            including non-bloody diarrhea Medicines:                Monitored Anesthesia Care Procedure:                Pre-Anesthesia Assessment:                           - Prior to the procedure, a History and Physical                            was performed, and patient medications and                            allergies were reviewed. The patient's tolerance of                            previous anesthesia was also reviewed. The risks                            and benefits of the procedure and the sedation                            options and risks were discussed with the patient.                            All questions were answered, and informed consent  was obtained. Prior Anticoagulants: The patient has                            taken no previous anticoagulant or antiplatelet                            agents. ASA Grade Assessment: II - A patient with                            mild systemic disease. After reviewing the risks                            and benefits, the patient was deemed in                            satisfactory condition to undergo the procedure.                           After obtaining  informed consent, the colonoscope                            was passed under direct vision. Throughout the                            procedure, the patient's blood pressure, pulse, and                            oxygen saturations were monitored continuously. The                            Olympus CF-HQ190 [id number]) Colonoscope was                            introduced through the anus and advanced to the the                            cecum, identified by appendiceal orifice and                            ileocecal valve. The colonoscopy was technically                            difficult and complex due to suboptimal bowel prep.                            The patient tolerated the procedure well. The                            quality of the bowel preparation was fair. The                            ileocecal valve, appendiceal orifice, and rectum  were photographed. Scope In: 4:15:42 PM Scope Out: 4:33:56 PM Scope Withdrawal Time: 0 hours 15 minutes 16 seconds  Total Procedure Duration: 0 hours 18 minutes 14 seconds  Findings:                 The perianal and digital rectal examinations were                            normal.                           Three sessile polyps were found in the sigmoid                            colon. The polyps were 3 to 4 mm in size. These                            polyps were removed with a cold snare. Resection                            and retrieval were complete. Estimated blood loss                            was minimal.                           Multiple sessile polyps were found in the rectum                            and recto-sigmoid colon. The polyps were 1 to 3 mm                            in size. Several of these polyps were removed with                            a cold biopsy forceps for histologic representative                            evaluation. Resection and retrieval were complete.                             Estimated blood loss was minimal.                           A moderate amount of semi-liquid stool was found in                            the entire colon, interfering with visualization.                            Lavage of the area was performed using copious                            amounts of tap water, resulting in clearance with  fair visualization.                           The visualized mucosa was otherwise normal                            appearing. Biopsies for histology were taken with a                            cold forceps from the right colon and left colon                            for evaluation of microscopic colitis. Estimated                            blood loss was minimal.                           A few small-mouthed diverticula were found in the                            ascending colon.                           Non-bleeding internal hemorrhoids were found during                            retroflexion. The hemorrhoids were small. Complications:            No immediate complications. Estimated Blood Loss:     Estimated blood loss was minimal. Impression:               - Preparation of the colon was fair.                           - Three 3 to 4 mm polyps in the sigmoid colon,                            removed with a cold snare. Resected and retrieved.                           - Multiple 1 to 3 mm polyps in the rectum and at                            the recto-sigmoid colon, removed with a cold biopsy                            forceps. Resected and retrieved.                           - Stool in the entire examined colon.                           - Normal mucosa in the entire examined colon.  Biopsied.                           - Diverticulosis in the ascending colon.                           - Non-bleeding internal hemorrhoids. Recommendation:           - Patient has a contact number  available for                            emergencies. The signs and symptoms of potential                            delayed complications were discussed with the                            patient. Return to normal activities tomorrow.                            Written discharge instructions were provided to the                            patient.                           - Resume previous diet.                           - Continue present medications.                           - Await pathology results.                           - Repeat colonoscopy in 6-12 months because the                            bowel preparation was suboptimal and family history                            of colon cancer.                           - Recommend extended 2-day bowel prep for next                            colonoscopy.                           - Return to GI clinic at appointment to be                            scheduled.                           - Internal hemorrhoids were noted on this study and  may be amenable to hemorrhoid band ligation. If you                            are interested in further treatment of these                            hemorrhoids with band ligation, please contact my                            clinic to set up an appointment for evaluation and                            treatment. Gerrit Heck, MD 06/15/2020 4:42:51 PM

## 2020-06-15 NOTE — Op Note (Signed)
[facility] Patient Name: Bailey Hooper Procedure Date: 06/15/2020 3:55 PM MRN: [id number] Endoscopist: Gerrit Heck , MD Age: 55 Referring MD:  Date of Birth: 1965-04-11 Gender: Female Account #: [id number] Procedure:                Upper GI endoscopy Indications:              Epigastric abdominal pain, Dysphagia, Esophageal                            reflux, Chest pain (non cardiac), Diarrhea Medicines:                Monitored Anesthesia Care Procedure:                Pre-Anesthesia Assessment:                           - Prior to the procedure, a History and Physical                            was performed, and patient medications and                            allergies were reviewed. The patient's tolerance of                            previous anesthesia was also reviewed. The risks                            and benefits of the procedure and the sedation                            options and risks were discussed with the patient.                            All questions were answered, and informed consent                            was obtained. Prior Anticoagulants: The patient has                            taken no previous anticoagulant or antiplatelet                            agents. ASA Grade Assessment: II - A patient with                            mild systemic disease. After reviewing the risks                            and benefits, the patient was deemed in                            satisfactory condition to undergo the procedure.                           -  Prior to the procedure, a History and Physical                            was performed, and patient medications and                            allergies were reviewed. The patient's tolerance of                            previous anesthesia was also reviewed. The risks                            and benefits of the procedure and the sedation                            options and risks were  discussed with the patient.                            All questions were answered, and informed consent                            was obtained. Prior Anticoagulants: The patient has                            taken no previous anticoagulant or antiplatelet                            agents. ASA Grade Assessment: II - A patient with                            mild systemic disease. After reviewing the risks                            and benefits, the patient was deemed in                            satisfactory condition to undergo the procedure.                           After obtaining informed consent, the endoscope was                            passed under direct vision. Throughout the                            procedure, the patient's blood pressure, pulse, and                            oxygen saturations were monitored continuously. The                            Endoscope was introduced through the mouth, and  advanced to the second part of duodenum. The upper                            GI endoscopy was accomplished without difficulty.                            The patient tolerated the procedure well. Scope In: Scope Out: Findings:                 The examined esophagus was normal. The scope was                            withdrawn. Dilation was performed with a Maloney                            dilator with mild resistance at 18 Fr. The dilation                            site was examined following endoscope reinsertion                            and showed mild mucosal disruption at 16 cm from                            the incisors, consistent with successful dilation                            of a subtle proximal stricture. Estimated blood                            loss was minimal.                           The Z-line was regular and was found 40 cm from the                            incisors.                           The entire examined  stomach was normal. Biopsies                            were taken with a cold forceps for Helicobacter                            pylori testing. Estimated blood loss was minimal.                           The examined duodenum was normal. Biopsies for                            histology were taken with a cold forceps for  evaluation of celiac disease. Estimated blood loss                            was minimal. Complications:            No immediate complications. Estimated Blood Loss:     Estimated blood loss was minimal. Impression:               - Normal esophagus. Dilated.                           - Z-line regular, 40 cm from the incisors.                           - Normal stomach. Biopsied.                           - Normal examined duodenum. Biopsied. Recommendation:           - Patient has a contact number available for                            emergencies. The signs and symptoms of potential                            delayed complications were discussed with the                            patient. Return to normal activities tomorrow.                            Written discharge instructions were provided to the                            patient.                           - Soft diet today, then advance to previous diet                            tomorrow as tolerated.                           - Continue present medications.                           - Await pathology results. Gerrit Heck, MD 06/15/2020 4:46:44 PM

## 2020-06-15 NOTE — Progress Notes (Signed)
Called to room to assist during endoscopic procedure.  Patient ID and intended procedure confirmed with present staff. Received instructions for my participation in the procedure from the performing physician.  

## 2020-06-15 NOTE — Progress Notes (Signed)
Pt's states no medical or surgical changes since previsit or office visit.  [place] - vitals 

## 2020-06-15 NOTE — Patient Instructions (Signed)
YOU HAD AN ENDOSCOPIC PROCEDURE TODAY AT THE [facility]:   Refer to the procedure report that was given to you for any specific questions about what was found during the examination.  If the procedure report does not answer your questions, please call your gastroenterologist to clarify.  If you requested that your care partner not be given the details of your procedure findings, then the procedure report has been included in a sealed envelope for you to review at your convenience later.  YOU SHOULD EXPECT: Some feelings of bloating in the abdomen. Passage of more gas than usual.  Walking can help get rid of the air that was put into your GI tract during the procedure and reduce the bloating. If you had a lower endoscopy (such as a colonoscopy or flexible sigmoidoscopy) you may notice spotting of blood in your stool or on the toilet paper. If you underwent a bowel prep for your procedure, you may not have a normal bowel movement for a few days.  Please Note:  You might notice some irritation and congestion in your nose or some drainage.  This is from the oxygen used during your procedure.  There is no need for concern and it should clear up in a day or so.  SYMPTOMS TO REPORT IMMEDIATELY:   Following lower endoscopy (colonoscopy or flexible sigmoidoscopy):  Excessive amounts of blood in the stool  Significant tenderness or worsening of abdominal pains  Swelling of the abdomen that is new, acute  Fever of 100F or higher   Following upper endoscopy (EGD)  Vomiting of blood or coffee ground material  New chest pain or pain under the shoulder blades  Painful or persistently difficult swallowing  New shortness of breath  Fever of 100F or higher  Black, tarry-looking stools  For urgent or emergent issues, a gastroenterologist can be reached at any hour by calling [phone]. Do not use MyChart messaging for urgent concerns.    DIET:  We do recommend a small meal at first, but  then you may proceed to your regular diet.  Drink plenty of fluids but you should avoid alcoholic beverages for 24 hours.  ACTIVITY:  You should plan to take it easy for the rest of today and you should NOT DRIVE or use heavy machinery until tomorrow (because of the sedation medicines used during the test).    FOLLOW UP: Our staff will call the number listed on your records 48-72 hours following your procedure to check on you and address any questions or concerns that you may have regarding the information given to you following your procedure. If we do not reach you, we will leave a message.  We will attempt to reach you two times.  During this call, we will ask if you have developed any symptoms of COVID 19. If you develop any symptoms (ie: fever, flu-like symptoms, shortness of breath, cough etc.) before then, please call [phone].  If you test positive for Covid 19 in the 2 weeks post procedure, please call and report this information to us.    If any biopsies were taken you will be contacted by phone or by letter within the next 1-3 weeks.  Please call us at [phone] if you have not heard about the biopsies in 3 weeks.    SIGNATURES/CONFIDENTIALITY: You and/or your care partner have signed paperwork which will be entered into your electronic medical record.  These signatures attest to the fact that that the information above on   your After Visit Summary has been reviewed and is understood.  Full responsibility of the confidentiality of this discharge information lies with you and/or your care-partner. 

## 2020-06-15 NOTE — Progress Notes (Unsigned)
Report given to PACU, vss 

## 2020-06-15 NOTE — Progress Notes (Unsigned)
1610 HR > 100 with esmolol 25 mg given IV, MD updated, vss 

## 2020-06-15 NOTE — Progress Notes (Signed)
1558 Robinul 0.1 mg IV given due large amount of secretions upon assessment.  MD made aware, vss  

## 2020-06-16 ENCOUNTER — Ambulatory Visit (HOSPITAL_COMMUNITY): Payer: 59 | Admitting: Physical Therapy

## 2020-06-16 ENCOUNTER — Encounter: Payer: Self-pay | Admitting: Family Medicine

## 2020-06-16 DIAGNOSIS — Z9889 Other specified postprocedural states: Secondary | ICD-10-CM | POA: Insufficient documentation

## 2020-06-17 ENCOUNTER — Telehealth: Payer: Self-pay

## 2020-06-17 ENCOUNTER — Other Ambulatory Visit: Payer: Self-pay | Admitting: Family Medicine

## 2020-06-17 DIAGNOSIS — Z1231 Encounter for screening mammogram for malignant neoplasm of breast: Secondary | ICD-10-CM

## 2020-06-17 NOTE — Telephone Encounter (Signed)
  Follow up Call-  Call back number 06/15/2020  Post procedure Call Back phone  # [phone]  Permission to leave phone message Yes  Some recent data might be hidden     Patient questions:  Do you have a fever, pain , or abdominal swelling? No. Pain Score  0 *  Have you tolerated food without any problems? Yes.    Have you been able to return to your normal activities? Yes.    Do you have any questions about your discharge instructions: Diet   No. Medications  No. Follow up visit  No.  Do you have questions or concerns about your Care? No.  Actions: * If pain score is 4 or above: No action needed, pain <4. 1. Have you developed a fever since your procedure? nop  2.   Have you had an respiratory symptoms (SOB or cough) since your procedure? no  3.   Have you tested positive for COVID 19 since your procedure no  4.   Have you had any family members/close contacts diagnosed with the COVID 19 since your procedure?  no   If yes to any of these questions please route to Joylene John, RN and Joella Prince, RN

## 2020-06-24 ENCOUNTER — Encounter: Payer: Self-pay | Admitting: Gastroenterology

## 2020-06-29 ENCOUNTER — Ambulatory Visit (HOSPITAL_COMMUNITY): Payer: 59 | Attending: Physical Medicine and Rehabilitation

## 2020-07-12 ENCOUNTER — Telehealth: Payer: Self-pay | Admitting: Family Medicine

## 2020-07-12 DIAGNOSIS — J0111 Acute recurrent frontal sinusitis: Secondary | ICD-10-CM

## 2020-07-12 DIAGNOSIS — R1011 Right upper quadrant pain: Secondary | ICD-10-CM

## 2020-07-12 MED ORDER — AMOXICILLIN-POT CLAVULANATE 875-125 MG PO TABS: 1.0000 | ORAL_TABLET | Freq: Two times a day (BID) | ORAL | 0 refills | Status: DC

## 2020-07-12 NOTE — Telephone Encounter (Signed)
Please schedule with another provider if able and amenable.   Thanks! Dorris Singh, MD  Family Medicine Teaching Service

## 2020-07-12 NOTE — Telephone Encounter (Signed)
Called patient and scheduled a Virtual Visit because she has conflicting schedules for office visits.  Patient states that she does not feel good and doesn't want to come in on a later date.  Patient wants Dr. Owens Shark to call her and coordinate with Dr. Nita Sells for her virtual visit 07/13/2020 at 1015.  Patient is very upset and frustrated" because she has been to ED and Urgent care and is told to come to [facility] to see PCP.  Patient has issues with sinus infection?She wants a medication called in to pharmacy.  This has nothing to do with ongoing digestive issues."  Will forward to PCP for further resolution.  Bailey Hooper, [place]

## 2020-07-12 NOTE — Telephone Encounter (Signed)
Called patient to discuss multiple concerns.  Discussed stomach symptoms. Reports history of intolerance to Prozac---has already begun self-taper of fluoxetine, suspect this may be contributing. Patient was to have RUQ ultrasound in fall 2020, cancelled by patient. Ordered, gave patient number to call at [facility].  Patient reports sinus symptoms X2 weeks with green drainage, pressure, pain, cough. Discussed. Allergy to penicillin, only rash, has tolerated Augmentin in past, Rx for 7 days. Discussed adverse events and side effects.  All questions answered.  Dorris Singh, MD  Family Medicine Teaching Service

## 2020-07-12 NOTE — Telephone Encounter (Signed)
Patient called to set up appointment, got really upset when I told her the next available, I scheduled her for 08/05/2020 also added her to the cancellation list, She wanted me to ask if doctor wanted her to wait that long or if she wanted to talk to her sooner, or see another doctor. Please advise. Thanks!

## 2020-07-13 ENCOUNTER — Ambulatory Visit (HOSPITAL_COMMUNITY): Payer: 59 | Attending: Physical Medicine and Rehabilitation

## 2020-07-13 ENCOUNTER — Telehealth: Payer: 59 | Admitting: Family Medicine

## 2020-07-13 ENCOUNTER — Encounter (HOSPITAL_COMMUNITY): Payer: Self-pay

## 2020-07-13 ENCOUNTER — Other Ambulatory Visit: Payer: Self-pay

## 2020-07-13 DIAGNOSIS — M545 Low back pain, unspecified: Secondary | ICD-10-CM | POA: Insufficient documentation

## 2020-07-13 NOTE — Therapy (Signed)
[place], [place], [address] Phone: [phone]   Fax:  [phone]  Physical Therapy Evaluation  Patient Details  Name: Bailey Hooper MRN: [id number] Date of Birth: [birth year] Referring Provider (PT): Normajean Glasgow, MD   Encounter Date: 07/13/2020   PT End of Session - 07/13/20 1436    Visit Number 1    Number of Visits 1    Authorization Type [employer]    PT Start Time 1275    PT Stop Time 1507    PT Time Calculation (min) 30 min    Activity Tolerance Patient tolerated treatment well    Behavior During Therapy [facility] for tasks assessed/performed           Past Medical History:  Diagnosis Date  . Allergy   . Anxiety   . Arthritis   . Asthma   . Depression   . Fibroids   . GERD (gastroesophageal reflux disease)   . Hiatal hernia   . High cholesterol   . Hypertension   . IBS (irritable bowel syndrome)   . Migraines   . Prediabetes   . Sleep apnea    cpap  . Tobacco use   . Vertigo     Past Surgical History:  Procedure Laterality Date  . CESAREAN SECTION    . COLONOSCOPY    . FOOT SURGERY     Left foot    There were no vitals filed for this visit.    Subjective Assessment - 07/13/20 1438    Subjective Pt notes tingling/burning in feet/legs and does not note any common caustive factors.  Pt reports this occurrence has been sporadic and can occur at night, during the day, at rest or with activity. Pt notes back discomfort that is "on and off" but she reports she has been very active with her grandchildren which she feels may have caused. Pt complaints are rather diffuse and non-specific and notes most of her discomfort began after having COVID infection in December    How long can you sit comfortably? no issues    How long can you stand comfortably? no issues    How long can you walk comfortably? no issues    Patient Stated Goals "Get myself checked out to make sure everything is ok"    Currently  in Pain? No/denies    Pain Score 0-No pain              [facility] PT Assessment - 07/13/20 0001      Assessment   Medical Diagnosis lumbar radiculoptahy/lumbar pain    Referring Provider (PT) Normajean Glasgow, MD      Balance Screen   Has the patient fallen in the past 6 months No    Has the patient had a decrease in activity level because of a fear of falling?  No    Is the patient reluctant to leave their home because of a fear of falling?  No      ROM / Strength   AROM / PROM / Strength Strength;AROM      AROM   AROM Assessment Site Lumbar    Lumbar Flexion WNL    Lumbar Extension WNL    Lumbar - Right Side Bend WNL    Lumbar - Left Side Bend WNL    Lumbar - Right Rotation WNL    Lumbar - Left Rotation WNL      Strength   Strength Assessment Site Lumbar    Lumbar  Flexion 3/5    Lumbar Extension 3/5      Flexibility   Soft Tissue Assessment /Muscle Length yes    Hamstrings WNL    Quadriceps WNL, tightness present      Palpation   Spinal mobility unrevealing    SI assessment  unrevealing    Palpation comment no tenderness to palpation      Special Tests    Special Tests Lumbar    Lumbar Tests FABER test;Slump Test;Straight Leg Raise      FABER test   findings Negative      Slump test   Findings Negative      Straight Leg Raise   Findings Negative      Ambulation/Gait   Ambulation/Gait Yes    Ambulation/Gait Assistance 7: Independent    Ambulation Distance (Feet) 250 Feet    Assistive device None    Gait Pattern Within Functional Limits                      Objective measurements completed on examination: See above findings.               PT Education - 07/13/20 1513    Education Details pt educated on importance of regular physical activity to improve general health and advised to pursure general strength and cardiovascular exercise at fitness facility    Person(s) Educated Patient    Methods Explanation    Comprehension  Verbalized understanding            PT Short Term Goals - 07/13/20 1517      PT SHORT TERM GOAL #1   Title Patient will demonstrate understanding of implementing regular physical activity to improve general health    Time 1    Period Days    Status Achieved    Target Date 07/13/20                     Plan - 07/13/20 1514    Clinical Impression Statement Patient does not present with any functional or physical limitations at this time and denies any pain other than noting her hands sometimes ache and draw up in the AM when she awakes.  No onset of pain with provocative tests or repeated motions.  Exhibits some trunk weakness which is related to general inactivity and sedentary lifestyle. Therapist advised patient to continue with general fitness program to improve well-being.    Personal Factors and Comorbidities Time since onset of injury/illness/exacerbation    Stability/Clinical Decision Making Stable/Uncomplicated    Clinical Decision Making Low    Rehab Potential Good    PT Frequency One time visit    PT Treatment/Interventions Patient/family education    PT Next Visit Plan N/A at this time    PT Home Exercise Plan general fitness activities recommended, e.g. compound movement strength training and cardiovascular endurance exercise    Consulted and Agree with Plan of Care Patient           Patient will benefit from skilled therapeutic intervention in order to improve the following deficits and impairments:  Improper body mechanics  Visit Diagnosis: Midline low back pain without sciatica, unspecified chronicity     Problem List Patient Active Problem List   Diagnosis Date Noted  . H/O colonoscopy 06/16/2020  . Hot flashes 04/27/2019  . Bipolar disorder ([place]) 01/18/2019  . Asthma 01/18/2019  . Hyperlipidemia 07/14/2018  . Tobacco abuse 11/29/2017  . Benign paroxysmal positional vertigo 11/29/2017  .  Major depressive disorder, recurrent episode, moderate  ([place]) 06/28/2009  . GENITAL HERPES 01/17/2009  . Migraine headache 01/17/2009  . Essential hypertension, benign 01/17/2009  . GERD (gastroesophageal reflux disease) 01/17/2009  . Irritable bowel syndrome 01/17/2009  . Heart murmur 01/17/2009   3:22 PM, 07/13/20 M. Sherlyn Lees, PT, DPT Physical Therapist- [place] Office Number: [phone]  [place] [address] [place], [place], [address] Phone: [phone]   Fax:  [phone]  Name: Bailey Hooper MRN: [id number] Date of Birth: 11/02/1965

## 2020-07-21 ENCOUNTER — Ambulatory Visit (HOSPITAL_COMMUNITY): Payer: 59

## 2020-08-03 ENCOUNTER — Ambulatory Visit (HOSPITAL_COMMUNITY)
Admission: RE | Admit: 2020-08-03 | Discharge: 2020-08-03 | Disposition: A | Discharge status: Home / Self Care | Payer: 59 | Source: Ambulatory Visit | Attending: Family Medicine | Admitting: Family Medicine

## 2020-08-03 DIAGNOSIS — R1011 Right upper quadrant pain: Secondary | ICD-10-CM | POA: Diagnosis not present

## 2020-08-05 ENCOUNTER — Telehealth (INDEPENDENT_AMBULATORY_CARE_PROVIDER_SITE_OTHER): Payer: 59 | Admitting: Family Medicine

## 2020-08-05 ENCOUNTER — Encounter: Payer: Self-pay | Admitting: Family Medicine

## 2020-08-05 ENCOUNTER — Other Ambulatory Visit: Payer: Self-pay

## 2020-08-05 ENCOUNTER — Telehealth: Payer: Self-pay | Admitting: Family Medicine

## 2020-08-05 DIAGNOSIS — F331 Major depressive disorder, recurrent, moderate: Secondary | ICD-10-CM | POA: Diagnosis not present

## 2020-08-05 DIAGNOSIS — K589 Irritable bowel syndrome without diarrhea: Secondary | ICD-10-CM

## 2020-08-05 DIAGNOSIS — R232 Flushing: Secondary | ICD-10-CM

## 2020-08-05 DIAGNOSIS — K760 Fatty (change of) liver, not elsewhere classified: Secondary | ICD-10-CM | POA: Diagnosis not present

## 2020-08-05 MED ORDER — BLACK COHOSH 40 MG PO CAPS: ORAL_CAPSULE | ORAL | 0 refills | Status: DC

## 2020-08-05 NOTE — Assessment & Plan Note (Signed)
Given interactions with cyproheptadine with medications, will advise patient that would not routinely recommend.

## 2020-08-05 NOTE — Assessment & Plan Note (Signed)
Suspect cause of imaging findings.  - Denies EtOH use - Needs full in office assessment with labs at follow up with Dr. Alba Cory-- Hepatitis B (full serologies for immunity), ferritin, hepatic panel, lipids, and BMP (for glucose, or A1C0  - Has had negative hepatitis C - No medications identified as culprit  - Will discuss weight loss, starting at statin pending findings at follow up

## 2020-08-05 NOTE — Telephone Encounter (Signed)
Called patient. Discussed---will not refill this medication given other medications/lack of indication. Voiced understanding, all questions answered.  Dorris Singh, MD  Family Medicine Teaching Service

## 2020-08-05 NOTE — Progress Notes (Signed)
[place] Telemedicine Visit  Patient consented to have virtual visit and was identified by name and date of birth. Method of visit: Video  Encounter participants: Patient: Bailey Hooper - located at home Provider: Martyn Malay - located at [facility] Others (if applicable): none   Chief Complaint: check up and discuss liver results   HPI:  Bailey Hooper is a very pleasant 55 year old woman with history of   Mood  The patient reports her mood is okay. She is interested in speaking to chronic care management---has tried multiple places for mental health providers and has been unable to find one. Reports mood is 'okay'. Denies SI/HI.   Stomach symptoms Patient recently had abdominal ultrasound completed. This demonstrated findings below (suggestive of MAFLD--fatty liver). She would like to consider restartin cyproheptadine. Denies vomiting, weight loss, change in bowels. She does not use alcohol. She is trying to lose weight.   Hot Flashes Patient wonders if a black cohosh supplement is okay to take. Endorses improvement in hot flashes with this.   ROS: per HPI  Pertinent PMHx: Reviewed and updated   Exam:  Speaking in full sentences, well appearing   Assessment/Plan:  Hot flashes Reviewed supplements--appears safe with current regimen.   NAFLD (nonalcoholic fatty liver disease) Suspect cause of imaging findings.  - Denies EtOH use - Needs full in office assessment with labs at follow up with Dr. Alba Cory-- Hepatitis B (full serologies for immunity), ferritin, hepatic panel, lipids, and BMP (for glucose, or A1C0  - Has had negative hepatitis C - No medications identified as culprit  - Will discuss weight loss, starting at statin pending findings at follow up    Irritable bowel syndrome Given interactions with cyproheptadine with medications, will advise patient that would not routinely recommend.    Mood Disorder Given difficulty with  establishing with therapy- referred to CCM to help establish with therapist (has tried multiple providers)   Patient amenable   HCM  Discussed and declined COVID vaccine   Time spent during visit with patient: 17  minutes

## 2020-08-05 NOTE — Assessment & Plan Note (Signed)
Reviewed supplements--appears safe with current regimen.

## 2020-08-08 ENCOUNTER — Telehealth: Payer: Self-pay | Admitting: *Deleted

## 2020-08-08 NOTE — Chronic Care Management (AMB) (Signed)
  Care Management   Note  08/08/2020 Name: Adeliz Tonkinson MRN: [id number] DOB: 09-May-1965  Christelle Igoe is a 55 y.o. year old female who is a primary care patient of Martyn Malay, MD. I reached out to [employer] by phone today in response to a referral sent by Ms. Ivin Booty Wronski's PCP, Martyn Malay, MD.    Ms. Winterrowd was given information about care management services today including:  1. Care management services include personalized support from designated clinical staff supervised by her physician, including individualized plan of care and coordination with other care providers 2. 24/7 contact phone numbers for assistance for urgent and routine care needs. 3. The patient may stop care management services at any time by phone call to the office staff.  Patient agreed to services and verbal consent obtained.   Follow up plan: Telephone appointment with care management team member scheduled for:08/12/2020  [place] [facility]

## 2020-08-10 ENCOUNTER — Ambulatory Visit
Admission: RE | Admit: 2020-08-10 | Discharge: 2020-08-10 | Disposition: A | Discharge status: Home / Self Care | Payer: 59 | Source: Ambulatory Visit | Attending: Family Medicine | Admitting: Family Medicine

## 2020-08-10 ENCOUNTER — Other Ambulatory Visit: Payer: Self-pay

## 2020-08-10 DIAGNOSIS — Z1231 Encounter for screening mammogram for malignant neoplasm of breast: Secondary | ICD-10-CM

## 2020-08-12 ENCOUNTER — Ambulatory Visit: Payer: 59 | Admitting: Licensed Clinical Social Worker

## 2020-08-12 DIAGNOSIS — F331 Major depressive disorder, recurrent, moderate: Secondary | ICD-10-CM

## 2020-08-12 DIAGNOSIS — F419 Anxiety disorder, unspecified: Secondary | ICD-10-CM

## 2020-08-12 NOTE — Patient Instructions (Signed)
Licensed Clinical Social Worker Visit Information  Goals we discussed today:  Goals Addressed            This Visit's Progress   . Begin and Stick with Counseling-Depression       Timeframe:  Short-Term Goal Priority:  High Start Date:   08/12/2020                          Expected End Date:                       Follow Up Date 08/19/2020    Patient Goals/Self-Care Activities: Over the next 10 days .  Select one of the agencies from the list provided and call to schedule an appointment  .  Review EMMI educational information (breathing to relax, and Movement: emotional health) . Continue with your walking and relaxed breathing 3 times a day . I have mailed the Financial Assistance application. . I will coordinate with your doctor to assist with referral for psychiatry    Why is this important?    Beating depression may take some time.   If you don't feel better right away, don't give up on your treatment plan.        Materials provided: Yes: EMMI educational video  Ms. Mcelreath was given information about Care Management services today including:  1. Care Management services include personalized support from designated clinical staff supervised by her physician, including individualized plan of care and coordination with other care providers 2. 24/7 contact phone numbers for assistance for urgent and routine care needs. 3. The patient may stop Care Management services at any time by phone call to the office staff. Patient agreed to services and verbal consent obtained.  Patient verbalizes understanding of instructions provided today and agrees to view in [place].   Follow up plan: SW will follow up with patient by phone over the next 08/19/20  Casimer Lanius, [place] [facility]  [phone]

## 2020-08-12 NOTE — Chronic Care Management (AMB) (Signed)
Care Management Clinical Social Work Note  08/12/2020 Name: Bailey Hooper MRN: [id number] DOB: 1965-05-04  Bailey Hooper is a 55 y.o. year old female who is a primary care patient of Martyn Malay, MD.  The Care Management team was consulted for assistance with chronic disease management and coordination needs.  Engaged with patient by telephone for initial visit in response to provider referral for social work chronic care management and care coordination services  Consent to Services:  [name] was given information about Care Management services today including:  1. Care Management services includes personalized support from designated clinical staff supervised by her physician, including individualized plan of care and coordination with other care providers 2. 24/7 contact phone numbers for assistance for urgent and routine care needs. 3. The patient may stop case management services at any time by phone call to the office staff.  Patient agreed to services and consent obtained.   Assessment: Patient is currently experiencing difficulty with managing symptoms of anxiety. Reports having panic and anxiety attacks that last up to 20 min. Has history of anxiety and depression. Reports in and out of counseling over the past 12 years. See Care Plan below for interventions and patient self-care actives.  Recommendation: Patient may benefit from, and is in agreement to reconnect with counseling and medication management. Follow up Plan: Patient would like continued follow-up.  CCM LCSW will follow up with patient in 1 week.. Patient will call office if needed prior to next encounter.   Review of patient past medical history, allergies, medications, and health status, including review of relevant consultants reports was performed today as part of a comprehensive evaluation and provision of chronic care management and care coordination services.  SDOH (Social Determinants of Health) assessments and  interventions performed:  SDOH Interventions   Flowsheet Row Most Recent Value  SDOH Interventions   Stress Interventions Provide Counseling       Advanced Directives Status: Not addressed in this encounter.  Care Plan  Allergies  Allergen Reactions  . Lisinopril Palpitations and Cough    Per pt report  . Lamictal [Lamotrigine] Hives  . Penicillins Rash    Vesicles, denies SOB or breathing issues  . Erythromycin Nausea And Vomiting    Outpatient Encounter Medications as of 08/12/2020  Medication Sig Note  . albuterol (VENTOLIN HFA) 108 (90 Base) MCG/ACT inhaler Inhale 2 puffs into the lungs every 6 (six) hours as needed for wheezing.   . AMBULATORY NON FORMULARY MEDICATION GI Cock tail: 90 ml viscous lidocaine, 90 ml 10/mg/ 5 ml dicyclomine, 270 ml maalox   . amLODipine (NORVASC) 10 MG tablet Take 1 tablet (10 mg total) by mouth daily.   Marland Kitchen amoxicillin-clavulanate (AUGMENTIN) 875-125 MG tablet Take 1 tablet by mouth 2 (two) times daily. (Patient not taking: Reported on 08/05/2020)   . azelastine (OPTIVAR) 0.05 % ophthalmic solution Place 1 drop into both eyes 2 (two) times daily. (Patient not taking: No sig reported)   . Black Cohosh (BLACK COHOSH HOT FLASH RELIEF) 40 MG CAPS Take 1 per day   . cetirizine (ZYRTEC) 10 MG tablet Take 1 tablet (10 mg total) by mouth daily.   Marland Kitchen dicyclomine (BENTYL) 20 MG tablet 1/2 to 1 tab 3 times daily before meals and at bedtime   . fluocinonide cream (LIDEX) 9.48 % Apply 1 application topically 2 (two) times daily. 10/15/2018: Uses as needed for scalp psoriasis  . gabapentin (NEURONTIN) 300 MG capsule Take 1 capsule (300 mg total) by mouth at  bedtime.   . hydrOXYzine (ATARAX/VISTARIL) 25 MG tablet Take 1/2 to one tab daily for anxiety and sleep   . ipratropium (ATROVENT) 0.03 % nasal spray Place 2 sprays into both nostrils every 12 (twelve) hours.   Marland Kitchen ketoconazole (NIZORAL) 2 % cream Apply 1 application topically daily.    . lansoprazole (PREVACID) 30  MG capsule Take 1 capsule (30 mg total) by mouth daily at 12 noon.   . Melatonin 1 MG CAPS Take by mouth. 10/15/2018: As needed  . metoprolol succinate (TOPROL-XL) 25 MG 24 hr tablet Take 1 tablet (25 mg total) by mouth at bedtime. Take with or immediately following a meal.   . mometasone (NASONEX) 50 MCG/ACT nasal spray 2 sprays each nostril daily   . montelukast (SINGULAIR) 10 MG tablet Take 1 tablet (10 mg total) by mouth at bedtime.   . Multiple Minerals-Vitamins (CAL MAG ZINC +D3) TABS Take 1 tablet by mouth daily.   . Multiple Vitamin (MULTIVITAMIN) capsule Take 1 capsule by mouth daily.   . ondansetron (ZOFRAN) 4 MG tablet Take 1 tablet (4 mg total) by mouth every 6 (six) hours.   . SUMAtriptan (IMITREX) 50 MG tablet May repeat in 2 hours if headache persists or recurs with max of 100 mg in 24 hours.   . traZODone (DESYREL) 50 MG tablet Take 0.5-1 tablets (25-50 mg total) by mouth at bedtime as needed for sleep.   . [DISCONTINUED] loratadine (CLARITIN) 10 MG tablet Take 1 tablet (10 mg total) by mouth daily. (Patient not taking: Reported on 11/19/2018)    No facility-administered encounter medications on file as of 08/12/2020.    Patient Active Problem List   Diagnosis Date Noted  . NAFLD (nonalcoholic fatty liver disease) 08/05/2020  . H/O colonoscopy 06/16/2020  . Hot flashes 04/27/2019  . Bipolar disorder ([place]) 01/18/2019  . Asthma 01/18/2019  . Hyperlipidemia 07/14/2018  . Tobacco abuse 11/29/2017  . Benign paroxysmal positional vertigo 11/29/2017  . Major depressive disorder, recurrent episode, moderate ([place]) 06/28/2009  . GENITAL HERPES 01/17/2009  . Migraine headache 01/17/2009  . Essential hypertension, benign 01/17/2009  . GERD (gastroesophageal reflux disease) 01/17/2009  . Irritable bowel syndrome 01/17/2009  . Heart murmur 01/17/2009    Conditions to be addressed/monitored: Anxiety and Depression; Mental Health Concerns   Care Plan : General Social Work (Adult)   Updates made by Maurine Cane, LCSW since 08/12/2020 12:00 AM  Problem: symptoms of Anxiety and Depression unmanaged   Goal: connect for medication managment and counseling   Start Date: 08/12/2020  This Visit's Progress: On track  Priority: High  Current barriers:   . Chronic Mental Health needs related to managing symptoms of anxiety and depression  . Unable to locate a mental health provider that take her insurance   Needs Support, Education, and Care Coordination in order to meet unmet mental health needs. Clinical Goal(s): Over the next 90 days, patient will work with SW to reduce or manage symptoms of anxiety, depression, and mood instability and increase knowledge and/or ability of: coping skills, self-management skills, and stress reduction.until connected for ongoing counseling.  Clinical Interventions:  . Assessed patient's previous treatment, needs, coping skills, current treatment, support system and barriers to care  . Other interventions: Solution-Focused Strategies, Mindfulness or Relaxation Training, Active listening / Reflection utilized , Emotional Supportive Provided, Behavioral Activation, Problem [place] , and Provided EMMI education information on breathing to relax,  and Movement: emotional health  . Various insurance options Passenger transport manager): mailed  application. . Advised patient to [employer] provider for a list of counselors . Collaborated with primary care provider re: LCSW placed referral to [facility]* . Discussed several options for long term counseling based on need and insurance.  . Will assisted patient with narrowing options (this will require LCSW to make several calls) . Collaboration with PCP regarding development and update of comprehensive plan of care as evidenced by provider attestation and co-signature . Inter-disciplinary care team collaboration (see longitudinal plan of care) Patient Goals/Self-Care  Activities: Over the next 10 days .  Select one of the agencies from the list provided and call to schedule an appointment  .  Review EMMI educational information (breathing to relax, and Movement: emotional health) . Continue with your walking and relaxed breathing 3 times a day . I have mailed the Financial Assistance application. . I will coordinate with your doctor to assist with referral for psychiatry      Casimer Lanius, [place] / [place]   [phone] 4:59 PM

## 2020-08-19 ENCOUNTER — Ambulatory Visit (INDEPENDENT_AMBULATORY_CARE_PROVIDER_SITE_OTHER): Payer: 59 | Admitting: Obstetrics & Gynecology

## 2020-08-19 ENCOUNTER — Ambulatory Visit: Payer: 59 | Admitting: Licensed Clinical Social Worker

## 2020-08-19 ENCOUNTER — Other Ambulatory Visit: Payer: Self-pay

## 2020-08-19 ENCOUNTER — Other Ambulatory Visit (HOSPITAL_COMMUNITY)
Admission: RE | Admit: 2020-08-19 | Discharge: 2020-08-19 | Disposition: A | Discharge status: Home / Self Care | Payer: 59 | Source: Ambulatory Visit | Attending: Obstetrics & Gynecology | Admitting: Obstetrics & Gynecology

## 2020-08-19 ENCOUNTER — Encounter: Payer: Self-pay | Admitting: Obstetrics & Gynecology

## 2020-08-19 VITALS — BP 144/92 | Ht 66.0 in | Wt 181.0 lb

## 2020-08-19 DIAGNOSIS — Z01419 Encounter for gynecological examination (general) (routine) without abnormal findings: Secondary | ICD-10-CM

## 2020-08-19 DIAGNOSIS — Z7189 Other specified counseling: Secondary | ICD-10-CM

## 2020-08-19 DIAGNOSIS — N951 Menopausal and female climacteric states: Secondary | ICD-10-CM | POA: Diagnosis not present

## 2020-08-19 DIAGNOSIS — F331 Major depressive disorder, recurrent, moderate: Secondary | ICD-10-CM

## 2020-08-19 NOTE — Chronic Care Management (AMB) (Signed)
Care Management   Clinical Social Work Note  08/19/2020 Name: Bailey Hooper MRN: [id number] DOB: May 05, 1965  Bailey Hooper is a 55 y.o. year old female who is a primary care patient of Martyn Malay, MD. The CCM team was consulted to assist the patient with chronic disease management and/or care coordination needs related to: Mental Health Counseling and Resources.   Engaged with patient by telephone for follow up visit in response to provider referral for social work chronic care management and care coordination services.   Consent to Services:  The patient was given information about Chronic Care Management services, agreed to services, and gave verbal consent prior to initiation of services.  Please see initial visit note for detailed documentation.   Patient agreed to services and consent obtained.   Assessment: Patient continues to experience difficulty with connecting with mental health providers that take her insurance.Marland Kitchen LCSW able to assist with this barrier. See Care Plan below for interventions and patient self-care actives. Recommendation: Patient may benefit from, and is in agreement to keep appointments.  Follow up Plan: Patient would like continued follow-up.  CCM LCSW will follow up with patient in 1 week. Patient will call office if needed prior to next encounter.   : Review of patient past medical history, allergies, medications, and health status, including review of relevant consultants reports was performed today as part of a comprehensive evaluation and provision of chronic care management and care coordination services.     SDOH (Social Determinants of Health) assessments and interventions performed:    Advanced Directives Status: Not addressed in this encounter.  CCM Care Plan  Allergies  Allergen Reactions  . Lisinopril Palpitations and Cough    Per pt report  . Lamictal [Lamotrigine] Hives  . Penicillins Rash    Vesicles, denies SOB or breathing issues  .  Erythromycin Nausea And Vomiting    Outpatient Encounter Medications as of 08/19/2020  Medication Sig Note  . albuterol (VENTOLIN HFA) 108 (90 Base) MCG/ACT inhaler Inhale 2 puffs into the lungs every 6 (six) hours as needed for wheezing.   . AMBULATORY NON FORMULARY MEDICATION GI Cock tail: 90 ml viscous lidocaine, 90 ml 10/mg/ 5 ml dicyclomine, 270 ml maalox   . amLODipine (NORVASC) 10 MG tablet Take 1 tablet (10 mg total) by mouth daily.   Marland Kitchen amoxicillin-clavulanate (AUGMENTIN) 875-125 MG tablet Take 1 tablet by mouth 2 (two) times daily. (Patient not taking: Reported on 08/05/2020)   . azelastine (OPTIVAR) 0.05 % ophthalmic solution Place 1 drop into both eyes 2 (two) times daily. (Patient not taking: No sig reported)   . Black Cohosh (BLACK COHOSH HOT FLASH RELIEF) 40 MG CAPS Take 1 per day   . cetirizine (ZYRTEC) 10 MG tablet Take 1 tablet (10 mg total) by mouth daily.   Marland Kitchen dicyclomine (BENTYL) 20 MG tablet 1/2 to 1 tab 3 times daily before meals and at bedtime   . fluocinonide cream (LIDEX) 5.78 % Apply 1 application topically 2 (two) times daily. 10/15/2018: Uses as needed for scalp psoriasis  . gabapentin (NEURONTIN) 300 MG capsule Take 1 capsule (300 mg total) by mouth at bedtime.   . hydrOXYzine (ATARAX/VISTARIL) 25 MG tablet Take 1/2 to one tab daily for anxiety and sleep   . ipratropium (ATROVENT) 0.03 % nasal spray Place 2 sprays into both nostrils every 12 (twelve) hours.   Marland Kitchen ketoconazole (NIZORAL) 2 % cream Apply 1 application topically daily.    . lansoprazole (PREVACID) 30 MG capsule Take  1 capsule (30 mg total) by mouth daily at 12 noon.   . Melatonin 1 MG CAPS Take by mouth. 10/15/2018: As needed  . metoprolol succinate (TOPROL-XL) 25 MG 24 hr tablet Take 1 tablet (25 mg total) by mouth at bedtime. Take with or immediately following a meal.   . mometasone (NASONEX) 50 MCG/ACT nasal spray 2 sprays each nostril daily   . montelukast (SINGULAIR) 10 MG tablet Take 1 tablet (10 mg total)  by mouth at bedtime.   . Multiple Minerals-Vitamins (CAL MAG ZINC +D3) TABS Take 1 tablet by mouth daily.   . Multiple Vitamin (MULTIVITAMIN) capsule Take 1 capsule by mouth daily.   . ondansetron (ZOFRAN) 4 MG tablet Take 1 tablet (4 mg total) by mouth every 6 (six) hours.   . SUMAtriptan (IMITREX) 50 MG tablet May repeat in 2 hours if headache persists or recurs with max of 100 mg in 24 hours.   . traZODone (DESYREL) 50 MG tablet Take 0.5-1 tablets (25-50 mg total) by mouth at bedtime as needed for sleep.   . [DISCONTINUED] loratadine (CLARITIN) 10 MG tablet Take 1 tablet (10 mg total) by mouth daily. (Patient not taking: Reported on 11/19/2018)    No facility-administered encounter medications on file as of 08/19/2020.    Patient Active Problem List   Diagnosis Date Noted  . NAFLD (nonalcoholic fatty liver disease) 08/05/2020  . H/O colonoscopy 06/16/2020  . Hot flashes 04/27/2019  . Bipolar disorder ([place]) 01/18/2019  . Asthma 01/18/2019  . Hyperlipidemia 07/14/2018  . Tobacco abuse 11/29/2017  . Benign paroxysmal positional vertigo 11/29/2017  . Major depressive disorder, recurrent episode, moderate ([place]) 06/28/2009  . GENITAL HERPES 01/17/2009  . Migraine headache 01/17/2009  . Essential hypertension, benign 01/17/2009  . GERD (gastroesophageal reflux disease) 01/17/2009  . Irritable bowel syndrome 01/17/2009  . Heart murmur 01/17/2009    Conditions to be addressed/monitored: Depression; Mental Health Concerns   Care Plan : General Social Work (Adult)  Updates made by Maurine Cane, LCSW since 08/19/2020 12:00 AM  Problem: symptoms of Anxiety and Depression unmanaged   Goal: connect for medication managment and counseling   Start Date: 08/12/2020  This Visit's Progress: On track  Recent Progress: On track  Priority: High  Current barriers:   . Chronic Mental Health needs related to managing symptoms of anxiety and depression  . Unable to locate a mental health provider  that take her insurance   Needs Support, Education, and Care Coordination in order to meet unmet mental health needs. Clinical Goal(s): Over the next 90 days, patient will work with SW to reduce or manage symptoms of anxiety, depression, and mood instability and increase knowledge and/or ability of: coping skills, self-management skills, and stress reduction.until connected for ongoing counseling.  Clinical Interventions:  . Assessed patient's treatment, needs, coping skills,support system and barriers to care  . Other interventions: Solution-Focused Strategies, Relaxation Training, Active listening / Reflection utilized , Emotional Supportive Provided, Behavioral Activation, Problem Solving. . Provided EMMI education information on breathing to relax, depression and Movement: emotional health  . Various insurance options Passenger transport manager): mailed application; patient has not received application . Marland Kitchen Patient contact insurance provider for a list of counselors; she call but unable to locate anyone . LCSW f/u on internal referral placed to [facility]; They are not taking new patients for medication management but able to take patient for counseling services. LCSW placed referral to [facility] for counseling only . LCSW made several calls  for medication management options( [facility] and [facility] outpatient not taking new patients) . [facility] [phone] with Patient on the line  medication management appointment scheduled May 31st at 10:00 virtual appointment   . Inter-disciplinary care team collaboration (see longitudinal plan of care) Patient Goals/Self-Care Activities: Over the next 10 days .  Keep appointment  with [facility] [phone] . I have placed the referral with [place] for counseling  . Continue to review EMMI educational information (breathing to relax, depression and Movement: emotional health) . Continue with your walking and  relaxed breathing 3 times a day . I have mailed the Financial Assistance application.     Casimer Lanius, [place] / [place]   [phone] 11:23 AM

## 2020-08-19 NOTE — Patient Instructions (Signed)
Visit Information  Goals Addressed            This Visit's Progress   . Begin and Stick with Counseling-Depression   On track    Timeframe:  Short-Term Goal Priority:  High Start Date:   08/12/2020                          Expected End Date:                       Follow Up Date 08/26/2020    Patient Goals/Self-Care Activities: Over the next 10 days .   Keep appointment  with [facility] [phone] . I have placed the referral with [place] for counseling  . Continue to review EMMI educational information (breathing to relax, depression and Movement: emotional health) . Continue with your walking and relaxed breathing 3 times a day . I have mailed the Financial Assistance application.  . Why is this important?    Beating depression may take some time.   If you don't feel better right away, don't give up on your treatment plan.       Patient verbalizes understanding of instructions provided today and agrees to view in [place].   Telephone follow up appointment with care management team member scheduled for:08/26/20  Casimer Lanius, [place] Management [facility] Coordination  [phone]

## 2020-08-19 NOTE — Progress Notes (Signed)
Bailey Hooper 1965/10/15 [id number]   History:    55 y.o. G2P2L1 Boyfriend  RP: Established patient presenting for annual gyn exam   HPI: Postmenopause with hot flushes/night sweats with insomnia. Taking Estroven but not controlling Sxs. No PMB.  No pelvic pain. Condoms as needed.  Pap Neg in 2019.   Urine and bowel movements normal. Breasts normal.  Health labs with family physician.  Depression/Anxiety, f/u with psychiatry next week.  No suicidal ideation.  Colono 06/2020 12 Polyps removed.  Father Colon Ca.  F/U Colonoscopy in 6 mths.  Past medical history,surgical history, family history and social history were all reviewed and documented in the EPIC chart.  Gynecologic History No LMP recorded. Patient is perimenopausal.  Obstetric History OB History  Gravida Para Term Preterm AB Living  2 2   2   1   SAB IAB Ectopic Multiple Live Births      0        # Outcome Date GA Lbr Len/2nd Weight Sex Delivery Anes PTL Lv  2 Preterm           1 Preterm              ROS: A ROS was performed and pertinent positives and negatives are included in the history.  GENERAL: No fevers or chills. HEENT: No change in vision, no earache, sore throat or sinus congestion. NECK: No pain or stiffness. CARDIOVASCULAR: No chest pain or pressure. No palpitations. PULMONARY: No shortness of breath, cough or wheeze. GASTROINTESTINAL: No abdominal pain, nausea, vomiting or diarrhea, melena or bright red blood per rectum. GENITOURINARY: No urinary frequency, urgency, hesitancy or dysuria. MUSCULOSKELETAL: No joint or muscle pain, no back pain, no recent trauma. DERMATOLOGIC: No rash, no itching, no lesions. ENDOCRINE: No polyuria, polydipsia, no heat or cold intolerance. No recent change in weight. HEMATOLOGICAL: No anemia or easy bruising or bleeding. NEUROLOGIC: No headache, seizures, numbness, tingling or weakness. PSYCHIATRIC: No depression, no loss of interest in normal activity or change in sleep pattern.      Exam:   BP (!) 144/92   Ht 5\' 6"  (1.676 m)   Wt 181 lb (82.1 kg)   BMI 29.21 kg/m   Body mass index is 29.21 kg/m.  General appearance : Well developed well nourished female. No acute distress HEENT: Eyes: no retinal hemorrhage or exudates,  Neck supple, trachea midline, no carotid bruits, no thyroidmegaly Lungs: Clear to auscultation, no rhonchi or wheezes, or rib retractions  Heart: Regular rate and rhythm, no murmurs or gallops Breast:Examined in sitting and supine position were symmetrical in appearance, no palpable masses or tenderness,  no skin retraction, no nipple inversion, no nipple discharge, no skin discoloration, no axillary or supraclavicular lymphadenopathy Abdomen: no palpable masses or tenderness, no rebound or guarding Extremities: no edema or skin discoloration or tenderness  Pelvic: Vulva: Normal             Vagina: No gross lesions or discharge  Cervix: No gross lesions or discharge.  Pap reflex done.  Uterus  AV, normal size, shape and consistency, non-tender and mobile  Adnexa  Without masses or tenderness  Anus: Normal   Assessment/Plan:  55 y.o. female for annual exam   1. Encounter for routine gynecological examination with Papanicolaou smear of cervix Normal gynecologic exam in menopause.  Pap reflex done.  Breasts normal.  Screening mammo 08/2020 Negative.  Colono with many polyps removed and fam hx positive for Colon Ca, repeat Colono in 6 months.  Health labs with Fam MD. BMI 29.21. Fitness and lower calorie/carb diet recommended.  2. Menopause syndrome Counseling done on menopause and HRT.  Will set an appointment if decides to start HRT.  Princess Bruins MD, 3:53 PM 08/19/2020

## 2020-08-24 LAB — CYTOLOGY - PAP
Adequacy: ABSENT
Diagnosis: NEGATIVE

## 2020-08-26 ENCOUNTER — Ambulatory Visit: Payer: 59 | Admitting: Licensed Clinical Social Worker

## 2020-08-26 DIAGNOSIS — Z7189 Other specified counseling: Secondary | ICD-10-CM

## 2020-08-26 NOTE — Patient Instructions (Addendum)
Visit Information  Goals Addressed            This Visit's Progress   . Begin and Stick with Counseling-Depression   On track    Timeframe:  Short-Term Goal Priority:  High Start Date:   08/12/2020                          Expected End Date:                          Patient Goals/Self-Care Activities: Over the next 10 days .   Keep appointment  with [facility] [phone] today at 3:00 . [place] for counseling 09/06/20  . Continue to review EMMI educational information (breathing to relax, depression and Movement: emotional health) . Continue with your walking and relaxed breathing 3 times a day  Call your insurance provider to inquire if they provide transportation, if they to not provider transportation you may contact [facility] at [phone]  . Why is this important?    Beating depression may take some time.   If you don't feel better right away, don't give up on your treatment plan.       Patient verbalizes understanding of instructions provided today and agrees to view in [place].   The patient has been provided with contact information for the care management team and has been advised to call with any health related questions or concerns.  No further follow up required: LCSW at this time  Casimer Lanius, [place]

## 2020-08-26 NOTE — Chronic Care Management (AMB) (Signed)
Care Management   Clinical Social Work Note  08/26/2020 Name: Bailey Hooper MRN: [id number] DOB: 20-Aug-1965  Bailey Hooper is a 55 y.o. year old female who is a primary care patient of Bailey Malay, MD. The CCM team was consulted to assist the patient with chronic disease management and/or care coordination needs related to: Mental Health Counseling and Resources.   Engaged with patient by telephone for follow up visit in response to provider referral for social work chronic care management and care coordination services.   Consent to Services:  The patient was given information about Chronic Care Management services, agreed to services, and gave verbal consent prior to initiation of services.  Please see initial visit note for detailed documentation.   Patient agreed to services and consent obtained.   Assessment: Patient is making progress with connecting to mental health providers.  Has appointment with psychiatry to day 08/26/20 at 3:00 and counseling appointment 09/06/20 10:00.  See Care Plan below for interventions and patient self-care actives.  Follow up Plan: Patient does not require or desire continued follow-up. Will contact the office if needed, Patient may benefit from and is in agreement for CCM LCSW to remain part of care team for the next 30 days.  If no needs are identified in the next 30 days, CCM LCSW will disconnect from the care team.    Review of patient past medical history, allergies, medications, and health status, including review of relevant consultants reports was performed today as part of a comprehensive evaluation and provision of chronic care management and care coordination services.     SDOH (Social Determinants of Health) assessments and interventions performed:    Advanced Directives Status: Not addressed in this encounter.  CCM Care Plan  Allergies  Allergen Reactions  . Lisinopril Palpitations and Cough    Per pt report  . Lamictal [Lamotrigine] Hives   . Penicillins Rash    Vesicles, denies SOB or breathing issues  . Erythromycin Nausea And Vomiting    Outpatient Encounter Medications as of 08/26/2020  Medication Sig  . albuterol (VENTOLIN HFA) 108 (90 Base) MCG/ACT inhaler Inhale 2 puffs into the lungs every 6 (six) hours as needed for wheezing.  . AMBULATORY NON FORMULARY MEDICATION GI Cock tail: 90 ml viscous lidocaine, 90 ml 10/mg/ 5 ml dicyclomine, 270 ml maalox  . amLODipine (NORVASC) 10 MG tablet Take 1 tablet (10 mg total) by mouth daily.  Marland Kitchen azelastine (OPTIVAR) 0.05 % ophthalmic solution Place 1 drop into both eyes 2 (two) times daily.  . Black Cohosh (BLACK COHOSH HOT FLASH RELIEF) 40 MG CAPS Take 1 per day  . cetirizine (ZYRTEC) 10 MG tablet Take 1 tablet (10 mg total) by mouth daily.  Marland Kitchen dicyclomine (BENTYL) 20 MG tablet 1/2 to 1 tab 3 times daily before meals and at bedtime  . gabapentin (NEURONTIN) 300 MG capsule Take 1 capsule (300 mg total) by mouth at bedtime.  . hydrOXYzine (ATARAX/VISTARIL) 25 MG tablet Take 1/2 to one tab daily for anxiety and sleep  . lansoprazole (PREVACID) 30 MG capsule Take 1 capsule (30 mg total) by mouth daily at 12 noon.  . metoprolol succinate (TOPROL-XL) 25 MG 24 hr tablet Take 1 tablet (25 mg total) by mouth at bedtime. Take with or immediately following a meal.  . mometasone (NASONEX) 50 MCG/ACT nasal spray 2 sprays each nostril daily  . montelukast (SINGULAIR) 10 MG tablet Take 1 tablet (10 mg total) by mouth at bedtime.  . Multiple Minerals-Vitamins (CAL MAG  ZINC +D3) TABS Take 1 tablet by mouth daily.  . Multiple Vitamin (MULTIVITAMIN) capsule Take 1 capsule by mouth daily.  . ondansetron (ZOFRAN) 4 MG tablet Take 1 tablet (4 mg total) by mouth every 6 (six) hours.  . SUMAtriptan (IMITREX) 50 MG tablet May repeat in 2 hours if headache persists or recurs with max of 100 mg in 24 hours.  . traZODone (DESYREL) 50 MG tablet Take 0.5-1 tablets (25-50 mg total) by mouth at bedtime as needed for  sleep.  . [DISCONTINUED] loratadine (CLARITIN) 10 MG tablet Take 1 tablet (10 mg total) by mouth daily. (Patient not taking: Reported on 11/19/2018)   No facility-administered encounter medications on file as of 08/26/2020.    Patient Active Problem List   Diagnosis Date Noted  . NAFLD (nonalcoholic fatty liver disease) 08/05/2020  . H/O colonoscopy 06/16/2020  . Hot flashes 04/27/2019  . Bipolar disorder ([place]) 01/18/2019  . Asthma 01/18/2019  . Hyperlipidemia 07/14/2018  . Tobacco abuse 11/29/2017  . Benign paroxysmal positional vertigo 11/29/2017  . Major depressive disorder, recurrent episode, moderate ([place]) 06/28/2009  . GENITAL HERPES 01/17/2009  . Migraine headache 01/17/2009  . Essential hypertension, benign 01/17/2009  . GERD (gastroesophageal reflux disease) 01/17/2009  . Irritable bowel syndrome 01/17/2009  . Heart murmur 01/17/2009    Conditions to be addressed/monitored: Anxiety, Depression and Bipolar Disorder; Mental Health Concerns   Care Plan : General Social Work (Adult)  Updates made by Maurine Cane, LCSW since 08/26/2020 12:00 AM  Problem: symptoms of Anxiety and Depression unmanaged   Goal: connect for medication managment and counseling   Start Date: 08/12/2020  Expected End Date: 09/06/2020  This Visit's Progress: On track  Recent Progress: On track  Priority: High  Current barriers:   . Chronic Mental Health needs related to managing symptoms of anxiety and depression  . Unable to locate a mental health provider that take her insurance   Needs Support, Education, and Care Coordination in order to meet unmet mental health needs. Clinical Goal(s): Over the next 30 days, patient will keep all appointments for ongoing counseling and med management.  Clinical Interventions:  . Assessed patient's needs, coping skills,support system and barriers to care  . Other interventions: Solution-Focused Strategies, Active listening, Emotional Supportive Provided,  Problem Solving. . Provided EMMI education information on breathing to relax, depression and Movement: emotional health  . LCSW f/u on internal referral placed to [facility] for counseling; Appointment scheduled 09/06/20 at 10:00 . LCSW called [facility] [phone] with Patient on the line for medication management appointment scheduled for today 08/26/20 at 3:00  . Inter-disciplinary care team collaboration (see longitudinal plan of care) Patient Goals/Self-Care Activities: Over the next 30 days . Keep appointment  with [facility] [phone] today at 3:00 . [place] for counseling 09/06/20  . Continue to review EMMI educational information (breathing to relax, depression and Movement: emotional health) . Continue with your walking and relaxed breathing 3 times a day  Call your insurance provider to inquire if they provide transportation, if they to not provider transportation you may contact [facility] at [place], [place]   [phone] 12:06 PM

## 2020-08-29 ENCOUNTER — Telehealth: Payer: Self-pay | Admitting: Family Medicine

## 2020-08-29 ENCOUNTER — Encounter: Payer: Self-pay | Admitting: Family Medicine

## 2020-08-29 DIAGNOSIS — R42 Dizziness and giddiness: Secondary | ICD-10-CM

## 2020-08-29 NOTE — Telephone Encounter (Signed)
Called patient to discuss Neurologyy. Would prefer provider in [place]. New referral placed, also will need assistance with transportation.  Dorris Singh, MD  Family Medicine Teaching Service

## 2020-08-30 ENCOUNTER — Telehealth: Payer: Self-pay | Admitting: Family Medicine

## 2020-08-30 ENCOUNTER — Encounter: Payer: Self-pay | Admitting: Obstetrics & Gynecology

## 2020-08-30 NOTE — Telephone Encounter (Signed)
   Telephone encounter was:  Unsuccessful.  08/30/2020 Name: Stepfanie Yott MRN: [id number] DOB: 10-Jul-1965  Unsuccessful outbound call made today to assist with:  Transportation Needs   Outreach Attempt:  1st Attempt  A HIPAA compliant voice message was left requesting a return call.  Instructed patient to call back at [phone].  [place], Care Management Phone: [phone] Email: [email or web address]

## 2020-09-06 ENCOUNTER — Other Ambulatory Visit: Payer: Self-pay

## 2020-09-06 ENCOUNTER — Ambulatory Visit (INDEPENDENT_AMBULATORY_CARE_PROVIDER_SITE_OTHER): Payer: 59 | Admitting: Clinical

## 2020-09-06 DIAGNOSIS — F411 Generalized anxiety disorder: Secondary | ICD-10-CM | POA: Diagnosis not present

## 2020-09-06 DIAGNOSIS — F3132 Bipolar disorder, current episode depressed, moderate: Secondary | ICD-10-CM
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
## 2020-09-07 ENCOUNTER — Encounter: Payer: Self-pay | Admitting: Family Medicine

## 2020-09-07 ENCOUNTER — Ambulatory Visit: Payer: 59 | Admitting: Family Medicine

## 2020-09-07 ENCOUNTER — Ambulatory Visit (INDEPENDENT_AMBULATORY_CARE_PROVIDER_SITE_OTHER): Payer: 59 | Admitting: Family Medicine

## 2020-09-07 ENCOUNTER — Other Ambulatory Visit: Payer: Self-pay

## 2020-09-07 VITALS — BP 122/60 | HR 79 | Ht 66.0 in | Wt 182.8 lb

## 2020-09-07 DIAGNOSIS — E782 Mixed hyperlipidemia: Secondary | ICD-10-CM | POA: Diagnosis not present

## 2020-09-07 DIAGNOSIS — N750 Cyst of Bartholin's gland: Secondary | ICD-10-CM

## 2020-09-07 DIAGNOSIS — I10 Essential (primary) hypertension: Secondary | ICD-10-CM

## 2020-09-07 DIAGNOSIS — K760 Fatty (change of) liver, not elsewhere classified: Secondary | ICD-10-CM | POA: Diagnosis not present

## 2020-09-07 MED ORDER — SITZ BATH MISC: 1.0000 | Freq: Two times a day (BID) | 0 refills | Status: DC

## 2020-09-07 NOTE — Progress Notes (Signed)
    SUBJECTIVE:   CHIEF COMPLAINT / HPI: vaginal bump and labs   Patient recently had RUQ ultrasound with findings concerning for NAFLD. She reports for follow up today and to complete hepatitis B serologies along with other blood work to evaluate for cause of liver findings. She denies presence of RUQ pain today.   Vaginal Bump Patient reports three days ago, she noticed a bump on the left labia. She reports that the tenderness has improved over the course of the last few days. She states that she has a history of boils in her inguinal region but has never had a boil inside her vaginal. She denies concern for STI stating that she has not been sexually active in 5 months. She denies any purulent drainage or bleeding. Area is not pruritic. She states that she can not see the area due to location.   PERTINENT  PMH / PSH:  Migrain  HTN  Asthma  Hot flashes   OBJECTIVE:   BP 122/60   Pulse 79   Ht 5\' 6"  (1.676 m)   Wt 182 lb 12.8 oz (82.9 kg)   SpO2 99%   BMI 29.50 kg/m   General: female appearing stated age in no acute distress Cardio: Normal S1 and S2, no S3 or S4. Rhythm is regular. No murmurs or rubs.  Bilateral radial pulses palpable Pulm: Clear to auscultation bilaterally, no crackles, wheezing, or diminished breath sounds. Normal respiratory effort, stable on RA Abdomen: Bowel sounds normal. Abdomen soft and non-tender.   Genitalia:  Normal introitus for age, left labial bartholin cyst ~1cm in diameter without erythema, no drainage; no vaginal discharge, mucosa pink and moist, no vaginal or cervical lesions, no vaginal atrophy, no friaility or hemorrhage    ASSESSMENT/PLAN:   Bartholin cyst Given small size and lack of objective findings concerning for active infection, will have patient do sitz baths  Given return precautions and handout   NAFLD (nonalcoholic fatty liver disease) Ordered Hep B surface antigen  Hep B core antibody  Ferritin  BmP  Repeat lipid panel        Eulis Foster, MD [place]

## 2020-09-07 NOTE — Patient Instructions (Addendum)
I believe that the area you have noticed is due to a bartholin cyst. Based on the appearance of the cyst, it is likely to resolve on its own. Please let us know if it increases in size, becomes more painful or you begin to notice bleeding or large amounts of drainage as we may need to prescribe antibiotics.   I recommend using a sitz bath to help with resolution 1 -2 times daily.   In regards to your lab work, we will follow up once the results are available.   Bartholin's Cyst  A Bartholin's cyst is a fluid-filled sac that forms on a Bartholin's gland. Bartholin's glands are small glands in the folds of skin near the opening of the vagina (labia). This type of cyst causes a bulge or lump near the opening of the vagina. If you have a cyst that is small and not infected, you may be able to take care of it at home. If your cyst gets infected, it may cause pain and your doctor may need to drain it. What are the causes? This condition may be caused by a blocked Bartholin's gland. Germs (bacteria) inside of the cyst can cause an infection. What are the signs or symptoms?  A bulge or lump near the opening of the vagina.  Discomfort or pain.  Redness, swelling, or fluid draining from the area. How is this treated? You may not need treatment if your cyst is not causing symptoms. The cyst can go away on its own with home care. Home care includes hot baths or heat therapy. Large cysts or cysts that are infected may be treated with:  Antibiotic medicine.  A procedure to drain the fluid. Cysts that keep coming back will need to be drained many times. Your doctor may talk to you about surgery to remove the cyst. Follow these instructions at home: Medicines  Take over-the-counter and prescription medicines only as told by your doctor.  If you were prescribed an antibiotic medicine, take it as told by your doctor. Do not stop taking it even if you start to feel better. Managing pain and  swelling  Try sitz baths to help with pain and swelling. A sitz bath is a warm water bath in which the water only comes up to your hips and should cover your buttocks. You may take sitz baths a few times a day.  If told, put heat on the affected area as often as needed. Use the heat source that your doctor recommends, such as a moist heat pack or a heating pad. ? Place a towel between your skin and the heat source. ? Leave the heat on for 20-30 minutes. ? Take off the heat if your skin turns bright red. This is very important. If you cannot feel pain, heat, or cold, you have a greater risk of getting burned. General instructions  If your cyst was drained: ? Follow instructions from your doctor about how to take care of your wound. ? Use feminine pads to absorb any fluid.  Do not push on or squeeze your cyst.  Do not have sex until the cyst has gone away or your wound from drainage has healed.  Take these steps to help prevent a cyst from returning, and to prevent other cysts from forming: ? Take a bath or shower once a day. Clean the area around your vagina with mild soap and water when you bathe. ? Practice safe sex to prevent STIs. Talk with your doctor about how  to prevent STIs and which forms of birth control to use.  Keep all follow-up visits. Contact a doctor if:  You have a fever.  You get more redness, swelling, or pain around your cyst.  You have fluid, blood, pus, or a bad smell coming from your cyst.  You have a cyst that gets larger or a cyst that comes back. Summary  A Bartholin's cyst is a fluid-filled sac that forms on a Bartholin's gland. These small glands are found in the folds of skin near the opening of the vagina (labia).  This type of cyst causes a bulge or lump near the opening of the vagina.  Try sitz baths a few times a day to help with pain and swelling.  Do not push on or squeeze your cyst. This information is not intended to replace advice given to  you by your health care provider. Make sure you discuss any questions you have with your health care provider. Document Revised: 08/24/2019 Document Reviewed: 08/24/2019 Elsevier Patient Education  [place].

## 2020-09-08 LAB — LIPID PANEL
Chol/HDL Ratio: 4.6 ratio — ABNORMAL HIGH (ref 0.0–4.4)
Cholesterol, Total: 183 mg/dL (ref 100–199)
HDL: 40 mg/dL (ref >39)
LDL Chol Calc (NIH): 120 mg/dL — ABNORMAL HIGH (ref 0–99)
Triglycerides: 128 mg/dL (ref 0–149)
VLDL Cholesterol Cal: 23 mg/dL (ref 5–40)

## 2020-09-08 LAB — BASIC METABOLIC PANEL
BUN/Creatinine Ratio: 21 (ref 9–23)
BUN: 13 mg/dL (ref 6–24)
CO2: 23 mmol/L (ref 20–29)
Calcium: 9 mg/dL (ref 8.7–10.2)
Chloride: 103 mmol/L (ref 96–106)
Creatinine, Ser: 0.63 mg/dL (ref 0.57–1.00)
Glucose: 95 mg/dL (ref 65–99)
Potassium: 3.8 mmol/L (ref 3.5–5.2)
Sodium: 142 mmol/L (ref 134–144)
eGFR: 105 mL/min/{1.73_m2} (ref >59)

## 2020-09-08 LAB — FERRITIN: Ferritin: 195 ng/mL — ABNORMAL HIGH (ref 15–150)

## 2020-09-08 LAB — HEPATIC FUNCTION PANEL
ALT: 23 IU/L (ref 0–32)
AST: 16 IU/L (ref 0–40)
Albumin: 4.4 g/dL (ref 3.8–4.9)
Alkaline Phosphatase: 120 IU/L (ref 44–121)
Bilirubin Total: <0.2 mg/dL (ref 0.0–1.2)
Bilirubin, Direct: <0.1 mg/dL (ref 0.00–0.40)
Total Protein: 7.1 g/dL (ref 6.0–8.5)

## 2020-09-08 LAB — HEPATITIS B CORE ANTIBODY, TOTAL: Hep B Core Total Ab: NEGATIVE

## 2020-09-08 LAB — HEPATITIS B SURFACE ANTIGEN: Hepatitis B Surface Ag: NEGATIVE

## 2020-09-08 LAB — HEPATITIS B SURFACE ANTIBODY,QUALITATIVE: Hep B Surface Ab, Qual: REACTIVE

## 2020-09-09 ENCOUNTER — Telehealth: Payer: Self-pay | Admitting: Family Medicine

## 2020-09-09 ENCOUNTER — Encounter: Payer: Self-pay | Admitting: Family Medicine

## 2020-09-09 NOTE — Telephone Encounter (Signed)
   Telephone encounter was:  Unsuccessful.  09/09/2020 Name: Bailey Hooper MRN: [id number] DOB: 1965/09/17  Unsuccessful outbound call made today to assist with:  Transportation Needs   Outreach Attempt:  2nd Attempt  Mail box is full cannot leave a message.  [place], Care Management Phone: [phone] Email: [email or web address]

## 2020-09-10 DIAGNOSIS — N750 Cyst of Bartholin's gland: Secondary | ICD-10-CM | POA: Insufficient documentation

## 2020-09-10 NOTE — Assessment & Plan Note (Signed)
Given small size and lack of objective findings concerning for active infection, will have patient do sitz baths  Given return precautions and handout

## 2020-09-10 NOTE — Assessment & Plan Note (Signed)
Ordered Hep B surface antigen  Hep B core antibody  Ferritin  BmP  Repeat lipid panel

## 2020-09-12 ENCOUNTER — Telehealth: Payer: Self-pay | Admitting: Family Medicine

## 2020-09-12 NOTE — Telephone Encounter (Signed)
Called patient to review results. Hepatitis B serologies appropriate.  She has been vaccinated.  We discussed the recommended statin given her ASCVD risk and fatty liver.  She would like to hold off at this time.  Recommend repeat LDL in potentially 4 to 6 weeks after dietary changes.  Given age recommend she reach out to her gynecologist about Bartholin cyst.  The 10-year ASCVD risk score Mikey Bussing DC Brooke Bonito., et al., 2013) is: 9.7%   Values used to calculate the score:     Age: 55 years     Sex: Female     Is Non-Hispanic African American: Yes     Diabetic: No     Tobacco smoker: Yes     Systolic Blood Pressure: 478 mmHg     Is BP treated: Yes     HDL Cholesterol: 40 mg/dL     Total Cholesterol: 183 mg/dL

## 2020-09-14 ENCOUNTER — Telehealth: Payer: Self-pay | Admitting: Family Medicine

## 2020-09-14 DIAGNOSIS — H8111 Benign paroxysmal vertigo, right ear: Secondary | ICD-10-CM

## 2020-09-14 NOTE — Telephone Encounter (Signed)
Called patient about Neurology referrals (Declined). Referred to ENT for vertigo. All questions answered.   Dorris Singh, MD  Family Medicine Teaching Service

## 2020-09-15 ENCOUNTER — Encounter: Payer: Self-pay | Admitting: Family Medicine

## 2020-09-21 ENCOUNTER — Ambulatory Visit (INDEPENDENT_AMBULATORY_CARE_PROVIDER_SITE_OTHER): Payer: 59 | Admitting: Clinical

## 2020-09-21 ENCOUNTER — Other Ambulatory Visit: Payer: Self-pay

## 2020-09-21 DIAGNOSIS — F3132 Bipolar disorder, current episode depressed, moderate: Secondary | ICD-10-CM | POA: Diagnosis not present

## 2020-09-21 DIAGNOSIS — F411 Generalized anxiety disorder: Secondary | ICD-10-CM

## 2020-09-21 NOTE — Progress Notes (Addendum)
Virtual Visit via Telephone Note  I connected with Bailey Hooper on 09/21/20 at  2:00 PM EDT by telephone and verified that I am speaking with the correct person using two identifiers.  Location: Patient: Home Provider: Office   I discussed the limitations, risks, security and privacy concerns of performing an evaluation and management service by telephone and the availability of in person appointments. I also discussed with the patient that there may be a patient responsible charge related to this service. The patient expressed understanding and agreed to proceed.  THERAPIST PROGRESS NOTE   Session Time: 2:00 PM-2:45PM   Participation Level: Active   Behavioral Response: CasualAlertAnxious   Type of Therapy: Individual Therapy   Treatment Goals addressed: Coping   Interventions: CBT   Summary: Bailey Hooper is a 55 y.o. female who presents with Bipolar Disorder./ GAD. The OPT therapist worked with the patient for her initial OPT treatment session. The OPT therapist utilized Motivational Interviewing to assist in creating therapeutic repore. The patient in the session was engaged and work in collaboration giving feedback about her triggers and symptoms over the past few weeks. The OPT therapist utilized Cognitive Behavioral Therapy through cognitive restructuring as well as worked with the patient on coping strategies to assist in management of mood and as she continues to work on her interactions with family. The patient reviewed her current medication and effectiveness. The patient spoke about ongoing readiness to return to work.   Suicidal/Homicidal: Nowithout intent/plan   Therapist Response: The OPT therapist worked with the patient for the patients scheduled session. The patient was engaged in her session and gave feedback in relation to triggers, symptoms, and behavior responses over the past few weeks. The OPT therapist worked with the patient utilizing an in session Cognitive  Behavioral Therapy exercise. The patient was responsive in the session and verbalized, " I am taking my medication and I am working on my Anxiety". The OPT therapist worked with the patient on implementing positive thinking and reviewing upcoming care appointments. The patient spoke about her relationship with her family. The OPT therapist will continue treatment work with the patient in her next scheduled session   Plan: Return again in 3 weeks.   Diagnosis:      Axis I: Bipolar Disorder/ GAD                             Axis II: No diagnosis   I discussed the assessment and treatment plan with the patient. The patient was provided an opportunity to ask questions and all were answered. The patient agreed with the plan and demonstrated an understanding of the instructions.   The patient was advised to call back or seek an in-person evaluation if the symptoms worsen or if the condition fails to improve as anticipated.   I provided 45 minutes of non-face-to-face time during this encounter.   Lennox Grumbles, LCSW   09/21/2020

## 2020-09-22 ENCOUNTER — Telehealth: Payer: Self-pay | Admitting: Family Medicine

## 2020-09-22 NOTE — Telephone Encounter (Signed)
   Telephone encounter was:  Unsuccessful.  09/22/2020 Name: Bailey Hooper MRN: [id number] DOB: [birth year]  Unsuccessful outbound call made today to assist with:  Transportation Needs   Outreach Attempt:  3rd Attempt.  Referral closed unable to contact patient.  A HIPAA compliant voice message was left requesting a return call.  Instructed patient to call back at [phone].  [place], Care Management Phone: [phone] Email: [email or web address]

## 2020-09-29 ENCOUNTER — Other Ambulatory Visit: Payer: Self-pay | Admitting: *Deleted

## 2020-09-29 DIAGNOSIS — J45909 Unspecified asthma, uncomplicated: Secondary | ICD-10-CM

## 2020-09-29 MED ORDER — ALBUTEROL SULFATE HFA 108 (90 BASE) MCG/ACT IN AERS: 2.0000 | INHALATION_SPRAY | Freq: Four times a day (QID) | RESPIRATORY_TRACT | 2 refills | Status: DC | PRN

## 2020-10-13 ENCOUNTER — Other Ambulatory Visit: Payer: Self-pay

## 2020-10-13 ENCOUNTER — Ambulatory Visit (INDEPENDENT_AMBULATORY_CARE_PROVIDER_SITE_OTHER): Payer: 59 | Admitting: Clinical

## 2020-10-13 DIAGNOSIS — F411 Generalized anxiety disorder: Secondary | ICD-10-CM

## 2020-10-13 DIAGNOSIS — F3132 Bipolar disorder, current episode depressed, moderate: Secondary | ICD-10-CM | POA: Diagnosis not present

## 2020-10-13 NOTE — Progress Notes (Signed)
Virtual Visit via Telephone Note   I connected with Bailey Hooper on 10/13/20 at  2:00 PM EDT by telephone and verified that I am speaking with the correct person using two identifiers.   Location: Patient: Home Provider: Office   I discussed the limitations, risks, security and privacy concerns of performing an evaluation and management service by telephone and the availability of in person appointments. I also discussed with the patient that there may be a patient responsible charge related to this service. The patient expressed understanding and agreed to proceed.   THERAPIST PROGRESS NOTE   Session Time: 2:00 PM-2:30PM   Participation Level: Active   Behavioral Response: CasualAlertAnxious   Type of Therapy: Individual Therapy   Treatment Goals addressed: Coping   Interventions: CBT   Summary: Bailey Hooper is a 55 y.o. female who presents with Bipolar Disorder./ GAD. The OPT therapist worked with the patient for her initial OPT treatment session. The OPT therapist utilized Motivational Interviewing to assist in creating therapeutic repore. The patient in the session was engaged and work in collaboration giving feedback about her triggers and symptoms over the past few weeks. 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 The patient reviewed her current medication and effectiveness.    Suicidal/Homicidal: Nowithout intent/plan   Therapist Response: The OPT therapist worked with the patient for the patients scheduled session. The patient was engaged in her session and gave feedback in relation to triggers, symptoms, and behavior responses over the past few weeks. The OPT  therapist worked with the patient utilizing an in session Cognitive Behavioral Therapy exercise. 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 The OPT therapist worked with the patient on implementing positive thinking and reviewing upcoming care appointments. The patient spoke about her referral to a neurologist for her vertigo. The OPT therapist will continue treatment work with the patient in her next scheduled session   Plan: Return again in 3 weeks.   Diagnosis:      Axis I: Bipolar Disorder/ GAD                             Axis II: No diagnosis   I discussed the assessment and treatment plan with the patient. The patient was provided an opportunity to ask questions and all were answered. The patient agreed with the plan and demonstrated an understanding of the instructions.   The patient was advised to call back or seek an in-person evaluation if the symptoms worsen or if the condition fails to improve as anticipated.   I provided 45 minutes of non-face-to-face time during this encounter.   Lennox Grumbles, LCSW   10/13/2020

## 2020-10-25 ENCOUNTER — Encounter: Payer: Self-pay | Admitting: Family Medicine

## 2020-10-25 DIAGNOSIS — M79641 Pain in right hand: Secondary | ICD-10-CM

## 2020-11-03 ENCOUNTER — Ambulatory Visit: Payer: 59 | Admitting: Occupational Therapy

## 2020-11-04 ENCOUNTER — Ambulatory Visit: Payer: 59 | Admitting: Occupational Therapy

## 2020-11-10 ENCOUNTER — Ambulatory Visit: Payer: 59 | Admitting: Occupational Therapy

## 2020-11-16 ENCOUNTER — Other Ambulatory Visit: Payer: Self-pay

## 2020-11-16 ENCOUNTER — Encounter: Payer: Self-pay | Admitting: Occupational Therapy

## 2020-11-16 ENCOUNTER — Ambulatory Visit: Payer: 59 | Attending: Family Medicine | Admitting: Occupational Therapy

## 2020-11-16 DIAGNOSIS — M25642 Stiffness of left hand, not elsewhere classified: Secondary | ICD-10-CM

## 2020-11-16 DIAGNOSIS — M25641 Stiffness of right hand, not elsewhere classified: Secondary | ICD-10-CM | POA: Diagnosis present

## 2020-11-16 DIAGNOSIS — M25541 Pain in joints of right hand: Secondary | ICD-10-CM

## 2020-11-16 DIAGNOSIS — M25542 Pain in joints of left hand: Secondary | ICD-10-CM

## 2020-11-16 DIAGNOSIS — R600 Localized edema: Secondary | ICD-10-CM

## 2020-11-16 NOTE — Therapy (Signed)
[facility] [address] [place], [place], [address] Phone: [phone]   Fax:  [phone]  Occupational Therapy Evaluation  Patient Details  Name: Bailey Hooper MRN: [id number] Date of Birth: [birth year] Referring Provider (OT): Dorris Singh   Encounter Date: 11/16/2020   OT End of Session - 11/16/20 1803     Visit Number 1    Number of Visits 5    Date for OT Re-Evaluation 01/15/21    Authorization Type [employer] [id number]    OT Start Time [id number]    OT Stop Time 1755    OT Time Calculation (min) 50 min    Activity Tolerance Patient tolerated treatment well    Behavior During Therapy WFL for tasks assessed/performed             Past Medical History:  Diagnosis Date   Allergy    Anxiety    Arthritis    Asthma    Depression    Fibroids    GERD (gastroesophageal reflux disease)    Hiatal hernia    High cholesterol    Hypertension    IBS (irritable bowel syndrome)    Migraines    Prediabetes    Sleep apnea    cpap   Tobacco use    Vertigo     Past Surgical History:  Procedure Laterality Date   CESAREAN SECTION     COLONOSCOPY     FOOT SURGERY     Left foot    There were no vitals filed for this visit.   Subjective Assessment - 11/16/20 1712     Subjective  I don't want to take more medicine    Pertinent History anxiety    Currently in Pain? Yes    Pain Score 3     Pain Location Hand    Pain Orientation Right    Pain Descriptors / Indicators Aching    Pain Type Chronic pain    Pain Onset More than a month ago    Pain Frequency Intermittent    Aggravating Factors  unsure - worse in am    Pain Relieving Factors movement, tylenol    Effect of Pain on Daily Activities dropping items form hand               [facility] OT Assessment - 11/16/20 0001       Assessment   Medical Diagnosis pain in both hands    Referring Provider (OT) Dorris Singh    Onset Date/Surgical Date 10/26/20     Hand Dominance Right    Prior Therapy NA      Precautions   Precautions None      Restrictions   Weight Bearing Restrictions No      Prior Function   Level of Independence Independent with basic ADLs    Vocation --   [employer]WE[employer] JO[employer]   Leisure Grandbabies, outdoor concerts, reading, family,      ADL   Eating/Feeding Independent   dropping items   Grooming Modified independent   daighter assists with braiding hair   Upper Body Bathing Modified independent   difficulty wringing out washcloth   Upper Body Dressing Increased time    Lower Body Dressing Increased time    Toilet Transfer Independent    Toileting - Clothing Manipulation Increased time    Toileting -  Hygiene Increase time    Clinical cytogeneticist Independent      IADL   Prior Level of Function Light Housekeeping independent  Light Housekeeping --   increasd time   Prior Level of Function Meal Prep independent    Meal Prep --   increased time, dropping items, occasional burns on hands     Written Expression   Dominant Hand Right    Handwriting 100% legible      Sensation   Light Touch Appears Intact    Stereognosis Appears Intact    Hot/Cold Impaired by gross assessment   per report - has burns on dorsum of hands     Coordination   Gross Motor Movements are Fluid and Coordinated Yes    Fine Motor Movements are Fluid and Coordinated No    Finger Nose Finger Test mild dysmetria    9 Hole Peg Test Right;Left    Right 9 Hole Peg Test 34.72    Left 9 Hole Peg Test 35.37      Perception   Perception Within Functional Limits      Praxis   Praxis Intact      Hand Function   Right Hand Gross Grasp Impaired    Right Hand Grip (lbs) 30.2    Right Hand Lateral Pinch 9 lbs    Left Hand Gross Grasp Impaired    Left Hand Lateral Pinch 12 lbs                             OT Education - 11/16/20 1803     Education Details use warm water, or hot packs throughout day.  Use movement to help  reduce stiffness - fisting and opening hands - speak with MD regarding OTC anti-inflammatory    Person(s) Educated Patient    Methods Explanation    Comprehension Verbalized understanding                 OT Long Term Goals - 11/16/20 1811       OT LONG TERM GOAL #1   Title Patient will complete an HEP designed to improve coordiantion in BUE    Time 4    Period Weeks    Status New    Target Date 01/15/21      OT LONG TERM GOAL #2   Title Patient will complete an HEP designed to improve BUE hand strength    Time 4    Period Weeks    Status New      OT LONG TERM GOAL #3   Title Patient will demonstrate understanding of pain and edema management techniques for Bilateral hands    Time 4    Period Weeks    Status New      OT LONG TERM GOAL #4   Title Patient will demonstrate 3 lb increase in grip strength bilaterally    Time 4    Period Weeks    Status New      OT LONG TERM GOAL #5   Title Patient will demonstrate 3 second reduction in 9 hole peg test bilaterally    Time 4    Period Weeks    Status New      Long Term Additional Goals   Additional Long Term Goals Yes                   Plan - 11/16/20 1804     Clinical Impression Statement Patient is a 55 yr old woman with several month history of hand pain, weakness, swelling.  Patient presents to OT evaluation with report of pain at  MCP joints 3-5 right hand, and more pronounced discomfort in long finger bilaterally.  Patient reports hands are worse in the morning, and has pain intermittenlty throughout the day.  She reports decreased strength and occassionally dropping objects.  She reports fingers "lock up"  Patient will benefit from skilled OT intervention to address these issues and to help return functional use to Bilateral hands.    OT Occupational Profile and History Problem Focused Assessment - Including review of records relating to presenting problem    Occupational performance deficits (Please  refer to evaluation for details): ADL's;IADL's    Body Structure / Function / Physical Skills ADL;Decreased knowledge of use of DME;Strength;Dexterity;Pain;Tone;Body mechanics;UE functional use;IADL;ROM;Coordination;Sensation;Decreased knowledge of precautions;FMC    Rehab Potential Good    Clinical Decision Making Limited treatment options, no task modification necessary    Comorbidities Affecting Occupational Performance: May have comorbidities impacting occupational performance    Modification or Assistance to Complete Evaluation  No modification of tasks or assist necessary to complete eval    OT Frequency 1x / week    OT Duration 4 weeks    OT Treatment/Interventions Self-care/ADL training;Moist Heat;Fluidtherapy;DME and/or AE instruction;Splinting;Contrast Bath;Therapeutic activities;Therapeutic exercise;Ultrasound;Cryotherapy;Neuromuscular education;Passive range of motion;Patient/family education;Manual Therapy    Plan heat as needed for symptom relief - start stretching program. Need to establish HEP - Coordination, pain, edema, strength    Consulted and Agree with Plan of Care Patient             Patient will benefit from skilled therapeutic intervention in order to improve the following deficits and impairments:   Body Structure / Function / Physical Skills: ADL, Decreased knowledge of use of DME, Strength, Dexterity, Pain, Tone, Body mechanics, UE functional use, IADL, ROM, Coordination, Sensation, Decreased knowledge of precautions, [place]       Visit Diagnosis: Pain in joint of left hand - Plan: Ot plan of care cert/re-cert  Pain in joint of right hand - Plan: Ot plan of care cert/re-cert  Localized edema - Plan: Ot plan of care cert/re-cert  Stiffness of right hand, not elsewhere classified - Plan: Ot plan of care cert/re-cert  Stiffness of left hand, not elsewhere classified - Plan: Ot plan of care cert/re-cert    Problem List Patient Active Problem List    Diagnosis Date Noted   Bartholin cyst 09/10/2020   NAFLD (nonalcoholic fatty liver disease) 08/05/2020   H/O colonoscopy 06/16/2020   Hot flashes 04/27/2019   Bipolar disorder ([place]) 01/18/2019   Asthma 01/18/2019   Hyperlipidemia 07/14/2018   Tobacco abuse 11/29/2017   Benign paroxysmal positional vertigo 11/29/2017   Major depressive disorder, recurrent episode, moderate ([place]) 06/28/2009   GENITAL HERPES 01/17/2009   Migraine headache 01/17/2009   Essential hypertension, benign 01/17/2009   GERD (gastroesophageal reflux disease) 01/17/2009   Irritable bowel syndrome 01/17/2009   Heart murmur 01/17/2009    Mariah Milling 11/16/2020, 6:15 PM  [facility] [address] [place], [place], [address] Phone: [phone]   Fax:  [phone]  Name: Bailey Hooper MRN: [id number] Date of Birth: 02/12/1966

## 2020-11-17 ENCOUNTER — Telehealth: Payer: Self-pay | Admitting: Family Medicine

## 2020-11-17 NOTE — Telephone Encounter (Signed)
Received message from OT that patient has significant hand symptoms--previously recommended patient be seen for this. Called patient, agreeable to appointment.   Red Team- can you please call patient and help to schedule in person visit? She will needs labs (likely) and imaging.  Thanks, Dorris Singh, MD  [facility] Medicine Teaching Service

## 2020-11-17 NOTE — Telephone Encounter (Signed)
Appointment made.  .Katilin Raynes R Kierstin January, CMA  

## 2020-11-21 ENCOUNTER — Other Ambulatory Visit: Payer: Self-pay

## 2020-11-21 ENCOUNTER — Ambulatory Visit (HOSPITAL_COMMUNITY): Payer: 59 | Attending: Family Medicine

## 2020-11-21 ENCOUNTER — Encounter (HOSPITAL_COMMUNITY): Payer: Self-pay

## 2020-11-21 DIAGNOSIS — M25641 Stiffness of right hand, not elsewhere classified: Secondary | ICD-10-CM | POA: Diagnosis present

## 2020-11-21 DIAGNOSIS — M25642 Stiffness of left hand, not elsewhere classified: Secondary | ICD-10-CM | POA: Insufficient documentation

## 2020-11-21 DIAGNOSIS — M25542 Pain in joints of left hand: Secondary | ICD-10-CM | POA: Diagnosis not present

## 2020-11-21 DIAGNOSIS — M25541 Pain in joints of right hand: Secondary | ICD-10-CM | POA: Insufficient documentation

## 2020-11-21 DIAGNOSIS — R600 Localized edema: Secondary | ICD-10-CM | POA: Diagnosis present

## 2020-11-21 NOTE — Patient Instructions (Addendum)
Recommendations:   Hands will tend to be stiffer in the morning due to inactivity. Take it easy in the morning and gradually complete gentle ROM  movement such as open and closing of the fist.   Pain and swelling management:   Use compression gloves in both hands for pain management. Wear as needed (night and day).  2.  Massage wrist and forearm at the area of pain or soreness.  3.  Heat compress to hands/wrists for pain. Place for 10-15 minutes at a time.

## 2020-11-22 ENCOUNTER — Encounter (HOSPITAL_COMMUNITY): Payer: Self-pay

## 2020-11-22 NOTE — Therapy (Signed)
[facility] [address] [place], [place], [address] Phone: [phone]   Fax:  [phone]  Occupational Therapy Treatment  Patient Details  Name: Sarann Tregre MRN: [id number] Date of Birth: September 23, 1965 Referring Provider (OT): Dorris Singh   Encounter Date: 11/21/2020   OT End of Session - 11/22/20 0923     Visit Number 2    Number of Visits 5    Authorization Type [employer] [id number]    OT Start Time 1515   reasess/discharge   OT Stop Time 1553    OT Time Calculation (min) 38 min    Activity Tolerance Patient tolerated treatment well    Behavior During Therapy WFL for tasks assessed/performed             Past Medical History:  Diagnosis Date   Allergy    Anxiety    Arthritis    Asthma    Depression    Fibroids    GERD (gastroesophageal reflux disease)    Hiatal hernia    High cholesterol    Hypertension    IBS (irritable bowel syndrome)    Migraines    Prediabetes    Sleep apnea    cpap   Tobacco use    Vertigo     Past Surgical History:  Procedure Laterality Date   CESAREAN SECTION     COLONOSCOPY     FOOT SURGERY     Left foot    There were no vitals filed for this visit.   Subjective Assessment - 11/22/20 0849     Subjective  S: I just want my hands to get better and know what's going on.    Currently in Pain? No/denies               11/21/20 0001  Assessment  Medical Diagnosis pain in both hands  Precautions  Precautions None  Coordination  Gross Motor Movements are Fluid and Coordinated Yes  Fine Motor Movements are Fluid and Coordinated No  9 Hole Peg Test  (declined retest)  Hand Function  Right Hand Gross Grasp Functional  Right Hand Grip (lbs) 50 (previous: 30)  Left Hand Gross Grasp Functional  Left Hand Grip (lbs) 42 (previous: unknown. no baseline number recorded)                  OT Education - 11/22/20 0920     Education Details Discussed pain and edema  management related to arthritis (use of compression gloves. Provided one large exo soft glove and will order another one for the other hand. warm compress and massage to areas of pain). Awareness that there will be good and bad days and they will fluctuate. Make sure to adjust your daily activities depending on these factors. Ok to continue to use hand exciser although recommended decreasing frequency to once a day due to new onset of pain/soreness when gripping with the right hand.    Person(s) Educated Patient    Methods Explanation;Handout    Comprehension Verbalized understanding                 OT Long Term Goals - 11/21/20 1524       OT LONG TERM GOAL #1   Title Patient will complete an HEP designed to improve coordiantion in BUE    Time 4    Period Weeks    Status Achieved      OT LONG TERM GOAL #2   Title Patient will complete an HEP  designed to improve BUE hand strength    Time 4    Period Weeks    Status Achieved      OT LONG TERM GOAL #3   Title Patient will demonstrate understanding of pain and edema management techniques for Bilateral hands    Time 4    Period Weeks    Status Achieved      OT LONG TERM GOAL #4   Title Patient will demonstrate 3 lb increase in grip strength bilaterally    Time 4    Period Weeks    Status Achieved      OT LONG TERM GOAL #5   Title Patient will demonstrate 3 second reduction in 9 hole peg test bilaterally    Time 4    Period Weeks    Status Achieved                   Plan - 11/22/20 0924     Clinical Impression Statement A: Unknown cause of bilateral hand pain and edema with possibility of arthritis. Pain and swelling fluctuates and does appear to have any triggers although increased gripping and use seem to aggrevate. Today, patient demonstrates functional bilateral grip strength and coordination. Completed education regarding pain and edema management strategies and techniques while discussing the importance of  monitoring need for use daily. All therapy goals have been met today. Patient does require any additional follow up OT needs at this time. Patient plans to follow up with Dr. Owens Shark to complete bloodwork to determine if cause is arthritis.    Body Structure / Function / Physical Skills ADL;Decreased knowledge of use of DME;Strength;Dexterity;Pain;Tone;Body mechanics;UE functional use;IADL;ROM;Coordination;Sensation;Decreased knowledge of precautions;FMC    Plan P: Discharge from OT with all education complete. Continue to utilize pain and edema management techniques as needed. Follow up with Dr. Owens Shark at next scheduled appointment.    Consulted and Agree with Plan of Care Patient             Patient will benefit from skilled therapeutic intervention in order to improve the following deficits and impairments:   Body Structure / Function / Physical Skills: ADL, Decreased knowledge of use of DME, Strength, Dexterity, Pain, Tone, Body mechanics, UE functional use, IADL, ROM, Coordination, Sensation, Decreased knowledge of precautions, [facility]       Visit Diagnosis: Pain in joint of left hand  Pain in joint of right hand  Localized edema  Stiffness of left hand, not elsewhere classified  Stiffness of right hand, not elsewhere classified    Problem List Patient Active Problem List   Diagnosis Date Noted   Bartholin cyst 09/10/2020   NAFLD (nonalcoholic fatty liver disease) 08/05/2020   H/O colonoscopy 06/16/2020   Hot flashes 04/27/2019   Bipolar disorder ([place]) 01/18/2019   Asthma 01/18/2019   Hyperlipidemia 07/14/2018   Tobacco abuse 11/29/2017   Benign paroxysmal positional vertigo 11/29/2017   Major depressive disorder, recurrent episode, moderate ([place]) 06/28/2009   GENITAL HERPES 01/17/2009   Migraine headache 01/17/2009   Essential hypertension, benign 01/17/2009   GERD (gastroesophageal reflux disease) 01/17/2009   Irritable bowel syndrome 01/17/2009   Heart murmur  01/17/2009   OCCUPATIONAL THERAPY DISCHARGE SUMMARY  Visits from Start of Care: 2  Current functional level related to goals / functional outcomes: See above   Remaining deficits: See above   Education / Equipment: See above   Patient agrees to discharge. Patient goals were met. Patient is being discharged due to  meeting therapy goals and  being able to independently utilize techniques and strategies provided at today's session.Ailene Ravel, OTR/L,CBIS  [phone]  11/22/2020, 9:37 AM  [facility] [address] [place], [place], [address] Phone: [phone]   Fax:  [phone]  Name: Neddie Steedman MRN: [id number] Date of Birth: 1966/03/03

## 2020-11-28 ENCOUNTER — Encounter (HOSPITAL_COMMUNITY): Payer: 59

## 2020-12-05 ENCOUNTER — Encounter (HOSPITAL_COMMUNITY): Payer: 59

## 2020-12-09 ENCOUNTER — Ambulatory Visit
Admission: RE | Admit: 2020-12-09 | Discharge: 2020-12-09 | Disposition: A | Discharge status: Home / Self Care | Payer: 59 | Source: Ambulatory Visit | Attending: Family Medicine | Admitting: Family Medicine

## 2020-12-09 ENCOUNTER — Encounter: Payer: Self-pay | Admitting: Family Medicine

## 2020-12-09 ENCOUNTER — Ambulatory Visit (INDEPENDENT_AMBULATORY_CARE_PROVIDER_SITE_OTHER): Payer: 59 | Admitting: Family Medicine

## 2020-12-09 ENCOUNTER — Other Ambulatory Visit: Payer: Self-pay

## 2020-12-09 VITALS — BP 125/80 | HR 93 | Resp 98 | Wt 183.0 lb

## 2020-12-09 DIAGNOSIS — E782 Mixed hyperlipidemia: Secondary | ICD-10-CM

## 2020-12-09 DIAGNOSIS — K760 Fatty (change of) liver, not elsewhere classified: Secondary | ICD-10-CM

## 2020-12-09 DIAGNOSIS — E7800 Pure hypercholesterolemia, unspecified: Secondary | ICD-10-CM | POA: Diagnosis not present

## 2020-12-09 DIAGNOSIS — F319 Bipolar disorder, unspecified: Secondary | ICD-10-CM

## 2020-12-09 DIAGNOSIS — L509 Urticaria, unspecified: Secondary | ICD-10-CM

## 2020-12-09 DIAGNOSIS — M79641 Pain in right hand: Secondary | ICD-10-CM

## 2020-12-09 DIAGNOSIS — R110 Nausea: Secondary | ICD-10-CM

## 2020-12-09 DIAGNOSIS — M79642 Pain in left hand: Secondary | ICD-10-CM

## 2020-12-09 MED ORDER — LORATADINE 10 MG PO TABS: 10.0000 mg | ORAL_TABLET | Freq: Every day | ORAL | 11 refills | Status: AC

## 2020-12-09 NOTE — Assessment & Plan Note (Signed)
Direct LDL today given recent elevation in cholesterol.

## 2020-12-09 NOTE — Progress Notes (Signed)
SUBJECTIVE:   CHIEF COMPLAINT: Hand pain, leg pain and ongoing vertigo. HPI:   Bailey Hooper is a 55 y.o. yo with history notable for mood disorder followed by psychiatry, insomnia, mild asthma and hypertension presenting for checkup and follow-up from recent visit.   The patient has had ongoing postprandial vomiting for several years.  She is undergone oratory and ultrasound evaluation for this.  Ultrasound showed fatty liver disease.  She is followed by low-power gastroenterology but missed her appointment in December 2021 due to transportation issues.  She has had no weight loss, fevers hematemesis or melena but she does have ongoing postprandial vomiting.  She also has significant nausea when driving and riding in the car associated with vertigo.  The patient reports ongoing hand pain for about 5 months.  She has associated morning time stiffness for greater than 30 minutes, overlying erythema and edema.  She denies fevers.  She is had no weight loss but in fact weight gain.  She also reports bilateral ankle and anterior tibia pain that is nonspecific.  She denies prior trauma.  She is right-hand dominant.  She does endorse weakness and numbness.  She has a known history of carpal tunnel reports the symptoms are far different.  She has tried braces which are minimally helpful.  The patient is regularly seeing a therapist and psychiatrist.  The patient reports her allergy medications are not working nearly as well as it used to.  She wonders if she should rotate her antihistamine.  She endorses some mild nasal congestion and eye redness at times.  She does continue to smoke.  PERTINENT  PMH / PSH/Family/Social History : Updated and reviewed   OBJECTIVE:   BP 125/80   Pulse 93   Resp (!) 98   Wt 183 lb (83 kg)   BMI 29.54 kg/m   Today's weight:  Last Weight  Most recent update: 12/09/2020  2:47 PM    Weight  83 kg (183 lb)            Review of prior weights: Filed Weights    12/09/20 1446  Weight: 183 lb (83 kg)    Oropharynx is benign.  She has prominent circumvallate papilla.  Bilateral tympanic membranes are benign in appearance. Cardiac: Regular rate and rhythm. Normal S1/S2. No murmurs, rubs, or gallops appreciated. Lungs: Clear bilaterally to ascultation.  Abdomen: Normoactive bowel sounds. No tenderness to deep or light palpation. No rebound or guarding.   Psych: Pleasant and appropriate  Bilateral hands are examined.  She does have some degree of reduced grip strength.  Right greater than left.  She does have some mild edema primarily over her metacarpophalangeal joints as well as her proximal interphalangeal joints.  She has mild pain over the MCP and PIP bilaterally on digits 2 through 5 predominantly as well as feelings of synovitis on digits 2 and 3 bilaterally.  Bilateral lower extremities examined there is no edema.  She has mild discomfort with dorsiflexion but no evidence of trauma, erythema or edema.  There is no overlying skin ulceration dorsal pedal pulses are 2+.   ASSESSMENT/PLAN:   Bipolar disorder ([place]) Follows with psychiatry for this.  Updated medications today which include trazodone and mirtazapine.  Hyperlipidemia Direct LDL today given recent elevation in cholesterol.  NAFLD (nonalcoholic fatty liver disease) Hepatic panel and repeat ferritin today.  Interestingly her ferritin is slightly elevated which may be due to an inflammatory arthropathy given her hand symptoms could also consider although less  likely hemochromatosis.  We will repeat.  Referral to gastroenterology placed for persistent nausea and vomiting.    Bilateral Hand Pain along with some stiffness and morning stiffness, suspicious for erosive arthritis such as rheumatoid disease or less likely lupus.  Also considered osteoarthritis.  As such we will obtain CCP, rheumatoid factor, ANA and hand x-rays today.  Repeat ferritin was also obtained.  Discussed that many of  these test may be false positives and may not be meaningful without clinical correlation and confirmatory testing.  Discussed further evaluation should any of these test be positive.  Persistent Vertigo the patient has had persistent headaches and vertigo for many years.  She has been evaluated by ear nose and throat and neurology in the past without resolution.  Will discuss with referrals coordinator.  Referrals coordinator recommends the patient call [facility] and other practices to see who will accept her insurance.  We will send my chart message to patient.  Postprandial Vomiting no weight loss, dysphagia hematemesis or melena.  She was previously referred to gastroenterology but missed her appointment.  A new referral was placed today.  Recommended patient call to schedule and discussed importance of making her appointment at length.  She now has transportation arranged.  HCM Discussed but declined vaccines today.  Will address tobacco cessation at follow-up visit.  Congratulated patient on making lifestyle changes as well as trying to reduce stressors in life.   At the end of visit the patient reported tongue pain and hyperpigmentation chest erythema due to prior stickers.  I recommended we talk about this in detail at follow-up.   Dorris Singh, [place]

## 2020-12-09 NOTE — Patient Instructions (Addendum)
It was wonderful to see you today.  Please bring ALL of your medications with you to every visit.   Today we talked about:  --Going to the lab  - Do not panic when you see the results  - Go to [facility] imaging for X-rays  --I will call you next week with results   - You will be called by Gastroenterology   - I will see if I can send a referral to [place], [place]   Follow up in 3-6  months for a check in    Thank you for choosing [facility].   Please call [phone] with any questions about today's appointment.  Please be sure to schedule follow up at the front  desk before you leave today.   Dorris Singh, MD  Family Medicine

## 2020-12-09 NOTE — Assessment & Plan Note (Signed)
Follows with psychiatry for this.  Updated medications today which include trazodone and mirtazapine.

## 2020-12-09 NOTE — Assessment & Plan Note (Signed)
Hepatic panel and repeat ferritin today.  Interestingly her ferritin is slightly elevated which may be due to an inflammatory arthropathy given her hand symptoms could also consider although less likely hemochromatosis.  We will repeat.  Referral to gastroenterology placed for persistent nausea and vomiting.

## 2020-12-10 LAB — RHEUMATOID FACTOR: Rheumatoid fact SerPl-aCnc: <10 IU/mL (ref <14.0)

## 2020-12-10 LAB — HEPATIC FUNCTION PANEL
ALT: 24 IU/L (ref 0–32)
AST: 17 IU/L (ref 0–40)
Albumin: 5.2 g/dL — ABNORMAL HIGH (ref 3.8–4.9)
Alkaline Phosphatase: 159 IU/L — ABNORMAL HIGH (ref 44–121)
Bilirubin Total: 0.3 mg/dL (ref 0.0–1.2)
Bilirubin, Direct: <0.1 mg/dL (ref 0.00–0.40)
Total Protein: 8.1 g/dL (ref 6.0–8.5)

## 2020-12-10 LAB — FERRITIN: Ferritin: 244 ng/mL — ABNORMAL HIGH (ref 15–150)

## 2020-12-10 LAB — VITAMIN B12: Vitamin B-12: 633 pg/mL (ref 232–1245)

## 2020-12-10 LAB — LDL CHOLESTEROL, DIRECT: LDL Direct: 128 mg/dL — ABNORMAL HIGH (ref 0–99)

## 2020-12-12 ENCOUNTER — Encounter: Payer: Self-pay | Admitting: Family Medicine

## 2020-12-13 ENCOUNTER — Telehealth: Payer: Self-pay | Admitting: Family Medicine

## 2020-12-13 NOTE — Telephone Encounter (Signed)
Attempted to call patient. Phone busy then would not ring.   If calls back - B12 level is normal -Ferritin (iron level) is elevated. This could be due to her liver disease. I recommend follow up with Gastroenterology for this. She missed her visit in December with them. She will need tocall Please provide number to GI.  - Her metoprolol is for blood pressure. ASCVD is 10.5%, will discuss statin.  - A negative rheumatoid factor is a good thing.  - Her liver tests look overall okay---I do recommend follow up with Gastroenterology.  Will send message via mychart and call again tomorrow as my schedule allows.  Dorris Singh, MD  Family Medicine Teaching Service

## 2020-12-14 ENCOUNTER — Telehealth: Payer: Self-pay | Admitting: Family Medicine

## 2020-12-14 DIAGNOSIS — R200 Anesthesia of skin: Secondary | ICD-10-CM

## 2020-12-14 NOTE — Telephone Encounter (Signed)
Called patient with results. Hand x-rays without erosions or signs of inflammatory arthropathy.  Reviewed elevated ferritin levels, this could be as a result of her nonalcoholic fatty liver disease or possibly related to hemochromatosis which I think is less likely but certainly possible.  Recommended she follow-up with gastroenterology.  She has the number to call and a prior referral has been placed.  Hepatitis C is negative.  She is hepatitis B immune.  We discussed elevated cholesterol and recommendations to start a statin.  She would very much like to avoid avoid excess medication and is very interested in smoking cessation.  Counseling provided.  We will continue to address and encourage.  Repeat in 3 months.  Referral placed to hand surgery as I suspect the cause of her bilateral hand intermittent weakness and numbness is related to carpal tunnel.  She is already using night splints.  We will await the rest of her labs for further recommendations.

## 2020-12-16 ENCOUNTER — Encounter: Payer: Self-pay | Admitting: Family Medicine

## 2020-12-16 DIAGNOSIS — F319 Bipolar disorder, unspecified: Secondary | ICD-10-CM

## 2020-12-20 ENCOUNTER — Encounter: Payer: Self-pay | Admitting: Orthopedic Surgery

## 2020-12-20 ENCOUNTER — Ambulatory Visit: Payer: 59 | Admitting: Orthopedic Surgery

## 2020-12-20 DIAGNOSIS — R200 Anesthesia of skin: Secondary | ICD-10-CM | POA: Insufficient documentation

## 2020-12-20 NOTE — Progress Notes (Signed)
Office Visit Note   Patient: Bailey Hooper           Date of Birth: 25-Dec-1965           MRN: [id number] Visit Date: 12/20/2020              Requested by: Martyn Malay, MD [address] [place],  [place] [address] PCP: Martyn Malay, MD   Assessment & Plan: Visit Diagnoses:  1. Bilateral hand numbness     Plan: We discussed the nature and prognosis of likely carpal tunnel syndrome on both sides.  We discussed the utility of electrodiagnostic studies to determine the location and degree of nerve compression.  We also discussed both operative and nonoperative treatment options for carpal tunnel syndrome.  At this point, she wants to try night splints.  She is not interested in nerve conduction studies at this time.  We also discussed treatment options for diffuse osteoarthritis of the hand.  She has pain in multiple joints of both hands particularly PIP joints on the right side that is worse in the morning.  She is taking naproxen which has significantly helped the swelling in these joints.  She continues continue to take naproxen as needed.  We can see her back in the office in 4 to 6 weeks after she is tried a night splint to see if this is provided any symptom relief.  Follow-Up Instructions: No follow-ups on file.   Orders:  No orders of the defined types were placed in this encounter.  No orders of the defined types were placed in this encounter.     Procedures: No procedures performed   Clinical Data: No additional findings.   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 Was sent to [facility] and was told  they could not do anything for her as there was not enough info to do anything,   Left Hand - Numbness, Weakness    Risk Factors Diabetes: Neg Hypothyroid: Neg C-spine: Neg Inflammatory: Neg; mother reportedly has RA History of trauma: Neg  Electrodiagnostic studies: No  Treatment to date: None  This is a 55 year old right-hand-dominant female who presents with bilateral hand numbness and tingling for several months.  The right is worse than the left.  She describes numbness involving the entire hand.  She denies any numbness today.  She describes nocturnal symptoms in which she wakes up and has to shake her hands.  She also describes some clumsiness with her hands.  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  She also describes swelling and stiffness in bilateral hands mostly at the MP and PIP joints.  She has taken naproxen recently which has significantly helped with the swelling to the point where it is nearly resolved.  She never had any injury to these hands.  She wears compression gloves bilaterally which she thinks helps.  Her pain previously was 7 out of 10 at worst but has improved since starting naproxen.  Weakness Associated symptoms include joint swelling, numbness and weakness.   Review of Systems  Constitutional: Negative.   Respiratory: Negative.    Cardiovascular: Negative.   Musculoskeletal:  Positive for joint swelling.  Skin: Negative.   Neurological:  Positive for weakness and numbness.    Objective: Vital Signs: There were no vitals taken for this visit.  Physical Exam Constitutional:      Appearance: Normal appearance.  Cardiovascular:     Rate and Rhythm: Normal rate.     Pulses: Normal pulses.  Pulmonary:     Effort: Pulmonary effort is normal.  Skin:    General: Skin is warm and dry.     Capillary Refill: Capillary refill takes 2 to 3 seconds.  Neurological:     Mental Status: She is alert and  oriented to person, place, and time.    Right Hand Exam   Tenderness  The patient is experiencing no tenderness.   Range of Motion  The patient has normal right wrist ROM.   Muscle Strength  Wrist extension: 5/5  Wrist flexion: 5/5   Other  Erythema: absent Sensation: decreased Pulse: present  Comments:  Thenar atrophy: Neg Tinel sign at wrist: Positive Carpal tunnel compression: Negative Phalen test: Positive  Negative Tinel at elbow  Sensory 2-point discrimination (thumb, index, middle, small) Right hand: 84m thumb and IF, 844mSF  Motor [employer], FDP (2-5): 5/5  Cervical Spine: ROM is normal     Left Hand Exam   Comments:  Thenar atrophy: Neg Tinel sign at wrist: Positive Carpal tunnel compression: Negative Phalen test: Positive  Negative Tinel at elbow  Motor [employer], FDP (2-5): 5/5  Cervical Spine: ROM is normal     Specialty Comments:  No specialty comments available.  Imaging: Prior imaging of bilateral hands reviewed interpreted by me today.  They demonstrate scattered degenerative changes involving multiple joints.  This is mostly at the PIP joints of both hands.  She has no degenerative changes at the radiocarpal joint.  No degenerative changes at the thumb [facility].   PMFS History: Patient Active Problem List   Diagnosis Date Noted   Bilateral hand numbness 12/20/2020   Bartholin cyst 09/10/2020   NAFLD (nonalcoholic fatty liver disease) 08/05/2020   H/O colonoscopy 06/16/2020   Hot flashes 04/27/2019   Bipolar disorder ([place]/02/2019   Asthma 01/18/2019   Hyperlipidemia 07/14/2018   Tobacco abuse 11/29/2017   Benign paroxysmal positional vertigo 11/29/2017   Major depressive disorder, recurrent episode, moderate ([facility]03/22/2011   GENITAL HERPES 01/17/2009   Migraine headache 01/17/2009   Essential hypertension, benign 01/17/2009   GERD (gastroesophageal reflux disease) 01/17/2009   Irritable bowel syndrome 01/17/2009   Heart  murmur 01/17/2009   Past Medical History:  Diagnosis Date   Allergy    Anxiety    Arthritis    Asthma    Depression    Fibroids    GERD (gastroesophageal reflux disease)    Hiatal hernia    High cholesterol    Hypertension    IBS (irritable bowel syndrome)    Migraines    Prediabetes    Sleep apnea    cpap   Tobacco use    Vertigo     Family History  Problem Relation Age of Onset   Colon cancer Other        Father   Cancer Other        Oral, uncle   Alcohol abuse Other        Father   Drug abuse Other        Father   Osteoporosis  Other    Depression Other    Hypertension Other    Diabetes Other    Heart disease Other        Grandmother   Colon cancer Father    Heart disease Father    Diabetes Father    Hypertension Father    Dementia Father    Heart disease Maternal Grandmother    Diabetes Maternal Grandmother    Diabetes Mother    Depression Mother    Hypertension Mother    Esophageal cancer Neg Hx    Stomach cancer Neg Hx    Rectal cancer Neg Hx     Past Surgical History:  Procedure Laterality Date   CESAREAN SECTION     COLONOSCOPY     FOOT SURGERY     Left foot   Social History   Occupational History   Not on file  Tobacco Use   Smoking status: Every Day    Packs/day: 0.25    Years: 20.00    Pack years: 5.00    Types: Cigarettes    Start date: 04/09/1986   Smokeless tobacco: Never   Tobacco comments:    Previous 0.5 PPD. trying to quitt. Does not smoke when sick  Vaping Use   Vaping Use: Never used  Substance and Sexual Activity   Alcohol use: Yes    Alcohol/week: 0.0 standard drinks    Comment: rarely   Drug use: No   Sexual activity: Yes    Partners: Male

## 2020-12-21 LAB — ANA W/REFLEX: ANA Titer 1: NEGATIVE

## 2020-12-21 LAB — ANTI-CCP AB, IGG + IGA (RDL): Anti-CCP Ab, IgG + IgA (RDL): <20 Units (ref <20)

## 2020-12-27 ENCOUNTER — Encounter: Payer: Self-pay | Admitting: Gastroenterology

## 2021-01-12 ENCOUNTER — Ambulatory Visit (INDEPENDENT_AMBULATORY_CARE_PROVIDER_SITE_OTHER): Payer: 59 | Admitting: Family Medicine

## 2021-01-12 ENCOUNTER — Ambulatory Visit: Payer: 59 | Admitting: Nurse Practitioner

## 2021-01-12 ENCOUNTER — Encounter: Payer: Self-pay | Admitting: Nurse Practitioner

## 2021-01-12 ENCOUNTER — Telehealth: Payer: Self-pay | Admitting: Family Medicine

## 2021-01-12 ENCOUNTER — Other Ambulatory Visit (INDEPENDENT_AMBULATORY_CARE_PROVIDER_SITE_OTHER): Payer: 59

## 2021-01-12 ENCOUNTER — Other Ambulatory Visit: Payer: Self-pay

## 2021-01-12 VITALS — BP 152/90 | HR 76 | Ht 66.0 in | Wt 188.1 lb

## 2021-01-12 VITALS — BP 179/102 | HR 70 | Wt 188.0 lb

## 2021-01-12 DIAGNOSIS — R6881 Early satiety: Secondary | ICD-10-CM | POA: Diagnosis not present

## 2021-01-12 DIAGNOSIS — R030 Elevated blood-pressure reading, without diagnosis of hypertension: Secondary | ICD-10-CM | POA: Diagnosis not present

## 2021-01-12 DIAGNOSIS — R748 Abnormal levels of other serum enzymes: Secondary | ICD-10-CM

## 2021-01-12 DIAGNOSIS — K760 Fatty (change of) liver, not elsewhere classified: Secondary | ICD-10-CM

## 2021-01-12 DIAGNOSIS — F419 Anxiety disorder, unspecified: Secondary | ICD-10-CM

## 2021-01-12 DIAGNOSIS — L989 Disorder of the skin and subcutaneous tissue, unspecified: Secondary | ICD-10-CM | POA: Diagnosis not present

## 2021-01-12 DIAGNOSIS — R112 Nausea with vomiting, unspecified: Secondary | ICD-10-CM

## 2021-01-12 DIAGNOSIS — L219 Seborrheic dermatitis, unspecified: Secondary | ICD-10-CM | POA: Diagnosis not present

## 2021-01-12 LAB — IBC PANEL
Iron: 66 ug/dL (ref 42–145)
Saturation Ratios: 22.1 % (ref 20.0–50.0)
TIBC: 298.2 ug/dL (ref 250.0–450.0)
Transferrin: 213 mg/dL (ref 212.0–360.0)

## 2021-01-12 LAB — HEPATIC FUNCTION PANEL
ALT: 21 U/L (ref 0–35)
AST: 16 U/L (ref 0–37)
Albumin: 4.6 g/dL (ref 3.5–5.2)
Alkaline Phosphatase: 124 U/L — ABNORMAL HIGH (ref 39–117)
Bilirubin, Direct: 0.1 mg/dL (ref 0.0–0.3)
Total Bilirubin: 0.3 mg/dL (ref 0.2–1.2)
Total Protein: 8.2 g/dL (ref 6.0–8.3)

## 2021-01-12 LAB — GAMMA GT: GGT: 28 U/L (ref 7–51)

## 2021-01-12 MED ORDER — ONDANSETRON 4 MG PO TBDP: ORAL_TABLET | ORAL | 0 refills | Status: DC

## 2021-01-12 MED ORDER — FLUOCINONIDE 0.05 % EX SOLN: 1.0000 "application " | Freq: Two times a day (BID) | CUTANEOUS | 2 refills | Status: DC

## 2021-01-12 NOTE — Progress Notes (Signed)
01/12/2021 Bailey Hooper [id number] 08/27/1965   Chief Complaint:  Nausea/ vomiting, stomach churning and bad breath  History of Present Illness: Bailey Hooper is a 55 year old female with a past medical history of anxiety, depression, hypertension, migraine headaches, prediabetes, sleep apnea, fatty liver, GERD and IBS.  She is followed by Dr. Silverio Decamp.  She presents to the office today with complaints of having nausea after eating and vomits partially digested food twice weekly.  No hematemesis or coffee-ground emesis.  She awakens in the morning and feels acid churning discomfort.  She sips water until her stomach calms down which usually takes about an hour then she is able to take her routine medications and eats breakfast.  She feels full more easily and she also becomes nauseous more easily.  No heartburn or dysphagia.  She is taking Lansoprazole 30 mg daily.  She is concerned regarding her chronic bad breath.  She had  dental issues and recently had her upper teeth removed. She was hoping her bad breath would improve after the dental extraction surgery but she did not assess any improvement.  She has constipation.  She is taking senna natural laxative 1 tab 2-3 times weekly which results in passing 1 or 2 soft to loose bowel movements daily.  No rectal bleeding or black stools.  She underwent an EGD by Dr. Bryan Lemma 06/15/2020 which identified a normal esophagus which was dilated and a normal stomach and duodenum.  Biopsies were negative for H. pylori and celiac disease.  She underwent a colonoscopy on the same date which identified 3 polyps which were removed from the sigmoid colon and six 1 to 3 mm hyperplastic polyps were removed from the rectum and rectosigmoid colon.  Diverticulosis in the ascending colon and internal hemorrhoids were noted.  Laboratory studies done 12/09/2020 showed an elevated alk phos level 159 with normal total bili level 0.3, AST 17 and ALT 24.  She underwent RUQ sonogram  08/03/2020 which showed echogenicity in the liver suggestive of hepatic steatosis and a normal gallbladder.  He complains of having bilateral hand pain with numbness under evaluation by Ortho.  She is taking Naproxen as needed.  She stated her PCP Dr. Owens Shark also wanted her to follow-up in our office due to having an elevated ferritin level with concerns of possible iron overload or hemochromatosis.  No known family history of liver disease.  Past Medical History:  Diagnosis Date   Allergy    Anxiety    Arthritis    Asthma    Depression    Fibroids    GERD (gastroesophageal reflux disease)    Hiatal hernia    High cholesterol    Hypertension    IBS (irritable bowel syndrome)    Migraines    Prediabetes    Sleep apnea    cpap   Tobacco use    Vertigo    Past Surgical History:  Procedure Laterality Date   CESAREAN SECTION     COLONOSCOPY     FOOT SURGERY     Left foot  . CBC Latest Ref Rng & Units 02/01/2020 02/23/2019 06/30/2012  WBC 3.4 - 10.8 x10E3/uL 6.8 7.0 9.6  Hemoglobin 11.1 - 15.9 g/dL 12.8 12.8 13.9  Hematocrit 34.0 - 46.6 % 40.6 40.4 41.7  Platelets 150 - 450 x10E3/uL 337 353 344    CMP Latest Ref Rng & Units 12/09/2020 09/07/2020 02/01/2020  Glucose 65 - 99 mg/dL - 95 -  BUN 6 - 24 mg/dL - 13 -  Creatinine 0.57 - 1.00 mg/dL - 0.63 -  Sodium 134 - 144 mmol/L - 142 -  Potassium 3.5 - 5.2 mmol/L - 3.8 -  Chloride 96 - 106 mmol/L - 103 -  CO2 20 - 29 mmol/L - 23 -  Calcium 8.7 - 10.2 mg/dL - 9.0 -  Total Protein 6.0 - 8.5 g/dL 8.1 7.1 7.2  Total Bilirubin 0.0 - 1.2 mg/dL 0.3 <0.2 0.3  Alkaline Phos 44 - 121 IU/L 159(H) 120 107  AST 0 - 40 IU/L _0 ALT 0 - 32 IU/L _1 Hepatitis B surface antigen negative, hepatitis B surface antibody reactive and hepatitis B core total antibody negative on 09/07/2020  RUQ sonogram 08/03/2020: No gallstones or wall thickening visualized. No sonographic Murphy sign noted by sonographer. The echogenicity of the liver is  increased. This is a nonspecific finding but is most commonly seen with fatty infiltration of the liver. There are no obvious focal liver lesions.  EGD 06/15/2020: - Normal esophagus. Dilated. - Z-line regular, 40 cm from the incisors. - Normal stomach. Biopsied. - Normal examined duodenum. Biopsied.  Colonoscopy 06/15/2020: - Preparation of the colon was fair. - Three 3 to 4 mm polyps in the sigmoid colon, removed with a cold snare. Resected and retrieved. - Multiple 1 to 3 mm polyps in the rectum and at the recto-sigmoid colon, removed with a cold biopsy forceps. Resected and retrieved. - Stool in the entire examined colon. - Normal mucosa in the entire examined colon. Biopsied. - Diverticulosis in the ascending colon. - Non-bleeding internal hemorrhoids. Biopsy results: 1. Surgical [P], duodenal biopsies - MILD NONSPECIFIC DUODENITIS. - NO FEATURES OF CELIAC SPRUE OR GRANULOMAS. 2. Surgical [P], gastric biopsies - ANTRAL AND OXYNTIC MUCOSA WITH SLIGHT CHRONIC INFLAMMATION. [place] NEGATIVE FOR HELICOBACTER PYLORI. - NO INTESTINAL METAPLASIA, DYSPLASIA OR CARCINOMA. 3. Surgical [P], right colon biopsies - UNREMARKABLE COLONIC MUCOSA. - NO MICROSCOPIC COLITIS, ACTIVE INFLAMMATION OR CHRONIC CHANGES. 4. Surgical [P], left colon biopsies - UNREMARKABLE COLONIC MUCOSA. - NO MICROSCOPIC COLITIS, ACTIVE INFLAMMATION OR CHRONIC CHANGES. 5. Surgical [P], colon, sigmoid, polyp (3) - HYPERPLASTIC POLYP(S). - NO ADENOMATOUS CHANGE OR CARCINOMA. 6. Surgical [P], colon, rectum, polyp (6) - HYPERPLASTIC POLYP(S). - NO ADENOMATOUS CHANGE OR CARCINOMA.  Current Outpatient Medications on File Prior to Visit  Medication Sig Dispense Refill   albuterol (VENTOLIN HFA) 108 (90 Base) MCG/ACT inhaler Inhale 2 puffs into the lungs every 6 (six) hours as needed for wheezing. 18 g 2   AMBULATORY NON FORMULARY MEDICATION GI Cock tail: 90 ml viscous lidocaine, 90 ml 10/mg/ 5 ml dicyclomine,  270 ml maalox 450 mL 0   amLODipine (NORVASC) 10 MG tablet Take 1 tablet (10 mg total) by mouth daily. 90 tablet 3   azelastine (OPTIVAR) 0.05 % ophthalmic solution Place 1 drop into both eyes 2 (two) times daily. 6 mL 12   dicyclomine (BENTYL) 20 MG tablet 1/2 to 1 tab 3 times daily before meals and at bedtime 90 tablet 1   gabapentin (NEURONTIN) 300 MG capsule Take 1 capsule by mouth as needed.     hydrOXYzine (ATARAX/VISTARIL) 25 MG tablet Take 1/2 to one tab daily for anxiety and sleep 90 tablet 1   lansoprazole (PREVACID) 30 MG capsule Take 1 capsule (30 mg total) by mouth daily at 12 noon. 90 capsule 3   loratadine (CLARITIN) 10 MG tablet Take 1 tablet (10 mg total) by mouth daily. 30 tablet 11   metoprolol succinate (TOPROL-XL) 25  MG 24 hr tablet Take 1 tablet (25 mg total) by mouth at bedtime. Take with or immediately following a meal. 90 tablet 3   mirtazapine (REMERON) 45 MG tablet Take 45 mg by mouth at bedtime.     Misc. Devices (SITZ BATH) MISC 1 Device by Does not apply route 2 (two) times daily. 1 each 0   mometasone (NASONEX) 50 MCG/ACT nasal spray 2 sprays each nostril daily 17 g 12   montelukast (SINGULAIR) 10 MG tablet Take 1 tablet (10 mg total) by mouth at bedtime. 30 tablet 11   Multiple Minerals-Vitamins (CAL MAG ZINC +D3) TABS Take 1 tablet by mouth daily.     Multiple Vitamin (MULTIVITAMIN) capsule Take 1 capsule by mouth daily.     propranolol (INDERAL) 20 MG tablet Take 20 mg by mouth 2 (two) times daily.     SUMAtriptan (IMITREX) 50 MG tablet May repeat in 2 hours if headache persists or recurs with max of 100 mg in 24 hours. 9 tablet 11   traZODone (DESYREL) 50 MG tablet Take 0.5-1 tablets (25-50 mg total) by mouth at bedtime as needed for sleep. 30 tablet 0   No current facility-administered medications on file prior to visit.   Allergies  Allergen Reactions   Lisinopril Palpitations and Cough    Per pt report   Lamictal [Lamotrigine] Hives   Penicillins Rash     Vesicles, denies SOB or breathing issues   Erythromycin Base    Erythromycin Nausea And Vomiting   Current Medications, Allergies, Past Medical History, Past Surgical History, Family History and Social History were reviewed in [employer] record.  Review of Systems:   Constitutional: Negative for fever, sweats, chills or weight loss.  Respiratory: Negative for shortness of breath.   Cardiovascular: Negative for chest pain, palpitations and leg swelling.  Gastrointestinal: See HPI.  Musculoskeletal: Negative for back pain or muscle aches.  Neurological: Negative for dizziness, headaches or paresthesias.   Physical Exam: BP (!) 152/90 (BP Location: Left Arm, Patient Position: Sitting, Cuff Size: Normal)   Pulse 76   Ht _0  (1.676 m)   Wt 188 lb 2 oz (85.3 kg)   BMI 30.36 kg/m  General: 55 year old female no acute distress Head: Normocephalic and atraumatic. Eyes: No scleral icterus. Conjunctiva pink . Ears: Normal auditory acuity. Mouth: Upper dentures.  No ulcers or lesions.  Lungs: Clear throughout to auscultation. Heart: Regular rate and rhythm, no murmur. Abdomen: Soft, nontender and nondistended. No masses or hepatomegaly. Normal bowel sounds x 4 quadrants.  Rectal: Deferred. Musculoskeletal: Symmetrical with no gross deformities. Extremities: No edema. Neurological: Alert oriented x 4. No focal deficits.  Psychological: Alert and cooperative. Normal mood and affect  Assessment and Recommendations:  48) 55 year old female with history of GERD presents with fairly constant nausea, intermittent vomiting, halitosis, early satiety and upper abdominal pain.  EGD 06/2020 was normal. -Gastric empty study to rule out gastroparesis -Zofran 4 mg ODT every 6 hours as needed -Recommended eating 3-4 small snacks sized meals daily -Consider CCK HIDA scan if gastric emptying study -Continue Lansoprazole 30 mg p.o. daily -Further follow-up to be determined  after the above evaluation completed  2) Isolated elevated alk phos level with normal total bili, AST and ALT levels. RUQ sono 07/2020 showed evidence of hepatic steatosis and a normal gallbladder. -Alk phos isoenzymes, hepatic panel and GGT level  3) Hepatic steatosis per sonogram 07/2020.  Normal AST and ALT levels. -Patient advised to eat a low carbohydrate diet,  exercise as tolerated and to lose 10 pounds to reduce her risk of developing fatty liver disease -Recommended checking LFTs every 6 months for 1 year if her labs remain normal then annually with her PCP  4) Elevated ferritin level, likely due to inflammatory process  -Check iron panel   5) History of hyperplastic polyps per colonoscopy 06/2020 which also showed a fair prep therefore a repeat colonoscopy in 6 to 12 months with a 2-day was recommended -To schedule colonoscopy with a 2-day bowel prep after nausea/vomiting symptoms improve

## 2021-01-12 NOTE — Progress Notes (Signed)
    SUBJECTIVE:   CHIEF COMPLAINT / HPI:   Rash on chest - no previous PCP note on chart review - no photos of skin rash under media tab  Seborrheic dermatitis  Scaling behind ears - last prescribed  -  From PCP 12/16/2013 Patient with classic findings of seborrheic dermatitis. Has not responded to previous home treatments and possibly to ketoconazole shampoo. Will give trial of fluocinolone shampoo. If not improving patient is to follow-up with her PCP.  Message to PCP 10/25/20 "Also my face chest and behind my ears is broke out with bumps. I went to a dermatologist in [place]. They claim to not see the bumps so I'm asking if the e dermatologist there will see me please."  PERTINENT  PMH / PSH: HTN, migraine, GERD, asthma, BPPV, NAFLD, MDD, Bartholin cyst  OBJECTIVE:   There were no vitals taken for this visit.    ASSESSMENT/PLAN:   No problem-specific Assessment & Plan notes found for this encounter.     Ezequiel Essex, MD [place]

## 2021-01-12 NOTE — Patient Instructions (Signed)
It was wonderful to meet you today. Thank you for allowing me to be a part of your care. Below is a short summary of what we discussed at your visit today:  Seborrheic dermatitis - Apply the fluocinonide solution twice daily to the affected areas of your scalp - Follow-up with your PCP in 1 month  Fatty liver -We have attached some information to this paperwork for you - Follow-up with your PCP for further discussion    If you have any questions or concerns, please do not hesitate to contact us via phone or MyChart message.   Ezequiel Essex, MD

## 2021-01-12 NOTE — Patient Instructions (Signed)
IMAGING: You will be contacted by [facility] (Your caller ID will indicate phone # [phone]) in the next 7 days to schedule your Gastric Emptying Test. If you have not heard from them within 7 business days, please call [facility] at [phone] to follow up on the status of your appointment.    LABS:  Lab work has been ordered for you today. Our lab is located in the basement. Press "B" on the elevator. The lab is located at the first door on the left as you exit the elevator.  HEALTHCARE LAWS AND MY CHART RESULTS: Due to recent changes in healthcare laws, you may see the results of your imaging and laboratory studies on MyChart before your provider has had a chance to review them.   We understand that in some cases there may be results that are confusing or concerning to you. Not all laboratory results come back in the same time frame and the provider may be waiting for multiple results in order to interpret others.  Please give [place] 48 hours in order for your provider to thoroughly review all the results before contacting the office for clarification of your results.   MEDICATION: We have sent the following medication to your pharmacy for you to pick up at your convenience: Ondansetron (ZOFRAN ODT) 4 MG disintegrating tablet- Place one tablet under the tongue every 6 hours as needed for nausea or vomiting.  RECOMMENDATIONS: Eat 3-4 small snack size meals.  It was great seeing you today! Thank you for entrusting me with your care and choosing [facility].  Noralyn Pick, CRNP  The [facility] providers would like to encourage you to use [facility] to communicate with providers for non-urgent requests or questions.  Due to long hold times on the telephone, sending your provider a message by [facility] may be faster and more efficient way to get a response. Please allow 48 business hours for a response.  Please remember that this is  for non-urgent requests/questions. If you are age 74 or older, your body mass index should be between 23-30. Your Body mass index is 30.36 kg/m. If this is out of the aforementioned range listed, please consider follow up with your Primary Care Provider.  If you are age 55 or younger, your body mass index should be between 19-25. Your Body mass index is 30.36 kg/m. If this is out of the aformentioned range listed, please consider follow up with your Primary Care Provider.

## 2021-01-12 NOTE — Telephone Encounter (Signed)
Called patient back. Discussed GI visit and current stressors. Would like podiatry referral at follow up. All questions answered.  Dorris Singh, MD  Family Medicine Teaching Service

## 2021-01-12 NOTE — Telephone Encounter (Signed)
Bailey Hooper came for an appt  with Derm clinic, thought you had called her but didn't leave a message.  Ph # [phone]

## 2021-01-13 NOTE — Progress Notes (Addendum)
HPI  Anxiety:  The patient is here for skin conditions. However, upon her arrival, she was tearful regarding a test report she had received prior to this visit. Her GI specialist informed her that she has fatty liver from her U/S report done six months ago. She worries this is dangerous.  Rash:  She also c/o a recurrent rash on her chest, which is currently not present.  Scalp lesion:  C/O a scaly lesion on her scalp with hair loss for many years. She was prescribed a shampoo and a solution about five years ago which helped. Lately, she has been using an OTC regimen with no improvement. She is here for f/u.  HTN:  BP elevated. She is compliant with her meds.  Exam: Gen: Teary and very anxious HEENT: [place]/AT, dry whitish, scaly lesion on her scalp with no noticeable hair loss. Skin: no lesion observed on her chest.  Assessment/Plan  Test result anxiety: I spent about 15 minutes counseling her on fatty liver, including the disease course and management. Diet and exercise counseling are provided for weight loss. I advised that she discusses a nutritionist referral with her PCP.  Seborrheic dermatitis: We reviewed her old record. She had been on Ketoconazole shampoo and steroid solution in the past. We escribed Fluocinonide solution for now. May add Ketoconazole shampoo in the future if there is no improvement. F/U as needed.  Chest rash: No obvious rash today. She was instructed to come in when there is a rash or take a picture. Monitor for now.  HTN: BP elevated due to test result anxiety. Repeat BP by Dr. Jeani Hawking was still elevated. She is advised to f/u soon with her PCP for reassessment and medication management. I discussed this with her PCP to reach out and will also route this message to PCP to f/u with the patient to schedule an earlier appointment for BP management.  More than 50% of this 45 face to face encounter was spent on record review, counseling and coordination of  care.

## 2021-01-13 NOTE — Progress Notes (Signed)
Reviewed and agree with documentation and assessment and plan. K. Veena Raegyn Renda , MD   

## 2021-01-18 LAB — ALKALINE PHOSPHATASE, ISOENZYMES
Alkaline Phosphatase: 142 IU/L — ABNORMAL HIGH (ref 44–121)
BONE FRACTION: 54 % (ref 14–68)
INTESTINAL FRAC.: 3 % (ref 0–18)
LIVER FRACTION: 43 % (ref 18–85)

## 2021-01-26 ENCOUNTER — Encounter: Payer: Self-pay | Admitting: Family Medicine

## 2021-01-26 ENCOUNTER — Other Ambulatory Visit: Payer: Self-pay

## 2021-01-26 ENCOUNTER — Telehealth: Payer: Self-pay

## 2021-01-26 NOTE — Telephone Encounter (Signed)
Patient called back to ask if the exam can be done\sent to [facility] instead of [facility]. Please advise.

## 2021-01-26 NOTE — Telephone Encounter (Signed)
Sent patient MyChart message with answer to GES scheduling

## 2021-01-26 NOTE — Telephone Encounter (Signed)
Called patient back, and she is on hold on the other line with the hospital, trying to get her GES scheduled. I reviewed her lab results form 01/12/21 with her and answered her questions

## 2021-01-31 ENCOUNTER — Encounter: Payer: Self-pay | Admitting: Family Medicine

## 2021-01-31 ENCOUNTER — Telehealth: Payer: Self-pay | Admitting: Nurse Practitioner

## 2021-01-31 NOTE — Telephone Encounter (Signed)
Beth, please contact the patient to schedule a colonoscopy with Dr. Silverio Decamp with a 2-day bowel prep if her nausea/vomiting symptoms have abated and she thinks she can tolerate the bowel prep for this procedure.  Refer to office visit 01/12/2021.  History of hyperplastic polyps per colonoscopy 06/2020 which also showed a fair prep therefore a repeat colonoscopy in 6 to 12 months with a 2-day was recommended To schedule colonoscopy with a 2-day bowel prep after nausea/vomiting symptoms improve

## 2021-02-01 NOTE — Telephone Encounter (Signed)
Colleen, I noticed the gastric emptying scan has not been scheduled. Do you want this done first?

## 2021-02-03 ENCOUNTER — Ambulatory Visit
Admission: RE | Admit: 2021-02-03 | Discharge: 2021-02-03 | Disposition: A | Discharge status: Home / Self Care | Payer: 59 | Source: Ambulatory Visit | Attending: Family Medicine | Admitting: Family Medicine

## 2021-02-03 ENCOUNTER — Other Ambulatory Visit: Payer: Self-pay

## 2021-02-03 ENCOUNTER — Ambulatory Visit (INDEPENDENT_AMBULATORY_CARE_PROVIDER_SITE_OTHER): Payer: 59 | Admitting: Family Medicine

## 2021-02-03 ENCOUNTER — Encounter: Payer: Self-pay | Admitting: Family Medicine

## 2021-02-03 VITALS — BP 146/103 | HR 79 | Wt 185.0 lb

## 2021-02-03 DIAGNOSIS — R051 Acute cough: Secondary | ICD-10-CM

## 2021-02-03 DIAGNOSIS — E782 Mixed hyperlipidemia: Secondary | ICD-10-CM | POA: Diagnosis not present

## 2021-02-03 DIAGNOSIS — K760 Fatty (change of) liver, not elsewhere classified: Secondary | ICD-10-CM

## 2021-02-03 DIAGNOSIS — Z0000 Encounter for general adult medical examination without abnormal findings: Secondary | ICD-10-CM | POA: Diagnosis not present

## 2021-02-03 DIAGNOSIS — I10 Essential (primary) hypertension: Secondary | ICD-10-CM

## 2021-02-03 DIAGNOSIS — M6740 Ganglion, unspecified site: Secondary | ICD-10-CM

## 2021-02-03 MED ORDER — METOPROLOL SUCCINATE ER 50 MG PO TB24: 50.0000 mg | ORAL_TABLET | Freq: Every day | ORAL | 1 refills | Status: DC

## 2021-02-03 NOTE — Progress Notes (Signed)
SUBJECTIVE:   Chief compliant/HPI: annual examination  Bailey Hooper is a 55 y.o. who presents today for an annual exam.   The patient reports a 10-day history of congestion, green mucus, productive cough.  She denies fevers.  She denies chest pain or difficulty breathing.  She reports her symptoms actually improved since she took 3 doses of amoxicillin clavulanate 875 mg.  No lower extremity edema or chest pain.  The patient recently saw gastroenterology and is scheduled for gastric emptying test followed by her colonoscopy.  She is up-to-date on her mammogram.  She is up-to-date on her cervical cancer screening.  She is interested in routine sexually-transmitted infection testing today.  The patient recently saw her psychiatrist.  He is recommended she start Seroquel and clonazepam.  She has not yet picked up either.  She is very hesitant to start the Seroquel as she had significant sleepiness and felt very fatigued when taking this.  She is also uncertain of the side effects and is curious about the metabolic side effects of this medication today.  The patient reports she saw the hand specialist for her hand symptoms and numbness.  But now she has a "bump" on the top of her right wrist.  She is right-hand dominant.  The bump has increased in size this over time it is soft and intermittently tender. History tabs reviewed and updated .   Review of systems form reviewed and notable for negative except for above.   OBJECTIVE:   BP (!) 146/103   Pulse 79   Wt 185 lb (83.9 kg)   SpO2 99%   BMI 29.86 kg/m     Cardiac: Regular rate and rhythm. Normal S1/S2. No murmurs, rubs, or gallops appreciated. Lungs: Good air entry bilaterally but increased breath sounds on left as compared to right and egophony on the left as compared to right. Bilateral hands examined.  Over the right hand there is now a small soft what appears to be dorsal ganglion cyst that is minimally tender not erythematous.   She has some pain with dorsiflexion of the wrist due to this today.  Has full range of motion of the joint. Psych: Pleasant and appropriate    ASSESSMENT/PLAN:  Suspect a ganglion cyst, this is a new problem.  We discussed treatment options.  She is been wearing a splint which she thinks may be irritated this.  Will return to hand surgery for evaluation of this.  Cough, given duration of symptoms and examination I am slightly concerned for community-acquired pneumonia.  X-ray obtained today as well as CBC.  More likely this represents remnants of a viral respiratory infection.  Discussed continuing her antihistamine for her seasonal allergies.  Medication Monitoring as the patient may be starting atypical antipsychotic obtain routine baseline monitoring including hepatic function, direct LDL and A1c today.  Discussed potential side effects of Seroquel.  Discussed that these do not happen all patients but can be seen in some at times.  Annual Examination  See AVS for age appropriate recommendations  PHQ score elevated, reviewed and discussed.  Currently sees psychiatry. BP reviewed and at goal not at goal and metoprolol was increased to 50 mg twice daily she is to monitor her blood pressure twice a week for the next few weeks and send me the values.  We discussed the potential side effects her heart rate is appropriate for this increase. Asked about intimate partner violence and resources given as appropriate    Considered the following items  based upon USPSTF recommendations: Diabetes screening: discussed and ordered Screening for elevated cholesterol: ordered HIV testing: ordered Hepatitis C: ordered Hepatitis B: ordered Syphilis if at high risk: ordered GC/CT not at high risk and not ordered. Osteoporosis screening considered based upon risk of fracture from [facility] calculator. Mnot Reviewed risk factors for latent tuberculosis and not indicated  Cervical cancer screening: prior Pap  reviewed, repeat due in 2027 a this is Dr. Owens Shark returning page Breast cancer screening:  UTD  Colorectal cancer screening:  seeing GI--will be scheduled after emptying study  Vaccinations declined--discused   Follow up in 2 months for BP--she is to send values.  Discuss low dose CT and spirometry at follow up   Martyn Malay, [place]

## 2021-02-03 NOTE — Patient Instructions (Signed)
It was wonderful to see you today.  Please bring ALL of your medications with you to every visit.   Today we talked about:    An x-ray was ordered for you---you do not need an appointment to have this completed.  I recommend going to [facility]   If the results are normal,I will send you a letter  I will call you with results if anything is abnormal   You will be called by the Hand Doctor for your wrist   Please take TWO of your metoprolol---we can follow up in 4-8 weeks  Please monitor your blood pressure at home twice a week  I will let you know the results of your labs    Thank you for choosing [facility].   Please call [phone] with any questions about today's appointment.  Please be sure to schedule follow up at the front  desk before you leave today.   Dorris Singh, MD  Family Medicine

## 2021-02-04 LAB — HEPATIC FUNCTION PANEL
ALT: 23 IU/L (ref 0–32)
AST: 16 IU/L (ref 0–40)
Albumin: 4.3 g/dL (ref 3.8–4.9)
Alkaline Phosphatase: 131 IU/L — ABNORMAL HIGH (ref 44–121)
Bilirubin Total: <0.2 mg/dL (ref 0.0–1.2)
Bilirubin, Direct: <0.1 mg/dL (ref 0.00–0.40)
Total Protein: 7.4 g/dL (ref 6.0–8.5)

## 2021-02-04 LAB — CBC
Hematocrit: 40 % (ref 34.0–46.6)
Hemoglobin: 13 g/dL (ref 11.1–15.9)
MCH: 26.7 pg (ref 26.6–33.0)
MCHC: 32.5 g/dL (ref 31.5–35.7)
MCV: 82 fL (ref 79–97)
Platelets: 344 10*3/uL (ref 150–450)
RBC: 4.87 x10E6/uL (ref 3.77–5.28)
RDW: 13.4 % (ref 11.7–15.4)
WBC: 7.3 10*3/uL (ref 3.4–10.8)

## 2021-02-04 LAB — HCV AB W REFLEX TO QUANT PCR: HCV Ab: <0.1 s/co ratio (ref 0.0–0.9)

## 2021-02-04 LAB — HEPATITIS B SURFACE ANTIGEN: Hepatitis B Surface Ag: NEGATIVE

## 2021-02-04 LAB — LDL CHOLESTEROL, DIRECT: LDL Direct: 128 mg/dL — ABNORMAL HIGH (ref 0–99)

## 2021-02-04 LAB — HCV INTERPRETATION

## 2021-02-04 LAB — RPR: RPR Ser Ql: NONREACTIVE

## 2021-02-04 LAB — HIV ANTIBODY (ROUTINE TESTING W REFLEX): HIV Screen 4th Generation wRfx: NONREACTIVE

## 2021-02-04 LAB — HEMOGLOBIN A1C
Est. average glucose Bld gHb Est-mCnc: 131 mg/dL
Hgb A1c MFr Bld: 6.2 % — ABNORMAL HIGH (ref 4.8–5.6)

## 2021-02-07 ENCOUNTER — Telehealth: Payer: Self-pay | Admitting: Family Medicine

## 2021-02-07 ENCOUNTER — Ambulatory Visit (INDEPENDENT_AMBULATORY_CARE_PROVIDER_SITE_OTHER): Payer: 59 | Admitting: Orthopedic Surgery

## 2021-02-07 VITALS — BP 164/94 | HR 76 | Ht 66.0 in | Wt 185.0 lb

## 2021-02-07 DIAGNOSIS — M659 Synovitis and tenosynovitis, unspecified: Secondary | ICD-10-CM | POA: Diagnosis not present

## 2021-02-07 DIAGNOSIS — M65332 Trigger finger, left middle finger: Secondary | ICD-10-CM | POA: Insufficient documentation

## 2021-02-07 MED ORDER — DICLOFENAC SODIUM 1 % EX GEL: 2.0000 g | Freq: Four times a day (QID) | CUTANEOUS | Status: DC

## 2021-02-07 MED ORDER — DICLOFENAC SODIUM 75 MG PO TBEC: 75.0000 mg | DELAYED_RELEASE_TABLET | Freq: Two times a day (BID) | ORAL | 0 refills | Status: AC

## 2021-02-07 NOTE — Telephone Encounter (Signed)
Called patient with results. Left generic voicemail  If she calls back please let her know - Her early marker for diabetes is still 6.2--same as last year--recommend repeating in 1 year. She does NOT have diabetes - Blood count and testing for infection all negative (normal) - Cholesterol is unchanged---I still recommend a medication called a statin--She is due for Blood pressure check in 4-6 weeks. We will discuss again at that visit - Liver tests look improved from prior.   Let me know what questions she may have.  Dorris Singh, MD  Family Medicine Teaching Service

## 2021-02-07 NOTE — Progress Notes (Signed)
Office Visit Note   Patient: Bailey Hooper           Date of Birth: [birth year]           MRN: [id number] Visit Date: 02/07/2021              Requested by: Martyn Malay, MD [address] [place],  [place] [address] PCP: Martyn Malay, MD   Assessment & Plan: Visit Diagnoses:  1. Synovitis of right hand   2. 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  We discussed treatment options for this condition including activity modification, oral or topical anti-inflammatories.  And surgical debridement.  At this point, she wants to try a different anti-inflammatory.  She also wants to try the topical Voltaren gel.  I will give her a prescription for both but cautioned her that she should not take both at the same time.  Regarding her left middle and ring finger triggering, she wants to continue to monitor this.  We discussed treatment options including corticosteroid injection and A1 pulley release.  At this point she is not ready to have any treatment and wants to see if this resolves on its own.  I can see her back in another month if she is still having problems.  Follow-Up Instructions: No follow-ups on file.   Orders:  No orders of the defined types were placed in this encounter.  Meds ordered this encounter  Medications   diclofenac (VOLTAREN) 75 MG EC tablet    Sig: Take 1 tablet (75 mg total) by mouth 2 (two) times daily for 14 days.    Dispense:  28 tablet    Refill:  0   diclofenac Sodium (VOLTAREN) 1 % topical gel 2 g      Procedures: No procedures performed   Clinical Data: No additional findings.   Subjective: Chief Complaint  Patient presents with   Right Wrist - Ganglion Cyst    This is a 55 year old right-hand-dominant female who presents today for follow-up.  She was seen about a month and a half ago with several  complaints.  She was having numbness and tingling involving both the right and left hand.  She did have some nocturnal symptoms in which she had to wake up and shake her hands.  She is wearing brace on both wrists especially at night and this has really helped her numbness and tingling.  She also noted swelling and stiffness of her MP and PIP joints of both hands.  She has been taking some naproxen intermittently which she thinks has helped.  Today her biggest complaint is swelling of the dorsal aspect of the right hand over the extensor tendons with pain on resisted extension of the index middle and ring fingers.  This occurs when she is doing certain activities like lifting plates or doing the dishes.  She also complains of triggering of her left middle and ring fingers.  She reports triggering of bilateral thumbs that resolved spontaneously.  The previous stiffness of her finger MP and PIP joints has significantly improved and is nearly resolved.     Review of Systems   Objective: Vital Signs: BP (!) 164/94 (BP Location: Left Arm, Patient Position: Sitting)   Pulse 76   Ht 5\' 6"  (1.676 m)   Wt 185 lb (83.9 kg)   BMI 29.86 kg/m  Physical Exam Constitutional:      Appearance: Normal appearance.  Cardiovascular:     Rate and Rhythm: Normal rate.     Pulses: Normal pulses.  Pulmonary:     Effort: Pulmonary effort is normal.  Skin:    General: Skin is warm and dry.     Capillary Refill: Capillary refill takes less than 2 seconds.  Neurological:     Mental Status: She is alert.    Right Hand Exam   Tenderness  Right hand tenderness location: TTP on dorsum of hand in area of swelling associated with extensor tendons. Swelling seems distal to wrist joint.  Range of Motion  The patient has normal right wrist ROM.   Muscle Strength  The patient has normal right wrist strength.  Other  Sensation: normal Pulse: present  Comments:  Discrete swelling on dorsum of hand distal to  wrist joint that moves with ROM of fingers suggesting extensor synovitis.  Her overall hand and finger swelling is much improved.  She has full ROM of her fingers.    Left Hand Exam   Tenderness  Left hand tenderness location: Mildly TTP at middle and ring finger MP joints.   Range of Motion  The patient has normal left wrist ROM.  Muscle Strength  The patient has normal left wrist strength.  Other  Sensation: normal Pulse: present  Comments:  Palpable triggering of middle and ring fingers.      Specialty Comments:  No specialty comments available.  Imaging: No results found.   PMFS History: Patient Active Problem List   Diagnosis Date Noted   Trigger finger, left middle finger 02/07/2021   Bilateral hand numbness 12/20/2020   Bartholin cyst 09/10/2020   NAFLD (nonalcoholic fatty liver disease) 08/05/2020   H/O colonoscopy 06/16/2020   Hot flashes 04/27/2019   Bipolar disorder ([place]) 01/18/2019   Asthma 01/18/2019   Hyperlipidemia 07/14/2018   Tobacco abuse 11/29/2017   Benign paroxysmal positional vertigo 11/29/2017   Major depressive disorder, recurrent episode, moderate ([place]) 06/28/2009   GENITAL HERPES 01/17/2009   Migraine headache 01/17/2009   Essential hypertension, benign 01/17/2009   GERD (gastroesophageal reflux disease) 01/17/2009   Irritable bowel syndrome 01/17/2009   Heart murmur 01/17/2009   Past Medical History:  Diagnosis Date   Allergy    Anxiety    Arthritis    Asthma    Depression    Fibroids    GERD (gastroesophageal reflux disease)    Hiatal hernia    High cholesterol    Hypertension    IBS (irritable bowel syndrome)    Migraines    Prediabetes    Sleep apnea    cpap   Tobacco use    Vertigo     Family History  Problem Relation Age of Onset   Colon cancer Other        Father   Cancer Other        Oral, uncle   Alcohol abuse Other        Father   Drug abuse Other        Father   Osteoporosis Other    Depression  Other    Hypertension Other    Diabetes Other    Heart disease Other        Grandmother   Colon cancer Father    Heart disease Father    Diabetes Father    Hypertension Father    Dementia Father    Heart disease Maternal Grandmother    Diabetes  Maternal Grandmother    Diabetes Mother    Depression Mother    Hypertension Mother    Esophageal cancer Neg Hx    Stomach cancer Neg Hx    Rectal cancer Neg Hx     Past Surgical History:  Procedure Laterality Date   CESAREAN SECTION     COLONOSCOPY     FOOT SURGERY     Left foot   Social History   Occupational History   Not on file  Tobacco Use   Smoking status: Every Day    Packs/day: 0.25    Years: 20.00    Pack years: 5.00    Types: Cigarettes    Start date: 04/09/1986   Smokeless tobacco: Never   Tobacco comments:    Previous 0.5 PPD. trying to quitt. Does not smoke when sick  Vaping Use   Vaping Use: Never used  Substance and Sexual Activity   Alcohol use: Yes    Alcohol/week: 0.0 standard drinks    Comment: rarely   Drug use: No   Sexual activity: Yes    Partners: Male

## 2021-02-10 ENCOUNTER — Encounter: Payer: Self-pay | Admitting: Gastroenterology

## 2021-02-15 ENCOUNTER — Ambulatory Visit (HOSPITAL_COMMUNITY): Payer: 59

## 2021-02-21 ENCOUNTER — Encounter: Payer: Self-pay | Admitting: Family Medicine

## 2021-02-22 ENCOUNTER — Encounter (HOSPITAL_COMMUNITY): Payer: Self-pay

## 2021-02-22 ENCOUNTER — Ambulatory Visit (HOSPITAL_COMMUNITY)
Admission: RE | Admit: 2021-02-22 | Discharge: 2021-02-22 | Disposition: A | Discharge status: Home / Self Care | Payer: 59 | Source: Ambulatory Visit | Attending: Nurse Practitioner | Admitting: Nurse Practitioner

## 2021-02-22 ENCOUNTER — Other Ambulatory Visit: Payer: Self-pay

## 2021-02-22 DIAGNOSIS — R748 Abnormal levels of other serum enzymes: Secondary | ICD-10-CM | POA: Diagnosis present

## 2021-02-22 DIAGNOSIS — R6881 Early satiety: Secondary | ICD-10-CM | POA: Insufficient documentation

## 2021-02-22 DIAGNOSIS — R112 Nausea with vomiting, unspecified: Secondary | ICD-10-CM | POA: Diagnosis present

## 2021-02-22 DIAGNOSIS — K760 Fatty (change of) liver, not elsewhere classified: Secondary | ICD-10-CM | POA: Diagnosis present

## 2021-02-22 MED ORDER — TECHNETIUM TC 99M SULFUR COLLOID
2.0000 | Freq: Once | INTRAVENOUS | Status: AC | PRN
  Administered 2021-02-22: 2.2 via ORAL

## 2021-02-27 ENCOUNTER — Encounter: Payer: Self-pay | Admitting: Family Medicine

## 2021-02-27 DIAGNOSIS — H8111 Benign paroxysmal vertigo, right ear: Secondary | ICD-10-CM

## 2021-02-28 ENCOUNTER — Other Ambulatory Visit: Payer: Self-pay | Admitting: *Deleted

## 2021-02-28 ENCOUNTER — Other Ambulatory Visit: Payer: Self-pay | Admitting: Nurse Practitioner

## 2021-02-28 DIAGNOSIS — J45909 Unspecified asthma, uncomplicated: Secondary | ICD-10-CM

## 2021-02-28 DIAGNOSIS — I10 Essential (primary) hypertension: Secondary | ICD-10-CM

## 2021-02-28 DIAGNOSIS — K589 Irritable bowel syndrome without diarrhea: Secondary | ICD-10-CM

## 2021-02-28 DIAGNOSIS — F419 Anxiety disorder, unspecified: Secondary | ICD-10-CM

## 2021-02-28 MED ORDER — ALBUTEROL SULFATE HFA 108 (90 BASE) MCG/ACT IN AERS: 2.0000 | INHALATION_SPRAY | Freq: Four times a day (QID) | RESPIRATORY_TRACT | 2 refills | Status: DC | PRN

## 2021-02-28 MED ORDER — TRAZODONE HCL 50 MG PO TABS: 25.0000 mg | ORAL_TABLET | Freq: Every evening | ORAL | 0 refills | Status: DC | PRN

## 2021-02-28 MED ORDER — DICYCLOMINE HCL 20 MG PO TABS: ORAL_TABLET | ORAL | 1 refills | Status: DC

## 2021-02-28 MED ORDER — AMLODIPINE BESYLATE 10 MG PO TABS: 10.0000 mg | ORAL_TABLET | Freq: Every day | ORAL | 3 refills | Status: DC

## 2021-03-07 ENCOUNTER — Other Ambulatory Visit: Payer: Self-pay | Admitting: Orthopedic Surgery

## 2021-03-07 ENCOUNTER — Telehealth: Payer: Self-pay | Admitting: Orthopedic Surgery

## 2021-03-07 DIAGNOSIS — M659 Synovitis and tenosynovitis, unspecified: Secondary | ICD-10-CM

## 2021-03-07 MED ORDER — DICLOFENAC SODIUM 1 % EX GEL: 4.0000 g | Freq: Four times a day (QID) | CUTANEOUS | Status: DC

## 2021-03-07 NOTE — Telephone Encounter (Signed)
Pt called and is wondering if [facility] was going to send her in a hand cream for the pain?   CB [phone]

## 2021-03-07 NOTE — Telephone Encounter (Signed)
I called and advised pt

## 2021-03-08 ENCOUNTER — Telehealth: Payer: Self-pay | Admitting: Orthopedic Surgery

## 2021-03-08 NOTE — Telephone Encounter (Signed)
Spoke with patient she advised the Rx for Diclofenac is not showing received at the pharmacy. I called the pharmacy and it was confirmed that the Rx did not come through.  The pharmacy asked if the Rx can be sent again. The number to contact patient is [phone]

## 2021-03-09 ENCOUNTER — Other Ambulatory Visit: Payer: Self-pay | Admitting: Radiology

## 2021-03-09 MED ORDER — DICLOFENAC SODIUM 1 % EX GEL: 4.0000 g | Freq: Four times a day (QID) | CUTANEOUS | 0 refills | Status: DC

## 2021-03-09 NOTE — Telephone Encounter (Signed)
Looks like was being rx'd as an office administer instead it being sent to the pharmacy. I have corrected this and it has now been sent to the pharmacy and receipt they got it @ 8:45am today

## 2021-03-27 ENCOUNTER — Ambulatory Visit (AMBULATORY_SURGERY_CENTER): Payer: 59

## 2021-03-27 ENCOUNTER — Other Ambulatory Visit: Payer: Self-pay | Admitting: Orthopedic Surgery

## 2021-03-27 ENCOUNTER — Other Ambulatory Visit: Payer: Self-pay

## 2021-03-27 VITALS — Ht 66.0 in | Wt 184.0 lb

## 2021-03-27 DIAGNOSIS — K760 Fatty (change of) liver, not elsewhere classified: Secondary | ICD-10-CM

## 2021-03-27 DIAGNOSIS — Z800 Family history of malignant neoplasm of digestive organs: Secondary | ICD-10-CM

## 2021-03-27 DIAGNOSIS — R748 Abnormal levels of other serum enzymes: Secondary | ICD-10-CM

## 2021-03-27 DIAGNOSIS — R6881 Early satiety: Secondary | ICD-10-CM

## 2021-03-27 DIAGNOSIS — R112 Nausea with vomiting, unspecified: Secondary | ICD-10-CM

## 2021-03-27 MED ORDER — CLENPIQ 10-3.5-12 MG-GM -GM/160ML PO SOLN: 1.0000 | ORAL | 0 refills | Status: DC

## 2021-03-27 NOTE — Progress Notes (Signed)
Pre visit completed via phone call; Patient verified name, DOB, and address; No egg or soy allergy known to patient  No issues known to pt with past sedation with any surgeries or procedures Patient denies ever being told they had issues or difficulty with intubation  No FH of Malignant Hyperthermia Pt is not on diet pills Pt is not on home 02  Pt is not on blood thinners  Pt reports issues with constipation - patient reports she takes Senna-kot laxative as needed; advised patient to also increased po fluids, activity, and fresh fruits/veggies No A fib or A flutter NO PA's for preps discussed with pt in PV today  Discussed with patient that the 2 day prep would require an OTC medications and she would need to also pick those up from the store- patient verbalized understanding  Due to the COVID-19 pandemic we are asking patients to follow certain guidelines in PV and the [facility]   Pt aware of COVID protocols and [place] guidelines    Patient given a sample of Clenpiq- LOT # [id number] Expires:04/2021

## 2021-04-07 ENCOUNTER — Encounter: Payer: 59 | Admitting: Gastroenterology

## 2021-04-28 ENCOUNTER — Encounter: Payer: Self-pay | Admitting: Family Medicine

## 2021-04-28 ENCOUNTER — Other Ambulatory Visit: Payer: Self-pay

## 2021-04-28 ENCOUNTER — Ambulatory Visit (INDEPENDENT_AMBULATORY_CARE_PROVIDER_SITE_OTHER): Payer: 59 | Admitting: Family Medicine

## 2021-04-28 VITALS — BP 154/97 | HR 91 | Wt 182.0 lb

## 2021-04-28 DIAGNOSIS — I10 Essential (primary) hypertension: Secondary | ICD-10-CM

## 2021-04-28 DIAGNOSIS — Z9889 Other specified postprocedural states: Secondary | ICD-10-CM

## 2021-04-28 DIAGNOSIS — K760 Fatty (change of) liver, not elsewhere classified: Secondary | ICD-10-CM | POA: Diagnosis not present

## 2021-04-28 DIAGNOSIS — R202 Paresthesia of skin: Secondary | ICD-10-CM | POA: Diagnosis not present

## 2021-04-28 DIAGNOSIS — Z87891 Personal history of nicotine dependence: Secondary | ICD-10-CM | POA: Diagnosis not present

## 2021-04-28 DIAGNOSIS — Z720 Tobacco use: Secondary | ICD-10-CM

## 2021-04-28 NOTE — Assessment & Plan Note (Signed)
Patient to call to reschedule.

## 2021-04-28 NOTE — Patient Instructions (Addendum)
It was wonderful to see you today.  Please bring ALL of your medications with you to every visit.   Today we talked about:  --- You will be called about a CT of yoru lungs  - Please call [facility]  CONGRATULATIONS on your quitting smoking (I know this was in September but still so awesome)  For your leg sensation, we will check specific electrolytes and for diabetes   Follow up in 1 month   I put information about an injection for weight loss--we use this for diabetes too    Thank you for choosing [facility].   Please call [phone] with any questions about today's appointment.  Please be sure to schedule follow up at the front  desk before you leave today.   Dorris Singh, MD  Family Medicine

## 2021-04-28 NOTE — Assessment & Plan Note (Signed)
Discussed.  There is stated that GLP-1 agonist like Wegovy can reduce potential of progression to fibrosis.  Could also potentially reduce weight.  Gave patient information about this.  Repeat hepatic function today.

## 2021-04-28 NOTE — Assessment & Plan Note (Signed)
Now a history of this.  She is quit smoking as of September.  Congratulated her on this again.  Ordered low-dose CT.

## 2021-04-28 NOTE — Progress Notes (Signed)
SUBJECTIVE:   CHIEF COMPLAINT: numbness, change medications  HPI:   Bailey Hooper is a 56 y.o.  with history notable for hypertension, mood disorder and overweight status presenting for routine follow-up.  The patient has had a significant life stressor since her last visit.  Her mother has been in intensive care on 2H since the new year.  Her mom had 3 separate cardiac arrests and has possible liver cancer and multiple pulmonary emboli currently.  Patient has several concerns.  She wonders if she would be tested for pulmonary embolism or genetic predisposition to blood clots.  She is not taking most of her medications.  She is spending most nights at the hospital and then cannot take her medications for anxiety as she has side effects from them.  She is currently living in her mom's which is where the event happened.  This is stressful for her.  She is driving her mom's car.  Her sister spends most days of the hospital and the patient has a slightly strained relationship with her sister.  The patient is still interested in an losing her central adipose tissue.  She would consider medication for this in the future.  The patient is no longer smoking.  She is not smokes in September.  She is interested in a low-dose CT.  She is smoked since age 83 or 43.  She smoked at least a half a pack a day.  She tried to quit multiple times and was successful again in September.  The patient reports intermittently high blood pressures at home.  She denies chest pain or headaches.  She does report some head fullness at times that is related to her vertigo.  She reports she has some dry mouth related to her amlodipine.  She is interested in altering her blood pressure medications.  The patient reports her vertigo is acting up, she has not yet seen Dr. Benjamine Mola.  She is scheduled for the spring.  She was scheduled when her mom was ill.  The patient did not have her colonoscopy due to several stressors over the  holidays.  She is planning to reschedule.  She does report her vomiting is better although her nausea does persist.  Her weight has been stable.  The patient reports intermittent right lower extremity paresthesias.  She also reports some on the left.  She is very concerned she has diabetes.  B12 was recently checked and was normal.  She has not no specific back pain.  She denies falls but does report some gait unsteadiness at times.  No polyuria polydipsia. PERTINENT  PMH / PSH/Family/Social History : Updated and reviewed as appropriate  OBJECTIVE:   BP (!) 154/97    Pulse 91    Wt 182 lb (82.6 kg)    BMI 29.38 kg/m   Today's weight:  Last Weight  Most recent update: 04/28/2021  9:22 AM    Weight  82.6 kg (182 lb)            Review of prior weights: [employer]   04/28/21 0921  Weight: 182 lb (82.6 kg)    Pleasant.  Woman in no apparent distress.  Cardiac exam regular rate and rhythm no murmurs rubs or gallops.  Lungs clear bilaterally.  Abdomen is soft nontender nondistended.  Lower extremity exam she has no edema.  She has normal sensation in her lower extremity dermatomes.  She has 2+ patellar reflexes and preserved strength with plantarflexion, dorsiflexion, knee extension and flexion and  hip flexion.  She has a normal gait.  ASSESSMENT/PLAN:   Essential hypertension, benign Consider increasing metoprolol versus stopping amlodipine and starting losartan.  We will need to check metabolic panel first.  We will check this today and then make adjustments from there.  NAFLD (nonalcoholic fatty liver disease) Discussed.  There is stated that GLP-1 agonist like Wegovy can reduce potential of progression to fibrosis.  Could also potentially reduce weight.  Gave patient information about this.  Repeat hepatic function today.  Tobacco abuse Now a history of this.  She is quit smoking as of September.  Congratulated her on this again.  Ordered low-dose CT.  H/O colonoscopy Patient to  call to reschedule.    Paresthesias suspect related to either degenerative disc disease or potentially diabetes.  A1c today.  Consider low back imaging in the future.  Continue to monitor at this time.  This could also be due to discontinuation of multiple medications at 1 time.  Mood disorder, encouraged her to follow-up with her therapist.  She is going to call to schedule.  Healthcare maintenance.  She is going to call to reschedule her colonoscopy.  She is entirely up-to-date on healthcare maintenance otherwise.    Dorris Singh, [place]

## 2021-04-28 NOTE — Assessment & Plan Note (Signed)
Consider increasing metoprolol versus stopping amlodipine and starting losartan.  We will need to check metabolic panel first.  We will check this today and then make adjustments from there.

## 2021-04-29 ENCOUNTER — Telehealth: Payer: Self-pay | Admitting: Family Medicine

## 2021-04-29 DIAGNOSIS — E663 Overweight: Secondary | ICD-10-CM

## 2021-04-29 DIAGNOSIS — I10 Essential (primary) hypertension: Secondary | ICD-10-CM

## 2021-04-29 LAB — BASIC METABOLIC PANEL
BUN/Creatinine Ratio: 8 — ABNORMAL LOW (ref 9–23)
BUN: 7 mg/dL (ref 6–24)
CO2: 25 mmol/L (ref 20–29)
Calcium: 9.7 mg/dL (ref 8.7–10.2)
Chloride: 101 mmol/L (ref 96–106)
Creatinine, Ser: 0.83 mg/dL (ref 0.57–1.00)
Glucose: 136 mg/dL — ABNORMAL HIGH (ref 70–99)
Potassium: 4.5 mmol/L (ref 3.5–5.2)
Sodium: 141 mmol/L (ref 134–144)
eGFR: 83 mL/min/{1.73_m2} (ref >59)

## 2021-04-29 LAB — HEPATIC FUNCTION PANEL
ALT: 18 IU/L (ref 0–32)
AST: 12 IU/L (ref 0–40)
Albumin: 4.5 g/dL (ref 3.8–4.9)
Alkaline Phosphatase: 133 IU/L — ABNORMAL HIGH (ref 44–121)
Bilirubin Total: 0.4 mg/dL (ref 0.0–1.2)
Bilirubin, Direct: 0.1 mg/dL (ref 0.00–0.40)
Total Protein: 7.6 g/dL (ref 6.0–8.5)

## 2021-04-29 LAB — HEMOGLOBIN A1C
Est. average glucose Bld gHb Est-mCnc: 137 mg/dL
Hgb A1c MFr Bld: 6.4 % — ABNORMAL HIGH (ref 4.8–5.6)

## 2021-04-29 MED ORDER — BD PEN NEEDLE SHORT U/F 31G X 8 MM MISC: 0 refills | Status: DC

## 2021-04-29 MED ORDER — LOSARTAN POTASSIUM 25 MG PO TABS: 25.0000 mg | ORAL_TABLET | Freq: Every day | ORAL | 3 refills | Status: DC

## 2021-04-29 MED ORDER — WEGOVY 0.25 MG/0.5ML ~~LOC~~ SOAJ: 0.2500 mg | SUBCUTANEOUS | 0 refills | Status: DC

## 2021-04-29 NOTE — Telephone Encounter (Signed)
Called with results. A1C rising, discussed exercise. Patient would like to consider Wegovy, Rx sent to pharmacy for cost.  BP not at goal at visit---started losartan  25 mg daily. Reminder to self to repeat BMP in 2 weeks.  Dorris Singh, MD  Family Medicine Teaching Service

## 2021-05-01 ENCOUNTER — Other Ambulatory Visit: Payer: Self-pay | Admitting: Nurse Practitioner

## 2021-05-01 ENCOUNTER — Other Ambulatory Visit: Payer: Self-pay | Admitting: Orthopedic Surgery

## 2021-05-09 ENCOUNTER — Encounter: Payer: Self-pay | Admitting: Family Medicine

## 2021-05-10 ENCOUNTER — Other Ambulatory Visit: Payer: Self-pay | Admitting: Family Medicine

## 2021-05-10 ENCOUNTER — Encounter: Payer: Self-pay | Admitting: Family Medicine

## 2021-05-10 DIAGNOSIS — I10 Essential (primary) hypertension: Secondary | ICD-10-CM

## 2021-05-11 ENCOUNTER — Telehealth: Payer: Self-pay

## 2021-05-11 ENCOUNTER — Other Ambulatory Visit (HOSPITAL_COMMUNITY): Payer: Self-pay

## 2021-05-11 NOTE — Telephone Encounter (Signed)
Patient's insurance company has authorized her imaging to be done at:  [facility] in [place] scheduling here at [facility] does not schedule for that location as [facility] prefers to schedule their own appointments.  Called patient to inquire as to whether she would be willing to travel as she lives in [place].  Patient states that she "would rather call them herself".  Address and number given to patient.  [address], [place] [address]  [phone]  .Ozella Almond, CMA

## 2021-05-12 ENCOUNTER — Other Ambulatory Visit: Payer: Self-pay

## 2021-05-12 ENCOUNTER — Ambulatory Visit (INDEPENDENT_AMBULATORY_CARE_PROVIDER_SITE_OTHER): Payer: 59

## 2021-05-12 DIAGNOSIS — Z87891 Personal history of nicotine dependence: Secondary | ICD-10-CM

## 2021-05-15 ENCOUNTER — Telehealth: Payer: Self-pay | Admitting: Family Medicine

## 2021-05-15 NOTE — Telephone Encounter (Signed)
Called patient. CT without nodule, repeat 1 year.  Dorris Singh, MD  Family Medicine Teaching Service

## 2021-05-26 ENCOUNTER — Encounter: Payer: Self-pay | Admitting: Family Medicine

## 2021-05-28 ENCOUNTER — Encounter: Payer: Self-pay | Admitting: Family Medicine

## 2021-05-29 NOTE — Telephone Encounter (Signed)
Called patient. Patient reports right lower quadrant pain, intermittently for approx 2 weeks. Reports that randomly she will have a sharp pain that resolves after a few seconds.   Denies associated fever, nausea, vomiting or diarrhea.   Patient has appointment with GI specialist on 3/13, however, wanted PCP to be aware and see if she felt that patient should be seen sooner in our office.   Patient states that she will call our office later this week to schedule lab appointment.   Please advise.   Talbot Grumbling, RN

## 2021-05-30 ENCOUNTER — Telehealth: Payer: Self-pay | Admitting: Family Medicine

## 2021-05-30 NOTE — Telephone Encounter (Signed)
Called patient. Had intermittent RUQ pain intermittently, sharp, not pleuritic. No chest pain, dyspnea. Advised checking with GI re: milk thistle. All questions answered.   Dorris Singh, MD  Family Medicine Teaching Service

## 2021-06-16 ENCOUNTER — Other Ambulatory Visit: Payer: Self-pay

## 2021-06-16 ENCOUNTER — Ambulatory Visit (INDEPENDENT_AMBULATORY_CARE_PROVIDER_SITE_OTHER): Payer: Self-pay | Admitting: Clinical

## 2021-06-16 DIAGNOSIS — F3132 Bipolar disorder, current episode depressed, moderate: Secondary | ICD-10-CM

## 2021-06-16 DIAGNOSIS — F411 Generalized anxiety disorder: Secondary | ICD-10-CM

## 2021-06-16 NOTE — Progress Notes (Signed)
Virtual Visit via Telephone Note ?  ?I connected with Bailey Hooper on 06/16/21 at  10:00 AM EDT by telephone and verified that I am speaking with the correct person using two identifiers. ?  ?Location: ?Patient: Home ?Provider: Office ?  ?I discussed the limitations, risks, security and privacy concerns of performing an evaluation and management service by telephone and the availability of in person appointments. I also discussed with the patient that there may be a patient responsible charge related to this service. The patient expressed understanding and agreed to proceed. ?  ?THERAPIST PROGRESS NOTE ?  ?Session Time: 10:00 AM-10:30AM ?  ?Participation Level: Active ?  ?Behavioral Response: CasualAlertAnxious ?  ?Type of Therapy: Individual Therapy ?  ?Treatment Goals addressed: Coping ?  ?Interventions: CBT ?  ?Summary: Bailey Hooper is a 56 y.o. female who presents with Bipolar Disorder./ GAD. The OPT therapist worked with the patient has re-engaged in her mental health treatment including getting back in with Outpatient and her psychiatrist. The patient spoke about the impact of her Mothers health condition which is serious with her Mother passing out in the patients arm and having to be revived several times and was initially on a ventilator the patients Mother after a long road in recovery is going to be Discharging to stay with the patients sister in [place]. The patient has been having difficulty with chest pain this in part connected to the amount of stress from her Mothers situation, also in part from conflict between the patient and her sister around care for the patients Mother. The OPT therapist utilized Motivational Interviewing to assist in creating therapeutic repore. The patient in the session was engaged and work in collaboration giving feedback about her triggers and symptoms over the past few weeks. The patient spoke about her mood episodes, extra pressure from her Mothers health situation. The  patient spoke about learning that her Mother has signed over everything should she pass away to her sister. The patients sister had her Mother at this point sign a DNR. The OPT therapist utilized Cognitive Behavioral Therapy through cognitive restructuring as well as worked with the patient on coping strategies to assist in management of mood and as she continues to work on her interactions with family. The patient spoke about feeling left out and not included in conversation and disrespected by not being involved in serious conversations. The patient at one point blocked her sister from her phone. The patient spoke about recently unblocking her sister and the sister is currently asking the patient to be involved more now because the patients Mother is being Discharged from the hospital.   ?  ?Suicidal/Homicidal: Nowithout intent/plan ?  ?Therapist Response: The OPT therapist worked with the patient for the patients scheduled session. The patient was engaged in her session and gave feedback in relation to triggers, symptoms, and behavior responses over the past few weeks. The OPT therapist worked with the patient utilizing an in session Cognitive Behavioral Therapy exercise. The patient was responsive in the session and verbalized, " I had to tell my sister who was upset with me for stepping back from the situation that I love my Mother but when my body is telling me something I have to listen to my body and take care of myself". The OPT therapist worked with the patient on managing her own individual health and being mindful around how much she can involve herself without being triggered with upcoming changes with her Mother leaving the hospital. The OPT therapist  worked with the patient on keeping her focus on her health care basic needs including her own eating, sleeping, exercise, and hygenie.The OPT therapist will continue treatment work with the patient in her next scheduled session ?  ?Plan: Return again in 3  weeks. ?  ?Diagnosis:      Axis I: Bipolar Disorder/ GAD   ?  ?                        Axis II: No diagnosis ? ?Collaboration of Care: No additional collaboration of care for this session.  ?  ?Patient/Guardian was advised Release of Information must be obtained prior to any record release in order to collaborate their care with an outside provider. Patient/Guardian was advised if they have not already done so to contact the registration department to sign all necessary forms in order for [place] to release information regarding their care.  ?  ?Consent: Patient/Guardian gives verbal consent for treatment and assignment of benefits for services provided during this visit. Patient/Guardian expressed understanding and agreed to proceed ? ? ?  ?I discussed the assessment and treatment plan with the patient. The patient was provided an opportunity to ask questions and all were answered. The patient agreed with the plan and demonstrated an understanding of the instructions. ?  ?The patient was advised to call back or seek an in-person evaluation if the symptoms worsen or if the condition fails to improve as anticipated. ?  ?I provided 45 minutes of non-face-to-face time during this encounter. ?  ?Lennox Grumbles, LCSW ?  ?06/16/2021 ?

## 2021-06-19 ENCOUNTER — Ambulatory Visit: Payer: 59 | Admitting: Physician Assistant

## 2021-06-19 ENCOUNTER — Encounter: Payer: Self-pay | Admitting: Physician Assistant

## 2021-06-19 VITALS — BP 164/80 | HR 72 | Ht 66.0 in | Wt 186.0 lb

## 2021-06-19 DIAGNOSIS — R748 Abnormal levels of other serum enzymes: Secondary | ICD-10-CM

## 2021-06-19 DIAGNOSIS — Z800 Family history of malignant neoplasm of digestive organs: Secondary | ICD-10-CM

## 2021-06-19 DIAGNOSIS — K760 Fatty (change of) liver, not elsewhere classified: Secondary | ICD-10-CM | POA: Diagnosis not present

## 2021-06-19 MED ORDER — NA SULFATE-K SULFATE-MG SULF 17.5-3.13-1.6 GM/177ML PO SOLN: 1.0000 | Freq: Once | ORAL | 0 refills | Status: AC

## 2021-06-19 NOTE — Progress Notes (Signed)
Chief Complaint: Screening for colon cancer and fatty liver  HPI:    Bailey Hooper is a 56 year old African-American female with a past medical history as listed below including IBS and reflux, known to Dr. Silverio Decamp, who presents to clinic today for discussion of a repeat colonoscopy due to a family history of colon cancer and fatty liver.    06/15/2020 EGD with normal esophagus, dilated and otherwise normal to the duodenum.  Colonoscopy on the same date with poor bowel prep, three 3-4 mm polyps in the sigmoid colon, multiple 1-3 mm polyps in the rectum and rectosigmoid colon and stool in the entire colon as well as diverticulosis in the ascending colon nonbleeding internal hemorrhoids.  Biopsy showed hyperplastic polyps a repeat was recommended in 1 year with a 2-day bowel prep.    01/12/2021 patient seen in clinic by Carl Best, NP and at that time discussed nausea, vomiting, stomach churning and bad breath.  Reviewed a right upper quadrant ultrasound 08/03/2020 that showed increased echogenicity of the liver that was common with fatty infiltration.  That time also discussed isolated alk phos elevation which was thought likely due to known hepatic steatosis.  Patient was advised to eat a low-carb diet and exercise.    Today, the patient tells me that she was not really sure what to do for her known fatty liver.  Apparently was brought up again by her PCP recently and she tells me she has been researching various supplements and trying to do a liver detox shake in the mornings.  The shake is helped her to have more regular bowel movements and now she hardly requires her Senokot but maybe once a month.  She is worried about her liver and asked if there are certain things she should not be eating or drinking.  Does tell me she has occasional twinges of pain in her right upper quadrant but these do seem better not she is having more normal bowel movements.    Also brings up the need for a colonoscopy.   Tells me that she would like to try different bowel prep (previously Clenpiq).    Denies fever, chills, blood in her stool, heartburn or reflux.  Past Medical History:  Diagnosis Date   Allergy    Anxiety    Arthritis    Asthma    Depression    Fibroids    GERD (gastroesophageal reflux disease)    Hiatal hernia    High cholesterol    Hypertension    IBS (irritable bowel syndrome)    Migraines    Prediabetes    Sleep apnea    cpap   Tobacco use    Vertigo     Past Surgical History:  Procedure Laterality Date   CESAREAN SECTION     COLONOSCOPY     FOOT SURGERY     Left foot    Current Outpatient Medications  Medication Sig Dispense Refill   albuterol (VENTOLIN HFA) 108 (90 Base) MCG/ACT inhaler Inhale 2 puffs into the lungs every 6 (six) hours as needed for wheezing. 18 g 2   AMBULATORY NON FORMULARY MEDICATION GI Cock tail: 90 ml viscous lidocaine, 90 ml 10/mg/ 5 ml dicyclomine, 270 ml maalox 450 mL 0   amLODipine (NORVASC) 10 MG tablet Take 1 tablet (10 mg total) by mouth daily. 90 tablet 3   azelastine (OPTIVAR) 0.05 % ophthalmic solution Place 1 drop into both eyes 2 (two) times daily. 6 mL 12   clonazePAM (KLONOPIN) 0.5  MG tablet Take 0.5 mg by mouth daily as needed.     diclofenac (VOLTAREN) 75 MG EC tablet TAKE (1) TABLET TWICE DAILY. 28 tablet 0   diclofenac Sodium (VOLTAREN) 1 % GEL APPLY 4 GRAMS TOPICALLY 4 TIMES A DAY. 200 g 0   dicyclomine (BENTYL) 20 MG tablet 1/2 to 1 tab 3 times daily before meals and at bedtime (Patient not taking: Reported on 04/28/2021) 90 tablet 1   fluocinonide (LIDEX) 0.05 % external solution Apply 1 application topically 2 (two) times daily. 60 mL 2   gabapentin (NEURONTIN) 300 MG capsule Take 1 capsule by mouth as needed. (Patient not taking: Reported on 04/28/2021)     Insulin Pen Needle (B-D ULTRAFINE III SHORT PEN) 31G X 8 MM MISC Use to inject Wegovy 100 each 0   lansoprazole (PREVACID) 30 MG capsule Take 1 capsule (30 mg total) by  mouth daily at 12 noon. 90 capsule 3   loratadine (CLARITIN) 10 MG tablet Take 1 tablet (10 mg total) by mouth daily. 30 tablet 11   losartan (COZAAR) 25 MG tablet Take 1 tablet (25 mg total) by mouth at bedtime. 90 tablet 3   metoprolol succinate (TOPROL-XL) 50 MG 24 hr tablet Take 1 tablet (50 mg total) by mouth at bedtime. Take with or immediately following a meal. 90 tablet 1   mirtazapine (REMERON) 45 MG tablet Take 45 mg by mouth at bedtime.     Misc. Devices (SITZ BATH) MISC 1 Device by Does not apply route 2 (two) times daily. 1 each 0   mometasone (NASONEX) 50 MCG/ACT nasal spray 2 sprays each nostril daily 17 g 12   Multiple Minerals-Vitamins (CAL MAG ZINC +D3) TABS Take 1 tablet by mouth daily.     Multiple Vitamin (MULTIVITAMIN) capsule Take 1 capsule by mouth daily.     ondansetron (ZOFRAN-ODT) 4 MG disintegrating tablet DISSOLVE 1 TABLET UNDER THE TONGUE EVERY 6 HOURS AS NEEDED FOR NAUSEA AND VOMITING. 30 tablet 0   QUEtiapine (SEROQUEL) 25 MG tablet Take 25 mg by mouth at bedtime.     Sod Picosulfate-Mag Ox-Cit Acd (CLENPIQ) 10-3.5-12 MG-GM -GM/160ML SOLN Take 1 kit by mouth as directed. LOT # [id number] Expires:04/2021 (Patient not taking: Reported on 04/28/2021) 320 mL 0   SUMAtriptan (IMITREX) 50 MG tablet May repeat in 2 hours if headache persists or recurs with max of 100 mg in 24 hours. 9 tablet 11   traZODone (DESYREL) 50 MG tablet Take 0.5-1 tablets (25-50 mg total) by mouth at bedtime as needed for sleep. (Patient not taking: Reported on 04/28/2021) 30 tablet 0   No current facility-administered medications for this visit.    Allergies as of 06/19/2021 - Review Complete 04/28/2021  Allergen Reaction Noted   Lisinopril Palpitations and Cough 10/05/2013   Lamictal [lamotrigine] Hives 10/15/2018   Penicillins Rash 01/17/2009   Erythromycin base  12/20/2020   Erythromycin Nausea And Vomiting 08/16/2012    Family History  Problem Relation Age of Onset   Diabetes Mother     Depression Mother    Hypertension Mother    Colon polyps Father    Colon cancer Father    Heart disease Father    Diabetes Father    Hypertension Father    Dementia Father    Heart disease Maternal Grandmother    Diabetes Maternal Grandmother    Colon cancer Other        Father   Cancer Other        Oral, uncle  Alcohol abuse Other        Father   Drug abuse Other        Father   Osteoporosis Other    Depression Other    Hypertension Other    Diabetes Other    Heart disease Other        Grandmother   Esophageal cancer Neg Hx    Stomach cancer Neg Hx    Rectal cancer Neg Hx     Social History   Socioeconomic History   Marital status: Divorced    Spouse name: Not on file   Number of children: 1   Years of education: 12   Highest education level: High school graduate  Occupational History   Not on file  Tobacco Use   Smoking status: Former    Packs/day: 0.25    Years: 20.00    Pack years: 5.00    Types: Cigarettes    Start date: 04/09/1986    Quit date: 12/08/2020    Years since quitting: 0.5   Smokeless tobacco: Never   Tobacco comments:    Previous 0.5 PPD. trying to quit. Does not smoke when sick  Vaping Use   Vaping Use: Never used  Substance and Sexual Activity   Alcohol use: Not Currently   Drug use: No   Sexual activity: Yes    Partners: Male  Other Topics Concern   Not on file  Social History Narrative   Lives with: Alone (grandaughters visit throughout the week, ages 21 and 56 y/o)   Works: Unemployed in spring 2020 due to COVID-19   Married: divorced      Social Determinants of Radio broadcast assistant Strain: Not on file  Food Insecurity: No Food Insecurity   Worried About Charity fundraiser in the Last Year: Never true   Arboriculturist in the Last Year: Never true  Transportation Needs: No Transportation Needs   Lack of Transportation (Medical): No   Lack of Transportation (Non-Medical): No  Physical Activity: Not on file   Stress: Stress Concern Present   Feeling of Stress : Very much  Social Connections: Not on file  Intimate Partner Violence: Not on file    Review of Systems:    Constitutional: No weight loss, fever or chills Cardiovascular: No chest pain Respiratory: No SOB Gastrointestinal: See HPI and otherwise negative   Physical Exam:  Vital signs: BP (!) 164/80    Pulse 72    Ht 5' 6"  (1.676 m)    Wt 186 lb (84.4 kg)    BMI 30.02 kg/m    Constitutional:   Pleasant AA female appears to be in NAD, Well developed, Well nourished, alert and cooperative Respiratory: Respirations even and unlabored. Lungs clear to auscultation bilaterally.   No wheezes, crackles, or rhonchi.  Cardiovascular: Normal S1, S2. No MRG. Regular rate and rhythm. No peripheral edema, cyanosis or pallor.  Gastrointestinal:  Soft, nondistended, nontender. No rebound or guarding. Normal bowel sounds. No appreciable masses or hepatomegaly. Rectal:  Not performed.  Psychiatric: Oriented to person, place and time. Demonstrates good judgement and reason without abnormal affect or behaviors.  RELEVANT LABS AND IMAGING: CBC    Component Value Date/Time   WBC 7.3 02/03/2021 1216   WBC 9.6 06/30/2012 2230   RBC 4.87 02/03/2021 1216   RBC 5.16 (H) 06/30/2012 2230   HGB 13.0 02/03/2021 1216   HCT 40.0 02/03/2021 1216   PLT 344 02/03/2021 1216   MCV 82 02/03/2021 1216  MCH 26.7 02/03/2021 1216   MCH 26.9 06/30/2012 2230   MCHC 32.5 02/03/2021 1216   MCHC 33.3 06/30/2012 2230   RDW 13.4 02/03/2021 1216   LYMPHSABS 3.9 06/30/2012 2230   MONOABS 0.7 06/30/2012 2230   EOSABS 0.3 06/30/2012 2230   BASOSABS 0.1 06/30/2012 2230    CMP     Component Value Date/Time   NA 141 04/28/2021 1210   K 4.5 04/28/2021 1210   CL 101 04/28/2021 1210   CO2 25 04/28/2021 1210   GLUCOSE 136 (H) 04/28/2021 1210   GLUCOSE 119 (H) 06/11/2016 1637   BUN 7 04/28/2021 1210   CREATININE 0.83 04/28/2021 1210   CREATININE 0.71 06/11/2016 1637    CALCIUM 9.7 04/28/2021 1210   PROT 7.6 04/28/2021 1210   ALBUMIN 4.5 04/28/2021 1210   AST 12 04/28/2021 1210   ALT 18 04/28/2021 1210   ALKPHOS 133 (H) 04/28/2021 1210   BILITOT 0.4 04/28/2021 1210   GFRNONAA 100 01/08/2020 1007   GFRNONAA >89 06/11/2016 1637   GFRAA 115 01/08/2020 1007   GFRAA >89 06/11/2016 1637    Assessment: 1.  Fatty liver: Known since at least February of last year, alk phos is chronically elevated and likely related to this 2.  Family history of colon cancer: Last colonoscopy a year ago with poor prep, repeat was recommended in a year 3.  Right upper quadrant discomfort: Occasional pain in this area, better with relief of constipation; consider relation to constipation versus musculoskeletal etiology  Plan: 1.  Scheduled patient for a repeat colonoscopy given poor bowel prep a year ago.  She will have a 2-day bowel prep this time.  She was given alternate prep.  She was scheduled Dr. Silverio Decamp in the [facility]. Patient is appropriate for endoscopic procedure(s) in the ambulatory ([place]) setting.  Did provide the patient a detailed list of risks for the procedure and she agrees to proceed. 2.  Discussed fatty liver in detail and answered all the patient's questions.  In essence she needs to abide by a low-carb/low sugar diet.  We discussed that this means healthy foods including things grown in the earth and without ingredients list.  She can still eat her protein as she wishes.  Does tell me she has a problem with carbs.  Recommended against any supplements for her liver including milk thistle.  Discussed that we are not exactly sure what these do and they could cause more harm.  Her liver detox shakes sound unharmful.  She can continue them if she wishes.  Especially since they are helping her with her bowel movements. 3.  Discussed right upper quadrant discomfort, this could have been related to constipation as it seems better now versus musculoskeletal etiology but does not  have anything to do with her fatty liver. 4.  Patient to follow in clinic per recommendations after colonoscopy.  Ellouise Newer, PA-C [facility] 06/19/2021, 2:39 PM  Cc: Martyn Malay, MD

## 2021-06-19 NOTE — Patient Instructions (Signed)
You have been scheduled for a colonoscopy. Please follow written instructions given to you at your visit today.  ?Please pick up your prep supplies at the pharmacy within the next 1-3 days. ?If you use inhalers (even only as needed), please bring them with you on the day of your procedure. ? ?If you are age 56 or older, your body mass index should be between 23-30. Your Body mass index is 30.02 kg/m?Marland Kitchen If this is out of the aforementioned range listed, please consider follow up with your Primary Care Provider. ? ?If you are age 5 or younger, your body mass index should be between 19-25. Your Body mass index is 30.02 kg/m?Marland Kitchen If this is out of the aformentioned range listed, please consider follow up with your Primary Care Provider.  ? ?________________________________________________________ ? ?The [facility] providers would like to encourage you to use [facility] to communicate with providers for non-urgent requests or questions.  Due to long hold times on the telephone, sending your provider a message by [facility] may be a faster and more efficient way to get a response.  Please allow 48 business hours for a response.  Please remember that this is for non-urgent requests.  ?_______________________________________________________ ? ?

## 2021-07-04 NOTE — Progress Notes (Signed)
Reviewed and agree with documentation and assessment and plan. K. Veena Reinhart Saulters , MD   

## 2021-07-07 ENCOUNTER — Ambulatory Visit (INDEPENDENT_AMBULATORY_CARE_PROVIDER_SITE_OTHER): Payer: Self-pay | Admitting: Clinical

## 2021-07-07 DIAGNOSIS — F3132 Bipolar disorder, current episode depressed, moderate: Secondary | ICD-10-CM

## 2021-07-07 DIAGNOSIS — F411 Generalized anxiety disorder: Secondary | ICD-10-CM

## 2021-07-07 NOTE — Progress Notes (Signed)
Virtual Visit via Telephone Note ?  ?I connected with Bailey Hooper on 07/07/21 at  10:00 AM EDT by telephone and verified that I am speaking with the correct person using two identifiers. ?  ?Location: ?Patient: Home ?Provider: Office ?  ?I discussed the limitations, risks, security and privacy concerns of performing an evaluation and management service by telephone and the availability of in person appointments. I also discussed with the patient that there may be a patient responsible charge related to this service. The patient expressed understanding and agreed to proceed. ?  ?THERAPIST PROGRESS NOTE ?  ?Session Time: 10:00 AM-10:30AM ?  ?Participation Level: Active ?  ?Behavioral Response: CasualAlertAnxious ?  ?Type of Therapy: Individual Therapy ?  ?Treatment Goals addressed: Coping ?  ?Interventions: CBT ?  ?Summary: Bailey Hooper is a 56 y.o. female who presents with Bipolar Disorder./ GAD. The OPT therapist worked with the patient has re-engaged in her mental health treatment including getting back in with Outpatient and her psychiatrist. The patient spoke about the impact of her Mothers health condition which is serious the patient was scheduled to be Discharging to stay with the patients sister in [place],, however, she had a low blood sugar episode and is now back in the hospital. The plan moving forward is still the same with the patients mother discharging to live with her sister at the sisters house in [place].The patient spoke about her plans at this point to potentially move to [place]. The OPT therapist utilized Motivational Interviewing to assist in creating therapeutic repore. The patient in the session was engaged and work in collaboration giving feedback about her triggers and symptoms over the past few weeks. The patient spoke about her work over the past few week focusing on her health due to her liver condition and working to lose weight to improve her liver condition. The OPT therapist  utilized Cognitive Behavioral Therapy through cognitive restructuring as well as worked with the patient on coping strategies to assist in management of mood and as she continues to work on her interactions with family. The patient spoke about with her medication difficulty with feelings of fatigue and this being a difficult adjustment as well as fatigue being in part connected to her liver condition.  ?  ?Suicidal/Homicidal: Nowithout intent/plan ?  ?Therapist Response: The OPT therapist worked with the patient for the patients scheduled session. The patient was engaged in her session and gave feedback in relation to triggers, symptoms, and behavior responses over the past few weeks. The OPT therapist worked with the patient utilizing an in session Cognitive Behavioral Therapy exercise. The patient was responsive in the session and verbalized, " I try to motivate myself and get up and be more active". The OPT therapist worked with the patient on managing her own individual health and being mindful of her own basic care needs including eating, sleeping, exercise, and hygenie.The OPT therapist will continue treatment work with the patient in her next scheduled session ?  ?Plan: Return again in 3 weeks. ?  ?Diagnosis:      Axis I: Bipolar Disorder/ GAD   ?  ?                        Axis II: No diagnosis ?  ?Collaboration of Care: No additional collaboration of care for this session.  ?  ?Patient/Guardian was advised Release of Information must be obtained prior to any record release in order to collaborate their care with an  outside provider. Patient/Guardian was advised if they have not already done so to contact the registration department to sign all necessary forms in order for [place] to release information regarding their care.  ?  ?Consent: Patient/Guardian gives verbal consent for treatment and assignment of benefits for services provided during this visit. Patient/Guardian expressed understanding and agreed to  proceed ?   ?  ?I discussed the assessment and treatment plan with the patient. The patient was provided an opportunity to ask questions and all were answered. The patient agreed with the plan and demonstrated an understanding of the instructions. ?  ?The patient was advised to call back or seek an in-person evaluation if the symptoms worsen or if the condition fails to improve as anticipated. ?  ?I provided 30 minutes of non-face-to-face time during this encounter. ?  ?Lennox Grumbles, LCSW ?  ?07/07/2021 ?

## 2021-07-19 ENCOUNTER — Encounter: Payer: Self-pay | Admitting: Family Medicine

## 2021-07-20 ENCOUNTER — Other Ambulatory Visit: Payer: 59

## 2021-07-20 DIAGNOSIS — I10 Essential (primary) hypertension: Secondary | ICD-10-CM

## 2021-07-21 LAB — BASIC METABOLIC PANEL
BUN/Creatinine Ratio: 14 (ref 9–23)
BUN: 11 mg/dL (ref 6–24)
CO2: 24 mmol/L (ref 20–29)
Calcium: 9.6 mg/dL (ref 8.7–10.2)
Chloride: 104 mmol/L (ref 96–106)
Creatinine, Ser: 0.77 mg/dL (ref 0.57–1.00)
Glucose: 104 mg/dL — ABNORMAL HIGH (ref 70–99)
Potassium: 3.9 mmol/L (ref 3.5–5.2)
Sodium: 143 mmol/L (ref 134–144)
eGFR: 91 mL/min/{1.73_m2} (ref >59)

## 2021-07-24 ENCOUNTER — Encounter: Payer: Self-pay | Admitting: Family Medicine

## 2021-08-06 ENCOUNTER — Encounter: Payer: Self-pay | Admitting: Family Medicine

## 2021-08-07 ENCOUNTER — Encounter: Payer: Self-pay | Admitting: Family Medicine

## 2021-08-08 ENCOUNTER — Encounter: Payer: 59 | Admitting: Gastroenterology

## 2021-08-08 ENCOUNTER — Encounter: Payer: Self-pay | Admitting: Family Medicine

## 2021-08-10 ENCOUNTER — Other Ambulatory Visit: Payer: Self-pay | Admitting: *Deleted

## 2021-08-10 ENCOUNTER — Ambulatory Visit (INDEPENDENT_AMBULATORY_CARE_PROVIDER_SITE_OTHER): Payer: 59 | Admitting: Clinical

## 2021-08-10 DIAGNOSIS — I10 Essential (primary) hypertension: Secondary | ICD-10-CM

## 2021-08-10 DIAGNOSIS — F3132 Bipolar disorder, current episode depressed, moderate: Secondary | ICD-10-CM

## 2021-08-10 DIAGNOSIS — F411 Generalized anxiety disorder: Secondary | ICD-10-CM | POA: Diagnosis not present

## 2021-08-10 DIAGNOSIS — K219 Gastro-esophageal reflux disease without esophagitis: Secondary | ICD-10-CM

## 2021-08-10 MED ORDER — LANSOPRAZOLE 30 MG PO CPDR: 30.0000 mg | DELAYED_RELEASE_CAPSULE | Freq: Every day | ORAL | 3 refills | Status: AC

## 2021-08-10 MED ORDER — AMLODIPINE BESYLATE 10 MG PO TABS: 10.0000 mg | ORAL_TABLET | Freq: Every day | ORAL | 3 refills | Status: DC

## 2021-08-10 NOTE — Progress Notes (Signed)
Virtual Visit via Telephone Note ?  ?I connected with Bailey Hooper on 08/10/21 at  2:00 PM EDT by telephone and verified that I am speaking with the correct person using two identifiers. ?  ?Location: ?Patient: Home ?Provider: Office ?  ?I discussed the limitations, risks, security and privacy concerns of performing an evaluation and management service by telephone and the availability of in person appointments. I also discussed with the patient that there may be a patient responsible charge related to this service. The patient expressed understanding and agreed to proceed. ?  ?THERAPIST PROGRESS NOTE ?  ?Session Time: 2:00 PM-2:30 PM ?  ?Participation Level: Active ?  ?Behavioral Response: CasualAlertAnxious ?  ?Type of Therapy: Individual Therapy ?  ?Treatment Goals addressed: Coping ?  ?Interventions: CBT ?  ?Summary: Bailey Hooper is a 56 y.o. female who presents with Bipolar Disorder./ GAD. The OPT therapist worked with the patient has re-engaged in her mental health treatment including getting back in with Outpatient and her psychiatrist. The patient spoke about the impact of her Mothers health condition which is serious  and the patient has gone back into caregiving for her Mother which is impacting her ability to regulate her own health.The patient spoke about trying to get her sister to agree to bring on a in home health aid for their Mother, however, the patients sister who has power of attorney for their Mother is not at this time in agreement.. The OPT therapist utilized Motivational Interviewing to assist in creating therapeutic repore. The patient in the session was engaged and work in collaboration giving feedback about her triggers and symptoms over the past few weeks. The patient spoke about her work over the past few week focusing on her health. The OPT therapist utilized Cognitive Behavioral Therapy through cognitive restructuring as well as worked with the patient on coping strategies to assist in  management of mood and as she continues to work on her interactions with family. The patient spoke about not being willing to continue the caregiving alone without some cooperation and collaboration from her sister.  ?  ?Suicidal/Homicidal: Nowithout intent/plan ?  ?Therapist Response: The OPT therapist worked with the patient for the patients scheduled session. The patient was engaged in her session and gave feedback in relation to triggers, symptoms, and behavior responses over the past few weeks. The OPT therapist worked with the patient utilizing an in session Cognitive Behavioral Therapy exercise. The patient was responsive in the session and verbalized, " I dont know how long I can keep doing this myself and I want to be able to go home and work on my health and I feel like everytime I give my opinion it like I am treated like I dont know what I am talking about".  The patients sister was hospitalized and is being released today. The OPT therapist worked with the patient on managing her own individual health and being mindful of her own basic care needs including eating, sleeping, exercise, and hygenie.The OPT therapist will continue treatment work with the patient in her next scheduled session ?  ?Plan: Return again in 3 weeks. ?  ?Diagnosis:      Axis I: Bipolar Disorder/ GAD   ?  ?                        Axis II: No diagnosis ?  ?Collaboration of Care: No additional collaboration of care for this session.  ?  ?Patient/Guardian was advised Release  of Information must be obtained prior to any record release in order to collaborate their care with an outside provider. Patient/Guardian was advised if they have not already done so to contact the registration department to sign all necessary forms in order for [place] to release information regarding their care.  ?  ?Consent: Patient/Guardian gives verbal consent for treatment and assignment of benefits for services provided during this visit. Patient/Guardian  expressed understanding and agreed to proceed ?   ?  ?I discussed the assessment and treatment plan with the patient. The patient was provided an opportunity to ask questions and all were answered. The patient agreed with the plan and demonstrated an understanding of the instructions. ?  ?The patient was advised to call back or seek an in-person evaluation if the symptoms worsen or if the condition fails to improve as anticipated. ?  ?I provided 30 minutes of non-face-to-face time during this encounter. ?  ?Lennox Grumbles, LCSW ?  ?08/10/2021 ?

## 2021-08-14 ENCOUNTER — Encounter: Payer: Self-pay | Admitting: Family Medicine

## 2021-08-16 ENCOUNTER — Ambulatory Visit (INDEPENDENT_AMBULATORY_CARE_PROVIDER_SITE_OTHER): Payer: 59 | Admitting: Family Medicine

## 2021-08-16 ENCOUNTER — Ambulatory Visit: Payer: 59 | Admitting: Family Medicine

## 2021-08-16 ENCOUNTER — Other Ambulatory Visit: Payer: Self-pay | Admitting: Family Medicine

## 2021-08-16 DIAGNOSIS — Z720 Tobacco use: Secondary | ICD-10-CM

## 2021-08-16 DIAGNOSIS — L219 Seborrheic dermatitis, unspecified: Secondary | ICD-10-CM

## 2021-08-16 DIAGNOSIS — Z8249 Family history of ischemic heart disease and other diseases of the circulatory system: Secondary | ICD-10-CM | POA: Insufficient documentation

## 2021-08-16 DIAGNOSIS — Z818 Family history of other mental and behavioral disorders: Secondary | ICD-10-CM

## 2021-08-16 NOTE — Assessment & Plan Note (Signed)
I think her rash is consistent with this.  Do not think is perioral dermatiis or rosacea.  Treat with low potency steroid ?

## 2021-08-16 NOTE — Progress Notes (Signed)
? ? ?  SUBJECTIVE:  ? ?CHIEF COMPLAINT / HPI:  ? ?Concerns today:: ? ?Family History of PEs ?- mother age 56 had PEs early this year in conjunction with MIs.  Has a mass on her liver that is being evaluated ?- her sister while on BCPs had a PE ?- both above are on Eliquis ?- she has never had a blood clot ?- has stopped smoking  ?- she wonders if she should have a blood test  ? ?Face rash ?- comes and goes.  Red bumps sometimes flaky skin around nasolabial fold and in center of chest ?- history of seborrhea in scalp ?- using otc moisturizer only ? ?Concern for Dementia ?- mother recently diagnosed.  Wonders what she should do or should she have a test ? ?PERTINENT  PMH / PSH: does not exercise  ? ?OBJECTIVE:  ? ?BP (!) 127/92   Pulse 74   Wt 177 lb 3.2 oz (80.4 kg)   SpO2 100%   BMI 28.60 kg/m?   ?Heart - Regular rate and rhythm.  Gr 2/6 sys murmur (she has had for years).    ?Lungs:  Normal respiratory effort, chest expands symmetrically. Lungs are clear to auscultation, no crackles or wheezes. ?Extremities:  No cyanosis, edema, or deformity noted with good range of motion of all major joints.    ?Face - small areas of erythem with slight flaking in nasolabial fold  ? ? ?ASSESSMENT/PLAN:  ? ?Tobacco abuse ?No longer smoking  ? ?Family history of pulmonary embolism ?Had detailed discussion.  Given her family members had risk factors and she has never had blood clot decided to focus on prevention - see after visit summary ? ? ?Seborrheic dermatitis ?I think her rash is consistent with this.  Do not think is perioral dermatiis or rosacea.  Treat with low potency steroid ? ?Family history of dementia ?Decided to focus on prevention - see after visit summary  ?  ?Patient Instructions  ?Good to see you today - Thank you for coming in ? ?Things we discussed today: ? ?Face Rash ?I think this is mild seborrea dermatitis ?I would use OTC Hydrocortisone cream 1% once a day to every few days to keep it under control   ? ?Blood clot Risk ?You should exercise at least 20 minutes every day.    ?Choose something you like the most or hate the least.   ?Having a set time every day and having a partner will help you stick to it.    ?If you are too tired try to do at least 5 minutes, it often gets easier.  ?Whenever your travel - long car or bus or train rides wear compression stocking and stop and walk for 10 minutes every few hours ?Keep up to date with cancer screening - ? ?Dementia Risk ?- practice healthy heart practices - exercise cholesterol blood pressure control and mind engagement  ? ? ?Lind Covert, MD ?[facility]  ?

## 2021-08-16 NOTE — Assessment & Plan Note (Signed)
Decided to focus on prevention - see after visit summary  ?

## 2021-08-16 NOTE — Assessment & Plan Note (Signed)
No longer smoking 

## 2021-08-16 NOTE — Patient Instructions (Addendum)
Good to see you today - Thank you for coming in ? ?Things we discussed today: ? ?Face Rash ?I think this is mild seborrea dermatitis ?I would use OTC Hydrocortisone cream 1% once a day to every few days to keep it under control  ? ?Blood clot Risk ?You should exercise at least 20 minutes every day.    ?Choose something you like the most or hate the least.   ?Having a set time every day and having a partner will help you stick to it.    ?If you are too tired try to do at least 5 minutes, it often gets easier.  ?Whenever your travel - long car or bus or train rides wear compression stocking and stop and walk for 10 minutes every few hours ?Keep up to date with cancer screening - ? ?Dementia Risk ?- practice healthy heart practices - exercise cholesterol blood pressure control and mind engagement  ?

## 2021-08-16 NOTE — Assessment & Plan Note (Signed)
Had detailed discussion.  Given her family members had risk factors and she has never had blood clot decided to focus on prevention - see after visit summary ? ?

## 2021-08-23 ENCOUNTER — Ambulatory Visit: Payer: 59 | Admitting: Obstetrics & Gynecology

## 2021-08-25 ENCOUNTER — Other Ambulatory Visit (HOSPITAL_COMMUNITY)
Admission: RE | Admit: 2021-08-25 | Discharge: 2021-08-25 | Disposition: A | Discharge status: Home / Self Care | Payer: 59 | Source: Ambulatory Visit | Attending: Obstetrics & Gynecology | Admitting: Obstetrics & Gynecology

## 2021-08-25 ENCOUNTER — Encounter: Payer: Self-pay | Admitting: Obstetrics & Gynecology

## 2021-08-25 ENCOUNTER — Ambulatory Visit (INDEPENDENT_AMBULATORY_CARE_PROVIDER_SITE_OTHER): Payer: 59 | Admitting: Obstetrics & Gynecology

## 2021-08-25 VITALS — BP 116/74 | HR 75 | Resp 16 | Ht 65.75 in | Wt 176.0 lb

## 2021-08-25 DIAGNOSIS — Z113 Encounter for screening for infections with a predominantly sexual mode of transmission: Secondary | ICD-10-CM

## 2021-08-25 DIAGNOSIS — Z780 Asymptomatic menopausal state: Secondary | ICD-10-CM | POA: Diagnosis not present

## 2021-08-25 DIAGNOSIS — Z01419 Encounter for gynecological examination (general) (routine) without abnormal findings: Secondary | ICD-10-CM | POA: Insufficient documentation

## 2021-08-25 DIAGNOSIS — N9089 Other specified noninflammatory disorders of vulva and perineum: Secondary | ICD-10-CM | POA: Diagnosis not present

## 2021-08-25 NOTE — Progress Notes (Signed)
Bailey Hooper 1965/10/12 [id number]   History:    56 y.o. G2P2L1 Single.  Daughter and grand-children live with her.  She is taking care of her mother.   RP: Established patient presenting for annual gyn exam    HPI: Postmenopause, well on no HRT.  No PMB.  No pelvic pain. Condoms as needed.  Pap Neg in 08/2020.  Pap reflex/Gono-Chlam today.  Urine and bowel movements normal. Breasts normal.  Will schedule Mammo now. Physically active. BMI 28.62. Health labs with family physician.  Depression/Anxiety improved under the care of a Psychotherapist and Psychiatrist.  No suicidal ideation. Father Colon Ca. COLONOSCOPY benign polyps on 06-15-20.  Past medical history,surgical history, family history and social history were all reviewed and documented in the EPIC chart.  Gynecologic History Patient's last menstrual period was 02/12/2017.  Obstetric History OB History  Gravida Para Term Preterm AB Living  '3 1 1 '$ 0 2 1  SAB IAB Ectopic Multiple Live Births  2            # Outcome Date GA Lbr Len/2nd Weight Sex Delivery Anes PTL Lv  3 Term           2 SAB           1 SAB              ROS: A ROS was performed and pertinent positives and negatives are included in the history.  GENERAL: No fevers or chills. HEENT: No change in vision, no earache, sore throat or sinus congestion. NECK: No pain or stiffness. CARDIOVASCULAR: No chest pain or pressure. No palpitations. PULMONARY: No shortness of breath, cough or wheeze. GASTROINTESTINAL: No abdominal pain, nausea, vomiting or diarrhea, melena or bright red blood per rectum. GENITOURINARY: No urinary frequency, urgency, hesitancy or dysuria. MUSCULOSKELETAL: No joint or muscle pain, no back pain, no recent trauma. DERMATOLOGIC: No rash, no itching, no lesions. ENDOCRINE: No polyuria, polydipsia, no heat or cold intolerance. No recent change in weight. HEMATOLOGICAL: No anemia or easy bruising or bleeding. NEUROLOGIC: No headache, seizures, numbness,  tingling or weakness. PSYCHIATRIC: No depression, no loss of interest in normal activity or change in sleep pattern.     Exam:   BP 116/74   Pulse 75   Resp 16   Ht 5' 5.75" (1.67 m)   Wt 176 lb (79.8 kg)   LMP 02/12/2017   BMI 28.62 kg/m   Body mass index is 28.62 kg/m.  General appearance : Well developed well nourished female. No acute distress HEENT: Eyes: no retinal hemorrhage or exudates,  Neck supple, trachea midline, no carotid bruits, no thyroidmegaly Lungs: Clear to auscultation, no rhonchi or wheezes, or rib retractions  Heart: Regular rate and rhythm, no murmurs or gallops Breast:Examined in sitting and supine position were symmetrical in appearance, no palpable masses or tenderness,  no skin retraction, no nipple inversion, no nipple discharge, no skin discoloration, no axillary or supraclavicular lymphadenopathy Abdomen: no palpable masses or tenderness, no rebound or guarding Extremities: no edema or skin discoloration or tenderness  Pelvic: Vulva: Normal except for left perineal skin tag.             Vagina: No gross lesions or discharge  Cervix: No gross lesions or discharge.  Pap/Gono-Chlam done.  Uterus  AV, normal size, shape and consistency, non-tender and mobile  Adnexa  Without masses or tenderness  Anus: Normal   Assessment/Plan:  56 y.o. female for annual exam   1. Encounter for routine  gynecological examination with Papanicolaou smear of cervix Postmenopause, well on no HRT.  No PMB.  No pelvic pain. Condoms as needed.  Pap Neg in 08/2020.  Pap reflex/Gono-Chlam today.  Urine and bowel movements normal. Breasts normal.  Will schedule Mammo now. Physically active. BMI 28.62. Health labs with family physician.  Depression/Anxiety improved under the care of a Psychotherapist and Psychiatrist.  No suicidal ideation. Father Colon Ca. COLONOSCOPY benign polyps on 06-15-20. - Cytology - PAP( [facility])  2. Postmenopause Postmenopause, well on no HRT.  No  PMB.  No pelvic pain.   3. Screen for STD (sexually transmitted disease) - HIV antibody (with reflex) - RPR - Hepatitis B Surface AntiGEN - Hepatitis C Antibody - Cytology - PAP( [facility])- Gono/Chlam  4. Skin tag of vulva  F/U excision of left perineal skin tag.  Princess Bruins MD, 2:26 PM 08/25/2021

## 2021-08-28 LAB — CYTOLOGY - PAP
Adequacy: ABSENT
Chlamydia: NEGATIVE
Comment: NEGATIVE
Comment: NORMAL
Diagnosis: NEGATIVE
Neisseria Gonorrhea: NEGATIVE

## 2021-08-28 LAB — HIV ANTIBODY (ROUTINE TESTING W REFLEX): HIV 1&2 Ab, 4th Generation: NONREACTIVE

## 2021-08-28 LAB — HEPATITIS C ANTIBODY
Hepatitis C Ab: NONREACTIVE
SIGNAL TO CUT-OFF: 0.15 (ref <1.00)

## 2021-08-28 LAB — HEPATITIS B SURFACE ANTIGEN: Hepatitis B Surface Ag: NONREACTIVE

## 2021-08-28 LAB — RPR: RPR Ser Ql: NONREACTIVE

## 2021-08-30 ENCOUNTER — Telehealth: Payer: Self-pay | Admitting: Gastroenterology

## 2021-08-31 ENCOUNTER — Telehealth: Payer: Self-pay | Admitting: *Deleted

## 2021-08-31 NOTE — Telephone Encounter (Signed)
Dr. Dellis Filbert replied "Coconut oil works very well.  Just the normal Coconut oil that she can buy at the grocery store.  If she prefers, can also try Replens, a vaginal/vulvar moisturizer. "  Patient informed.

## 2021-08-31 NOTE — Telephone Encounter (Signed)
Patient was seen for annual exam on 08/25/21 and forgot to mention vaginal dryness and pain intercourse due to vaginal dryness. Patient said she would like to have something to help with daily vaginal dryness. She does not want anything with estrogen. Please advise

## 2021-08-31 NOTE — Telephone Encounter (Signed)
Called patient to let her know I am sending new instructions to her today through My Chart Looks like she was given 2 day Suprep

## 2021-09-09 IMAGING — MG MM DIGITAL SCREENING BILAT W/ TOMO AND CAD
8 series · 8 of 24 positions shown · non-contrast
Comparison: Previous exam(s).

CLINICAL DATA: Screening.

EXAM:
DIGITAL SCREENING BILATERAL MAMMOGRAM WITH TOMOSYNTHESIS AND CAD
TECHNIQUE: Bilateral screening digital craniocaudal and mediolateral oblique
mammograms were obtained. Bilateral screening digital breast
tomosynthesis was performed. The images were evaluated with
computer-aided detection.

[R MLO synth-2D]
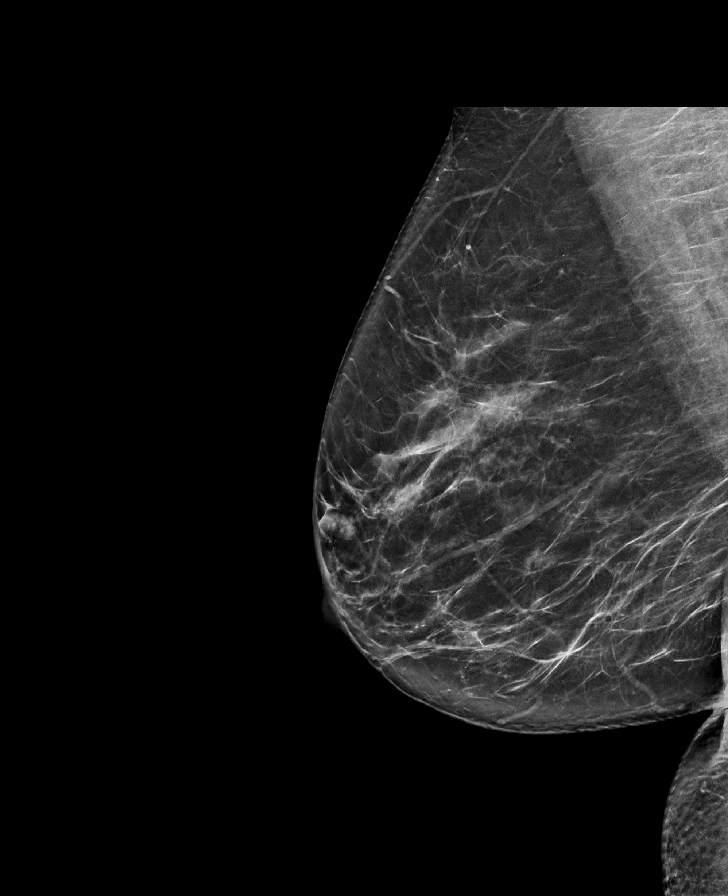

[L MLO synth-2D]
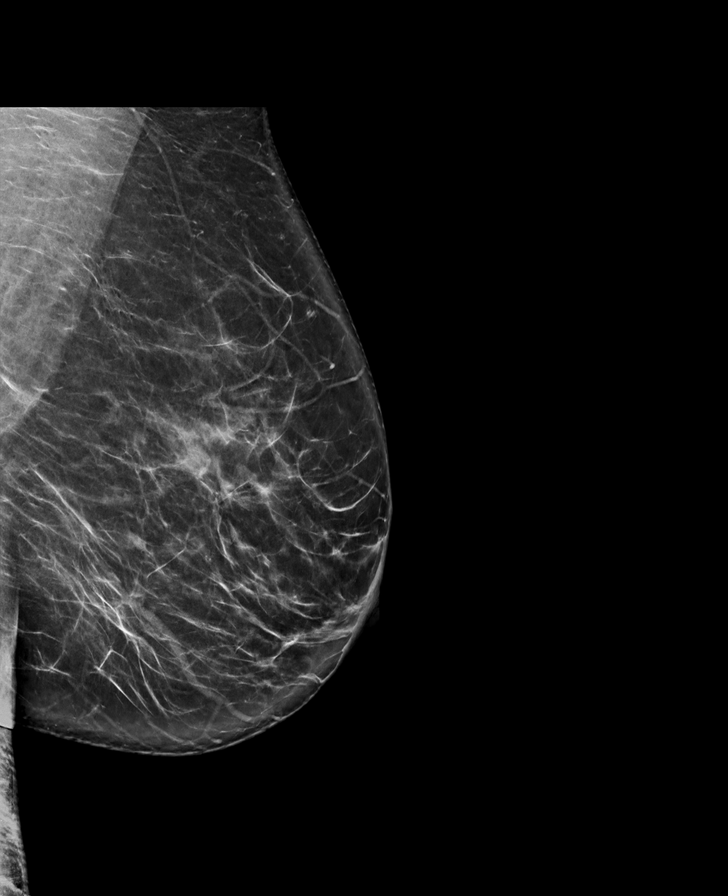

[R CC synth-2D]
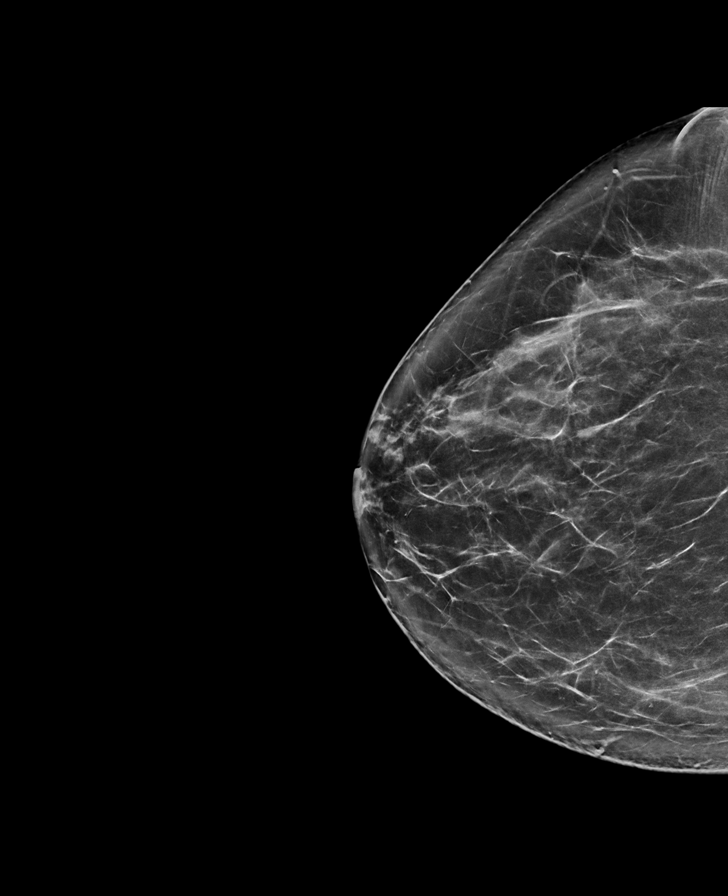

[L CC synth-2D]
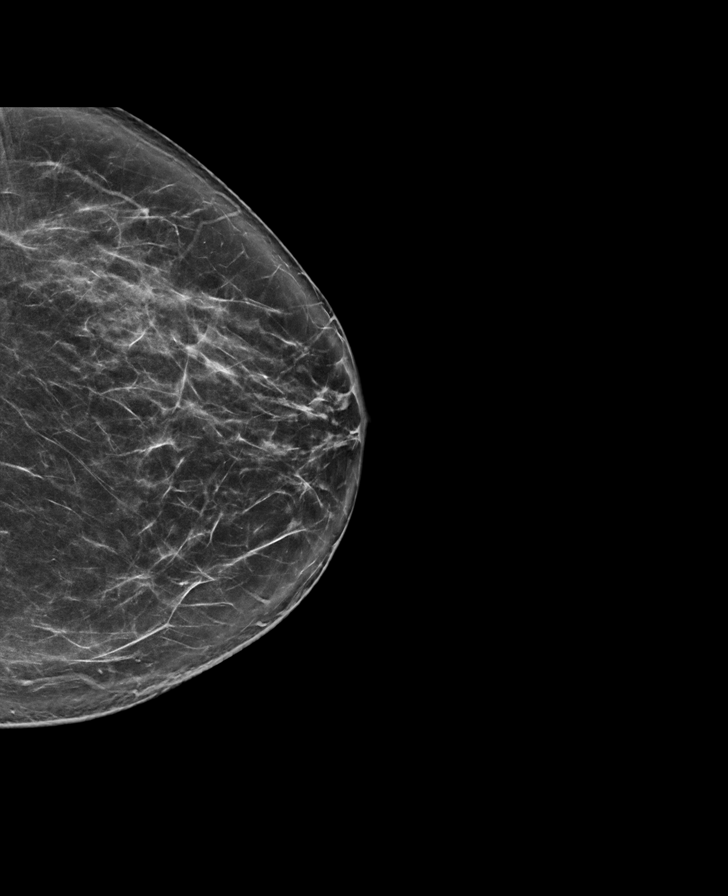

[R CC tomo · tomo slice 43/84.0]
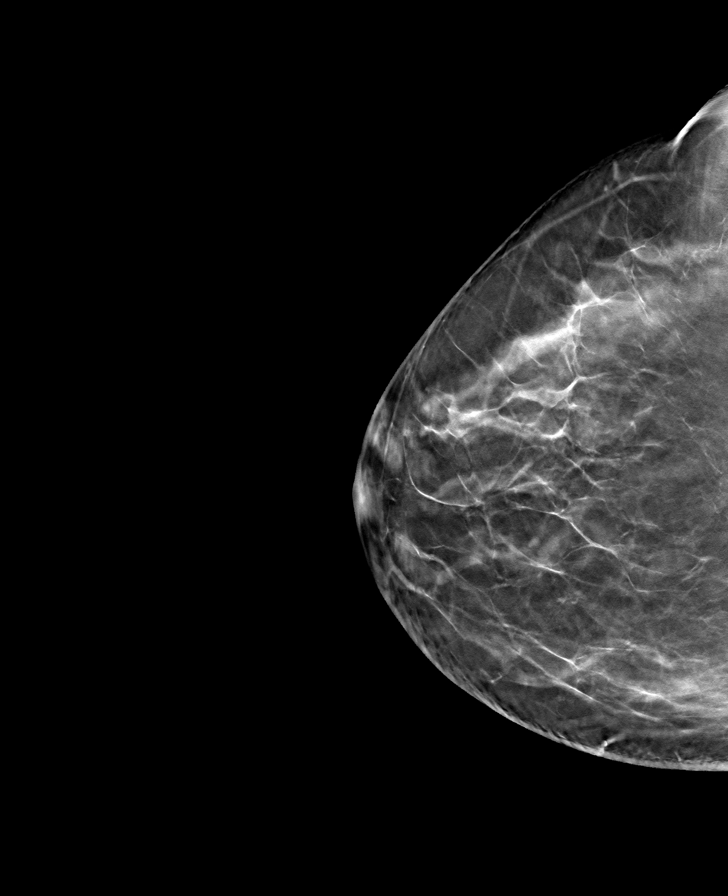

[L MLO tomo · tomo slice 45/88.0]
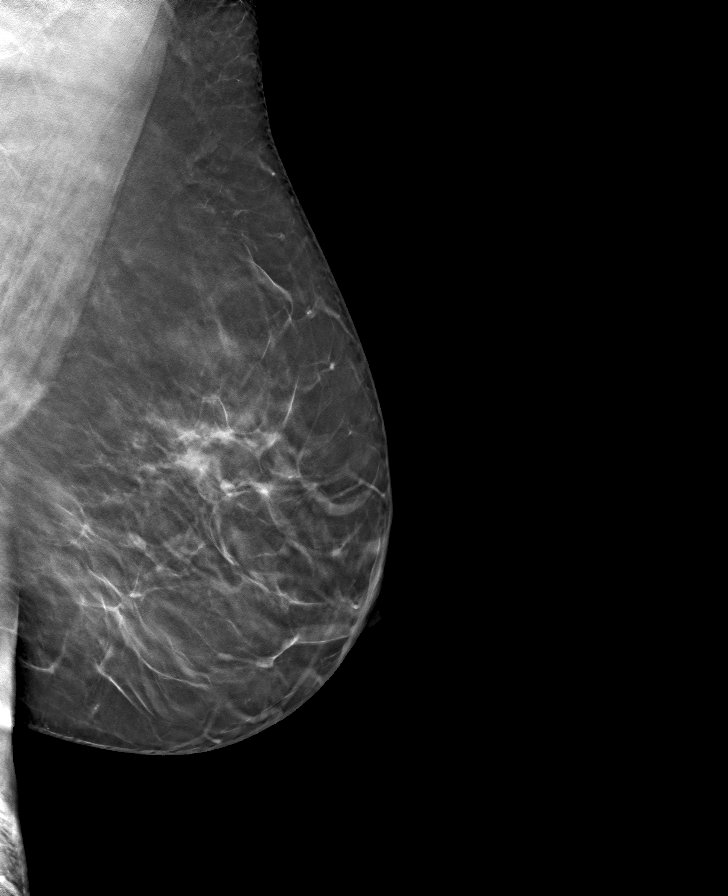

[R MLO tomo · tomo slice 43/84.0]
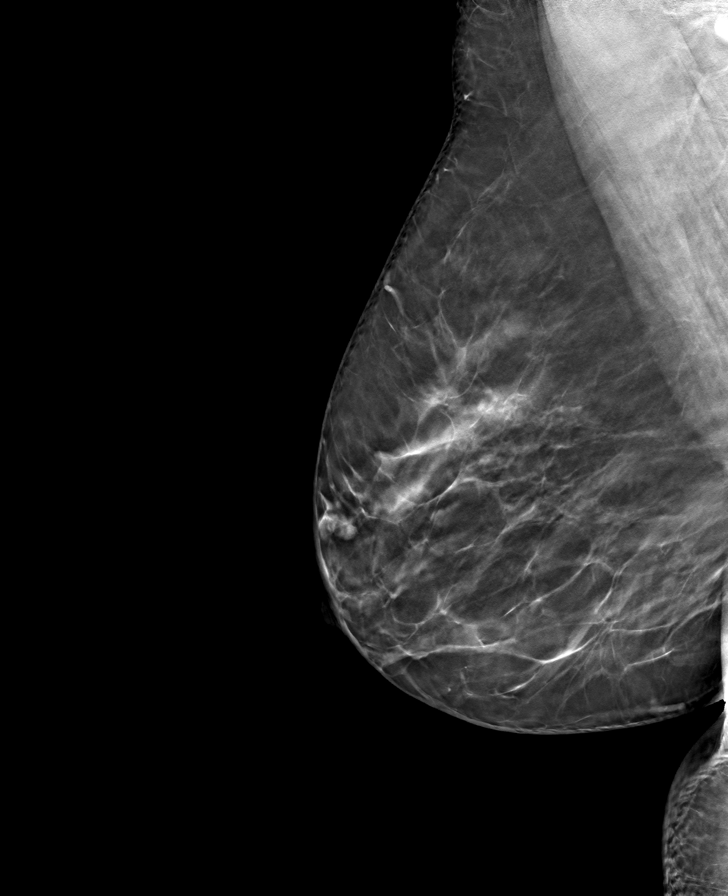

[L CC tomo · tomo slice 43/84.0]
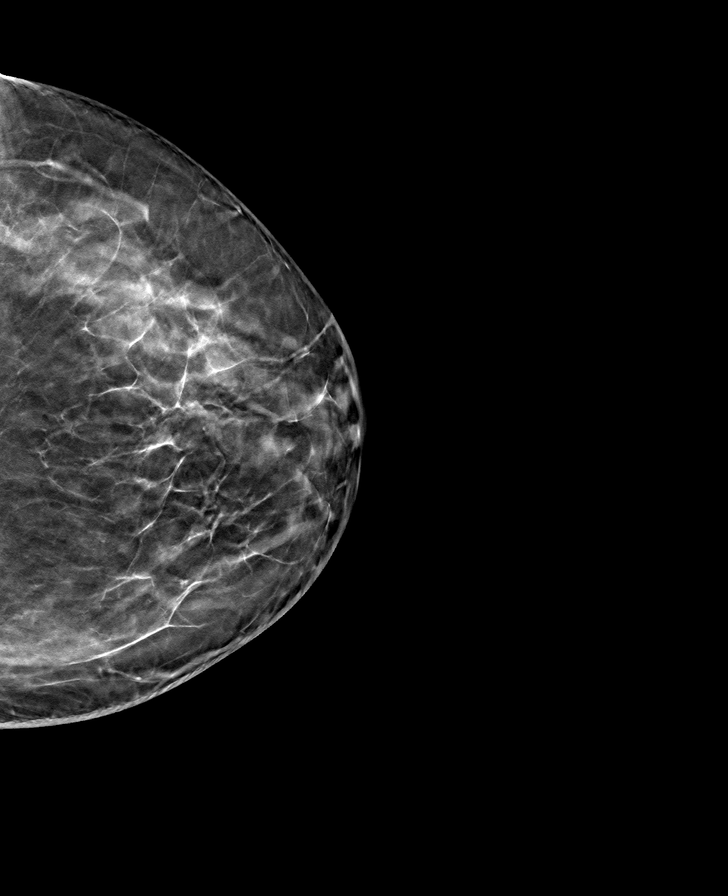

[8 of 24 positions shown; findings below may reference images not displayed]

ACR Breast Density Category b: There are scattered areas of
fibroglandular density.
FINDINGS: There are no findings suspicious for malignancy. The images were
evaluated with computer-aided detection.
IMPRESSION: No mammographic evidence of malignancy. A result letter of this
screening mammogram will be mailed directly to the patient.

RECOMMENDATION:
Screening mammogram in one year. (Code:[id number])

BI-RADS CATEGORY  1: Negative.

## 2021-09-12 ENCOUNTER — Encounter: Payer: Self-pay | Admitting: *Deleted

## 2021-09-18 ENCOUNTER — Ambulatory Visit: Payer: 59 | Admitting: Obstetrics & Gynecology

## 2021-09-30 ENCOUNTER — Telehealth: Payer: 59 | Admitting: Nurse Practitioner

## 2021-09-30 DIAGNOSIS — J329 Chronic sinusitis, unspecified: Secondary | ICD-10-CM

## 2021-09-30 DIAGNOSIS — B9689 Other specified bacterial agents as the cause of diseases classified elsewhere: Secondary | ICD-10-CM

## 2021-09-30 MED ORDER — MOMETASONE FUROATE 50 MCG/ACT NA SUSP: NASAL | 12 refills | Status: AC

## 2021-09-30 MED ORDER — DOXYCYCLINE HYCLATE 100 MG PO TABS: 100.0000 mg | ORAL_TABLET | Freq: Two times a day (BID) | ORAL | 0 refills | Status: DC

## 2021-09-30 NOTE — Progress Notes (Signed)
Virtual Visit Consent   Bailey Hooper, you are scheduled for a virtual visit with a [facility] provider today. Just as with appointments in the office, your consent must be obtained to participate. Your consent will be active for this visit and any virtual visit you may have with one of our providers in the next 365 days. If you have a MyChart account, a copy of this consent can be sent to you electronically.  As this is a virtual visit, video technology does not allow for your provider to perform a traditional examination. This may limit your provider's ability to fully assess your condition. If your provider identifies any concerns that need to be evaluated in person or the need to arrange testing (such as labs, EKG, etc.), we will make arrangements to do so. Although advances in technology are sophisticated, we cannot ensure that it will always work on either your end or our end. If the connection with a video visit is poor, the visit may have to be switched to a telephone visit. With either a video or telephone visit, we are not always able to ensure that we have a secure connection.  By engaging in this virtual visit, you consent to the provision of healthcare and authorize for your insurance to be billed (if applicable) for the services provided during this visit. Depending on your insurance coverage, you may receive a charge related to this service.  I need to obtain your verbal consent now. Are you willing to proceed with your visit today? Kmiyah Hooper has provided verbal consent on 09/30/2021 for a virtual visit (video or telephone). Claiborne Rigg, NP  Date: 09/30/2021 8:25 AM  Virtual Visit via Video Note   I, Claiborne Rigg, connected with  Bailey Hooper  ([id number], September 13, 1965) on 09/30/21 at  8:00 AM EDT by a video-enabled telemedicine application and verified that I am speaking with the correct person using two identifiers.  Location: Patient: Virtual Visit Location Patient:  Home Provider: Virtual Visit Location Provider: Home Office   I discussed the limitations of evaluation and management by telemedicine and the availability of in person appointments. The patient expressed understanding and agreed to proceed.    History of Present Illness: Bailey Hooper is a 56 y.o. who identifies as a female who was assigned female at birth, and is being seen today for bacterial sinusitis.  Onset of frontal and maxillary pressure, sore throat, purulent sinus drainage, productive cough, chills and nasal congestion.  She does have a history of allergies and is taking her allergy medications as prescribed as well as Coricidin for cough with no relief of symptoms.  She has had sinus infections in the past and states current symptoms are very similar.  She is also requesting a medication for hot flashes.  Is taking gabapentin, black cohosh and Paxil in the past with no relief.  States her PCP and gynecologist are reluctant to prescribe her estrogen/estradiol.  I have instructed her that I am not able to prescribe any new medications for hot flashes and have deferred her back to PCP and/or gynecology.   Problems:  Patient Active Problem List   Diagnosis Date Noted   Family history of pulmonary embolism 08/16/2021   Family history of dementia 08/16/2021   Bartholin cyst 09/10/2020   NAFLD (nonalcoholic fatty liver disease) 40/98/1191   H/O colonoscopy 06/16/2020   Hot flashes 04/27/2019   Bipolar disorder (HCC) 01/18/2019   Asthma 01/18/2019   Hyperlipidemia 07/14/2018   Tobacco abuse 11/29/2017  Benign paroxysmal positional vertigo 11/29/2017   Seborrheic dermatitis 12/17/2013   Major depressive disorder, recurrent episode, moderate (HCC) 06/28/2009   GENITAL HERPES 01/17/2009   Migraine headache 01/17/2009   Essential hypertension, benign 01/17/2009   GERD (gastroesophageal reflux disease) 01/17/2009   Irritable bowel syndrome 01/17/2009   Heart murmur 01/17/2009     Allergies:  Allergies  Allergen Reactions   Lisinopril Palpitations and Cough    Per pt report   Lamictal [Lamotrigine] Hives   Penicillins Rash    Vesicles, denies SOB or breathing issues   Erythromycin Nausea And Vomiting   Medications:  Current Outpatient Medications:    doxycycline (VIBRA-TABS) 100 MG tablet, Take 1 tablet (100 mg total) by mouth 2 (two) times daily., Disp: 20 tablet, Rfl: 0   albuterol (VENTOLIN HFA) 108 (90 Base) MCG/ACT inhaler, Inhale 2 puffs into the lungs every 6 (six) hours as needed for wheezing., Disp: 18 g, Rfl: 2   AMBULATORY NON FORMULARY MEDICATION, GI Cock tail: 90 ml viscous lidocaine, 90 ml 10/mg/ 5 ml dicyclomine, 270 ml maalox (Patient not taking: Reported on 08/25/2021), Disp: 450 mL, Rfl: 0   amLODipine (NORVASC) 10 MG tablet, Take 1 tablet (10 mg total) by mouth daily., Disp: 90 tablet, Rfl: 3   azelastine (OPTIVAR) 0.05 % ophthalmic solution, Place 1 drop into both eyes 2 (two) times daily., Disp: 6 mL, Rfl: 12   clonazePAM (KLONOPIN) 0.5 MG tablet, Take 0.5 mg by mouth daily as needed., Disp: , Rfl:    diclofenac (VOLTAREN) 75 MG EC tablet, TAKE (1) TABLET TWICE DAILY., Disp: 28 tablet, Rfl: 0   diclofenac Sodium (VOLTAREN) 1 % GEL, APPLY 4 GRAMS TOPICALLY 4 TIMES A DAY., Disp: 200 g, Rfl: 0   dicyclomine (BENTYL) 20 MG tablet, 1/2 to 1 tab 3 times daily before meals and at bedtime, Disp: 90 tablet, Rfl: 1   fluocinonide (LIDEX) 0.05 % external solution, Apply 1 application topically 2 (two) times daily., Disp: 60 mL, Rfl: 2   lansoprazole (PREVACID) 30 MG capsule, Take 1 capsule (30 mg total) by mouth daily at 12 noon., Disp: 90 capsule, Rfl: 3   loratadine (CLARITIN) 10 MG tablet, Take 1 tablet (10 mg total) by mouth daily., Disp: 30 tablet, Rfl: 11   losartan (COZAAR) 25 MG tablet, Take 1 tablet (25 mg total) by mouth at bedtime., Disp: 90 tablet, Rfl: 3   metoprolol succinate (TOPROL-XL) 50 MG 24 hr tablet, Take 1 tablet (50 mg total) by mouth  at bedtime. Take with or immediately following a meal., Disp: 90 tablet, Rfl: 1   mirtazapine (REMERON) 45 MG tablet, Take 45 mg by mouth at bedtime., Disp: , Rfl:    mometasone (NASONEX) 50 MCG/ACT nasal spray, 2 sprays each nostril daily, Disp: 17 g, Rfl: 12   Multiple Minerals-Vitamins (CAL MAG ZINC +D3) TABS, Take 1 tablet by mouth daily., Disp: , Rfl:    Multiple Vitamin (MULTIVITAMIN) capsule, Take 1 capsule by mouth daily., Disp: , Rfl:    ondansetron (ZOFRAN-ODT) 4 MG disintegrating tablet, DISSOLVE 1 TABLET UNDER THE TONGUE EVERY 6 HOURS AS NEEDED FOR NAUSEA AND VOMITING., Disp: 30 tablet, Rfl: 0   QUEtiapine (SEROQUEL) 25 MG tablet, Take 25 mg by mouth at bedtime., Disp: , Rfl:    SUMAtriptan (IMITREX) 50 MG tablet, May repeat in 2 hours if headache persists or recurs with max of 100 mg in 24 hours., Disp: 9 tablet, Rfl: 11   traZODone (DESYREL) 50 MG tablet, Take 0.5-1 tablets (25-50 mg  total) by mouth at bedtime as needed for sleep., Disp: 30 tablet, Rfl: 0  Observations/Objective: Patient is well-developed, well-nourished in no acute distress.  Resting comfortably  at home.  Head is normocephalic, atraumatic.  No labored breathing.  Speech is clear and coherent with logical content.  Patient is alert and oriented at baseline.    Assessment and Plan: 1. Bacterial sinusitis - doxycycline (VIBRA-TABS) 100 MG tablet; Take 1 tablet (100 mg total) by mouth 2 (two) times daily.  Dispense: 20 tablet; Refill: 0 - mometasone (NASONEX) 50 MCG/ACT nasal spray; 2 sprays each nostril daily  Dispense: 17 g; Refill: 12   Follow Up Instructions: I discussed the assessment and treatment plan with the patient. The patient was provided an opportunity to ask questions and all were answered. The patient agreed with the plan and demonstrated an understanding of the instructions.  A copy of instructions were sent to the patient via MyChart unless otherwise noted below.    The patient was advised to  call back or seek an in-person evaluation if the symptoms worsen or if the condition fails to improve as anticipated.  Time:  I spent 12 minutes with the patient via telehealth technology discussing the above problems/concerns.    Claiborne Rigg, NP

## 2021-10-31 ENCOUNTER — Encounter: Payer: Self-pay | Admitting: Family Medicine

## 2021-10-31 ENCOUNTER — Other Ambulatory Visit: Payer: Self-pay | Admitting: Family Medicine

## 2021-10-31 ENCOUNTER — Encounter: Payer: Self-pay | Admitting: Gastroenterology

## 2021-10-31 ENCOUNTER — Other Ambulatory Visit: Payer: Self-pay | Admitting: Nurse Practitioner

## 2021-10-31 DIAGNOSIS — J329 Chronic sinusitis, unspecified: Secondary | ICD-10-CM

## 2021-10-31 DIAGNOSIS — F419 Anxiety disorder, unspecified: Secondary | ICD-10-CM

## 2021-10-31 MED ORDER — TRAZODONE HCL 50 MG PO TABS: 25.0000 mg | ORAL_TABLET | Freq: Every evening | ORAL | 1 refills | Status: AC | PRN

## 2021-10-31 MED ORDER — MIRTAZAPINE 45 MG PO TABS: 45.0000 mg | ORAL_TABLET | Freq: Every day | ORAL | 1 refills | Status: AC

## 2021-10-31 NOTE — Telephone Encounter (Signed)
Sent my chart message. Refilled trazodone and remeron (mirtazapine). I cannot refill Seroquel/Clonazepam as prescribed by others. Must have visit to discuss doxycycline. Sent mychart message with these details. If patient calls or messages, please offer her a office or virtual visit. She was to be seen in February and has not been seen since January.  Dorris Singh, MD  Family Medicine Teaching Service

## 2021-10-31 NOTE — Telephone Encounter (Signed)
Patient indicates that she has been attempting to reach her care partner regarding procedure since yesterday but has yet to get back in touch with them. She has also unsuccessfully tried to reach our office multiple times, so she sent mychart message. She wants to reschedule her colonoscopy (scheduled for 11/01/21) to a different day so she can arrange another ride. I have given her the first available date in August, but she prefers a morning appointment. Therefore, she has been rescheduled for 8:30 am on 12/22/21.

## 2021-11-01 ENCOUNTER — Encounter: Payer: 59 | Admitting: Gastroenterology

## 2021-11-02 ENCOUNTER — Telehealth: Payer: Self-pay | Admitting: Family Medicine

## 2021-11-02 NOTE — Telephone Encounter (Signed)
**  After Hours/ Emergency Line Call**  Received a call to report that Bailey Hooper reports that she is having significant sinus drainage and pain x4 days. She has been taking OTC sinus medications, Tylenol, cough drops, and drinking hot tea and water.   Endorsing sinus pain, increased drainage and sometimes congestion in her head, headache.  Reports that she felt hot but has not had any measured fevers.  Recommended that patient continue with over the counter management at this time and add Flonase 1 spray in each nare in the morning and at night as well as Mucinex.  Red flags discussed, patient thinks she needs antibiotics and will go to get evaluated by urgent care in the morning as she lives in [place].  Will forward to PCP.  Rise Patience, DO  PGY-3, [facility] 11/02/2021 11:01 PM

## 2021-12-01 ENCOUNTER — Other Ambulatory Visit: Payer: Self-pay

## 2021-12-01 DIAGNOSIS — J45909 Unspecified asthma, uncomplicated: Secondary | ICD-10-CM

## 2021-12-01 MED ORDER — ALBUTEROL SULFATE HFA 108 (90 BASE) MCG/ACT IN AERS: 2.0000 | INHALATION_SPRAY | Freq: Four times a day (QID) | RESPIRATORY_TRACT | 2 refills | Status: DC | PRN

## 2021-12-08 ENCOUNTER — Other Ambulatory Visit: Payer: Self-pay | Admitting: *Deleted

## 2021-12-08 DIAGNOSIS — I10 Essential (primary) hypertension: Secondary | ICD-10-CM

## 2021-12-08 MED ORDER — METOPROLOL SUCCINATE ER 50 MG PO TB24: 50.0000 mg | ORAL_TABLET | Freq: Every day | ORAL | 1 refills | Status: DC

## 2021-12-22 ENCOUNTER — Encounter: Payer: 59 | Admitting: Gastroenterology

## 2022-03-07 ENCOUNTER — Other Ambulatory Visit: Payer: Self-pay | Admitting: Family Medicine

## 2022-03-07 DIAGNOSIS — Z1231 Encounter for screening mammogram for malignant neoplasm of breast: Secondary | ICD-10-CM

## 2022-03-09 ENCOUNTER — Encounter: Payer: Self-pay | Admitting: Family Medicine

## 2022-03-09 ENCOUNTER — Ambulatory Visit (INDEPENDENT_AMBULATORY_CARE_PROVIDER_SITE_OTHER): Payer: 59 | Admitting: Family Medicine

## 2022-03-09 VITALS — BP 175/87 | HR 58 | Ht 66.0 in | Wt 176.0 lb

## 2022-03-09 DIAGNOSIS — Z1211 Encounter for screening for malignant neoplasm of colon: Secondary | ICD-10-CM | POA: Diagnosis not present

## 2022-03-09 DIAGNOSIS — I10 Essential (primary) hypertension: Secondary | ICD-10-CM

## 2022-03-09 DIAGNOSIS — F331 Major depressive disorder, recurrent, moderate: Secondary | ICD-10-CM

## 2022-03-09 DIAGNOSIS — E782 Mixed hyperlipidemia: Secondary | ICD-10-CM

## 2022-03-09 DIAGNOSIS — R200 Anesthesia of skin: Secondary | ICD-10-CM

## 2022-03-09 DIAGNOSIS — Z9889 Other specified postprocedural states: Secondary | ICD-10-CM | POA: Diagnosis not present

## 2022-03-09 DIAGNOSIS — R202 Paresthesia of skin: Secondary | ICD-10-CM

## 2022-03-09 DIAGNOSIS — K760 Fatty (change of) liver, not elsewhere classified: Secondary | ICD-10-CM | POA: Diagnosis not present

## 2022-03-09 MED ORDER — TRIAMCINOLONE ACETONIDE 0.5 % EX OINT: 1.0000 | TOPICAL_OINTMENT | Freq: Two times a day (BID) | CUTANEOUS | 0 refills | Status: DC

## 2022-03-09 MED ORDER — LIDOCAINE VISCOUS HCL 2 % MT SOLN: 30.0000 mL | Freq: Two times a day (BID) | OROMUCOSAL | 1 refills | Status: DC | PRN

## 2022-03-09 MED ORDER — CLONAZEPAM 0.5 MG PO TABS: 0.5000 mg | ORAL_TABLET | Freq: Every day | ORAL | 0 refills | Status: AC | PRN

## 2022-03-09 MED ORDER — QUETIAPINE FUMARATE 25 MG PO TABS: 25.0000 mg | ORAL_TABLET | Freq: Every day | ORAL | 0 refills | Status: DC

## 2022-03-09 MED ORDER — AMLODIPINE-OLMESARTAN 10-40 MG PO TABS: 1.0000 | ORAL_TABLET | Freq: Every day | ORAL | 3 refills | Status: DC

## 2022-03-09 NOTE — Assessment & Plan Note (Signed)
Not at goal.  Discontinue losartan and amlodipine.  Start combination olmesartan amlodipine increased dose.  Follow-up in about a month for repeat metabolic panel.  BMP today.

## 2022-03-09 NOTE — Progress Notes (Signed)
SUBJECTIVE:   CHIEF COMPLAINT: hand tingling and follow up  HPI:   Bailey Hooper is a 56 y.o.  with history notable for essential hypertension, anxiety, carpal tunnel syndrome presenting for follow up.  The patient reports she is doing okay.  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  She works 8 hours a week.  She is approved for section 8 housing.  She is still living with her daughter who has 2 children.  Her mom was living with her until recently.  Her mom had a prolonged hospital stay earlier this year and her mother is now living on her own.  There have been some significant stressors around her mother's health and mental health over the last year.  This makes is pleased that she recently got access to insurance.  Her Friday health plan ended in August.  The patient reports intermittent right hand and right foot tingling at times.  This is intermittent and randomly throughout the day.  She does intermittently have back pain.  She also has some intermittent neck pain.  She has a history of carpal tunnel and is right-hand dominant.  Her paresthesias are in her forearm not her hand.  She denies radiating pain, bowel or bladder incontinence or other new symptoms.  No speech or vision changes.  This has been ongoing for some time.  Patient reports compliance with medications.  She dislikes the number of medication she takes per day.  She denies headache, chest pain, dyspnea.  The patient is taking several OTC supplements. Medication list is updated.   PERTINENT  PMH / PSH/Family/Social History : Updated and reviewed as appropriate  OBJECTIVE:   BP (!) 175/87   Pulse (!) 58   Ht '5\' 6"'$  (1.676 m)   Wt 176 lb (79.8 kg)   LMP 02/12/2017   SpO2 100%   BMI 28.41 kg/m   Today's weight:  Last Weight  Most recent update: 03/09/2022 11:28 AM    Weight  79.8 kg (176 lb)            Review of prior weights: Filed Weights   03/09/22 1127  Weight: 176 lb (79.8  kg)     Cardiac: Regular rate and rhythm. Normal S1/S2. No murmurs, rubs, or gallops appreciated. Lungs: Clear bilaterally to ascultation.   Psych: Pleasant and appropriate  Feet 2+ DP pulses No ulcers No skin breakdown UE Negative spurlings No deformity, redness, edema 5/5 UE strength  Normal sensation to light touch    ASSESSMENT/PLAN:   Major depressive disorder, recurrent episode, moderate ([place]) Follows with psychiatry but does not have an appointment due to insurance lapse until January.  Refilled Seroquel and clonazepam for 1 month.  PDMP reviewed.  H/O colonoscopy Patient has had a significant issue calling [facility] and been unable to contact them.  Prefers a different provider.  [facility] to [facility].  NAFLD (nonalcoholic fatty liver disease) Hepatic function panel and GGT today.  Essential hypertension, benign Not at goal.  Discontinue losartan and amlodipine.  Start combination olmesartan amlodipine increased dose.  Follow-up in about a month for repeat metabolic panel.  BMP today.   RUE Tingling Unclear if CTS or possibly radiculopathy B12 ordered NCV ordered through Neurology   Hot Flashes Message sent about Veozah Maca root--appears to be safe in moderation    HCM Referred to [facility] for CSY Lipid today Declined COVID vaccine     Dorris Singh, MD  [facility] Medicine Teaching  [place]

## 2022-03-09 NOTE — Assessment & Plan Note (Signed)
Hepatic function panel and GGT today.

## 2022-03-09 NOTE — Patient Instructions (Addendum)
It was wonderful to see you today.  Please bring ALL of your medications with you to every visit.   Today we talked about:  I will check to see if Dr. Benjamine Mola takes Medicaid   I will message you about the new menopause medication (Veozah) and Maca Root  I have placed a new referral to [facility]  Please STOP your losartan and amlodipine--I have combined this into ONE pill called Azor (olmesartan and amlodipine)--if this is more than a few dollars call me immediately  I sent in your Seroquel and clonazepam--your next fill will need to be from Dr. Altamese Fairview  For your hand-I have sent labs and ordered a nerve conduction test--you should be called about this   I sent in a cream for your rash on your neck--please use twice a day     Please follow up in  months   Thank you for choosing [facility].   Please call [phone] with any questions about today's appointment.  Please be sure to schedule follow up at the front  desk before you leave today.   Dorris Singh, MD  Family Medicine

## 2022-03-09 NOTE — Assessment & Plan Note (Signed)
Follows with psychiatry but does not have an appointment due to insurance lapse until January.  Refilled Seroquel and clonazepam for 1 month.  PDMP reviewed.

## 2022-03-09 NOTE — Assessment & Plan Note (Signed)
Patient has had a significant issue calling [facility] and been unable to contact them.  Prefers a different provider.  Referral sent to [facility].

## 2022-03-10 LAB — GAMMA GT: GGT: 19 IU/L (ref 0–60)

## 2022-03-10 LAB — HEPATIC FUNCTION PANEL
ALT: 14 IU/L (ref 0–32)
AST: 15 IU/L (ref 0–40)
Albumin: 4.5 g/dL (ref 3.8–4.9)
Alkaline Phosphatase: 116 IU/L (ref 44–121)
Bilirubin Total: <0.2 mg/dL (ref 0.0–1.2)
Bilirubin, Direct: <0.1 mg/dL (ref 0.00–0.40)
Total Protein: 7.3 g/dL (ref 6.0–8.5)

## 2022-03-10 LAB — HEMOGLOBIN A1C
Est. average glucose Bld gHb Est-mCnc: 134 mg/dL
Hgb A1c MFr Bld: 6.3 % — ABNORMAL HIGH (ref 4.8–5.6)

## 2022-03-10 LAB — VITAMIN B12: Vitamin B-12: 517 pg/mL (ref 232–1245)

## 2022-03-10 LAB — LIPID PANEL
Chol/HDL Ratio: 3.3 ratio (ref 0.0–4.4)
Cholesterol, Total: 163 mg/dL (ref 100–199)
HDL: 49 mg/dL (ref >39)
LDL Chol Calc (NIH): 96 mg/dL (ref 0–99)
Triglycerides: 98 mg/dL (ref 0–149)
VLDL Cholesterol Cal: 18 mg/dL (ref 5–40)

## 2022-03-10 LAB — BASIC METABOLIC PANEL
BUN/Creatinine Ratio: 14 (ref 9–23)
BUN: 8 mg/dL (ref 6–24)
CO2: 25 mmol/L (ref 20–29)
Calcium: 9.3 mg/dL (ref 8.7–10.2)
Chloride: 103 mmol/L (ref 96–106)
Creatinine, Ser: 0.59 mg/dL (ref 0.57–1.00)
Glucose: 95 mg/dL (ref 70–99)
Potassium: 4.4 mmol/L (ref 3.5–5.2)
Sodium: 143 mmol/L (ref 134–144)
eGFR: 106 mL/min/{1.73_m2} (ref >59)

## 2022-03-12 ENCOUNTER — Telehealth: Payer: Self-pay | Admitting: Family Medicine

## 2022-03-12 MED ORDER — ATORVASTATIN CALCIUM 10 MG PO TABS: 10.0000 mg | ORAL_TABLET | Freq: Every day | ORAL | 3 refills | Status: DC

## 2022-03-12 NOTE — Telephone Encounter (Signed)
Called and discussed results. ASCVD risk is 24%, statin started.  Dorris Singh, MD  Family Medicine Teaching Service

## 2022-03-13 ENCOUNTER — Encounter: Payer: Self-pay | Admitting: Family Medicine

## 2022-03-13 ENCOUNTER — Encounter: Payer: Self-pay | Admitting: *Deleted

## 2022-03-13 DIAGNOSIS — H8111 Benign paroxysmal vertigo, right ear: Secondary | ICD-10-CM

## 2022-03-13 DIAGNOSIS — L84 Corns and callosities: Secondary | ICD-10-CM

## 2022-03-15 ENCOUNTER — Ambulatory Visit: Payer: Medicaid Other

## 2022-03-19 NOTE — Telephone Encounter (Signed)
Podiatry office is not accepting Medicaid. Forwarding to referral coordinator.   Talbot Grumbling, RN

## 2022-03-20 ENCOUNTER — Telehealth: Payer: Self-pay | Admitting: Family Medicine

## 2022-03-20 MED ORDER — LIDOCAINE VISCOUS HCL 2 % MT SOLN: 30.0000 mL | Freq: Two times a day (BID) | OROMUCOSAL | 0 refills | Status: DC | PRN

## 2022-03-20 NOTE — Telephone Encounter (Signed)
**  After Hours/ Emergency Line Call**  Patient calls after-hours line regarding GI cocktail. Apparently this was sent to [facility] (her old pharmacy) several days ago and needs to be sent to [facility] instead.  She was seen in Urgent Care today for several days of chest discomfort, which she attributes to her reflux. They stated her vitals were normal but she should be seen in the ED instead of urgent care for this issue. She denies dyspnea, diaphoresis, or other complaints.   Patient feels very certain she has dealt with this before and it improves with GI cocktail. I have sent it to her pharmacy but patient was counseled extensively on ED precautions. If symptoms do not improve within 1 hour of GI cocktail, she is to proceed straight to the ED for evaluation. She is agreeable with this plan.  Alcus Dad, MD PGY-3, [facility] 03/20/2022 5:20 PM

## 2022-03-27 ENCOUNTER — Ambulatory Visit: Payer: Self-pay | Admitting: Podiatry

## 2022-04-06 ENCOUNTER — Ambulatory Visit (INDEPENDENT_AMBULATORY_CARE_PROVIDER_SITE_OTHER): Payer: Medicaid Other | Admitting: Clinical

## 2022-04-06 ENCOUNTER — Encounter (HOSPITAL_COMMUNITY): Payer: Self-pay

## 2022-04-06 DIAGNOSIS — F411 Generalized anxiety disorder: Secondary | ICD-10-CM

## 2022-04-06 DIAGNOSIS — F3132 Bipolar disorder, current episode depressed, moderate: Secondary | ICD-10-CM
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
## 2022-04-12 ENCOUNTER — Ambulatory Visit: Payer: Medicaid Other | Admitting: Podiatry

## 2022-04-12 VITALS — BP 152/89

## 2022-04-12 DIAGNOSIS — B351 Tinea unguium: Secondary | ICD-10-CM | POA: Diagnosis not present

## 2022-04-12 DIAGNOSIS — M216X2 Other acquired deformities of left foot: Secondary | ICD-10-CM | POA: Diagnosis not present

## 2022-04-12 DIAGNOSIS — L84 Corns and callosities: Secondary | ICD-10-CM

## 2022-04-12 DIAGNOSIS — L600 Ingrowing nail: Secondary | ICD-10-CM

## 2022-04-12 DIAGNOSIS — M79671 Pain in right foot: Secondary | ICD-10-CM

## 2022-04-12 DIAGNOSIS — M21611 Bunion of right foot: Secondary | ICD-10-CM

## 2022-04-12 MED ORDER — CICLOPIROX 8 % EX SOLN: Freq: Every day | CUTANEOUS | 0 refills | Status: DC

## 2022-04-12 NOTE — Patient Instructions (Signed)
For inserts I like powersteps, superfeet, aetrex  You can add a metatarsal pad to the insert to help as well  Plantar Fasciitis (Heel Spur Syndrome) with Rehab The plantar fascia is a fibrous, ligament-like, soft-tissue structure that spans the bottom of the foot. Plantar fasciitis is a condition that causes pain in the foot due to inflammation of the tissue. SYMPTOMS  Pain and tenderness on the underneath side of the foot. Pain that worsens with standing or walking. CAUSES  Plantar fasciitis is caused by irritation and injury to the plantar fascia on the underneath side of the foot. Common mechanisms of injury include: Direct trauma to bottom of the foot. Damage to a small nerve that runs under the foot where the main fascia attaches to the heel bone. Stress placed on the plantar fascia due to bone spurs. RISK INCREASES WITH:  Activities that place stress on the plantar fascia (running, jumping, pivoting, or cutting). Poor strength and flexibility. Improperly fitted shoes. Tight calf muscles. Flat feet. Failure to warm-up properly before activity. Obesity. PREVENTION Warm up and stretch properly before activity. Allow for adequate recovery between workouts. Maintain physical fitness: Strength, flexibility, and endurance. Cardiovascular fitness. Maintain a health body weight. Avoid stress on the plantar fascia. Wear properly fitted shoes, including arch supports for individuals who have flat feet.  PROGNOSIS  If treated properly, then the symptoms of plantar fasciitis usually resolve without surgery. However, occasionally surgery is necessary.  RELATED COMPLICATIONS  Recurrent symptoms that may result in a chronic condition. Problems of the lower back that are caused by compensating for the injury, such as limping. Pain or weakness of the foot during push-off following surgery. Chronic inflammation, scarring, and partial or complete fascia tear, occurring more often from  repeated injections.  TREATMENT  Treatment initially involves the use of ice and medication to help reduce pain and inflammation. The use of strengthening and stretching exercises may help reduce pain with activity, especially stretches of the Achilles tendon. These exercises may be performed at home or with a therapist. Your caregiver may recommend that you use heel cups of arch supports to help reduce stress on the plantar fascia. Occasionally, corticosteroid injections are given to reduce inflammation. If symptoms persist for greater than 6 months despite non-surgical (conservative), then surgery may be recommended.   MEDICATION  If pain medication is necessary, then nonsteroidal anti-inflammatory medications, such as aspirin and ibuprofen, or other minor pain relievers, such as acetaminophen, are often recommended. Do not take pain medication within 7 days before surgery. Prescription pain relievers may be given if deemed necessary by your caregiver. Use only as directed and only as much as you need. Corticosteroid injections may be given by your caregiver. These injections should be reserved for the most serious cases, because they may only be given a certain number of times.  HEAT AND COLD Cold treatment (icing) relieves pain and reduces inflammation. Cold treatment should be applied for 10 to 15 minutes every 2 to 3 hours for inflammation and pain and immediately after any activity that aggravates your symptoms. Use ice packs or massage the area with a piece of ice (ice massage). Heat treatment may be used prior to performing the stretching and strengthening activities prescribed by your caregiver, physical therapist, or athletic trainer. Use a heat pack or soak the injury in warm water.  SEEK IMMEDIATE MEDICAL CARE IF: Treatment seems to offer no benefit, or the condition worsens. Any medications produce adverse side effects.  EXERCISES- RANGE OF MOTION (ROM)  AND STRETCHING EXERCISES -  Plantar Fasciitis (Heel Spur Syndrome) These exercises may help you when beginning to rehabilitate your injury. Your symptoms may resolve with or without further involvement from your physician, physical therapist or athletic trainer. While completing these exercises, remember:  Restoring tissue flexibility helps normal motion to return to the joints. This allows healthier, less painful movement and activity. An effective stretch should be held for at least 30 seconds. A stretch should never be painful. You should only feel a gentle lengthening or release in the stretched tissue.  RANGE OF MOTION - Toe Extension, Flexion Sit with your right / left leg crossed over your opposite knee. Grasp your toes and gently pull them back toward the top of your foot. You should feel a stretch on the bottom of your toes and/or foot. Hold this stretch for 10 seconds. Now, gently pull your toes toward the bottom of your foot. You should feel a stretch on the top of your toes and or foot. Hold this stretch for 10 seconds. Repeat  times. Complete this stretch 3 times per day.   RANGE OF MOTION - Ankle Dorsiflexion, Active Assisted Remove shoes and sit on a chair that is preferably not on a carpeted surface. Place right / left foot under knee. Extend your opposite leg for support. Keeping your heel down, slide your right / left foot back toward the chair until you feel a stretch at your ankle or calf. If you do not feel a stretch, slide your bottom forward to the edge of the chair, while still keeping your heel down. Hold this stretch for 10 seconds. Repeat 3 times. Complete this stretch 2 times per day.   STRETCH  Gastroc, Standing Place hands on wall. Extend right / left leg, keeping the front knee somewhat bent. Slightly point your toes inward on your back foot. Keeping your right / left heel on the floor and your knee straight, shift your weight toward the wall, not allowing your back to arch. You should  feel a gentle stretch in the right / left calf. Hold this position for 10 seconds. Repeat 3 times. Complete this stretch 2 times per day.  STRETCH  Soleus, Standing Place hands on wall. Extend right / left leg, keeping the other knee somewhat bent. Slightly point your toes inward on your back foot. Keep your right / left heel on the floor, bend your back knee, and slightly shift your weight over the back leg so that you feel a gentle stretch deep in your back calf. Hold this position for 10 seconds. Repeat 3 times. Complete this stretch 2 times per day.  STRETCH  Gastrocsoleus, Standing  Note: This exercise can place a lot of stress on your foot and ankle. Please complete this exercise only if specifically instructed by your caregiver.  Place the ball of your right / left foot on a step, keeping your other foot firmly on the same step. Hold on to the wall or a rail for balance. Slowly lift your other foot, allowing your body weight to press your heel down over the edge of the step. You should feel a stretch in your right / left calf. Hold this position for 10 seconds. Repeat this exercise with a slight bend in your right / left knee. Repeat 3 times. Complete this stretch 2 times per day.   STRENGTHENING EXERCISES - Plantar Fasciitis (Heel Spur Syndrome)  These exercises may help you when beginning to rehabilitate your injury. They may resolve your  symptoms with or without further involvement from your physician, physical therapist or athletic trainer. While completing these exercises, remember:  Muscles can gain both the endurance and the strength needed for everyday activities through controlled exercises. Complete these exercises as instructed by your physician, physical therapist or athletic trainer. Progress the resistance and repetitions only as guided.  STRENGTH - Towel Curls Sit in a chair positioned on a non-carpeted surface. Place your foot on a towel, keeping your heel on the  floor. Pull the towel toward your heel by only curling your toes. Keep your heel on the floor. Repeat 3 times. Complete this exercise 2 times per day.  STRENGTH - Ankle Inversion Secure one end of a rubber exercise band/tubing to a fixed object (table, pole). Loop the other end around your foot just before your toes. Place your fists between your knees. This will focus your strengthening at your ankle. Slowly, pull your big toe up and in, making sure the band/tubing is positioned to resist the entire motion. Hold this position for 10 seconds. Have your muscles resist the band/tubing as it slowly pulls your foot back to the starting position. Repeat 3 times. Complete this exercises 2 times per day.  Document Released: 03/26/2005 Document Revised: 06/18/2011 Document Reviewed: 07/08/2008 [facility] Patient Information 2014 [place], [place].

## 2022-04-12 NOTE — Progress Notes (Signed)
Subjective:   Patient ID: Bailey Hooper, female   DOB: 57 y.o.   MRN: [id number]   HPI Chief Complaint  Patient presents with   Callouses    Left foot callus and corns on bilateral 5th toes, nail fungus     57 y.o. female with the above concerns.  She gets callus on the left foot submetatarsal 2.  She describes sharp pain to the area with walking.  Is been ongoing for many years, about 24.  This started developing more after she had a bunion surgery on the left foot.  No recent treatment.  She gets ingrown toenails to all the nails that hurt.   She gets a callus on the medial left hallux.   She is prediabeitc and nervous about feet.  No ulcerations or any openings that she has noticed   Review of Systems  All other systems reviewed and are negative.  Past Medical History:  Diagnosis Date   Allergy    Anxiety    Arthritis    Asthma    Depression    Fibroids    GERD (gastroesophageal reflux disease)    Hiatal hernia    High cholesterol    Hypertension    IBS (irritable bowel syndrome)    Migraines    Non-alcoholic fatty liver disease    Prediabetes    Sleep apnea    cpap   Tobacco use    Vertigo     Past Surgical History:  Procedure Laterality Date   CESAREAN SECTION     COLONOSCOPY     FOOT SURGERY     Left foot     Current Outpatient Medications:    ciclopirox (PENLAC) 8 % solution, Apply topically at bedtime. Apply over nail and surrounding skin. Apply daily over previous coat. After seven (7) days, may remove with alcohol and continue cycle., Disp: 6.6 mL, Rfl: 0   albuterol (VENTOLIN HFA) 108 (90 Base) MCG/ACT inhaler, Inhale 2 puffs into the lungs every 6 (six) hours as needed for wheezing., Disp: 18 g, Rfl: 2   AMBULATORY NON FORMULARY MEDICATION, GI Cock tail: 90 ml viscous lidocaine, 90 ml 10/mg/ 5 ml dicyclomine, 270 ml maalox (Patient not taking: Reported on 08/25/2021), Disp: 450 mL, Rfl: 0   amLODipine-olmesartan (AZOR) 10-40 MG tablet, Take 1 tablet by  mouth daily., Disp: 90 tablet, Rfl: 3   Ashwagandha (ASHWAGANDHA 35) 120 MG CAPS, Take by mouth., Disp: , Rfl:    atorvastatin (LIPITOR) 10 MG tablet, Take 1 tablet (10 mg total) by mouth daily., Disp: 90 tablet, Rfl: 3   azelastine (OPTIVAR) 0.05 % ophthalmic solution, Place 1 drop into both eyes 2 (two) times daily., Disp: 6 mL, Rfl: 12   clonazePAM (KLONOPIN) 0.5 MG tablet, Take 1 tablet (0.5 mg total) by mouth daily as needed., Disp: 30 tablet, Rfl: 0   diclofenac (VOLTAREN) 75 MG EC tablet, TAKE (1) TABLET TWICE DAILY., Disp: 28 tablet, Rfl: 0   diclofenac Sodium (VOLTAREN) 1 % GEL, APPLY 4 GRAMS TOPICALLY 4 TIMES A DAY., Disp: 200 g, Rfl: 0   dicyclomine (BENTYL) 20 MG tablet, 1/2 to 1 tab 3 times daily before meals and at bedtime, Disp: 90 tablet, Rfl: 1   fluocinonide (LIDEX) 0.05 % external solution, Apply 1 application topically 2 (two) times daily., Disp: 60 mL, Rfl: 2   gabapentin (NEURONTIN) 300 MG capsule, Take 300 mg by mouth 3 (three) times daily., Disp: , Rfl:    GI Cocktail (alum & mag hydroxide-simethicone/lidocaine)oral mixture,  Take 30 mLs by mouth 2 (two) times daily as needed., Disp: 180 mL, Rfl: 0   lansoprazole (PREVACID) 30 MG capsule, Take 1 capsule (30 mg total) by mouth daily at 12 noon., Disp: 90 capsule, Rfl: 3   loratadine (CLARITIN) 10 MG tablet, Take 1 tablet (10 mg total) by mouth daily., Disp: 30 tablet, Rfl: 11   Maca Root 500 MG CAPS, Take by mouth., Disp: , Rfl:    metoprolol succinate (TOPROL-XL) 50 MG 24 hr tablet, Take 1 tablet (50 mg total) by mouth at bedtime. Take with or immediately following a meal., Disp: 90 tablet, Rfl: 1   mirtazapine (REMERON) 45 MG tablet, Take 1 tablet (45 mg total) by mouth at bedtime., Disp: 30 tablet, Rfl: 1   mometasone (NASONEX) 50 MCG/ACT nasal spray, 2 sprays each nostril daily, Disp: 17 g, Rfl: 12   Multiple Minerals-Vitamins (CAL MAG ZINC +D3) TABS, Take 1 tablet by mouth daily., Disp: , Rfl:    Multiple Vitamin  (MULTIVITAMIN) capsule, Take 1 capsule by mouth daily., Disp: , Rfl:    ondansetron (ZOFRAN-ODT) 4 MG disintegrating tablet, DISSOLVE 1 TABLET UNDER THE TONGUE EVERY 6 HOURS AS NEEDED FOR NAUSEA AND VOMITING., Disp: 30 tablet, Rfl: 0   QUEtiapine (SEROQUEL) 25 MG tablet, Take 1 tablet (25 mg total) by mouth at bedtime., Disp: 60 tablet, Rfl: 0   SUMAtriptan (IMITREX) 50 MG tablet, May repeat in 2 hours if headache persists or recurs with max of 100 mg in 24 hours., Disp: 9 tablet, Rfl: 11   traZODone (DESYREL) 50 MG tablet, Take 0.5-1 tablets (25-50 mg total) by mouth at bedtime as needed for sleep., Disp: 30 tablet, Rfl: 1   triamcinolone ointment (KENALOG) 0.5 %, Apply 1 Application topically 2 (two) times daily., Disp: 30 g, Rfl: 0   vitamin C (ASCORBIC ACID) 250 MG tablet, Take 500 mg by mouth daily., Disp: , Rfl:    Vitamin D, Ergocalciferol, (DRISDOL) 1.25 MG (50000 UNIT) CAPS capsule, Take 50,000 Units by mouth every 7 (seven) days., Disp: , Rfl:   Allergies  Allergen Reactions   Lisinopril Palpitations and Cough    Per pt report   Lamictal [Lamotrigine] Hives   Penicillins Rash    Vesicles, denies SOB or breathing issues   Erythromycin Nausea And Vomiting          Objective:  Physical Exam  General: AAO x3, NAD  Dermatological: Hyperkeratotic tissue present left foot submetatarsal 2.  Also along the medial hallux.  There is no ulceration noted today.  Incurvation present of multiple toenails.  Nails are hypertrophic, dystrophic with yellow, brown discoloration.  No hyperpigmentation.  No signs of infection.  Vascular: Dorsalis Pedis artery and Posterior Tibial artery pedal pulses are 2/4 bilateral with immedate capillary fill time.  There is no pain with calf compression, swelling, warmth, erythema.   Neruologic: Grossly intact via light touch bilateral.   Musculoskeletal: Tenderness to hyperkeratotic lesions.  Decreasing arch upon weightbearing.  Flexor, extensor tendons  appear to be intact.  Mild bunion right foot MMT 5/5.  Gait: Unassisted, Nonantalgic.       Assessment:   57 year old female with onychomycosis, ingrown toenail with hyperkeratotic lesions     Plan:  -Treatment options discussed including all alternatives, risks, and complications -Etiology of symptoms were discussed -X-rays were obtained and reviewed with the patient.  3 views bilateral feet were obtained.  Mild bunion present of the right foot.  Hardware intact to the first metatarsal left foot. -Patient debrided  hyperkeratotic lesion left foot any complications or bleeding.  For both the callus there is I discussed trying to limit the pressure.  Discussed different inserts to help take pressure off this and also consider custom inserts.  Discussed moisturizer to the calluses daily. -For the ingrown toenails I discussed partial nail avulsions versus conservative care.  This time we continue with conservative care but should they become problematic they can proceed with lesions.  With that we will treat the fungus as well as see if this will be beneficial.  Discussed different options management to do topical.  Prescribed Penlac.  Trula Slade DPM

## 2022-04-13 ENCOUNTER — Encounter: Payer: Self-pay | Admitting: Neurology

## 2022-04-13 ENCOUNTER — Other Ambulatory Visit: Payer: Self-pay

## 2022-04-13 DIAGNOSIS — R202 Paresthesia of skin: Secondary | ICD-10-CM

## 2022-04-16 ENCOUNTER — Telehealth: Payer: Self-pay | Admitting: Pharmacist

## 2022-04-16 NOTE — Telephone Encounter (Signed)
Attempted to contact patient.  No answer.    Left message on Voice Mail to reschedule by calling main number for a Video visit.   I am happy to do video visit as patient lives > 30 minutes from clinic.

## 2022-04-16 NOTE — Telephone Encounter (Signed)
Schedules by Claiborne Billings for 04/23/22. Christen Bame, CMA

## 2022-04-19 ENCOUNTER — Ambulatory Visit: Payer: Medicaid Other | Admitting: Student

## 2022-04-19 VITALS — BP 201/111 | HR 66 | Ht 66.0 in | Wt 175.4 lb

## 2022-04-19 DIAGNOSIS — L219 Seborrheic dermatitis, unspecified: Secondary | ICD-10-CM

## 2022-04-19 DIAGNOSIS — L659 Nonscarring hair loss, unspecified: Secondary | ICD-10-CM | POA: Diagnosis not present

## 2022-04-19 MED ORDER — CICLOPIROX 1 % EX SHAM: MEDICATED_SHAMPOO | CUTANEOUS | 0 refills | Status: DC

## 2022-04-19 NOTE — Progress Notes (Addendum)
    SUBJECTIVE:   CHIEF COMPLAINT / HPI: hair loss  Bailey Hooper is a 57 year old female here for hair loss and seborrheic dermatitis of the scalp. This has been ongoing for many years.  She has tried ketoconazole shampoo, steroids, fluconazole,  fluocinonide fluocinonide without relief. She is very worried that she is going to lose all of her hair.  She wears her braids slightly loose so there is not too much tension on her natural hair. She says that her scalp is itchy.  She felt a burning sensation when she uses ketoconazole shampoo. She has been using head and shoulder shampoo and scrapes off the scaly lesions from her seborrheic dermatitis then applies moisturizing cream to her scalp.  She also bought an over-the-counter hair growth treatment kit from [facility] that did not help.   Of note, her blood pressure was elevated to 201/111 on arrival.  She has not taken any of her blood pressure medicines today.  She also had some confusion as to which medicine she was supposed to be taking.   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  PERTINENT  PMH / PSH: Tobacco use, hypertension  OBJECTIVE:   BP (!) 201/111   Pulse 66   Ht '5\' 6"'$  (1.676 m)   Wt 175 lb 6.4 oz (79.6 kg)   LMP 02/12/2017   SpO2 100%   BMI 28.31 kg/m   General: Pleasant, well-appearing CV: Regular rate and rhythm Respiratory: Normal work of breathing on room air. Scalp: No obvious erythema, scaling, crusting.  Skin overall appears normal and moisturized.  Mild amount of hair loss in the frontal edge of the scalp.  ASSESSMENT/PLAN:   Seborrheic dermatitis of scalp Assessment & Plan: Has trialed many medications in the past without much relief. Prescribed ciclopirox 1% shampoo to be used twice weekly for 4 weeks. Encouraged her to continue with her moisturizing cream on the scalp as  well as reddening of scaly lesions as they appear.    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 Discussed expectations that she may or may not continue to lose more hair, but the pattern of her hair loss fits most with this picture.   Hypertension  Assessment & Plan Well-appearing, in no distress. Likely significantly elevated due to skipping medications today and life stressors.  Emphasized importance of taking antihypertensives.  No concern for hypertensive emergency as she is asymptomatic.  F/u in 1 week for BP check.   Orvis Brill, [place]

## 2022-04-19 NOTE — Assessment & Plan Note (Signed)
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 Discussed expectations that she may or may not continue to lose more hair, but the pattern of her hair loss fits most with this picture.

## 2022-04-19 NOTE — Assessment & Plan Note (Signed)
Has trialed many medications in the past without much relief. Prescribed ciclopirox 1% shampoo to be used twice weekly for 4 weeks. Encouraged her to continue with her moisturizing cream on the scalp as well as reddening of scaly lesions as they appear.

## 2022-04-19 NOTE — Patient Instructions (Signed)
It was great seeing you today.  We prescribed you a shampoo to use twice weekly for 4 weeks. Please come back and see [place] if you do not improve.  We collected testing today- I will call you if it is abnormal. If your results are normal, I will send you a MyChart message.   If you have any questions or concerns, please feel free to call the clinic.   Have a wonderful day,  Dr. Orvis Brill [facility] [phone]

## 2022-04-22 DIAGNOSIS — H5213 Myopia, bilateral: Secondary | ICD-10-CM | POA: Diagnosis not present

## 2022-04-23 ENCOUNTER — Telehealth (INDEPENDENT_AMBULATORY_CARE_PROVIDER_SITE_OTHER): Payer: Medicaid Other | Admitting: Pharmacist

## 2022-04-23 DIAGNOSIS — Z720 Tobacco use: Secondary | ICD-10-CM

## 2022-04-23 NOTE — Patient Instructions (Signed)
Did not join virtual visit connection.

## 2022-04-23 NOTE — Assessment & Plan Note (Signed)
Unable to connect for planned virtual visit.   Left voice mail to reschedule or call back directly.

## 2022-04-23 NOTE — Progress Notes (Signed)
Attempted to contact patient for follow-up of tobacco intake reduction / cessation. We did not connect through virtual visit scheduled at 9:00 AM today.  As the previous appoint was complicated, I was unable to join call until ~ 9:15.  At 9:15, I attempted to call patient.  She did not answer.  I left Voice Mail requesting her to join the virtual visit or possibly call directly (direct phone number provided).   At 11:15, I attempted to call patient again.  Again, no answer and I left message requesting either reschedule virtual or calling directly to my.  Left HIPAA compliant voice mail requesting call back to direct phone: [phone]  Total time with patient call and documentation of interaction: 13 minutes.  No CHARGE for visit today.   Additional follow-up phone call planned: 1-2 weeks

## 2022-04-26 ENCOUNTER — Telehealth (INDEPENDENT_AMBULATORY_CARE_PROVIDER_SITE_OTHER): Payer: Medicaid Other | Admitting: Pharmacist

## 2022-04-26 ENCOUNTER — Encounter: Payer: Self-pay | Admitting: Pharmacist

## 2022-04-26 DIAGNOSIS — Z720 Tobacco use: Secondary | ICD-10-CM | POA: Diagnosis not present

## 2022-04-26 MED ORDER — VARENICLINE TARTRATE 0.5 MG PO TABS: ORAL_TABLET | ORAL | 5 refills | Status: DC

## 2022-04-26 NOTE — Patient Instructions (Signed)
Start Varenicline 0.'5mg'$  once daily for 1 week THEN increase to twice daily.   Cut down until you are able to quit completely.   We will plan to call you again in two weeks.

## 2022-04-26 NOTE — Assessment & Plan Note (Signed)
Tobacco use disorder with mild nicotine dependence of 30+ years duration in a patient who is excellent candidate for success because of motivation to quit and willingness to use pharmacotherapy.  Set quit date for 30 days from today (around 05/27/2022). -Initiated varenicline 0.5 mg by mouth once daily with food x7 days, then 0.5 mg by mouth twice daily with food until follow-up appointment. Patient counseled on purpose, proper use, and potential adverse effects, including GI upset.

## 2022-04-26 NOTE — Progress Notes (Signed)
   S:   Chief Complaint  Patient presents with   Medication Management    Tobacco cessation   Bailey Hooper is a 57 y.o. female who presents for evaluation/assistance with tobacco dependence.  PMH is significant for tobacco abuse, hypertension, asthma, heart murmur, bipolar disorder/MDD, and migraine.  Patient was referred and last seen by Primary Care Provider, Dr. Owens Shark, on 04/19/2022.   Visit conducted by phone - patient unable to connect virtually.   Patient was at work.   Conducted consult from [place].   Age when started using tobacco on a daily basis: 30 years ago (in her 86's). Brand smoked: [place]. Number of cigarettes/day: 6.  Estimated nicotine content per cigarette (mg) 1.1 Estimated nicotine intake per day >6 mg.   Denies waking to smoke at night.  Fagerstrom Score Question Scoring Patient Score  How soon after waking do you smoke your first cigarette? <5 mins (3) 5-30 mins (2) 31-60 mins (1) >60 mins (0) 1  Do you find it difficult NOT to smoke in places where you shouldn't? Yes(1) No (0) 0  Which cigarette would you most hate to give up? First one in AM (1) Any other one (0) 0   How many cigarettes do you smoke/day? 10 or less (0) 11-20 (1) 21-30 (2) >30 (3) 0  Do you smoke more during the first few hours after waking? Yes (1) No (0) 0  Do you smoke if you are so ill you cannot get out of bed? Yes (1) No (0) 0    Total Score  1   Score interpretation: low 1-2  Most recent quit attempt: sept. 2023 Longest time ever been tobacco free: 1 year.  Medications used in past cessation efforts include: Rx nicotine inhaler (Nicotrol oral inhaler)  Rates IMPORTANCE of quitting tobacco on 1-10 scale of 10. Rates CONFIDENCE of quitting tobacco on 1-10 scale of 10.  Most common triggers to use tobacco include; food, nervousness, boredom  Motivation to quit: Mom and sister experiencing lung problems; both have recently been admitted to the  hospital for PE  O: Clinical ASCVD: No   A/P: Tobacco use disorder with mild nicotine dependence of 30+ years duration in a patient who is excellent candidate for success because of motivation to quit and willingness to use pharmacotherapy.  Set quit date for 30 days from today (around 05/27/2022). -Initiated varenicline 0.5 mg by mouth once daily with food x7 days, then 0.5 mg by mouth twice daily with food until follow-up appointment. Patient counseled on purpose, proper use, and potential adverse effects, including GI upset.   Written patient instructions provided. Patient verbalized understanding of treatment plan.  Total time in over the phone counseling 45 minutes.    Follow-up:  Pharmacist 2 weeks via phone. Patient seen with Dixon Boos,  PharmD Candidate and Joseph Art, PharmD, PGY2 Pharmacy Resident.

## 2022-04-27 ENCOUNTER — Telehealth: Payer: Self-pay

## 2022-04-27 NOTE — Telephone Encounter (Signed)
A Prior Authorization was initiated for this patients CICLOPIROX 1% SHAMPOO through CoverMyMeds.   Key: [place] Nurse

## 2022-04-27 NOTE — Progress Notes (Signed)
Reviewed: I agree with Dr. Graylin Shiver documentation and management.

## 2022-04-30 ENCOUNTER — Other Ambulatory Visit (HOSPITAL_COMMUNITY): Payer: Self-pay

## 2022-04-30 NOTE — Telephone Encounter (Signed)
Prior Auth for patients medication CICLOPIROX 1% SHAMPOO denied by [employer] via CoverMyMeds.   Reason: unable to process. Must bill to patients primary insurance  CoverMyMeds Key: [facility]

## 2022-04-30 NOTE — Telephone Encounter (Signed)
Spoke to patient regarding insurance issue.   Patient reaching out to medicaid to let them know she doesn't have any commercial coverage.

## 2022-05-02 ENCOUNTER — Ambulatory Visit: Payer: Medicaid Other

## 2022-05-04 ENCOUNTER — Ambulatory Visit (HOSPITAL_COMMUNITY): Payer: Medicaid Other | Admitting: Clinical

## 2022-05-04 ENCOUNTER — Ambulatory Visit
Admission: RE | Admit: 2022-05-04 | Discharge: 2022-05-04 | Disposition: A | Discharge status: Home / Self Care | Payer: Medicaid Other | Source: Ambulatory Visit | Attending: Family Medicine | Admitting: Family Medicine

## 2022-05-04 DIAGNOSIS — Z1231 Encounter for screening mammogram for malignant neoplasm of breast: Secondary | ICD-10-CM

## 2022-05-08 ENCOUNTER — Other Ambulatory Visit: Payer: Self-pay | Admitting: Family Medicine

## 2022-05-08 DIAGNOSIS — R928 Other abnormal and inconclusive findings on diagnostic imaging of breast: Secondary | ICD-10-CM

## 2022-05-09 ENCOUNTER — Telehealth (INDEPENDENT_AMBULATORY_CARE_PROVIDER_SITE_OTHER): Payer: Medicaid Other | Admitting: Pharmacist

## 2022-05-09 DIAGNOSIS — F17210 Nicotine dependence, cigarettes, uncomplicated: Secondary | ICD-10-CM

## 2022-05-09 DIAGNOSIS — Z720 Tobacco use: Secondary | ICD-10-CM

## 2022-05-09 NOTE — Telephone Encounter (Signed)
Patient contacted for follow/up of tobacco intake reduction / cessation attempt.   Since last contact patient reports reduced intake to 2 cigs per day with no pattern of use - Random.  Medications currently being used;  Varenicline - 0.'5mg'$  BID Patient denies any significant side effects from tobacco cessation therapy.   Continues to rates IMPORTANCE of quitting tobacco as high.  Continues to rate CONFIDENCE of quitting tobacco as high.   Most common triggers to use tobacco include; None identified.   Motivation to quit: health/breathing  Patient is participating in a Managed Medicaid Plan:  Yes  Total time with patient call and documentation of interaction: 8 minutes. Follow-up phone call planned: 3-4 weeks

## 2022-05-09 NOTE — Assessment & Plan Note (Signed)
Patient contacted for follow/up of tobacco intake reduction / cessation attempt.   Since last contact patient reports reduced intake to 2 cigs per day with no pattern of use - Random.  Medications currently being used;  Varenicline - 0.'5mg'$  BID Patient denies any significant side effects from tobacco cessation therapy.   Continues to rates IMPORTANCE of quitting tobacco as high.  Continues to rate CONFIDENCE of quitting tobacco as high.

## 2022-05-09 NOTE — Telephone Encounter (Signed)
-----  Message from Leavy Cella, [place] sent at 04/23/2022 12:37 PM EST ----- Regarding: Tobacco F/U

## 2022-05-09 NOTE — Telephone Encounter (Signed)
Noted and agree. 

## 2022-05-11 DIAGNOSIS — H00022 Hordeolum internum right lower eyelid: Secondary | ICD-10-CM | POA: Diagnosis not present

## 2022-05-14 ENCOUNTER — Telehealth: Payer: Self-pay | Admitting: *Deleted

## 2022-05-14 NOTE — Telephone Encounter (Signed)
Pt called in wanting to know if we got her PA for the shampoo she was just prescribed in our derm clinic. Please advise. Shamarie Call Kennon Holter, CMA

## 2022-05-15 ENCOUNTER — Other Ambulatory Visit (HOSPITAL_COMMUNITY): Payer: Self-pay

## 2022-05-17 ENCOUNTER — Other Ambulatory Visit (HOSPITAL_COMMUNITY): Payer: Self-pay

## 2022-05-17 ENCOUNTER — Ambulatory Visit: Payer: Medicaid Other | Admitting: Neurology

## 2022-05-17 DIAGNOSIS — G5601 Carpal tunnel syndrome, right upper limb: Secondary | ICD-10-CM

## 2022-05-17 DIAGNOSIS — R202 Paresthesia of skin: Secondary | ICD-10-CM

## 2022-05-17 NOTE — Procedures (Signed)
  [facility]  [place], [place]  [place], [place] [address] Tel: [phone] Fax: [phone] Test Date:  05/17/2022  Patient: Bailey Hooper DOB: [birth year] Physician: Narda Amber, DO  Sex: Female Height: '5\' 6"'$  Ref Phys: Dorris Singh, MD  ID#: [id number]   Technician:    History: This is a 57 year old female referred for evaluation of right hand numbness and tingling.  NCV & EMG Findings: Extensive electrodiagnostic testing of the right upper extremity shows:  Right mixed palmar sensory responses show prolonged latency.  Right median and ulnar sensory responses are within normal limits. Right median and ulnar motor responses are within normal limits. There is no evidence of active or chronic motor axonal loss changes affecting any of the tested muscles.  Motor unit configuration and recruitment pattern is within normal limits.  Impression: Right median neuropathy at or distal to the wrist, consistent with a clinical diagnosis of carpal tunnel syndrome.  Overall, these findings are very mild in degree electrically.   ___________________________ Narda Amber, DO    Nerve Conduction Studies   Stim Site NR Peak (ms) Norm Peak (ms) O-P Amp (V) Norm O-P Amp  Right Median Anti Sensory (2nd Digit)  33 C  Wrist    3.0 <3.6 37.8 >15  Right Ulnar Anti Sensory (5th Digit)  33 C  Wrist    2.3 <3.1 37.3 >10     Stim Site NR Onset (ms) Norm Onset (ms) O-P Amp (mV) Norm O-P Amp Site1 Site2 Delta-0 (ms) Dist (cm) Vel (m/s) Norm Vel (m/s)  Right Median Motor (Abd Poll Brev)  33 C  Wrist    2.8 <4.0 8.9 >6 Elbow Wrist 5.1 29.0 57 >50  Elbow    7.9  8.4         Right Ulnar Motor (Abd Dig Minimi)  33 C  Wrist    2.3 <3.1 11.2 >7 B Elbow Wrist 3.2 20.0 63 >50  B Elbow    5.5  11.0  A Elbow B Elbow 1.5 10.0 67 >50  A Elbow    7.0  10.6            Stim Site NR Peak (ms) Norm Peak (ms) P-T Amp (V) Site1 Site2 Delta-P (ms) Norm Delta (ms)  Right Median/Ulnar Palm  Comparison (Wrist - 8cm)  33 C  Median Palm    1.8 <2.2 66.4 Median Palm Ulnar Palm *0.5   Ulnar Palm    1.3 <2.2 10.9       Electromyography   Side Muscle Ins.Act Fibs Fasc Recrt Amp Dur Poly Activation Comment  Right 1stDorInt Nml Nml Nml Nml Nml Nml Nml Nml N/A  Right Abd Poll Brev Nml Nml Nml Nml Nml Nml Nml Nml N/A  Right PronatorTeres Nml Nml Nml Nml Nml Nml Nml Nml N/A  Right Biceps Nml Nml Nml Nml Nml Nml Nml Nml N/A  Right Triceps Nml Nml Nml Nml Nml Nml Nml Nml N/A  Right Deltoid Nml Nml Nml Nml Nml Nml Nml Nml N/A      Waveforms:

## 2022-05-17 NOTE — Telephone Encounter (Signed)
Left message informing patient that her managed medicaid is still showing she has primary coverage and she should reach back out to both insurances to discuss.

## 2022-05-18 ENCOUNTER — Ambulatory Visit
Admission: RE | Admit: 2022-05-18 | Discharge: 2022-05-18 | Disposition: A | Discharge status: Home / Self Care | Payer: Medicaid Other | Source: Ambulatory Visit | Attending: Family Medicine | Admitting: Family Medicine

## 2022-05-18 ENCOUNTER — Ambulatory Visit: Payer: Medicaid Other

## 2022-05-18 DIAGNOSIS — R928 Other abnormal and inconclusive findings on diagnostic imaging of breast: Secondary | ICD-10-CM | POA: Diagnosis not present

## 2022-05-25 ENCOUNTER — Other Ambulatory Visit (HOSPITAL_COMMUNITY): Payer: Self-pay

## 2022-05-29 ENCOUNTER — Telehealth: Payer: Self-pay | Admitting: Podiatry

## 2022-05-29 NOTE — Telephone Encounter (Signed)
Pt called stating at the last appt you discussed some inserts with her and when she got the AVS they were not listed. Could you give her the names of the inserts you were talking about please.

## 2022-05-30 ENCOUNTER — Encounter: Payer: Self-pay | Admitting: Family Medicine

## 2022-05-31 ENCOUNTER — Other Ambulatory Visit (HOSPITAL_COMMUNITY): Payer: Self-pay

## 2022-06-01 ENCOUNTER — Telehealth: Payer: Self-pay | Admitting: Pharmacist

## 2022-06-01 DIAGNOSIS — Z720 Tobacco use: Secondary | ICD-10-CM

## 2022-06-01 NOTE — Assessment & Plan Note (Signed)
Patient contacted for follow/up of tobacco intake reduction / cessation attempt.   Since last contact patient reports large reduction in smoking.  Reports only smoking ~3 cigarettes in the last week.   Medications currently being used;  Varenicline - 0.'5mg'$  BID Patient denies any significant side effects from tobacco cessation therapy.    Continues to rates IMPORTANCE of quitting tobacco as high.   Continues to rate CONFIDENCE of quitting tobacco as high.   Most common triggers to use tobacco include; Stress.  We briefly discussed delay and de-stressing approaches   Motivation to quit: Health Patient is participating in a Managed Medicaid Plan:  Yes  Short-term Goal is to quit completely for 7 days - no smoking.  Continue varenicline current dose until quit for > 1 month.

## 2022-06-01 NOTE — Telephone Encounter (Signed)
Patient contacted for follow/up of tobacco intake reduction / cessation attempt.   Since last contact patient reports large reduction in smoking.  Reports only smoking ~3 cigarettes in the last week.   Medications currently being used;  Varenicline - 0.'5mg'$  BID Patient denies any significant side effects from tobacco cessation therapy.    Continues to rates IMPORTANCE of quitting tobacco as high.   Continues to rate CONFIDENCE of quitting tobacco as high.   Most common triggers to use tobacco include; Stress.  We briefly discussed delay and de-stressing approaches   Motivation to quit: Health Patient is participating in a Managed Medicaid Plan:  Yes  Short-term Goal is to quit completely for 7 days - no smoking.  Continue varenicline current dose until quit for > 1 month.   Total time with patient call and documentation of interaction: 13 minutes. Follow-up phone call planned: 3 weeks.

## 2022-06-01 NOTE — Telephone Encounter (Signed)
Reviewed and agree with Dr Graylin Shiver plan.

## 2022-06-01 NOTE — Telephone Encounter (Signed)
-----   Message from Leavy Cella, [place] sent at 05/09/2022  9:18 AM EST ----- Regarding: tobacco F/U

## 2022-06-07 ENCOUNTER — Other Ambulatory Visit (HOSPITAL_COMMUNITY): Payer: Self-pay

## 2022-06-07 DIAGNOSIS — F410 Panic disorder [episodic paroxysmal anxiety] without agoraphobia: Secondary | ICD-10-CM | POA: Diagnosis not present

## 2022-06-07 DIAGNOSIS — F331 Major depressive disorder, recurrent, moderate: Secondary | ICD-10-CM | POA: Diagnosis not present

## 2022-06-07 DIAGNOSIS — F4320 Adjustment disorder, unspecified: Secondary | ICD-10-CM | POA: Diagnosis not present

## 2022-06-08 ENCOUNTER — Ambulatory Visit (HOSPITAL_COMMUNITY): Payer: Medicaid Other | Admitting: Clinical

## 2022-06-13 ENCOUNTER — Ambulatory Visit (INDEPENDENT_AMBULATORY_CARE_PROVIDER_SITE_OTHER): Payer: Medicaid Other | Admitting: Clinical

## 2022-06-13 DIAGNOSIS — F411 Generalized anxiety disorder: Secondary | ICD-10-CM | POA: Diagnosis not present

## 2022-06-13 DIAGNOSIS — F3132 Bipolar disorder, current episode depressed, moderate: Secondary | ICD-10-CM | POA: Diagnosis not present

## 2022-06-13 NOTE — Progress Notes (Signed)
Virtual Visit via Video Note  I connected with Bailey Hooper on 06/13/22 at  4:00 PM EST by a video enabled telemedicine application and verified that I am speaking with the correct person using two identifiers.  Location: Patient: Home Provider: Office   I discussed the limitations of evaluation and management by telemedicine and the availability of in person appointments. The patient expressed understanding and agreed to proceed.  THERAPIST PROGRESS NOTE   Session Time: 4:00 PM-4:45 PM   Participation Level: Active   Behavioral Response: CasualAlertAnxious   Type of Therapy: Individual Therapy   Treatment Goals addressed: Coping   Interventions: CBT   Summary: Bailey Hooper is a 57 y.o. female who presents with Bipolar Disorder./ GAD. The OPT therapist worked with the patient for her scheduled OPT session. The OPT therapist utilized Motivational Interviewing to assist in creating therapeutic repore. The patient in the session was engaged and work in collaboration giving feedback about her triggers and symptoms over the past few weeks. The patient spoke about living situation and is currently looking for a place to transition to in [facility] with concern that the home she is currently living in may soon be sold and she will immediately have to transition. The OPT therapist utilized Cognitive Behavioral Therapy through cognitive restructuring as well as worked with the patient on coping strategies to assist in management of mood and as she continues to work on her interactions with family. The patient spoke about frustration in living with her daughter currently and spoke about hoping to be able to move out and live on her own due to having frequent conflict with her daughter who she lives with and notes her daughter also struggles with her own MH.    Suicidal/Homicidal: Nowithout intent/plan   Therapist Response: The OPT therapist worked with the patient for the patients scheduled  session. The patient was engaged in her session and gave feedback in relation to triggers, symptoms, and behavior responses over the past few weeks. The OPT therapist worked with the patient utilizing an in session Cognitive Behavioral Therapy exercise. The patient was responsive in the session and verbalized, " I am just ready to move out and live on my own I just can't keep cleaning up after her and taking care of her and she does not make good decisions".   The OPT therapist worked with the patient on managing her own individual health and being mindful of her own basic care needs including eating, sleeping, exercise, and hygenie. The patient identified her living situation and inner family conflict with her daughter are her largest stressors. The OPT therapist over-viewed potential changes including moving out on her own.The patient is considering moving out to a income based housing.The patient is currently working a part time job but works limited hours and the patient notes she has a disability hearing in May and she is hoping to get approved for her Disability for her [place].The patient noted if she is not able to get the approval she will have to try to work more, however, feels she will not be successful in keeping a job due to her [place] symptoms. The OPT therapist overviewed the patients current Med Management for symptom management and the patient is currently on a month trial base to see how she responds. The patient spoke about trying to keep her distance right now with her daughter and just working to not get into conflict with her daughter. The patient spoke about trying to cope by  getting out of the house more, going to [employer], and/or taking a ride.The patient spoke about ongoing health of her Mother and caregiving for her taking her to appointments as needed. The OPT therapist will continue treatment work with the patient in her next scheduled session.   Plan: Return again in 3 weeks.    Diagnosis:      Axis I: Bipolar Disorder/ GAD                             Axis II: No diagnosis   Collaboration of Care: No additional collaboration of care for this session.    Patient/Guardian was advised Release of Information must be obtained prior to any record release in order to collaborate their care with an outside provider. Patient/Guardian was advised if they have not already done so to contact the registration department to sign all necessary forms in order for [place] to release information regarding their care.    Consent: Patient/Guardian gives verbal consent for treatment and assignment of benefits for services provided during this visit. Patient/Guardian expressed understanding and agreed to proceed      I discussed the assessment and treatment plan with the patient. The patient was provided an opportunity to ask questions and all were answered. The patient agreed with the plan and demonstrated an understanding of the instructions.   The patient was advised to call back or seek an in-person evaluation if the symptoms worsen or if the condition fails to improve as anticipated.   I provided 45 minutes of non-face-to-face time during this encounter.   Lennox Grumbles, LCSW   06/13/2022

## 2022-06-21 ENCOUNTER — Telehealth: Payer: Self-pay | Admitting: Pharmacist

## 2022-06-21 NOTE — Telephone Encounter (Signed)
Attempted to contact patient for follow-up of tobacco intake reduction / cessation.   No answer  X2 - unable to leave message  Total time with patient call and documentation of interaction: 8 minutes.  Additional follow-up phone call planned: 1-2 weeks

## 2022-07-02 ENCOUNTER — Telehealth: Payer: Self-pay | Admitting: Pharmacist

## 2022-07-02 NOTE — Telephone Encounter (Signed)
-----   Message from Leavy Cella, [place] sent at 06/21/2022  2:40 PM EDT ----- Regarding: Tobacco Cessation or intake reductino

## 2022-07-02 NOTE — Telephone Encounter (Signed)
Attempted to contact patient for follow-up of tobacco intake reduction / cessation X 2.  Unable to leave message.  I will try again in two weeks.

## 2022-07-03 NOTE — Telephone Encounter (Signed)
Reviewed and agree with Dr Koval's plan.   

## 2022-07-11 DIAGNOSIS — K219 Gastro-esophageal reflux disease without esophagitis: Secondary | ICD-10-CM | POA: Diagnosis not present

## 2022-07-11 DIAGNOSIS — R112 Nausea with vomiting, unspecified: Secondary | ICD-10-CM | POA: Diagnosis not present

## 2022-07-11 DIAGNOSIS — Z800 Family history of malignant neoplasm of digestive organs: Secondary | ICD-10-CM | POA: Diagnosis not present

## 2022-07-11 DIAGNOSIS — K760 Fatty (change of) liver, not elsewhere classified: Secondary | ICD-10-CM | POA: Diagnosis not present

## 2022-07-11 DIAGNOSIS — R140 Abdominal distension (gaseous): Secondary | ICD-10-CM | POA: Diagnosis not present

## 2022-07-11 DIAGNOSIS — K582 Mixed irritable bowel syndrome: Secondary | ICD-10-CM | POA: Diagnosis not present

## 2022-07-11 DIAGNOSIS — R1013 Epigastric pain: Secondary | ICD-10-CM | POA: Diagnosis not present

## 2022-07-11 DIAGNOSIS — K222 Esophageal obstruction: Secondary | ICD-10-CM | POA: Diagnosis not present

## 2022-07-11 DIAGNOSIS — R142 Eructation: Secondary | ICD-10-CM | POA: Diagnosis not present

## 2022-07-11 DIAGNOSIS — Z86010 Personal history of colonic polyps: Secondary | ICD-10-CM | POA: Diagnosis not present

## 2022-07-12 ENCOUNTER — Ambulatory Visit: Payer: Medicaid Other | Admitting: Podiatry

## 2022-07-24 ENCOUNTER — Telehealth: Payer: Self-pay | Admitting: Pharmacist

## 2022-07-24 NOTE — Telephone Encounter (Signed)
Attempted to contact patient for follow-up of tobacco intake reduction / cessation.   Attempted X3 over the last two weeks.  Unable to leave message.   Total time with patient call and documentation of interaction: 11 minutes.  Additional follow-up phone call planned: 1 month.

## 2022-07-24 NOTE — Telephone Encounter (Signed)
-----   Message from Kathrin Ruddy, RPH-CPP sent at 07/02/2022  4:27 PM EDT ----- Regarding: Tobacco Cessation or Intake Reduction

## 2022-08-02 DIAGNOSIS — F4320 Adjustment disorder, unspecified: Secondary | ICD-10-CM | POA: Diagnosis not present

## 2022-08-02 DIAGNOSIS — F410 Panic disorder [episodic paroxysmal anxiety] without agoraphobia: Secondary | ICD-10-CM | POA: Diagnosis not present

## 2022-08-02 DIAGNOSIS — F331 Major depressive disorder, recurrent, moderate: Secondary | ICD-10-CM | POA: Diagnosis not present

## 2022-08-06 ENCOUNTER — Ambulatory Visit (HOSPITAL_COMMUNITY): Payer: Medicaid Other | Admitting: Clinical

## 2022-08-06 ENCOUNTER — Telehealth: Payer: Self-pay

## 2022-08-06 NOTE — Telephone Encounter (Signed)
Patient is scheduled 09/26/2022 for her AEX.  She said last exam 5/19.2023 Dr. Mackey Birchwood noted a skin tag.  4. Skin tag of vulva  F/U excision of left perineal skin tag.  She is asking if Dr. Mackey Birchwood can remove this at her AEX visit? If not, she asked that Dr. Mackey Birchwood refer her to someone who can remove it. She now has Medicaid and wants to have it removed.

## 2022-08-07 NOTE — Telephone Encounter (Signed)
Per Debarah Crape extra time added to appt. Patient informed skin tag can be removed at visit as extra time has been added to her appt

## 2022-08-07 NOTE — Telephone Encounter (Signed)
Message sent to appointment desk.  ?

## 2022-08-07 NOTE — Telephone Encounter (Signed)
Genia Del, MD  You16 hours ago (4:43 PM)   I can but need more time for her visit. Dr Elbert Ewings

## 2022-08-08 ENCOUNTER — Telehealth: Payer: Self-pay

## 2022-08-08 NOTE — Telephone Encounter (Signed)
LVMTCB on mobile

## 2022-08-08 NOTE — Telephone Encounter (Signed)
Per ML:  "We will do HRT counseling and management at that visit 08/26/22.  I always prescribe bio-identical HRT with the Estradiol patch and Progesterone tab. Dr L"  "Call cannot be completed at this time. Please hang up and try your call again later."

## 2022-08-08 NOTE — Telephone Encounter (Signed)
Pt LVM in triage line stating that her vasomotor sxs of menopause are really starting to effect her daily life. Reports struggles with hot flashes and uncontrollable mood swings. Reports that she has done some research on "bioidentical HRTs" and would like to know if this is something that you would be willing to prescribe her for mgmt?   Last AEX 08/25/2021--scheduled for 09/26/2022 (Also to have skin tag removed, allotted for appt time)  Last mammo 05/04/2022-birads 0, Dx Lt 05/18/2022-neg birads 1  Hx of HTN and mental health disorders.   Please advise.

## 2022-08-15 NOTE — Telephone Encounter (Signed)
"  Call cannot be completed at this time, please hang up and try your call again later."

## 2022-08-16 NOTE — Telephone Encounter (Signed)
Pt notified and voiced understanding. States she desires to try to be seen sooner for consult for HRTs but unfortunately requires transportation assistance so may not be able to be seen sooner than current scheduled appt but will try due to vasomotor sxs really affecting her daily life. Will route to provider for final review and close encounter.

## 2022-08-28 ENCOUNTER — Telehealth: Payer: Self-pay

## 2022-08-28 ENCOUNTER — Ambulatory Visit (HOSPITAL_COMMUNITY): Payer: Medicaid Other | Admitting: Clinical

## 2022-08-28 ENCOUNTER — Telehealth (HOSPITAL_COMMUNITY): Payer: Self-pay | Admitting: Clinical

## 2022-08-28 ENCOUNTER — Encounter (HOSPITAL_COMMUNITY): Payer: Self-pay | Admitting: Clinical
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
## 2022-08-28 NOTE — Progress Notes (Signed)
Patient attempted to be outreached by Khadijah Mastrianni Messa, PharmD Candidate on 08/28/22 to discuss hypertension. Left voicemail for patient to return our call at their convenience at [phone].  Porche Steinberger, Student-PharmD 

## 2022-08-28 NOTE — Telephone Encounter (Signed)
Patient did not respond to contact attempts 

## 2022-08-29 ENCOUNTER — Encounter: Payer: Self-pay | Admitting: Family Medicine

## 2022-08-29 ENCOUNTER — Ambulatory Visit (INDEPENDENT_AMBULATORY_CARE_PROVIDER_SITE_OTHER): Payer: Medicaid Other | Admitting: Family Medicine

## 2022-08-29 VITALS — BP 138/88 | HR 78 | Ht 66.0 in | Wt 174.6 lb

## 2022-08-29 DIAGNOSIS — Z23 Encounter for immunization: Secondary | ICD-10-CM

## 2022-08-29 DIAGNOSIS — I10 Essential (primary) hypertension: Secondary | ICD-10-CM

## 2022-08-29 DIAGNOSIS — K760 Fatty (change of) liver, not elsewhere classified: Secondary | ICD-10-CM

## 2022-08-29 DIAGNOSIS — Z720 Tobacco use: Secondary | ICD-10-CM | POA: Diagnosis not present

## 2022-08-29 DIAGNOSIS — G43909 Migraine, unspecified, not intractable, without status migrainosus: Secondary | ICD-10-CM

## 2022-08-29 DIAGNOSIS — Z9889 Other specified postprocedural states: Secondary | ICD-10-CM

## 2022-08-29 DIAGNOSIS — Z0000 Encounter for general adult medical examination without abnormal findings: Secondary | ICD-10-CM

## 2022-08-29 DIAGNOSIS — F319 Bipolar disorder, unspecified: Secondary | ICD-10-CM

## 2022-08-29 MED ORDER — AMLODIPINE-OLMESARTAN 10-40 MG PO TABS: 1.0000 | ORAL_TABLET | Freq: Every day | ORAL | 3 refills | Status: DC

## 2022-08-29 MED ORDER — VARENICLINE TARTRATE 0.5 MG PO TABS: ORAL_TABLET | ORAL | 5 refills | Status: DC

## 2022-08-29 MED ORDER — TRIAMCINOLONE ACETONIDE 0.5 % EX OINT: 1.0000 | TOPICAL_OINTMENT | Freq: Two times a day (BID) | CUTANEOUS | 0 refills | Status: DC

## 2022-08-29 MED ORDER — TETANUS-DIPHTH-ACELL PERTUSSIS 5-2.5-18.5 LF-MCG/0.5 IM SUSY
0.5000 mL | PREFILLED_SYRINGE | Freq: Once | INTRAMUSCULAR | 0 refills | Status: AC
Start: 2022-08-29 — End: 2022-08-29

## 2022-08-29 MED ORDER — SUMATRIPTAN SUCCINATE 50 MG PO TABS
ORAL_TABLET | ORAL | 11 refills | Status: AC
Start: 2022-08-29 — End: ?

## 2022-08-29 MED ORDER — SHINGRIX 50 MCG/0.5ML IM SUSR
0.5000 mL | INTRAMUSCULAR | 0 refills | Status: DC
Start: 2022-08-29 — End: 2022-09-26

## 2022-08-29 MED ORDER — NYSTATIN 100000 UNIT/GM EX POWD: 1.0000 | Freq: Two times a day (BID) | CUTANEOUS | 0 refills | Status: DC

## 2022-08-29 NOTE — Assessment & Plan Note (Signed)
Hepatic panel today No alcohol use

## 2022-08-29 NOTE — Assessment & Plan Note (Signed)
Controlled Refilled Imitrex

## 2022-08-29 NOTE — Patient Instructions (Signed)
It was wonderful to see you today.  Please bring ALL of your medications with you to every visit.   Today we talked about:  - Checking your blood work, including your fatty liver test and cholesterol  - I will let you know if you need a cholesterol pill --STOP losartan and amlodipine - START Azor (combined amlodipine-olmesartan)  - I sent in refills  - I sent in nystatin powder to use on your skin folds  - Please take the Tdap and Shingles prescriptions to your pharmacy  I will message you with blood work  Please follow up in 3 months  Okay to be virtual   Thank you for choosing [facility].   Please call [phone] with any questions about today's appointment.  Please be sure to schedule follow up at the front  desk before you leave today.   Terisa Starr, MD  Family Medicine

## 2022-08-29 NOTE — Assessment & Plan Note (Signed)
Discussed cessation, continue chantix and cutting down

## 2022-08-29 NOTE — Assessment & Plan Note (Signed)
Has upcoming colonoscopy 

## 2022-08-29 NOTE — Assessment & Plan Note (Signed)
At goal Reviewed medications BMP today STOP amlodipine/losartan Start azor

## 2022-08-29 NOTE — Assessment & Plan Note (Signed)
Updated Medications Doing well

## 2022-08-30 ENCOUNTER — Other Ambulatory Visit: Payer: Self-pay | Admitting: Family Medicine

## 2022-08-30 DIAGNOSIS — Z6827 Body mass index (BMI) 27.0-27.9, adult: Secondary | ICD-10-CM | POA: Diagnosis not present

## 2022-08-30 DIAGNOSIS — L309 Dermatitis, unspecified: Secondary | ICD-10-CM | POA: Diagnosis not present

## 2022-08-30 DIAGNOSIS — E663 Overweight: Secondary | ICD-10-CM | POA: Diagnosis not present

## 2022-08-30 DIAGNOSIS — R030 Elevated blood-pressure reading, without diagnosis of hypertension: Secondary | ICD-10-CM | POA: Diagnosis not present

## 2022-08-30 LAB — BASIC METABOLIC PANEL
BUN/Creatinine Ratio: 18 (ref 9–23)
BUN: 12 mg/dL (ref 6–24)
CO2: 24 mmol/L (ref 20–29)
Calcium: 9.5 mg/dL (ref 8.7–10.2)
Chloride: 102 mmol/L (ref 96–106)
Creatinine, Ser: 0.65 mg/dL (ref 0.57–1.00)
Glucose: 87 mg/dL (ref 70–99)
Potassium: 3.8 mmol/L (ref 3.5–5.2)
Sodium: 141 mmol/L (ref 134–144)
eGFR: 103 mL/min/{1.73_m2} (ref >59)

## 2022-08-30 LAB — HEPATIC FUNCTION PANEL
ALT: 19 IU/L (ref 0–32)
AST: 17 IU/L (ref 0–40)
Albumin: 4.3 g/dL (ref 3.8–4.9)
Alkaline Phosphatase: 110 IU/L (ref 44–121)
Bilirubin Total: <0.2 mg/dL (ref 0.0–1.2)
Bilirubin, Direct: <0.1 mg/dL (ref 0.00–0.40)
Total Protein: 7.1 g/dL (ref 6.0–8.5)

## 2022-08-30 LAB — CBC
Hematocrit: 41.5 % (ref 34.0–46.6)
Hemoglobin: 13.3 g/dL (ref 11.1–15.9)
MCH: 26.8 pg (ref 26.6–33.0)
MCHC: 32 g/dL (ref 31.5–35.7)
MCV: 84 fL (ref 79–97)
Platelets: 343 10*3/uL (ref 150–450)
RBC: 4.97 x10E6/uL (ref 3.77–5.28)
RDW: 13.9 % (ref 11.7–15.4)
WBC: 7.5 10*3/uL (ref 3.4–10.8)

## 2022-08-30 LAB — LIPID PANEL
Chol/HDL Ratio: 3.2 ratio (ref 0.0–4.4)
Cholesterol, Total: 161 mg/dL (ref 100–199)
HDL: 50 mg/dL (ref >39)
LDL Chol Calc (NIH): 94 mg/dL (ref 0–99)
Triglycerides: 94 mg/dL (ref 0–149)
VLDL Cholesterol Cal: 17 mg/dL (ref 5–40)

## 2022-08-30 LAB — HEPATITIS B SURFACE ANTIGEN: Hepatitis B Surface Ag: NEGATIVE

## 2022-08-30 LAB — TSH RFX ON ABNORMAL TO FREE T4: TSH: 1.69 u[IU]/mL (ref 0.450–4.500)

## 2022-08-30 LAB — RPR: RPR Ser Ql: NONREACTIVE

## 2022-08-30 LAB — HIV ANTIBODY (ROUTINE TESTING W REFLEX): HIV Screen 4th Generation wRfx: NONREACTIVE

## 2022-08-30 LAB — HEMOGLOBIN A1C
Est. average glucose Bld gHb Est-mCnc: 131 mg/dL
Hgb A1c MFr Bld: 6.2 % — ABNORMAL HIGH (ref 4.8–5.6)

## 2022-08-30 LAB — HCV AB W REFLEX TO QUANT PCR: HCV Ab: NONREACTIVE

## 2022-08-30 LAB — HCV INTERPRETATION

## 2022-08-30 MED ORDER — ATORVASTATIN CALCIUM 40 MG PO TABS: 40.0000 mg | ORAL_TABLET | Freq: Every day | ORAL | 3 refills | Status: DC

## 2022-09-05 NOTE — Progress Notes (Signed)
Error, duplicate

## 2022-09-06 ENCOUNTER — Telehealth: Payer: Self-pay | Admitting: Pharmacist

## 2022-09-06 NOTE — Telephone Encounter (Signed)
-----   Message from Kathrin Ruddy, RPH-CPP sent at 07/24/2022  3:09 PM EDT ----- Regarding: Tobacco f/u

## 2022-09-06 NOTE — Telephone Encounter (Signed)
Attempted to contact patient for follow-up of tobacco intake reduction / cessation.   Left HIPAA compliant voice mail requesting call back to direct phone: [phone]  Total time with patient call and documentation of interaction: 9 minutes.  Additional follow-up phone call planned: 4 weeks

## 2022-09-10 ENCOUNTER — Other Ambulatory Visit: Payer: Self-pay

## 2022-09-10 NOTE — Progress Notes (Signed)
   Rolayne Schaible Dowdle 11/27/1965 [id number]  Patient outreached by Seward Meth , PharmD Candidate on 09/10/22.  Call dropped while speaking with patient -- did not get to complete discussion but called her back and left her a message with call back number [phone].   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 Is the patient taking their medications as prescribed?: No  Differences from their prescribed list include: Pt states her chantix makes her feel dizzy and lightheaded so she is not able to take 2 tablets a day. She has been taking 1 tablet daily most days and only takes 2 if she knows she will be home all day. She has been smoking between 2-4 cigarettes a day but says the taste has started to become unappealing so she rarely finishes a full one. I let her know Dr. Raymondo Band attempted to reach her on 5/30 regarding smoking cessation and she said she would call him back Thursday or Friday.   The following barriers to adherence were noted: Does the patient feel like one/some of their medications make them feel poorly?: Yes Does the patient have questions or concerns about their medications?: Yes Does the patient have a follow up scheduled with their primary care provider/cardiologist?: No  Pt has an appointment scheduled with her gynecologist on 6/19 where she would like to address hot flashes she has been experiencing as they prevent her from getting a good nights sleep. She has been taking ashwaganda and says it helps occasionally but not enough as she would like. She also has anxiety that prevents her from getting a good nights sleep.     Interventions: Interventions Completed: Medications were reviewed, Patient was educated on goal blood pressures and long term health implications of elevated blood pressure, Patient was counseled on benefit of tobacco cessation    The patient has follow up scheduled:  PCP: Westley Chandler, MD  11/30/22   Seward Meth, Student-PharmD

## 2022-09-13 ENCOUNTER — Telehealth: Payer: Self-pay | Admitting: Family Medicine

## 2022-09-13 NOTE — Telephone Encounter (Signed)
Patient returned a call from our office. Dr. Manson Passey said that she did not call patient. Asked that whoever called her to call her back please.

## 2022-09-21 ENCOUNTER — Telehealth: Payer: Self-pay

## 2022-09-21 NOTE — Telephone Encounter (Signed)
Per ML: "Yes, will discuss and prescribe at 09/26/22 visit. Patient can check if will be covered by her insurance in the meantime. Dr. Seymour Bars"  Pt notified and voiced understanding. Will close encounter.

## 2022-09-21 NOTE — Telephone Encounter (Signed)
Pt LVM in triage line stating that she has been doing some research on HRTs and is wondering if you would be willing to prescribe her one of the new medications called Veozah?  Per encounter 08/08/2022-pt is dealing w/ vasomotor sxs of menopause that are really affecting her daily life.  Pt scheduled for AEX on 09/26/2022.  Mammo UTD. BMP done w/ PCP on 08/29/2022.  Please advise.

## 2022-09-22 ENCOUNTER — Telehealth: Payer: Self-pay | Admitting: Student

## 2022-09-22 NOTE — Telephone Encounter (Signed)
Received page for after-hours emergency line call.  Patient endorses gas pain, radiating to her shoulder blade notes it is like her normal gas pain. She is burping excessively.  She is scared to eat but is able to drink because the burping is so excessive. Denies chest pain. Endorses nausea which resolved with zofran.  No vomiting. She has been taking gas-x, hyoscyamine, lansoprazole.  She notes that she has appointment with her GI doctor this month in addition to being scheduled for colonoscopy.  She has history of GERD, IBS and is requesting suggestions for further assistance.  I recommended that she try Pepto-Bismol if she has not tried that already.  It is reassuring that this is similar to her normal gas pain.  Unfortunately, recommendations for relief are slim given she has experienced this for many years.  I have discussed ED precautions (chest pain/pressure, vomiting).  She noted she will try Pepto-Bismol and call back should she need an appointment this week in our clinic as she declined this when I offered.

## 2022-09-26 ENCOUNTER — Encounter: Payer: Self-pay | Admitting: Obstetrics & Gynecology

## 2022-09-26 ENCOUNTER — Ambulatory Visit (INDEPENDENT_AMBULATORY_CARE_PROVIDER_SITE_OTHER): Payer: Medicaid Other | Admitting: Obstetrics & Gynecology

## 2022-09-26 VITALS — BP 114/78 | HR 73 | Ht 66.25 in | Wt 174.0 lb

## 2022-09-26 DIAGNOSIS — N951 Menopausal and female climacteric states: Secondary | ICD-10-CM

## 2022-09-26 DIAGNOSIS — Z01419 Encounter for gynecological examination (general) (routine) without abnormal findings: Secondary | ICD-10-CM

## 2022-09-26 MED ORDER — ESTRADIOL 0.05 MG/24HR TD PTTW: 1.0000 | MEDICATED_PATCH | TRANSDERMAL | 4 refills | Status: DC

## 2022-09-26 MED ORDER — PROGESTERONE MICRONIZED 100 MG PO CAPS: 100.0000 mg | ORAL_CAPSULE | Freq: Every day | ORAL | 4 refills | Status: DC

## 2022-09-26 NOTE — Progress Notes (Signed)
Bailey Hooper October 18, 1965 [id number]   History:    57 y.o. G2P2L1 Single.  Daughter and grand-children live with her.  She is taking care of her mother.   RP: Established patient presenting for annual gyn exam    HPI: Postmenopause, on no HRT with persistent severe hot flushes.  Wants to [facility] or HRT to manage her menopausal Sxs. No PMB.  No pelvic pain. Condoms as needed.  Pap Neg in 08/2021.  Wanted to remove a skin tag, but it fell off per patient.  Urine and bowel movements normal. Breasts normal.  Mammo Neg 05/2022. Physically active. BMI 27.87. Health labs with family physician. Father Colon Ca. COLONOSCOPY benign polyps on 06-15-20.   Past medical history,surgical history, family history and social history were all reviewed and documented in the EPIC chart.  Gynecologic History Patient's last menstrual period was 02/12/2017.  Obstetric History OB History  Gravida Para Term Preterm AB Living  3 1 1  0 2 1  SAB IAB Ectopic Multiple Live Births  2            # Outcome Date GA Lbr Len/2nd Weight Sex Delivery Anes PTL Lv  3 Term           2 SAB           1 SAB              ROS: A ROS was performed and pertinent positives and negatives are included in the history. GENERAL: No fevers or chills. HEENT: No change in vision, no earache, sore throat or sinus congestion. NECK: No pain or stiffness. CARDIOVASCULAR: No chest pain or pressure. No palpitations. PULMONARY: No shortness of breath, cough or wheeze. GASTROINTESTINAL: No abdominal pain, nausea, vomiting or diarrhea, melena or bright red blood per rectum. GENITOURINARY: No urinary frequency, urgency, hesitancy or dysuria. MUSCULOSKELETAL: No joint or muscle pain, no back pain, no recent trauma. DERMATOLOGIC: No rash, no itching, no lesions. ENDOCRINE: No polyuria, polydipsia, no heat or cold intolerance. No recent change in weight. HEMATOLOGICAL: No anemia or easy bruising or bleeding. NEUROLOGIC: No headache, seizures, numbness,  tingling or weakness. PSYCHIATRIC: No depression, no loss of interest in normal activity or change in sleep pattern.     Exam:   BP 114/78   Pulse 73   Ht 5' 6.25" (1.683 m)   Wt 174 lb (78.9 kg)   LMP 02/12/2017 Comment: not sexually active  SpO2 99%   BMI 27.87 kg/m   Body mass index is 27.87 kg/m.  General appearance : Well developed well nourished female. No acute distress HEENT: Eyes: no retinal hemorrhage or exudates,  Neck supple, trachea midline, no carotid bruits, no thyroidmegaly Lungs: Clear to auscultation, no rhonchi or wheezes, or rib retractions  Heart: Regular rate and rhythm, no murmurs or gallops Breast:Examined in sitting and supine position were symmetrical in appearance, no palpable masses or tenderness,  no skin retraction, no nipple inversion, no nipple discharge, no skin discoloration, no axillary or supraclavicular lymphadenopathy Abdomen: no palpable masses or tenderness, no rebound or guarding Extremities: no edema or skin discoloration or tenderness  Pelvic: Vulva: Normal             Vagina: No gross lesions or discharge  Cervix: No gross lesions or discharge  Uterus  AV, normal size, shape and consistency, non-tender and mobile  Adnexa  Without masses or tenderness  Anus: Normal   Assessment/Plan:  57 y.o. female for annual exam   1. Well female  exam with routine gynecological exam Postmenopause, on no HRT with persistent severe hot flushes.  Wants to [facility] or HRT to manage her menopausal Sxs. No PMB.  No pelvic pain. Condoms as needed.  Pap Neg in 08/2021.  Wanted to remove a skin tag, but it fell off per patient.  Urine and bowel movements normal. Breasts normal.  Mammo Neg 05/2022. Physically active. BMI 27.87. Health labs with family physician. Father Colon Ca. COLONOSCOPY benign polyps on 06-15-20.  2. Menopausal syndrome (hot flushes) Postmenopause, on no HRT with persistent severe hot flushes.  Wants to [facility] or HRT to manage her menopausal  Sxs. No PMB.  No pelvic pain.  Veozah discussed, but very likely not going to be covered by Medicaid.  Decision to start on HRT.  Counseling done, risks and benefits thoroughly reviewed.  No CI.  Estradiol patch 0.05 twice a week and Prometrium 100 mg 1 caps PO HS prescriptions sent to pharmacy.   Other orders - gabapentin (NEURONTIN) 100 MG capsule; Take 100 mg by mouth 3 (three) times daily. - QUEtiapine (SEROQUEL) 50 MG tablet; Take 50 mg by mouth at bedtime. - hydrOXYzine (ATARAX) 10 MG tablet; Take 10 mg by mouth 3 (three) times daily. - estradiol (VIVELLE-DOT) 0.05 MG/24HR patch; Place 1 patch (0.05 mg total) onto the skin 2 (two) times a week. - progesterone (PROMETRIUM) 100 MG capsule; Take 1 capsule (100 mg total) by mouth at bedtime.   Genia Del MD, 3:42 PM

## 2022-10-12 ENCOUNTER — Telehealth: Payer: Self-pay | Admitting: Pharmacist

## 2022-10-12 NOTE — Telephone Encounter (Signed)
-----   Message from Kathrin Ruddy, RPH-CPP sent at 09/06/2022 12:50 PM EDT ----- Regarding: Tobacco Cessation - Varenicline taper?

## 2022-10-12 NOTE — Telephone Encounter (Signed)
Reviewed and agree with Dr Koval's plan.   

## 2022-10-12 NOTE — Telephone Encounter (Signed)
Attempted to contact patient for follow-up of tobacco intake reduction / cessation.  Patient answered initial call at ~ 2:30 PM and stated she was in an appointment with her mother.  She stated she would call back and took my direct phone line number.   I tried patient again at ~ 5:00 PM - end of business and patient was unable to be reached. Voice Mail Box that has not been set-up.   At this time, I believe patient has my direct line: [phone] I will stop making phone calls to patient until contacted/requested by her or her PCP.   Total time with patient call and documentation of interaction: 13 minutes.  Additional follow-up phone call planned: NONE - multiple calls attempted without success.

## 2022-10-24 ENCOUNTER — Telehealth: Payer: Self-pay

## 2022-10-24 NOTE — Telephone Encounter (Signed)
Left message to call back  

## 2022-10-24 NOTE — Telephone Encounter (Signed)
ML pt LVM in triage line stating that ML placed pt on patches/progesterone for vasomotor sxs of menopause instead of Veozah (which she initially wanted to try due to HTN/fx hx of cancer). However, pt states the patches are not helping with her sxs (only using once weekly versus twice) and would like to try the new prescription now if possible.  Per ML note from visit: insurance will likely not cover Veozah.  Spoke w/ pt and she confirmed that she has been using/taking patches/progesterone consistently since prescribed. However, pt is very frustrated due to "having asked ML for something for her vasomotor sxs of menopause now for years and then ML decides to prescribe her HRTs at her last visit with her and now that she has tried and they are not doing much for her and her sxs." Pt would like another providers opinion and to get established with them. Please advise.

## 2022-10-24 NOTE — Telephone Encounter (Signed)
I just reviewed the patient's chart, and I am happy to help.   She has options for care: - Continue the HRT and use the transdermal estrogen 0.05 mg twice a week along with her Prometrium 100 mg q hs.   Different regimens with different dosages may also be more successful to treat symptoms.   - Consider Veozah. Veozah may cause abnormal liver function.  For a patient to take this medication, liver enzymes must be normal prior to initiation.  She would need to have blood work done in the office every 3 months after starting treatment.  A small percentage of patients actually have hot flashes with taking Vezoah.   She could check with her insurance company or her pharmacy about the cost of Veozah and let us know how she would like to proceed.   I hope this helps.

## 2022-10-25 DIAGNOSIS — F410 Panic disorder [episodic paroxysmal anxiety] without agoraphobia: Secondary | ICD-10-CM | POA: Diagnosis not present

## 2022-10-25 DIAGNOSIS — F331 Major depressive disorder, recurrent, moderate: Secondary | ICD-10-CM | POA: Diagnosis not present

## 2022-10-25 DIAGNOSIS — F4320 Adjustment disorder, unspecified: Secondary | ICD-10-CM | POA: Diagnosis not present

## 2022-10-25 NOTE — Telephone Encounter (Signed)
Pt also returned call again this AM.  LVMTCB.

## 2022-10-25 NOTE — Telephone Encounter (Signed)
&/  17/2024 @ 353pm: "Lillia Carmel, CMA  P [facility] Triage Patient is returning a call for triage"

## 2022-10-25 NOTE — Telephone Encounter (Signed)
Pt notified and voiced understanding. Offered her to make an appt with Dr. Edward Jolly to go over other options for vasomotor sxs of menopause and to discuss possible risks/benefits of each.   Pt reports that she lives in [place] and would also have to arrange for assistance with transportation.   Will think about advice/recommendations provided and give [place] a call back if further assistance/appt is desired. Will route to provider for final review and close.

## 2022-10-30 ENCOUNTER — Ambulatory Visit (INDEPENDENT_AMBULATORY_CARE_PROVIDER_SITE_OTHER): Payer: Medicaid Other | Admitting: Clinical

## 2022-10-30 ENCOUNTER — Encounter: Payer: Self-pay | Admitting: Family Medicine

## 2022-10-30 DIAGNOSIS — F3132 Bipolar disorder, current episode depressed, moderate: Secondary | ICD-10-CM | POA: Diagnosis not present

## 2022-10-30 DIAGNOSIS — F411 Generalized anxiety disorder: Secondary | ICD-10-CM

## 2022-10-30 DIAGNOSIS — Z23 Encounter for immunization: Secondary | ICD-10-CM

## 2022-10-30 NOTE — Progress Notes (Signed)
Virtual Visit via Video Note   I connected with Bailey Hooper on 10/30/22 at  4:00 PM EST by a video enabled telemedicine application and verified that I am speaking with the correct person using two identifiers.   Location: Patient: Home Provider: Office   I discussed the limitations of evaluation and management by telemedicine and the availability of in person appointments. The patient expressed understanding and agreed to proceed.   THERAPIST PROGRESS NOTE   Session Time: 4:00 PM-4:45 PM   Participation Level: Active   Behavioral Response: CasualAlertAnxious   Type of Therapy: Individual Therapy   Treatment Goals addressed: Coping   Interventions: CBT   Summary: Bailey Hooper is a 58 y.o. female who presents with Bipolar Disorder./ GAD. The OPT therapist worked with the patient for her scheduled OPT session. The OPT therapist utilized Motivational Interviewing to assist in creating therapeutic repore. The patient in the session was engaged and work in collaboration giving feedback about her triggers and symptoms over the past few weeks. The patient spoke about conflict with her daughter leading up to her having to tell her daughter to leave the home. The conflict with her daughter was a trigger for the patient.The OPT therapist utilized Cognitive Behavioral Therapy through cognitive restructuring as well as worked with the patient on coping strategies to assist in management of mood and as she continues to work on her interactions with family. The patient spoke about frustration interacting with her daughter who also struggles with her own MH.    Suicidal/Homicidal: Nowithout intent/plan   Therapist Response: The OPT therapist worked with the patient for the patients scheduled session. The patient was engaged in her session and gave feedback in relation to triggers, symptoms, and behavior responses over the past few weeks. The OPT therapist worked with the patient utilizing an in session  Cognitive Behavioral Therapy exercise. The patient was responsive in the session and verbalized, " My daughter is so bitter for no reason and she is going through I think I psychotic state she messaged me and said I called the police and got her kicked out of her house, when its not her house its my house".   The OPT therapist worked with the patient on managing her own individual health and being mindful of her own basic care needs including eating, sleeping, exercise, and hygenie. The patient identified her need to focus on herself and realization she cannot take on or change others.. The OPT therapist will continue treatment work with the patient in her next scheduled session.   Plan: Return again in 3 weeks.   Diagnosis:      Axis I: Bipolar Disorder/ GAD                             Axis II: No diagnosis   Collaboration of Care: No additional collaboration of care for this session.    Patient/Guardian was advised Release of Information must be obtained prior to any record release in order to collaborate their care with an outside provider. Patient/Guardian was advised if they have not already done so to contact the registration department to sign all necessary forms in order for [place] to release information regarding their care.    Consent: Patient/Guardian gives verbal consent for treatment and assignment of benefits for services provided during this visit. Patient/Guardian expressed understanding and agreed to proceed      I discussed the assessment and treatment plan with the  patient. The patient was provided an opportunity to ask questions and all were answered. The patient agreed with the plan and demonstrated an understanding of the instructions.   The patient was advised to call back or seek an in-person evaluation if the symptoms worsen or if the condition fails to improve as anticipated.   I provided 45 minutes of non-face-to-face time during this encounter.   Winfred Burn, LCSW    10/30/2022

## 2022-10-31 MED ORDER — SHINGRIX 50 MCG/0.5ML IM SUSR
0.5000 mL | INTRAMUSCULAR | 0 refills | Status: AC
Start: 2022-10-31 — End: 2022-12-31

## 2022-10-31 MED ORDER — TETANUS-DIPHTH-ACELL PERTUSSIS 5-2.5-18.5 LF-MCG/0.5 IM SUSY
0.5000 mL | PREFILLED_SYRINGE | Freq: Once | INTRAMUSCULAR | 0 refills | Status: AC
Start: 2022-10-31 — End: 2022-10-31

## 2022-11-08 ENCOUNTER — Encounter: Payer: Self-pay | Admitting: Family Medicine

## 2022-11-08 DIAGNOSIS — E663 Overweight: Secondary | ICD-10-CM | POA: Diagnosis not present

## 2022-11-08 DIAGNOSIS — Z6828 Body mass index (BMI) 28.0-28.9, adult: Secondary | ICD-10-CM | POA: Diagnosis not present

## 2022-11-08 DIAGNOSIS — R059 Cough, unspecified: Secondary | ICD-10-CM | POA: Diagnosis not present

## 2022-11-08 DIAGNOSIS — J40 Bronchitis, not specified as acute or chronic: Secondary | ICD-10-CM | POA: Diagnosis not present

## 2022-11-08 DIAGNOSIS — R030 Elevated blood-pressure reading, without diagnosis of hypertension: Secondary | ICD-10-CM | POA: Diagnosis not present

## 2022-11-12 DIAGNOSIS — N760 Acute vaginitis: Secondary | ICD-10-CM | POA: Diagnosis not present

## 2022-11-27 ENCOUNTER — Encounter: Payer: Self-pay | Admitting: Family Medicine

## 2022-11-27 ENCOUNTER — Ambulatory Visit (HOSPITAL_COMMUNITY): Payer: Medicaid Other | Admitting: Clinical

## 2022-11-27 DIAGNOSIS — F3132 Bipolar disorder, current episode depressed, moderate: Secondary | ICD-10-CM | POA: Diagnosis not present

## 2022-11-27 DIAGNOSIS — K31A15 Gastric intestinal metaplasia without dysplasia, involving multiple sites: Secondary | ICD-10-CM | POA: Diagnosis not present

## 2022-11-27 DIAGNOSIS — R1013 Epigastric pain: Secondary | ICD-10-CM | POA: Diagnosis not present

## 2022-11-27 DIAGNOSIS — J45909 Unspecified asthma, uncomplicated: Secondary | ICD-10-CM | POA: Diagnosis not present

## 2022-11-27 DIAGNOSIS — F411 Generalized anxiety disorder: Secondary | ICD-10-CM | POA: Diagnosis not present

## 2022-11-27 DIAGNOSIS — K222 Esophageal obstruction: Secondary | ICD-10-CM | POA: Diagnosis not present

## 2022-11-27 DIAGNOSIS — K2950 Unspecified chronic gastritis without bleeding: Secondary | ICD-10-CM | POA: Diagnosis not present

## 2022-11-27 DIAGNOSIS — R112 Nausea with vomiting, unspecified: Secondary | ICD-10-CM | POA: Diagnosis not present

## 2022-11-27 DIAGNOSIS — K31A19 Gastric intestinal metaplasia without dysplasia, unspecified site: Secondary | ICD-10-CM | POA: Diagnosis not present

## 2022-11-27 DIAGNOSIS — F17210 Nicotine dependence, cigarettes, uncomplicated: Secondary | ICD-10-CM | POA: Diagnosis not present

## 2022-11-27 DIAGNOSIS — K3189 Other diseases of stomach and duodenum: Secondary | ICD-10-CM | POA: Diagnosis not present

## 2022-11-27 DIAGNOSIS — K31A0 Gastric intestinal metaplasia, unspecified: Secondary | ICD-10-CM | POA: Diagnosis not present

## 2022-11-27 NOTE — Progress Notes (Signed)
Virtual Visit via Video Note   I connected with Bailey Hooper on 12/07/22 at  2:00 PM EST by a video enabled telemedicine application and verified that I am speaking with the correct person using two identifiers.   Location: Patient: Home Provider: Office   I discussed the limitations of evaluation and management by telemedicine and the availability of in person appointments. The patient expressed understanding and agreed to proceed.   THERAPIST PROGRESS NOTE   Session Time: 2:00 PM-2:30 PM   Participation Level: Active   Behavioral Response: CasualAlertAnxious   Type of Therapy: Individual Therapy   Treatment Goals addressed: Coping   Interventions: CBT   Summary: Bailey Hooper is a 57 y.o. female who presents with Bipolar Disorder./ GAD. The OPT therapist worked with the patient for her scheduled OPT session. The OPT therapist utilized Motivational Interviewing to assist in creating therapeutic repore. The patient in the session was engaged and work in collaboration giving feedback about her triggers and symptoms over the past few weeks. The patient spoke about recently being under the weather, being denied for disability, and plan to move to [place].The OPT therapist utilized Cognitive Behavioral Therapy through cognitive restructuring as well as worked with the patient on coping strategies to assist in management of mood and as she continues to work on housing, family interactions,  finances, physical and mental health. The patient spoke about frustration with the length of the process being on section 8 to transition to a section 8 approved housing in [place].  Suicidal/Homicidal: Nowithout intent/plan   Therapist Response: The OPT therapist worked with the patient for the patients scheduled session. The patient was engaged in her session and gave feedback in relation to triggers, symptoms, and behavior responses over the past few weeks. The OPT therapist worked with the  patient utilizing an in session Cognitive Behavioral Therapy exercise. The patient was responsive in the session and verbalized, " I can't believe how hard it is for a person like myself with mental health to get help with housing and assistance".   The OPT therapist worked with the patient on managing her own individual health and being mindful of her own basic care needs including eating, sleeping, exercise, and hygenie. The patient identified her need to focus on herself and realization she cannot work outside of what is in her control. The OPT therapist will continue treatment work with the patient in her next scheduled session.   Plan: Return again in 3 weeks.   Diagnosis:      Axis I: Bipolar Disorder/ GAD                             Axis II: No diagnosis   Collaboration of Care: No additional collaboration of care for this session.    Patient/Guardian was advised Release of Information must be obtained prior to any record release in order to collaborate their care with an outside provider. Patient/Guardian was advised if they have not already done so to contact the registration department to sign all necessary forms in order for [place] to release information regarding their care.    Consent: Patient/Guardian gives verbal consent for treatment and assignment of benefits for services provided during this visit. Patient/Guardian expressed understanding and agreed to proceed      I discussed the assessment and treatment plan with the patient. The patient was provided an opportunity to ask questions and all were answered. The patient agreed with  the plan and demonstrated an understanding of the instructions.   The patient was advised to call back or seek an in-person evaluation if the symptoms worsen or if the condition fails to improve as anticipated.   I provided 30 minutes of non-face-to-face time during this encounter.   Winfred Burn, LCSW   11/27/2022

## 2022-11-30 ENCOUNTER — Encounter: Payer: Self-pay | Admitting: Family Medicine

## 2022-11-30 ENCOUNTER — Telehealth: Payer: Medicaid Other | Admitting: Family Medicine

## 2022-11-30 DIAGNOSIS — B379 Candidiasis, unspecified: Secondary | ICD-10-CM

## 2022-11-30 DIAGNOSIS — L304 Erythema intertrigo: Secondary | ICD-10-CM

## 2022-11-30 DIAGNOSIS — I10 Essential (primary) hypertension: Secondary | ICD-10-CM

## 2022-11-30 DIAGNOSIS — Z720 Tobacco use: Secondary | ICD-10-CM

## 2022-11-30 DIAGNOSIS — L2084 Intrinsic (allergic) eczema: Secondary | ICD-10-CM | POA: Diagnosis not present

## 2022-11-30 MED ORDER — TRIAMCINOLONE ACETONIDE 0.5 % EX OINT
1.0000 | TOPICAL_OINTMENT | Freq: Two times a day (BID) | CUTANEOUS | 2 refills | Status: DC
Start: 2022-11-30 — End: 2023-09-16

## 2022-11-30 MED ORDER — CICLOPIROX 8 % EX SOLN: Freq: Every day | CUTANEOUS | 0 refills | Status: DC

## 2022-11-30 MED ORDER — NYSTATIN 100000 UNIT/GM EX POWD: 1.0000 | Freq: Two times a day (BID) | CUTANEOUS | 2 refills | Status: DC

## 2022-11-30 MED ORDER — FLUCONAZOLE 150 MG PO TABS
150.0000 mg | ORAL_TABLET | Freq: Once | ORAL | 0 refills | Status: AC
Start: 2022-11-30 — End: 2022-11-30

## 2022-11-30 NOTE — Progress Notes (Signed)
 [facility]icine Visit  Patient consented to have virtual visit and was identified by name and date of birth. Method of visit: Telephone  Encounter participants: Patient: Bailey Hooper - located at home  Provider: Westley Chandler - located at office Others (if applicable): none   Chief Complaint: congestion   HPI:  Bailey Hooper is a pleasant 57 yo with history of HTN, mood disorder, and dyslipidemia presenting  for follow up.   She was recently treated for bronchitis with Levaquin and Prednisone. She is on these currently. She then went to have EGD/CSY. During EGD, she had bronchospasm and CSY was not completed. EGD was relatively unremarkable other than she had some dilation completed.  Biopsies showed chronic gastritis.   She is very concerned about a yeast infection. No new sexual partners. Frequently has candida vaginitis after antibiotics.   She reports compliance with her medications.   She is trying to cut down on smoking. Down to 4-5 per day. She is going to start her Chantix again.   She recently was denied disability.   Home BP readings 130 over 80.    ROS: per HPI  A. SMALL INTESTINE, DUODENUM, BIOPSY:               Small bowel mucosa with slightly increased intraepithelial lymphocytes with preserved villous architecture; see comment.   B. STOMACH, RANDOM, BIOPSY:               Chronic inactive gastritis with focus of intestinal metaplasia.              Negative for dysplasia or malignancy Immunostains for H. pylori pending and the result will be reported in an addendum  Pertinent PMHx:  HTN Gastritis Asthma   Exam:  LMP 02/12/2017 Comment: not sexually active  Respiratory: Speaking in full sentences   Assessment/Plan:  Tobacco abuse Discussed at length Recommend spirometry at follow up   Essential hypertension, benign Continue current medications    Recent ABX use Fluconazole for yeast, discussed coming in for visit if  concerns for other infection   Time spent during visit with patient: 14 minutes

## 2022-11-30 NOTE — Assessment & Plan Note (Signed)
Discussed at length Recommend spirometry at follow up

## 2022-11-30 NOTE — Assessment & Plan Note (Signed)
Continue current medications. 

## 2022-12-12 DIAGNOSIS — E663 Overweight: Secondary | ICD-10-CM | POA: Diagnosis not present

## 2022-12-12 DIAGNOSIS — Z6828 Body mass index (BMI) 28.0-28.9, adult: Secondary | ICD-10-CM | POA: Diagnosis not present

## 2022-12-12 DIAGNOSIS — L84 Corns and callosities: Secondary | ICD-10-CM | POA: Diagnosis not present

## 2022-12-12 DIAGNOSIS — I10 Essential (primary) hypertension: Secondary | ICD-10-CM | POA: Diagnosis not present

## 2022-12-31 ENCOUNTER — Ambulatory Visit (INDEPENDENT_AMBULATORY_CARE_PROVIDER_SITE_OTHER): Payer: Medicaid Other | Admitting: Clinical

## 2022-12-31 DIAGNOSIS — F3132 Bipolar disorder, current episode depressed, moderate: Secondary | ICD-10-CM | POA: Diagnosis not present

## 2022-12-31 DIAGNOSIS — F411 Generalized anxiety disorder: Secondary | ICD-10-CM | POA: Diagnosis not present

## 2022-12-31 NOTE — Progress Notes (Signed)
Virtual Visit via Video Note   I connected with Bailey Hooper on 12/31/22 at  3:00 PM EST by a video enabled telemedicine application and verified that I am speaking with the correct person using two identifiers.   Location: Patient: Home Provider: Office   I discussed the limitations of evaluation and management by telemedicine and the availability of in person appointments. The patient expressed understanding and agreed to proceed.   THERAPIST PROGRESS NOTE   Session Time: 3:00 PM-3:30 PM   Participation Level: Active   Behavioral Response: CasualAlertAnxious   Type of Therapy: Individual Therapy   Treatment Goals addressed: Coping   Interventions: CBT   Summary: Bailey Hooper is a 57 y.o. female who presents with Bipolar Disorder./ GAD. The OPT therapist worked with the patient for her scheduled OPT session. The OPT therapist utilized Motivational Interviewing to assist in creating therapeutic repore. The patient in the session was engaged and work in collaboration giving feedback about her triggers and symptoms over the past few weeks. The patient spoke about stressors including being denied for disability, and not making progress yet in her plan to move to [facility] due to the car breaking down, as well as having to use savings to pay to fix the car.The OPT therapist utilized Cognitive Behavioral Therapy through cognitive restructuring as well as worked with the patient on coping strategies to assist in management of mood and as she continues to work on housing, family interactions,  finances, physical and mental health. The patient spoke about frustration with her daughters living instability and concern for her grandchildren if this doesn't stabilize noting the daughter may have to move back in with her.  Suicidal/Homicidal: Nowithout intent/plan   Therapist Response: The OPT therapist worked with the patient for the patients scheduled session. The patient was engaged in her session  and gave feedback in relation to triggers, symptoms, and behavior responses over the past few weeks. The OPT therapist worked with the patient utilizing an in session Cognitive Behavioral Therapy exercise. The patient was responsive in the session and verbalized, " I wasn't able to make progress with moving because the timing chain on the car went out so then I had to use money I saved to fix the car".   The OPT therapist worked with the patient on managing her own individual health and being mindful of her own basic care needs including eating, sleeping, exercise, and hygenie. The patient identified her need to focus on herself and realization she cannot work outside of what is in her control. The patient spoke about potentially changing her psychiatry provider as she doesn't feel she's making progress currently in her med therapy.The OPT therapist will continue treatment work with the patient in her next scheduled session.   Plan: Return again in 3 weeks.   Diagnosis:      Axis I: Bipolar Disorder/ GAD                             Axis II: No diagnosis   Collaboration of Care: No additional collaboration of care for this session.    Patient/Guardian was advised Release of Information must be obtained prior to any record release in order to collaborate their care with an outside provider. Patient/Guardian was advised if they have not already done so to contact the registration department to sign all necessary forms in order for [place] to release information regarding their care.    Consent: Patient/Guardian  gives verbal consent for treatment and assignment of benefits for services provided during this visit. Patient/Guardian expressed understanding and agreed to proceed      I discussed the assessment and treatment plan with the patient. The patient was provided an opportunity to ask questions and all were answered. The patient agreed with the plan and demonstrated an understanding of the instructions.    The patient was advised to call back or seek an in-person evaluation if the symptoms worsen or if the condition fails to improve as anticipated.   I provided 30 minutes of non-face-to-face time during this encounter.   Winfred Burn, LCSW   12/31/2022

## 2023-01-15 DIAGNOSIS — Z1211 Encounter for screening for malignant neoplasm of colon: Secondary | ICD-10-CM | POA: Diagnosis not present

## 2023-01-15 DIAGNOSIS — K648 Other hemorrhoids: Secondary | ICD-10-CM | POA: Diagnosis not present

## 2023-01-15 DIAGNOSIS — Z800 Family history of malignant neoplasm of digestive organs: Secondary | ICD-10-CM | POA: Diagnosis not present

## 2023-01-15 DIAGNOSIS — Z860100 Personal history of colon polyps, unspecified: Secondary | ICD-10-CM | POA: Diagnosis not present

## 2023-01-15 DIAGNOSIS — K5730 Diverticulosis of large intestine without perforation or abscess without bleeding: Secondary | ICD-10-CM | POA: Diagnosis not present

## 2023-01-15 DIAGNOSIS — Z860101 Personal history of adenomatous and serrated colon polyps: Secondary | ICD-10-CM | POA: Diagnosis not present

## 2023-01-17 DIAGNOSIS — F331 Major depressive disorder, recurrent, moderate: Secondary | ICD-10-CM | POA: Diagnosis not present

## 2023-01-17 DIAGNOSIS — F4320 Adjustment disorder, unspecified: Secondary | ICD-10-CM | POA: Diagnosis not present

## 2023-01-17 DIAGNOSIS — F410 Panic disorder [episodic paroxysmal anxiety] without agoraphobia: Secondary | ICD-10-CM | POA: Diagnosis not present

## 2023-02-06 DIAGNOSIS — F331 Major depressive disorder, recurrent, moderate: Secondary | ICD-10-CM | POA: Diagnosis not present

## 2023-02-06 DIAGNOSIS — F4320 Adjustment disorder, unspecified: Secondary | ICD-10-CM | POA: Diagnosis not present

## 2023-02-06 DIAGNOSIS — F410 Panic disorder [episodic paroxysmal anxiety] without agoraphobia: Secondary | ICD-10-CM | POA: Diagnosis not present

## 2023-02-07 DIAGNOSIS — Z0289 Encounter for other administrative examinations: Secondary | ICD-10-CM

## 2023-02-08 ENCOUNTER — Encounter: Payer: Self-pay | Admitting: Family Medicine

## 2023-02-10 ENCOUNTER — Encounter: Payer: Self-pay | Admitting: Family Medicine

## 2023-02-11 ENCOUNTER — Ambulatory Visit (HOSPITAL_COMMUNITY): Payer: Medicaid Other | Admitting: Clinical

## 2023-02-13 ENCOUNTER — Ambulatory Visit (INDEPENDENT_AMBULATORY_CARE_PROVIDER_SITE_OTHER): Payer: Medicaid Other | Admitting: Clinical

## 2023-02-13 DIAGNOSIS — F411 Generalized anxiety disorder: Secondary | ICD-10-CM

## 2023-02-13 DIAGNOSIS — F3132 Bipolar disorder, current episode depressed, moderate: Secondary | ICD-10-CM

## 2023-02-13 NOTE — Progress Notes (Signed)
Virtual Visit via Video Note   I connected with Bailey Hooper on 02/13/23 at  1:00 PM EST by a video enabled telemedicine application and verified that I am speaking with the correct person using two identifiers.   Location: Patient: Home Provider: Office   I discussed the limitations of evaluation and management by telemedicine and the availability of in person appointments. The patient expressed understanding and agreed to proceed.   THERAPIST PROGRESS NOTE   Session Time: 1:00 PM-1:30 PM   Participation Level: Active   Behavioral Response: CasualAlertAnxious   Type of Therapy: Individual Therapy   Treatment Goals addressed: Coping   Interventions: CBT   Summary: Bailey Hooper is a 57 y.o. female who presents with Bipolar Disorder./ GAD. The OPT therapist worked with the patient for her scheduled OPT session. The OPT therapist utilized Motivational Interviewing to assist in creating therapeutic repore. The patient in the session was engaged and work in collaboration giving feedback about her triggers and symptoms over the past few weeks. The patient spoke about stressors including being denied for disability, and looking for another place to live due to her landlord deciding to sell the home she's renting. 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 The patient spoke about frustration with her relationship with her daughter leading her to not be able to spend time with her grandchildren and acknowledging this to be difficult as we move closer to the upcoming Thanksgiving holiday..   Suicidal/Homicidal: Nowithout intent/plan   Therapist  Response: The OPT therapist worked with the patient for the patients scheduled session. The patient was engaged in her session and gave feedback in relation to triggers, symptoms, and behavior responses over the past few weeks. The OPT therapist worked with the patient utilizing an in session Cognitive Behavioral Therapy exercise. The patient was responsive in the session and verbalized, " I am in line to get an apartment in [place] and the person in front of me got denied so I am hopeful I will here back from the office manager and get  approved".   The OPT therapist worked with the patient on managing her own individual health and being mindful of her own basic care needs including eating, sleeping, exercise, and hygenie. The patient identified her need to focus on herself and realization she cannot work outside of what is in her control. The patient spoke about change with her medication going back on Lexapro through her psychiatrist and hoping this will help her better manage her MH symptoms..The OPT therapist will continue treatment work with the patient in her next scheduled session.   Plan: Return again in 3 weeks.   Diagnosis:      Axis I: Bipolar Disorder/ GAD                             Axis II: No diagnosis   Collaboration of Care: No additional collaboration of care for this session.    Patient/Guardian was advised Release of Information must be obtained prior to any record release in order to collaborate their care with an outside provider. Patient/Guardian was advised if they  have not already done so to contact the registration department to sign all necessary forms in order for [place] to release information regarding their care.    Consent: Patient/Guardian gives verbal consent for treatment and assignment of benefits for services provided during this visit. Patient/Guardian expressed understanding and agreed to proceed      I discussed the assessment and treatment plan with the patient.  The patient was provided an opportunity to ask questions and all were answered. The patient agreed with the plan and demonstrated an understanding of the instructions.   The patient was advised to call back or seek an in-person evaluation if the symptoms worsen or if the condition fails to improve as anticipated.   I provided 30 minutes of non-face-to-face time during this encounter.   Winfred Burn, LCSW   02/13/2023

## 2023-02-21 ENCOUNTER — Ambulatory Visit (INDEPENDENT_AMBULATORY_CARE_PROVIDER_SITE_OTHER): Payer: Medicaid Other | Admitting: Podiatry

## 2023-02-21 DIAGNOSIS — M2141 Flat foot [pes planus] (acquired), right foot: Secondary | ICD-10-CM | POA: Diagnosis not present

## 2023-02-21 DIAGNOSIS — M2142 Flat foot [pes planus] (acquired), left foot: Secondary | ICD-10-CM | POA: Diagnosis not present

## 2023-02-21 NOTE — Progress Notes (Signed)
  Subjective:  Patient ID: Bailey Hooper, female    DOB: [birth year],  MRN: [id number]  No chief complaint on file.   Discussed the use of AI scribe software for clinical note transcription with the patient, who gave verbal consent to proceed.  History of Present Illness          57 year old female, with a history of foot surgery, presents with a painful callus on the foot that has been causing discomfort and imbalance. The patient has been self-managing the callus by trimming it and keeping it clean and dry. The patient also reports a bunion on the side of the foot with a rod and pin, the cause of which is unknown. The patient also has thickened toenails, which they try to keep clean and trimmed. The patient's foot conditions have been causing discomfort, especially when wearing shoes for work. The patient also mentions a pre-diabetic condition but is not on any medication for it.   Objective:    Physical Exam         General: AAO x3, NAD  Dermatological: Hyperkeratotic lesion noted along the navicular tuberosity of the left foot, submetatarsal 2 left right hallux.  There is no underlying ulceration, drainage or any signs of infection.  There is no evidence of foreign body.  There is no soft tissue mass.  Vascular: Dorsalis Pedis artery and Posterior Tibial artery pedal pulses are 2/4 bilateral with immedate capillary fill time.  There is no pain with calf compression, swelling, warmth, erythema.   Neruologic: Grossly intact via light touch bilateral.   Musculoskeletal: Flatfoot is present.  Prominent metatarsal head plantarly.  Nonsignificant tuberosity bilaterally but slightly resulting in the skin lesion, rubbing.  No area pinpoint tenderness.  Gait: Unassisted, Nonantalgic.        Results          Assessment:   Pes planovalgus  Plan:  Patient was evaluated and treated and all questions answered.  Assessment and Plan          Unfortunately I do think that her foot  structure, gait is likely causing pressure in the calluses. I sharply debrided the skin lesions today without any complications or bleeding. We discussed moisturizer daily to the areas.  Discussed biomechanics and shoes, good arch support avoid excess pressure.  Long-term she can consider surgical intervention as needed.   No follow-ups on file.   Vivi Barrack DPM

## 2023-02-21 NOTE — Patient Instructions (Addendum)
You can use UREA NAIL GEL on the toenails  --  For inserts I like Powersteps, superfeet, aetrex  --  Choose a moisturizer from  the list below:  For normal skin: Moisturize feet once daily; do not apply between toes A.  CeraVe Daily Moisturizing Lotion B.  Lubriderm Advanced Therapy Lotion or Lubriderm Intense Skin Repair Lotion C.  Vaseline Intensive Care Lotion D.  Gold Bond Ultimate Diabetic Foot Lotion E.  Eucerin Intensive Repair Moisturizing Lotion  For extremely dry, cracked feet: moisturize feet once daily; do not apply between toes A. CeraVe Healing Ointment B. Eucerin Aquaphor Repairing Ointment (may be labeled Aquaphor Healing Ointment) C. Vaseline Petroleum Healing Jelly   If you have problems reaching your feet: apply to feet once daily; do not apply between toes A.  Eucerin Aquaphor Ointment Body Spray  B.  Vaseline Intensive Care Spray Moisturizer (Unscented,  Cocoa Radiant Spray or Aloe Smooth Spray)

## 2023-02-26 NOTE — Progress Notes (Unsigned)
SUBJECTIVE:   CHIEF COMPLAINT: BP check  HPI:   Bailey Hooper is a 57 y.o.  with history notable for HTN and tobacco use presenting for follow up.  She reports in terms of her blood pressure she did not take her pills today.  She checks her blood pressure regularly as she works in the [employer].  Her blood pressure outside of the office when she is relaxed is in the 130s over 80s. Denies changes in headache CP, dyspnea  She reports not using Chantix. Not yet ready to set date.  She reports multiple stressors.  Her daughter moved out and this has been a stressor for her.  She is living with her mom during the weekday and her mom is developing some memory impairment.  The patient has to monitor her mom and help her with a lot of activities of daily living.  The patient also has stress at work.  She regularly sees a therapist and psychiatrist.  The patient reports she is having sinus pressure and mild headaches.  Does not feel like her typical migraine.  She gets sinus infections about twice a year.  She has had some congestion stuffiness and just feeling unwell.  She has postnasal drip.  She has been using her allergy medicines as well as over-the-counter nonsteroidal medications for her symptoms.  She denies fevers, chills, chest pain.  The patient reports her hot flashes are severe.  She is not taking her hormone replacement therapy prescribed at gynecology after discussion.  Patient is interested in both a low-dose CT as well as spirometry.  She is curious in getting tested for Helix DNA as well as LP(a).  We discussed the limitations of both of these and reviewed her has a family history in detail.  She does not qualify for BRCA testing.  PERTINENT  PMH / PSH/Family/Social History : Updated and reviewed as appropriate Her father sister had breast cancer, family member had oral cancer and her father had colon cancer.  She does follow with gastroenterology. OBJECTIVE:   BP 132/85    Pulse 74   Ht 5\' 6"  (1.676 m)   Wt 176 lb 6.4 oz (80 kg)   LMP 02/12/2017 Comment: not sexually active  SpO2 97%   BMI 28.47 kg/m   Today's weight:  Last Weight  Most recent update: 02/27/2023  8:28 AM    Weight  80 kg (176 lb 6.4 oz)            Review of prior weights: Filed Weights   02/27/23 0827  Weight: 176 lb 6.4 oz (80 kg)   HEENT: EOMI. Sclera without injection or icterus. MMM. External auditory canal examined and WNL. TM normal appearance, no erythema or bulging. R TM with small effusion.  Neck: Supple.  Cardiac: Regular rate and rhythm. Normal S1/S2. No murmurs, rubs, or gallops appreciated. Lungs: Clear bilaterally to ascultation.  Abdomen: Normoactive bowel sounds. No tenderness to deep or light palpation. No rebound or guarding.    Neuro: Normal speech Ext: no edema   Psych: Pleasant and appropriate  Skin exam the patient has some scaling and flaking around her nasal bridge with 1 papule over her zygomatic process on the left that is erythematous. On chest wall there is mild erythema, no papules, pustules No hyper/hypopigmentation   ASSESSMENT/PLAN:   Assessment & Plan Essential hypertension, benign At goal also at goal as an outpatient.  Repeat labs today. NAFLD (nonalcoholic fatty liver disease) Hepatic panel today Discussed  GLP1  Prediabetes Repeat today  Discussed dietary and exercise changes.  She is pleased with her weight and remains very active.  She is determined to quit smoking as below. Tobacco abuse Not yet ready to set quit date.  Supportive listening provided.  Chantix prescribed. The patient continues to smoke about a third of a pack a day.  Counseling provided.  Chantix prescribed CT ordered. Intertrigo Nystatin refilled Papules Recommend cessation of applying Kenalog to face.  She has some irritation but no overt papules pustules vesicles or other primary rash on her chest wall.  Recommended unscented soap unscented detergent and  application of Kenalog twice weekly as needed to area.  History of cholesterol, LP(a) ordered, we discussed the limitations of this.  Follow-up to 3 months  Terisa Starr, MD  Family Medicine Teaching Service  [facility]

## 2023-02-27 ENCOUNTER — Encounter: Payer: Self-pay | Admitting: Family Medicine

## 2023-02-27 ENCOUNTER — Ambulatory Visit: Payer: Medicaid Other | Admitting: Family Medicine

## 2023-02-27 VITALS — BP 132/85 | HR 74 | Ht 66.0 in | Wt 176.4 lb

## 2023-02-27 DIAGNOSIS — R7303 Prediabetes: Secondary | ICD-10-CM | POA: Diagnosis not present

## 2023-02-27 DIAGNOSIS — R238 Other skin changes: Secondary | ICD-10-CM

## 2023-02-27 DIAGNOSIS — K760 Fatty (change of) liver, not elsewhere classified: Secondary | ICD-10-CM

## 2023-02-27 DIAGNOSIS — I10 Essential (primary) hypertension: Secondary | ICD-10-CM

## 2023-02-27 DIAGNOSIS — Z720 Tobacco use: Secondary | ICD-10-CM | POA: Diagnosis not present

## 2023-02-27 DIAGNOSIS — L304 Erythema intertrigo: Secondary | ICD-10-CM | POA: Diagnosis not present

## 2023-02-27 DIAGNOSIS — H8111 Benign paroxysmal vertigo, right ear: Secondary | ICD-10-CM

## 2023-02-27 MED ORDER — AMOXICILLIN-POT CLAVULANATE 875-125 MG PO TABS: 1.0000 | ORAL_TABLET | Freq: Two times a day (BID) | ORAL | 0 refills | Status: DC

## 2023-02-27 MED ORDER — VARENICLINE TARTRATE 0.5 MG PO TABS: ORAL_TABLET | ORAL | 5 refills | Status: DC

## 2023-02-27 MED ORDER — FLUCONAZOLE 150 MG PO TABS: 150.0000 mg | ORAL_TABLET | Freq: Once | ORAL | 0 refills | Status: AC

## 2023-02-27 MED ORDER — ONDANSETRON 4 MG PO TBDP: ORAL_TABLET | ORAL | 0 refills | Status: AC

## 2023-02-27 MED ORDER — CICLOPIROX 8 % EX SOLN: Freq: Every day | CUTANEOUS | 0 refills | Status: AC

## 2023-02-27 MED ORDER — NYSTATIN 100000 UNIT/GM EX POWD: 1.0000 | Freq: Two times a day (BID) | CUTANEOUS | 2 refills | Status: AC

## 2023-02-27 NOTE — Assessment & Plan Note (Signed)
Hepatic panel today Discussed GLP1

## 2023-02-27 NOTE — Assessment & Plan Note (Signed)
At goal also at goal as an outpatient.  Repeat labs today.

## 2023-02-27 NOTE — Patient Instructions (Addendum)
It was wonderful to see you today.  Please bring ALL of your medications with you to every visit.   Today we talked about:  I suspect you have a viral infection I recommend continuing your current medications  IF your symptoms worsen or you have fevers or green drainage please fill your antibiotics   Our lab at our clinic is closed today. I apologize for this issue.  You can  Go to [address] first floor lab corp today (across the street near Cardiology)  OR 2. Return at a later day for labs  If you choose to return for a later date, please schedule a visit  The orders are placed    Please schedule with Dr. Raymondo Band for lunch function testing My nurse will message you about a CAT scan of your lungs   I sent in your refills      Face Rash I think this is seborrhea  I recommend gentle cleanser twice a day like Cetaphil  I have referred you to Dermatology to further evaluate your concern. If you do not received a phone call about this appointment within 3-4 weeks, please call our office back at [phone]. Clemencia Course coordinates our referrals and can assist you in this.    Tobacco use is damaging to your body. It increases your risk of stroke, heart attack, lung cancer, and serious lung disease in the future. It also reduces your fertility.   Quitting tobacco is the best thing for your health but is a challenge---nicotine, a chemical in cigarettes, is highly addictive.   You can call 1 800 QUIT NOW (712-143-7637)---you will be connected with a Careers information officer. They can also mail you nicotine gums, lozenges, and patches to quit.   Ask me about patches (which you wear all day) and gums (which you use when you have a craving) to help you quit.   There are safe, effective medications to help you quit--  Varencline---also called Chantix---- is the most common medication used to help people stop smoking. It starts a low dose and is increased. I recommended  choosing a quit date then starting the medication 8 days before this. Side effects include mild headache, difficulty sleeping, and odd dreams. The medication is typically very well tolerated.     Bupropion---also called Zyban---- is started 1 week before your quit date. You take 1 pill for three days then increase to 1 pill twice per day. Side effects include a mild headache and anxiety---this usually goes away. Some patients experience weight loss.    Please follow up in 2-3 months   Thank you for choosing [facility].   Please call [phone] with any questions about today's appointment.  Please be sure to schedule follow up at the front  desk before you leave today.   Terisa Starr, MD  Family Medicine

## 2023-02-27 NOTE — Assessment & Plan Note (Signed)
Not yet ready to set quit date.  Supportive listening provided.  Chantix prescribed. The patient continues to smoke about a third of a pack a day.  Counseling provided.  Chantix prescribed CT ordered.

## 2023-03-04 ENCOUNTER — Ambulatory Visit: Payer: Medicaid Other | Admitting: Podiatry

## 2023-03-05 ENCOUNTER — Encounter: Payer: Self-pay | Admitting: Family Medicine

## 2023-03-05 ENCOUNTER — Telehealth: Payer: Self-pay

## 2023-03-05 ENCOUNTER — Ambulatory Visit (INDEPENDENT_AMBULATORY_CARE_PROVIDER_SITE_OTHER): Payer: Medicaid Other | Admitting: Pharmacist

## 2023-03-05 ENCOUNTER — Encounter: Payer: Self-pay | Admitting: Pharmacist

## 2023-03-05 VITALS — BP 150/80 | HR 65 | Wt 175.6 lb

## 2023-03-05 DIAGNOSIS — G5600 Carpal tunnel syndrome, unspecified upper limb: Secondary | ICD-10-CM

## 2023-03-05 DIAGNOSIS — K760 Fatty (change of) liver, not elsewhere classified: Secondary | ICD-10-CM | POA: Diagnosis not present

## 2023-03-05 DIAGNOSIS — Z720 Tobacco use: Secondary | ICD-10-CM | POA: Diagnosis not present

## 2023-03-05 DIAGNOSIS — I10 Essential (primary) hypertension: Secondary | ICD-10-CM | POA: Diagnosis not present

## 2023-03-05 LAB — POCT GLYCOSYLATED HEMOGLOBIN (HGB A1C): Hemoglobin A1C: 6 % — AB (ref 4.0–5.6)

## 2023-03-05 NOTE — Telephone Encounter (Signed)
Patient calls nurse line requesting an update on referral.   She reports she was told to call back if she did not hear anything.   Dermatology referral was placed on 11/20. She reports PCP wrote on her AVS the place she was being referred to. She was inquiring if the provider was female or female.   I do not see where PCP specified a provider in referral note.   Will forward to Referral Coordinator.   Of note, she does not care the gender of the provider.

## 2023-03-05 NOTE — Progress Notes (Signed)
S:       Chief Complaint  Patient presents with   Medication Management    PFTs/Smoking cessation   57 y.o. female who presents for diabetes evaluation, education, and management. Patient arrives in good spirits and presents without any assistance.  Patient was referred and last seen by Primary Care Provider, Dr. Manson Passey, on 02/27/23.   PMH is significant for asthma (in early adulthood), bronchitis.  At last visit, seen for recent URI and discussed tobacco dependence. Not yet ready to set quit date, Chantix (varenicline) recently prescribed.   At current visit, patient complains about persistent hot flashes that disrupt her sleep and QOL. She has tried cutting out sweets and keeps a fan with her at all times. She is not interested in HRT prescribed by PCP due to a family history of pulmonary embolism, which greatly concerns her. Discussed Veozah (fezolinetant)  but also not an option due to history of NAFLD.  Currently smokes about 4 cigarettes/day. Longest period of recent abstinence was a couple days. Has quit for longer periods of time when her daughter was younger/in the house. Stress is primary trigger.  Previously has smoked up to 10 cigarettes/day, Newport brand. Started smoking in late 20s.   HTN: Checks blood pressures at home, typically sees 130s-140s/80s.  Patient reports breathing has been poor. Gets short of breath while at rest.   Medication adherence is good, adherent to all medications. Patient reports last dose of COPD medications (albuterol) was Sunday 11/24.  Current COPD medications: albuterol 90 mcg 2 puffs every 6 hours PRN wheezing  Rescue inhaler use frequency: more frequently with hot flashes, change in seasons.  Patient exacerbation hx: No ED visits. Urgent care visit for SOB/cough, diagnosed with bronchitis   O: Review of Systems  All other systems reviewed and are negative.   Physical Exam Vitals reviewed.  Constitutional:      Appearance: Normal  appearance.  Pulmonary:     Effort: Pulmonary effort is normal.  Neurological:     Mental Status: She is alert.  Psychiatric:        Mood and Affect: Mood normal.        Behavior: Behavior normal.        Thought Content: Thought content normal.        Judgment: Judgment normal.     Vitals:   03/05/23 1438 03/05/23 1442  BP: (!) 141/93 (!) 150/80  Pulse: 65   SpO2: 100%     mMRC score= >2  See "scanned report" or Documentation Flowsheet (discrete results - PFTs) for Spirometry results. Patient provided good effort while attempting spirometry.   Lung Age = 24  Patient is participating in a Managed Medicaid Plan:  Yes  A/P: Patient has been experiencing cough/SOB and taking albuterol PRN, varenicline (Chantix) in the past. Spirometry evaluation with pre- and post-bronchodilator reveals near normal lung function with lung age of 70.  Patient with good inhaler technique. Patient medication adherence good  -- Restarted varenicline 0.5 mg once daily with dinner x 1 week, then 0.5 mg twice daily thereafter. -- No goal for cigarette reduction but ready to quit.  -- Continue to use non-pharm methods and plan for future quit date.  -- Educated patient on purpose, proper use, potential adverse effects including risk of esophageal candidiasis and need to rinse mouth after each use.   -- Reviewed results of pulmonary function tests. Pt verbalized understanding of results and education.   -- Due to history of asthma - patient  will utilize Peak Flow meter to assess overall control of her breathing outside the office and specifically when she has symptoms like wheezing or nocturnal awakening.   Written patient instructions provided.   Total time in face to face counseling 54 minutes.    Follow-up:  Pharmacist 04/16/23 PCP clinic visit PRN Patient seen with Lendon Ka, PharmD Candidate and Shona Simpson, PharmD Candidate.

## 2023-03-05 NOTE — Assessment & Plan Note (Signed)
Patient has been experiencing cough/SOB and taking albuterol PRN, varenicline (Chantix) in the past. Spirometry evaluation with pre- and post-bronchodilator reveals near normal lung function with lung age of 19.  Patient with good inhaler technique. Patient medication adherence good  -- Restarted varenicline 0.5 mg once daily with dinner x 1 week, then 0.5 mg twice daily thereafter. -- No goal for cigarette reduction but ready to quit.  -- Continue to use non-pharm methods and plan for future quit date.  -- Educated patient on purpose, proper use, potential adverse effects including risk of esophageal candidiasis and need to rinse mouth after each use.   -- Reviewed results of pulmonary function tests.

## 2023-03-05 NOTE — Patient Instructions (Signed)
It was great to see you today!   Medication Changes: START varenicline (Chantix) 0.5 mg ONE tablet daily for 7 days, then one tablet TWICE daily thereafter.  Continue all other medications the same.   Continue to use your peak flow meter and write down your numbers for the next visit.   Tobacco Patient Instructions  Quitting smoking is one of the most important decisions you can make for your current and future health. Consider what you dislike about smoking and how quitting could personally benefit you. Try to cut down. Aim for reducing the amount you smoke by 1 cigarette over the next 4 weeks.   Starting today, Be a Quitter!  Remind yourself why you want to quit.  Delay your first cigarette of the day for as long as possible.  Start cleaning out all pockets, drawers, and your car of cigarettes.  Getting Through the Cravings Once You Are Smoke Free: Each craving will last about 10 minutes, whether or not you smoke. Here's how to get through the cravings without cigarettes:  DELAY: Tell yourself that you'll wait for the next craving. Do it every time! DEEP BREATHS: One reason smoking feels good is because you breathe in deeply to inhale. Take four slow, deep breaths and feel the relaxation without the hamful effects of cigarettes. DRINK WATER: Drink a glass of cool water. It will give your hands and mouth something to do and will help flush the nicotine out of your system faster. DIVERT: Do something else -- brush your teeth, take a walk, call a friend who can offer you support. Just moving onto something other than thinking about cigarettes will move you through the craving.   Frequently Asked Questions  What can I do when I get the urge to smoke? To get through the urge to smoke, try the following:  Review your reasons for quitting and think of all the benefits to your health, your finances, and your family.  Remind yourself that there is no such thing as just one cigarette -- or  even one puff.  Ride out the desire to smoke. Use the 4 Os -- Delay, Deep Breaths, Drink Water and Divert to get you through. The craving will go away eventually. Do not fool yourself into thinking you can have just one cigarette.  Any tips on how to deal with stress? Stress is a natural part of life. The key is to deal with it without reaching for a cigarette. Taking deep breaths, counting backwards from 10 and asking yourself 1-how big a deal is this?"  Writing down your feelings, talking with a friend and doing things like positive self-talk and meditation are some other ways that people deal with daily stress.  What if I start smoking again? Slips happen. Most people try to quit smoking a few times before they are successful. Don't beat yourself up if this happens to you! Ask yourself if this was a slip or a relapse. A slip is a one-time mistake that is quickly corrected. A relapse is going back to your old smoking habits.   If you slip, don't give up. Think of it as a learning experience. Ask yourself what went wrong and renew your commitment to staying away from smoking for good.  If you relapse, try not to get discouraged. Ask yourself the question "What caused me to start smoking?" Figure out what helped you and what didn't when you tried to quit. Knowing why you relapsed is useful information for your next  attempt to quit.

## 2023-03-06 LAB — BASIC METABOLIC PANEL
BUN/Creatinine Ratio: 15 (ref 9–23)
BUN: 12 mg/dL (ref 6–24)
CO2: 28 mmol/L (ref 20–29)
Calcium: 9.4 mg/dL (ref 8.7–10.2)
Chloride: 103 mmol/L (ref 96–106)
Creatinine, Ser: 0.79 mg/dL (ref 0.57–1.00)
Glucose: 72 mg/dL (ref 70–99)
Potassium: 4.2 mmol/L (ref 3.5–5.2)
Sodium: 143 mmol/L (ref 134–144)
eGFR: 87 mL/min/{1.73_m2} (ref >59)

## 2023-03-06 LAB — HEPATIC FUNCTION PANEL
ALT: 17 [IU]/L (ref 0–32)
AST: 13 [IU]/L (ref 0–40)
Albumin: 4.4 g/dL (ref 3.8–4.9)
Alkaline Phosphatase: 124 [IU]/L — ABNORMAL HIGH (ref 44–121)
Bilirubin Total: 0.3 mg/dL (ref 0.0–1.2)
Bilirubin, Direct: 0.12 mg/dL (ref 0.00–0.40)
Total Protein: 7.3 g/dL (ref 6.0–8.5)

## 2023-03-06 LAB — LIPOPROTEIN A (LPA): Lipoprotein (a): 57.3 nmol/L (ref <75.0)

## 2023-03-06 NOTE — Progress Notes (Signed)
Reviewed and agree with Dr Koval's plan.   

## 2023-03-13 ENCOUNTER — Ambulatory Visit (HOSPITAL_COMMUNITY)
Admission: RE | Admit: 2023-03-13 | Discharge: 2023-03-13 | Disposition: A | Discharge status: Home / Self Care | Payer: Medicaid Other | Source: Ambulatory Visit | Attending: Family Medicine | Admitting: Family Medicine

## 2023-03-13 DIAGNOSIS — F17210 Nicotine dependence, cigarettes, uncomplicated: Secondary | ICD-10-CM | POA: Diagnosis not present

## 2023-03-13 DIAGNOSIS — Z720 Tobacco use: Secondary | ICD-10-CM | POA: Diagnosis not present

## 2023-03-18 ENCOUNTER — Ambulatory Visit (INDEPENDENT_AMBULATORY_CARE_PROVIDER_SITE_OTHER): Payer: Medicaid Other | Admitting: Clinical

## 2023-03-18 DIAGNOSIS — F411 Generalized anxiety disorder: Secondary | ICD-10-CM

## 2023-03-18 DIAGNOSIS — F3132 Bipolar disorder, current episode depressed, moderate: Secondary | ICD-10-CM

## 2023-03-18 NOTE — Progress Notes (Signed)
Virtual Visit via Video Note   I connected with Bailey Hooper on 03/18/23 at  3:00 PM EST by a video enabled telemedicine application and verified that I am speaking with the correct person using two identifiers.   Location: Patient: Home Provider: Office   I discussed the limitations of evaluation and management by telemedicine and the availability of in person appointments. The patient expressed understanding and agreed to proceed.   THERAPIST PROGRESS NOTE   Session Time: 3:00 PM-3:30 PM   Participation Level: Active   Behavioral Response: CasualAlertAnxious   Type of Therapy: Individual Therapy   Treatment Goals addressed: Coping   Interventions: CBT   Summary: Bailey Hooper is a 57 y.o. female who presents with Bipolar Disorder./ GAD. The OPT therapist worked with the patient for her scheduled OPT session. The OPT therapist utilized Motivational Interviewing to assist in creating therapeutic repore. The patient in the session was engaged and work in collaboration giving feedback about her triggers and symptoms over the past few weeks. The patient spoke about ongoing work to get disability, and work to get approved through section 8 for housing in [place].The OPT therapist utilized Cognitive Behavioral Therapy through cognitive restructuring as well as worked with the patient on coping strategies to assist in management of mood and as she continues to work on housing, family interactions,  finances, physical and mental health. The patient spoke about frustration with her relationship with her daughter , however, noted she is doing better in managing the impact of her relationship with her daughter.    Suicidal/Homicidal: Nowithout intent/plan   Therapist Response: The OPT therapist worked with the patient for the patients scheduled session. The patient was engaged in her session and gave feedback in relation to triggers, symptoms, and behavior responses over the past few weeks. The OPT  therapist worked with the patient utilizing an in session Cognitive Behavioral Therapy exercise. The patient was responsive in the session and verbalized, " I am just waiting to get the discharge form from work for section 8 housing".   The OPT therapist worked with the patient on managing her own individual health and being mindful of her own basic care needs including eating, sleeping, exercise, and hygenie. The patient identified her need to focus on herself and realization she cannot work outside of what is in her control. The patient spoke about change with her medication working better in management of her symptoms post recent adjustment through med therapy. The patient is looking to get into a gym routine as we do into next year.The OPT therapist will continue treatment work with the patient in her next scheduled session.   Plan: Return again in 3 weeks.   Diagnosis:      Axis I: Bipolar Disorder/ GAD                             Axis II: No diagnosis   Collaboration of Care: No additional collaboration of care for this session.    Patient/Guardian was advised Release of Information must be obtained prior to any record release in order to collaborate their care with an outside provider. Patient/Guardian was advised if they have not already done so to contact the registration department to sign all necessary forms in order for [place] to release information regarding their care.    Consent: Patient/Guardian gives verbal consent for treatment and assignment of benefits for services provided during this visit. Patient/Guardian expressed understanding and  agreed to proceed      I discussed the assessment and treatment plan with the patient. The patient was provided an opportunity to ask questions and all were answered. The patient agreed with the plan and demonstrated an understanding of the instructions.   The patient was advised to call back or seek an in-person evaluation if the symptoms worsen or  if the condition fails to improve as anticipated.   I provided 30 minutes of non-face-to-face time during this encounter.   Winfred Burn, LCSW   03/18/2023

## 2023-03-28 ENCOUNTER — Encounter: Payer: Self-pay | Admitting: Family Medicine

## 2023-04-01 DIAGNOSIS — F410 Panic disorder [episodic paroxysmal anxiety] without agoraphobia: Secondary | ICD-10-CM | POA: Diagnosis not present

## 2023-04-01 DIAGNOSIS — F331 Major depressive disorder, recurrent, moderate: Secondary | ICD-10-CM | POA: Diagnosis not present

## 2023-04-01 DIAGNOSIS — F4320 Adjustment disorder, unspecified: Secondary | ICD-10-CM | POA: Diagnosis not present

## 2023-04-16 ENCOUNTER — Encounter: Payer: Self-pay | Admitting: Pharmacist

## 2023-04-16 ENCOUNTER — Ambulatory Visit: Payer: Medicaid Other | Admitting: Pharmacist

## 2023-04-16 VITALS — BP 171/107 | HR 65 | Wt 180.0 lb

## 2023-04-16 DIAGNOSIS — I10 Essential (primary) hypertension: Secondary | ICD-10-CM | POA: Diagnosis not present

## 2023-04-16 DIAGNOSIS — Z720 Tobacco use: Secondary | ICD-10-CM

## 2023-04-16 DIAGNOSIS — J45909 Unspecified asthma, uncomplicated: Secondary | ICD-10-CM

## 2023-04-16 MED ORDER — ALBUTEROL SULFATE (2.5 MG/3ML) 0.083% IN NEBU: 2.5000 mg | INHALATION_SOLUTION | Freq: Four times a day (QID) | RESPIRATORY_TRACT | 1 refills | Status: DC | PRN

## 2023-04-16 MED ORDER — VARENICLINE TARTRATE 0.5 MG PO TABS: 0.5000 mg | ORAL_TABLET | Freq: Two times a day (BID) | ORAL | 5 refills | Status: DC

## 2023-04-16 MED ORDER — OLMESARTAN-AMLODIPINE-HCTZ 40-10-25 MG PO TABS: 1.0000 | ORAL_TABLET | Freq: Every day | ORAL | 5 refills | Status: DC

## 2023-04-16 MED ORDER — BUDESONIDE-FORMOTEROL FUMARATE 160-4.5 MCG/ACT IN AERO: 2.0000 | INHALATION_SPRAY | Freq: Two times a day (BID) | RESPIRATORY_TRACT | 3 refills | Status: DC

## 2023-04-16 NOTE — Patient Instructions (Addendum)
 Nice to see you today!   Medication Changes: START Varenicline  0.5mg  once daily for 1 week THEN increase to one tablet TWICE daily - always with food.   Start - Symbicort  2 puffs Twice Daily   STOP amlodipine /olmesartan  START Amlodipine /olmesartan /hydrochlorothiazide  combination - 1 daily   Continue all other medication the same.   I look forward to seeing you again in February  Tobacco Patient Instructions  Quitting smoking is one of the most important decisions you can make for your current and future health. Consider what you dislike about smoking and how quitting could personally benefit you. Try to cut down. Aim for reducing the amount you smoke to 3 or less every day moving forward.   My target quit date is: early 2025  Starting today, Be a Quitter!  Remind yourself why you want to quit.  Delay your first cigarette of the day for as long as possible.  Start cleaning out all pockets, drawers, and your car of cigarettes.  Getting Through the Cravings Once You Are Smoke Free: Each craving will last about 10 minutes, whether or not you smoke. Here's how to get through the cravings without cigarettes:  DELAY: Tell yourself that you'll wait for the next craving. Do it every time! DEEP BREATHS: One reason smoking feels good is because you breathe in deeply to inhale. Take four slow, deep breaths and feel the relaxation without the hamful effects of cigarettes. DRINK WATER: Drink a glass of cool water. It will give your hands and mouth something to do and will help flush the nicotine  out of your system faster. DIVERT: Do something else -- brush your teeth, take a walk, call a friend who can offer you support. Just moving onto something other than thinking about cigarettes will move you through the craving.   Frequently Asked Questions  What can I do when I get the urge to smoke? To get through the urge to smoke, try the following:  Review your reasons for quitting and think of  all the benefits to your health, your finances, and your family.  Remind yourself that there is no such thing as just one cigarette -- or even one puff.  Ride out the desire to smoke. Use the 4 Os -- Delay, Deep Breaths, Drink Water and Divert to get you through. The craving will go away eventually. Do not fool yourself into thinking you can have just one cigarette.  Any tips on how to deal with stress? Stress is a natural part of life. The key is to deal with it without reaching for a cigarette. Taking deep breaths, counting backwards from 10 and asking yourself 1-how big a deal is this?"  Writing down your feelings, talking with a friend and doing things like positive self-talk and meditation are some other ways that people deal with daily stress.  What if I start smoking again? Slips happen. Most people try to quit smoking a few times before they are successful. Don't beat yourself up if this happens to you! Ask yourself if this was a slip or a relapse. A slip is a one-time mistake that is quickly corrected. A relapse is going back to your old smoking habits.   If you slip, don't give up. Think of it as a learning experience. Ask yourself what went wrong and renew your commitment to staying away from smoking for good.  If you relapse, try not to get discouraged. Ask yourself the question "What caused me to start smoking?" Figure out what  helped you and what didn't when you tried to quit. Knowing why you relapsed is useful information for your next attempt to quit.

## 2023-04-16 NOTE — Assessment & Plan Note (Signed)
 Hypertension - poorly controlled today, possibly related to increased stressors due to recent move back from [place], [place] to [place].  She is interested in adjustment in her therapy due to elevated readings despite adherence with current regimen.  - Changed amlodipoine/olmesartan  to triple therapy from amlodipine /olmesartan /hydrochlorothiazide  - Continued metoprolol  XL 50mg  daily

## 2023-04-16 NOTE — Assessment & Plan Note (Signed)
 Tobacco use disorder with mild nicotine  dependence of ~ 30 years duration in a patient who is good candidate for success because of current level of motivation and recent progress with intake reduction.    -Reinitiated varenicline  0.5 mg by mouth once daily with food x7 days, then 0.5 mg by mouth twice daily with food thereafter. Patient counseled on purpose, proper use, and potential adverse effects, including GI upset. - Goal is to decrease intake further to 3 or less moving forward.  -Provided encouragement to contact 1 800-QUIT NOW support program.

## 2023-04-16 NOTE — Progress Notes (Signed)
 S:   Chief Complaint  Patient presents with   Medication Management    Tobacco Use - Intake Reduction   58 y.o. female who presents for tobacco intake reduction assistance. Patient arrives in good spirits and presents without any aid.   Patient was referred and last seen by Primary Care Provider, Dr. Delores, on 02/27/23.    PMH is significant for asthma (in early adulthood), bronchitis.  At last visit, seen for recent URI and discussed tobacco dependence. She was not yet ready to set quit date, Chantix  (varenicline ) recently prescribed but patient has not yet started.  She requests new prescription.    Currently smokes 2-4 cigarettes/day. Longest period of recent abstinence was a couple days. Has quit for longer periods of time when her daughter was younger/in the house. Stress is primary trigger.  Previously has smoked up to 10 cigarettes/day.   HTN: Checks blood pressures at home, typically sees 130s-140s/80s.  Patient reports breathing has been poor. Gets short of breath while at rest.  Uses Albuterol  PRN.    Medication adherence is good, adherent to all medications.  Age when started using tobacco on a daily basis [age]  (1994). Brand smoked [place]. Number of cigarettes/day 2-4.  Estimated nicotine  content per cigarette (mg) 1.  Estimated nicotine  intake per day 2-4mg .   Denies waking to smoke at night.    Longest time ever been tobacco free  3-6 month.  Medications used in past cessation efforts include: varenicline   Rates IMPORTANCE of quitting tobacco on 1-10 scale of 10. Rates CONFIDENCE of quitting tobacco on 1-10 scale of 10.  Most common triggers to use tobacco include; Stress, after meals  Motivation to quit: Health / Breathing  O: Review of Systems  Respiratory:  Positive for cough and sputum production.   All other systems reviewed and are negative.  Physical Exam Constitutional:      Appearance: Normal appearance.  Pulmonary:     Effort: Pulmonary  effort is normal.  Neurological:     Mental Status: She is alert. Mental status is at baseline.  Psychiatric:        Mood and Affect: Mood normal.        Behavior: Behavior normal.        Thought Content: Thought content normal.        Judgment: Judgment normal.   Patient is participating in a Managed Medicaid Plan:  Yes   A/P: Tobacco use disorder with mild nicotine  dependence of ~ 30 years duration in a patient who is good candidate for success because of current level of motivation and recent progress with intake reduction.    -Reinitiated varenicline  0.5 mg by mouth once daily with food x7 days, then 0.5 mg by mouth twice daily with food thereafter. Patient counseled on purpose, proper use, and potential adverse effects, including GI upset. - Goal is to decrease intake further to 3 or less moving forward.  -Provided encouragement to contact 1 800-QUIT NOW support program.   Hypertension - poorly controlled today, possibly related to increased stressors due to recent move back from [place], [place] to [place].  She is interested in adjustment in her therapy due to elevated readings despite adherence with current regimen.  - Changed amlodipoine/olmesartan  to triple therapy from amlodipine /olmesartan /hydrochlorothiazide  - Continued metoprolol  XL 50mg  daily  Asthma fair control however requests restart of controller Symbicort  and nebulized albuterol  due to concern for worsening control with cold weather.  - reordered both controller symbicort  and rescue albuterol  nebulizer.  Written patient instructions provided. Patient verbalized understanding of treatment plan.  Total time in face to face counseling 28 minutes.    Follow-up:  Pharmacist 05/14/2023 PCP clinic visit PRN Patient seen with Madelaine Darnel, PharmD Candidate.

## 2023-04-16 NOTE — Addendum Note (Signed)
 Addended by: Kathrin Ruddy on: 04/16/2023 09:35 PM   Modules accepted: Orders

## 2023-04-17 ENCOUNTER — Encounter: Payer: Self-pay | Admitting: Pharmacist

## 2023-04-18 NOTE — Progress Notes (Signed)
 Reviewed and agree with Dr Macky Lower plan.

## 2023-04-22 ENCOUNTER — Encounter: Payer: Self-pay | Admitting: Dermatology

## 2023-04-22 ENCOUNTER — Ambulatory Visit (INDEPENDENT_AMBULATORY_CARE_PROVIDER_SITE_OTHER): Payer: Medicaid Other | Admitting: Dermatology

## 2023-04-22 VITALS — BP 150/99 | HR 61

## 2023-04-22 DIAGNOSIS — L309 Dermatitis, unspecified: Secondary | ICD-10-CM

## 2023-04-22 DIAGNOSIS — L219 Seborrheic dermatitis, unspecified: Secondary | ICD-10-CM

## 2023-04-22 DIAGNOSIS — L814 Other melanin hyperpigmentation: Secondary | ICD-10-CM | POA: Diagnosis not present

## 2023-04-22 DIAGNOSIS — R21 Rash and other nonspecific skin eruption: Secondary | ICD-10-CM | POA: Diagnosis not present

## 2023-04-22 MED ORDER — PIMECROLIMUS 1 % EX CREA: TOPICAL_CREAM | Freq: Every day | CUTANEOUS | 0 refills | Status: DC

## 2023-04-22 NOTE — Progress Notes (Signed)
   New Patient Visit   Subjective  Bailey Hooper is a 58 y.o. female who presents for the following: New Pt - Rash  Patient states she has rash located at the face & chest that she would like to have examined. Patient reports the areas have been there for 6 months. She reports the areas are bothersome.Patient rates irritation (itching & burning) 6 out of 10. She states that the areas have not spread. Patient reports she has not previously been treated for these areas by derm but she was Rx by her PCP Triamcinolone  that she used for a week but it did not provide much relief in Sx. Patient denies Hx of bx. Patient denies family history of skin cancer(s).  The patient has spots, moles and lesions to be evaluated, some may be new or changing and the patient may have concern these could be cancer.   The following portions of the chart were reviewed this encounter and updated as appropriate: medications, allergies, medical history  Review of Systems:  No other skin or systemic complaints except as noted in HPI or Assessment and Plan.  Objective  Well appearing patient in no apparent distress; mood and affect are within normal limits.   A focused examination was performed of the following areas: chest & face   Relevant exam findings are noted in the Assessment and Plan.         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 The condition is unusually pruritic, with itching severity rated 6-7/10, particularly in the morning during bathing. Previous treatments with cortisone and triamcinolone  provided partial relief. Potential causes include sun exposure or irritation from scented products, though the patient denies use of perfume or sunscreen. A  photoallergic reaction is also considered.  Plan: Discontinue steroid use to prevent worsening of blood vessel appearance. Prescribe Elidel  cream (pimecrolimus ) mixed with Acerovir anti-itch lotion (pramoxine) to be applied morning and night for 2 months. Apply CeraVe Anti-Itch lotion after showering. Follow-up appointment in 2 months. If insurance doesn't cover pimecrolimus , consider tacrolimus  ointment as an alternative.  Eczema Assessment: Patient has eczema patches on the chest and face. The chest patch appears to be chronic eczema, while facial eczema is noted separately. Menopause-related changes in skin chemistry may be contributing to increased susceptibility to skin issues.  Plan: For facial eczema: Apply pimecrolimus  daily until resolved (avoid triamcinolone  on face). Apply pimecrolimus  to chest after CeraVe Anti-Itch application.  Solar Lentigo Assessment: Patient has a brown macule on the left malar cheek consistent with solar lentigo. Plan: Reassurance  Seborrheic Dermatitis Assessment: Patient presents with peeling consistent with seborrheic dermatitis, potentially triggered by weather changes and stress. Plan: No specific treatment plan discussed for this condition.     No follow-ups on file.    Documentation: I have reviewed the above documentation for accuracy and completeness, and I agree with the above.   I, Shirron Maranda, CMA, am acting as scribe for [employer], DO.   Delon Lenis, DO

## 2023-04-22 NOTE — Patient Instructions (Signed)

## 2023-04-23 ENCOUNTER — Ambulatory Visit (HOSPITAL_COMMUNITY): Payer: Medicaid Other | Admitting: Clinical

## 2023-04-23 DIAGNOSIS — F3132 Bipolar disorder, current episode depressed, moderate: Secondary | ICD-10-CM | POA: Diagnosis not present

## 2023-04-23 DIAGNOSIS — F411 Generalized anxiety disorder: Secondary | ICD-10-CM

## 2023-04-23 NOTE — Progress Notes (Signed)
 Virtual Visit via Telephone Note( video attempted first pt audio did not work)  I connected with Bailey Hooper on 04/23/23 at  3:00 PM EST by telephone and verified that I am speaking with the correct person using two identifiers.  Location: Patient: home Provider: office   I discussed the limitations, risks, security and privacy concerns of performing an evaluation and management service by telephone and the availability of in person appointments. I also discussed with the patient that there may be a patient responsible charge related to this service. The patient expressed understanding and agreed to proceed.    THERAPIST PROGRESS NOTE   Session Time: 3:00 PM-3:30 PM   Participation Level: Active   Behavioral Response: CasualAlertAnxious   Type of Therapy: Individual Therapy   Treatment Goals addressed: Coping   Interventions: CBT   Summary: Bailey Hooper is a 58 y.o. female who presents with Bipolar Disorder./ GAD. The OPT therapist worked with the patient for her scheduled OPT session. The OPT therapist utilized Motivational Interviewing to assist in creating therapeutic repore. The patient in the session was engaged and work in collaboration giving feedback about her triggers and symptoms over the past few weeks. The patient spoke about change since her last session in getting a place in [place] and has been in the process of moving from [place], [place] to [place].The OPT therapist utilized Cognitive Behavioral Therapy through cognitive restructuring as well as worked with the patient on coping strategies to assist in management of mood and as she continues to work on family interactions,  finances, physical and mental health. The patient spoke about frustration with her relationship with her daughter and wanting to reconcile from prior conflict. The patient spoke about considering reaching out to her daughter and reconciling to be able to reconnect with her daughter and grandchildren.  The patient spoke about looking for in the local community in her new living environment getting connected with a local senior center and hosting a bingo night. The patient spoke about the amenities and perks of living in a new apartment housing community.   Suicidal/Homicidal: Nowithout intent/plan   Therapist Response: The OPT therapist worked with the patient for the patients scheduled session. The patient was engaged in her session and gave feedback in relation to triggers, symptoms, and behavior responses over the past few weeks. The OPT therapist worked with the patient utilizing an in session Cognitive Behavioral Therapy exercise. The patient was responsive in the session and verbalized,  I got a place to live in a apartment complex in [place] and I have been working on moving from [place] to [place]. The OPT therapist worked with the patient on managing her own individual health and being mindful of her own basic care needs including eating, sleeping, exercise, and hygenie. The patient identified her need to focus on herself and realization she cannot work outside of what is in her control. The patient spoke about learning she has build up around her heart and she may be getting referred to a Cardiologist. The patient spoke about working to not overwhelm herself and noted even though she is in [place] and back around family working to not get herself overwhelmed by committing to family request.The OPT therapist will continue treatment work with the patient in her next scheduled session.   Plan: Return again in 3 weeks.   Diagnosis:      Axis I: Bipolar Disorder/ GAD  Axis II: No diagnosis   Collaboration of Care: No additional collaboration of care for this session.    Patient/Guardian was advised Release of Information must be obtained prior to any record release in order to collaborate their care with an outside provider. Patient/Guardian was advised if they  have not already done so to contact the registration department to sign all necessary forms in order for us  to release information regarding their care.    Consent: Patient/Guardian gives verbal consent for treatment and assignment of benefits for services provided during this visit. Patient/Guardian expressed understanding and agreed to proceed      I discussed the assessment and treatment plan with the patient. The patient was provided an opportunity to ask questions and all were answered. The patient agreed with the plan and demonstrated an understanding of the instructions.   The patient was advised to call back or seek an in-person evaluation if the symptoms worsen or if the condition fails to improve as anticipated.   I provided 30 minutes of non-face-to-face time during this encounter.   Jerel ONEIDA Pepper, LCSW  04/23/2023

## 2023-04-25 ENCOUNTER — Other Ambulatory Visit: Payer: Self-pay | Admitting: Dermatology

## 2023-04-25 MED ORDER — TACROLIMUS 0.1 % EX OINT: TOPICAL_OINTMENT | Freq: Two times a day (BID) | CUTANEOUS | 0 refills | Status: DC

## 2023-04-25 NOTE — Progress Notes (Signed)
Insurance wouldn't cover [employer]

## 2023-05-01 ENCOUNTER — Other Ambulatory Visit: Payer: Self-pay

## 2023-05-01 ENCOUNTER — Telehealth: Payer: Self-pay | Admitting: Dermatology

## 2023-05-01 DIAGNOSIS — R21 Rash and other nonspecific skin eruption: Secondary | ICD-10-CM

## 2023-05-01 MED ORDER — ELIDEL 1 % EX CREA: TOPICAL_CREAM | Freq: Every day | CUTANEOUS | 0 refills | Status: AC

## 2023-05-01 NOTE — Telephone Encounter (Signed)
Patient called in stating that she is still awaiting a call back from the clinic in regards to her prescription that she is unable to pick up , stated that the pharmacy said that a prior authorization is needed before she can receive it.   Patient is requesting a call back .

## 2023-05-08 ENCOUNTER — Ambulatory Visit: Payer: Medicaid Other | Attending: Family Medicine | Admitting: Occupational Therapy

## 2023-05-09 ENCOUNTER — Ambulatory Visit: Payer: Medicaid Other | Admitting: Occupational Therapy

## 2023-05-09 NOTE — Therapy (Deleted)
 OUTPATIENT OCCUPATIONAL THERAPY ORTHO EVALUATION  Patient Name: Bailey Hooper MRN: [id number] DOB:1965/11/19, 58 y.o., female Today's Date: 05/09/2023  PCP: Westley Chandler, MD REFERRING PROVIDER: Westley Chandler, MD  END OF SESSION:   Past Medical History:  Diagnosis Date   Allergy    Anxiety    Arthritis    Asthma    Depression    Fibroids    GERD (gastroesophageal reflux disease)    Hiatal hernia    High cholesterol    Hypertension    IBS (irritable bowel syndrome)    Migraines    Non-alcoholic fatty liver disease    Prediabetes    Sleep apnea    cpap   Tobacco use    Vertigo    Past Surgical History:  Procedure Laterality Date   CESAREAN SECTION     COLONOSCOPY     FOOT SURGERY     Left foot   Patient Active Problem List   Diagnosis Date Noted   Family history of dementia 08/16/2021   NAFLD (nonalcoholic fatty liver disease) 40/98/1191   H/O colonoscopy 06/16/2020   Bipolar disorder (HCC) 01/18/2019   Asthma 01/18/2019   Hyperlipidemia 07/14/2018   Tobacco abuse 11/29/2017   Benign paroxysmal positional vertigo 11/29/2017   Major depressive disorder, recurrent episode, moderate (HCC) 06/28/2009   GENITAL HERPES 01/17/2009   Migraine headache 01/17/2009   Essential hypertension, benign 01/17/2009   GERD (gastroesophageal reflux disease) 01/17/2009   Irritable bowel syndrome 01/17/2009   Heart murmur 01/17/2009    ONSET DATE: 03/12/2023 (Date of referral)  REFERRING DIAG: G56.00 (ICD-10-CM) - Carpal tunnel syndrome, unspecified laterality  THERAPY DIAG:  No diagnosis found.  Rationale for Evaluation and Treatment: Rehabilitation  SUBJECTIVE:   SUBJECTIVE STATEMENT: *** Pt accompanied by: {accompnied:27141}  PERTINENT HISTORY: ***  PRECAUTIONS: {Therapy precautions:24002}  RED FLAGS: {PT Red Flags:29287}   WEIGHT BEARING RESTRICTIONS: {Yes ***/No:24003}  PAIN:  Are you having pain? {OPRCPAIN:27236}  FALLS: Has patient fallen in  last 6 months? {fallsyesno:27318}  LIVING ENVIRONMENT: Lives with: {OPRC lives with:25569::"lives with their family"} Lives in: {Lives in:25570} Stairs: {opstairs:27293} Has following equipment at home: {Assistive devices:23999}  PLOF: {PLOF:24004}  PATIENT GOALS: ***  NEXT MD VISIT: ***  OBJECTIVE:  Note: Objective measures were completed at Evaluation unless otherwise noted.  HAND DOMINANCE: {MISC; OT HAND [phone]}  ADLs: {ADLs OT:31716}  FUNCTIONAL OUTCOME MEASURES: {OTFUNCTIONALMEASURES:27238}  UPPER EXTREMITY ROM:     {AROM/PROM:27142} ROM Right eval Left eval  Shoulder flexion    Shoulder abduction    Shoulder adduction    Shoulder extension    Shoulder internal rotation    Shoulder external rotation    Elbow flexion    Elbow extension    Wrist flexion    Wrist extension    Wrist ulnar deviation    Wrist radial deviation    Wrist pronation    Wrist supination    (Blank rows = not tested)  {AROM/PROM:27142} ROM Right eval Left eval  Thumb MCP (0-60)    Thumb IP (0-80)    Thumb Radial abd/add (0-55)     Thumb Palmar abd/add (0-45)     Thumb Opposition to Small Finger     Index MCP (0-90)     Index PIP (0-100)     Index DIP (0-70)      Long MCP (0-90)      Long PIP (0-100)      Long DIP (0-70)      Ring MCP (0-90)  Ring PIP (0-100)      Ring DIP (0-70)      Little MCP (0-90)      Little PIP (0-100)      Little DIP (0-70)      (Blank rows = not tested)   UPPER EXTREMITY MMT:     MMT Right eval Left eval  Shoulder flexion    Shoulder abduction    Shoulder adduction    Shoulder extension    Shoulder internal rotation    Shoulder external rotation    Middle trapezius    Lower trapezius    Elbow flexion    Elbow extension    Wrist flexion    Wrist extension    Wrist ulnar deviation    Wrist radial deviation    Wrist pronation    Wrist supination    (Blank rows = not tested)  HAND  FUNCTION: {handfunction:27230}  COORDINATION: {otcoordination:27237}  SENSATION: {sensation:27233}  EDEMA: ***  COGNITION: Overall cognitive status: {cognition:24006} Areas of impairment: {impairedcognition:27234}  OBSERVATIONS: ***   TREATMENT DATE: ***                                                                                                                            Modalities: {OPRCMODALITIES:31717}     PATIENT EDUCATION: Education details: *** Person educated: {Person educated:25204} Education method: {Education Method:25205} Education comprehension: {Education Comprehension:25206}  HOME EXERCISE PROGRAM: ***  GOALS: Goals reviewed with patient? {yes/no:20286}  SHORT TERM GOALS: Target date: ***  *** Baseline: Goal status: INITIAL  2.  *** Baseline:  Goal status: INITIAL  3.  *** Baseline:  Goal status: INITIAL  4.  *** Baseline:  Goal status: INITIAL  5.  *** Baseline:  Goal status: INITIAL  6.  *** Baseline:  Goal status: INITIAL  LONG TERM GOALS: Target date: ***  *** Baseline:  Goal status: INITIAL  2.  *** Baseline:  Goal status: INITIAL  3.  *** Baseline:  Goal status: INITIAL  4.  *** Baseline:  Goal status: INITIAL  5.  *** Baseline:  Goal status: INITIAL  6.  *** Baseline:  Goal status: INITIAL  ASSESSMENT:  CLINICAL IMPRESSION: Patient is a *** y.o. *** who was seen today for occupational therapy evaluation for ***.   PERFORMANCE DEFICITS: in functional skills including {OT physical skills:25468}, cognitive skills including {OT cognitive skills:25469}, and psychosocial skills including {OT psychosocial skills:25470}.   IMPAIRMENTS: are limiting patient from {OT performance deficits:25471}.   COMORBIDITIES: {Comorbidities:25485} that affects occupational performance. Patient will benefit from skilled OT to address above impairments and improve overall function.  MODIFICATION OR ASSISTANCE TO  COMPLETE EVALUATION: {OT modification:25474}  OT OCCUPATIONAL PROFILE AND HISTORY: {OT PROFILE AND HISTORY:25484}  CLINICAL DECISION MAKING: {OT CDM:25475}  REHAB POTENTIAL: {rehabpotential:25112}  EVALUATION COMPLEXITY: {Evaluation complexity:25115}      PLAN:  OT FREQUENCY: {rehab frequency:25116}  OT DURATION: {rehab duration:25117}  PLANNED INTERVENTIONS: {OT Interventions:25467}  RECOMMENDED OTHER SERVICES: ***  CONSULTED AND AGREED WITH PLAN  OF CARE: {CBJ:62831}  PLAN FOR NEXT SESSION: Delana Meyer, OT 05/09/2023, 9:26 AM

## 2023-05-14 ENCOUNTER — Ambulatory Visit (INDEPENDENT_AMBULATORY_CARE_PROVIDER_SITE_OTHER): Payer: Medicaid Other | Admitting: Pharmacist

## 2023-05-14 ENCOUNTER — Encounter: Payer: Self-pay | Admitting: Pharmacist

## 2023-05-14 ENCOUNTER — Other Ambulatory Visit: Payer: Self-pay

## 2023-05-14 ENCOUNTER — Ambulatory Visit: Payer: Medicaid Other | Attending: Family Medicine | Admitting: Occupational Therapy

## 2023-05-14 ENCOUNTER — Encounter: Payer: Self-pay | Admitting: Occupational Therapy

## 2023-05-14 VITALS — BP 155/85 | HR 60 | Wt 184.2 lb

## 2023-05-14 DIAGNOSIS — M79642 Pain in left hand: Secondary | ICD-10-CM | POA: Insufficient documentation

## 2023-05-14 DIAGNOSIS — M79641 Pain in right hand: Secondary | ICD-10-CM | POA: Insufficient documentation

## 2023-05-14 DIAGNOSIS — R208 Other disturbances of skin sensation: Secondary | ICD-10-CM | POA: Insufficient documentation

## 2023-05-14 DIAGNOSIS — I10 Essential (primary) hypertension: Secondary | ICD-10-CM | POA: Diagnosis not present

## 2023-05-14 DIAGNOSIS — G5600 Carpal tunnel syndrome, unspecified upper limb: Secondary | ICD-10-CM | POA: Diagnosis not present

## 2023-05-14 DIAGNOSIS — M6281 Muscle weakness (generalized): Secondary | ICD-10-CM | POA: Diagnosis not present

## 2023-05-14 DIAGNOSIS — R29818 Other symptoms and signs involving the nervous system: Secondary | ICD-10-CM | POA: Diagnosis not present

## 2023-05-14 DIAGNOSIS — Z720 Tobacco use: Secondary | ICD-10-CM | POA: Diagnosis not present

## 2023-05-14 DIAGNOSIS — R29898 Other symptoms and signs involving the musculoskeletal system: Secondary | ICD-10-CM | POA: Insufficient documentation

## 2023-05-14 MED ORDER — EPLERENONE 25 MG PO TABS: 25.0000 mg | ORAL_TABLET | Freq: Every day | ORAL | 1 refills | Status: DC

## 2023-05-14 NOTE — Progress Notes (Signed)
 S:     Chief Complaint  Patient presents with   Medication Management    Tobacco Intake reduction and HTN   58 y.o. female who presents for hypertension evaluation, education, and management.  PMH is significant for asthma (in early adulthood), bronchitis.   Patient was referred by and last seen by Primary Care Provider, Dr. Delores, on 02/27/2023.   At last visit, discussed tobacco dependence. She was not yet ready to set quit date, Chantix  (varenicline ) was re-prescribed and patient reports taking.   Currently smokes 3-5 cigarettes/day commonly after meals.  Previously ~ 10 cigarettes/day.  HTN: Checks blood pressures at home, typically sees 130s-140s/80s.   Patient reports breathing has been poor. Gets short of breath while at rest.  Uses Albuterol  PRN.    Medication adherence is good, adherent to all medications.   Age when started using tobacco on a daily basis 26-27  (1994). Brand smoked [place]. Number of cigarettes/day 2-4.  Estimated nicotine  content per cigarette (mg) 1.  Estimated nicotine  intake per day 2-4mg .   Denies waking to smoke at night.     Longest time ever been tobacco free  3-6 month.   Medications used in past cessation efforts include: varenicline    Rates IMPORTANCE of quitting tobacco on 1-10 scale of 10. Rates CONFIDENCE of quitting tobacco on 1-10 scale of 10.   Most common triggers to use tobacco include; Stress, after meals   Motivation to quit: Health / Breathing  Current antihypertensives include: metoprolol  succinate (Toprol -XL) 50 mg daily, olmesartan -amlodipine -hydrochlorothiazide  40-10-25 mg   O:  Review of Systems  Gastrointestinal:  Positive for heartburn (states she feels gassy).    Physical Exam Vitals reviewed.  Constitutional:      Appearance: Normal appearance.  Pulmonary:     Effort: Pulmonary effort is normal.  Neurological:     Mental Status: She is alert.  Psychiatric:        Mood and Affect: Mood normal.         Behavior: Behavior normal.        Thought Content: Thought content normal.        Judgment: Judgment normal.     Last 3 Office BP readings: BP Readings from Last 3 Encounters:  05/14/23 (!) 155/85  04/22/23 (!) 150/99  04/16/23 (!) 171/107    BMET    Component Value Date/Time   NA 143 03/05/2023 1516   K 4.2 03/05/2023 1516   CL 103 03/05/2023 1516   CO2 28 03/05/2023 1516   GLUCOSE 72 03/05/2023 1516   GLUCOSE 119 (H) 06/11/2016 1637   BUN 12 03/05/2023 1516   CREATININE 0.79 03/05/2023 1516   CREATININE 0.71 06/11/2016 1637   CALCIUM  9.4 03/05/2023 1516   GFRNONAA 100 01/08/2020 1007   GFRNONAA >89 06/11/2016 1637   GFRAA 115 01/08/2020 1007   GFRAA >89 06/11/2016 1637    Renal function: CrCl cannot be calculated (Patient's most recent lab result is older than the maximum 21 days allowed.).  Clinical ASCVD:  The 10-year ASCVD risk score (Arnett DK, et al., 2019) is: 17.4%   Values used to calculate the score:     Age: 89 years     Sex: Female     Is Non-Hispanic African American: Yes     Diabetic: No     Tobacco smoker: Yes     Systolic Blood Pressure: 155 mmHg     Is BP treated: Yes     HDL Cholesterol: 50 mg/dL  Total Cholesterol: 161 mg/dL  Patient is participating in a Managed Medicaid Plan:  Yes    A/P: Hypertension diagnosed currently poorly controlled on current medications. BP goal < 130/80 mmHg. Medication adherence appears good. Control is suboptimal due to BP above goal.  -Continued Toprol -XL 50 mg daily and olmesartan -amlodipine -hydrochlorothiazide  40-10-25 mg. -Added Eplerenone  25 mg daily. Patient educated on purpose, proper use, and potential adverse effects.  -F/u labs ordered - BMET in 7 - 10 days -Counseled on lifestyle modifications for blood pressure control including reduced dietary sodium, increased exercise, adequate sleep. -Encouraged patient to check BP at home and bring log of readings to next visit. Counseled on proper use  of home BP cuff.   Chronic tobacco use disorder.  Continues to smoke ~ 3-5 cigarettes per day.  After meals is a key trigger.  Discussed goal to avoid smoking for > 30 minutes after lunch.  - Continue varenicline  0.5mg  BID   Results reviewed and written information provided.    Written patient instructions provided. Patient verbalized understanding of treatment plan.  Total time in face to face counseling 43 minutes.    Follow-up:  Pharmacist TBD. PCP clinic visit 06/04/23.  Patient seen with Owens Cowing, PharmD Candidate and Doyal Goods, PharmD Candidate.

## 2023-05-14 NOTE — Therapy (Signed)
 OUTPATIENT OCCUPATIONAL THERAPY ORTHO EVALUATION  Patient Name: Bailey Hooper MRN: [id number] DOB:Apr 08, 1966, 58 y.o., female Today's Date: 05/14/2023  PCP: Delores Suzann HERO, MD REFERRING PROVIDER: Delores Suzann HERO, MD  END OF SESSION:  OT End of Session - 05/14/23 1534     Visit Number 1    Number of Visits 13    Date for OT Re-Evaluation 06/28/23    Authorization Type [place] Medicaid - auth req after 12 visits    OT Start Time 1534    OT Stop Time 1613    OT Time Calculation (min) 39 min    Activity Tolerance Patient tolerated treatment well    Behavior During Therapy WFL for tasks assessed/performed             Past Medical History:  Diagnosis Date   Allergy    Anxiety    Arthritis    Asthma    Depression    Fibroids    GERD (gastroesophageal reflux disease)    Hiatal hernia    High cholesterol    Hypertension    IBS (irritable bowel syndrome)    Migraines    Non-alcoholic fatty liver disease    Prediabetes    Sleep apnea    cpap   Tobacco use    Vertigo    Past Surgical History:  Procedure Laterality Date   CESAREAN SECTION     COLONOSCOPY     FOOT SURGERY     Left foot   Patient Active Problem List   Diagnosis Date Noted   Family history of dementia 08/16/2021   NAFLD (nonalcoholic fatty liver disease) 95/70/7977   H/O colonoscopy 06/16/2020   Bipolar disorder (HCC) 01/18/2019   Asthma 01/18/2019   Hyperlipidemia 07/14/2018   Tobacco abuse 11/29/2017   Benign paroxysmal positional vertigo 11/29/2017   Major depressive disorder, recurrent episode, moderate (HCC) 06/28/2009   GENITAL HERPES 01/17/2009   Migraine headache 01/17/2009   Essential hypertension, benign 01/17/2009   GERD (gastroesophageal reflux disease) 01/17/2009   Irritable bowel syndrome 01/17/2009   Heart murmur 01/17/2009    ONSET DATE: 03/12/2023 (Date of referral)  REFERRING DIAG: G56.00 (ICD-10-CM) - Carpal tunnel syndrome, unspecified laterality  THERAPY DIAG:  Muscle  weakness (generalized)  Other symptoms and signs involving the nervous system  Other symptoms and signs involving the musculoskeletal system  Other disturbances of skin sensation  Pain in right hand  Pain in left hand  Rationale for Evaluation and Treatment: Rehabilitation  SUBJECTIVE:   SUBJECTIVE STATEMENT: Trigger fingers to B middle fingers and thumbs. She says she saw Dr. Romona in 2022. She does not want to have surgery. She has braces but quit wearing them because she still had pain. She reports dropping things from her hands all the time. Has been told she has arthritis.   Pt accompanied by: self  PERTINENT HISTORY: PMH: anxiety, arthritis, asthma, depression, HTN, fibroids, IBS, migraines, prediabetes, sleep apnea, tobacco use, HLD, and bipolar  PRECAUTIONS: None  WEIGHT BEARING RESTRICTIONS: No  PAIN:  Are you having pain? Yes: NPRS scale: 8/10 Pain location: Right hand, arm, and into elbow Pain description: sharp/tingling in fingers Aggravating factors: use Relieving factors: rest; alternates ice and heat; Voltaren   FALLS: Has patient fallen in last 6 months? 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   PATIENT GOALS: Get some relief and get some sleep at night.   OBJECTIVE:  Note: Objective measures were completed at Evaluation unless otherwise noted.  HAND DOMINANCE: Right  ADLs: WFL  FUNCTIONAL OUTCOME MEASURES: Quick Dash: 61.4 % disability with BUEs   UPPER EXTREMITY ROM:     BUE: WNL (pain reported)  UPPER EXTREMITY MMT:    BUE: WFL  HAND FUNCTION: Grip strength: Right: 7 lbs (with pain increase); Left: 19 lbs  COORDINATION: 9 Hole Peg test: Right: 45 sec (cramping and pain reported  requiring rest break); Left: 28 sec  SENSATION: Reports paresthesias B  EDEMA: mild edema reported, not observed at time of eval  COGNITION: Overall cognitive status: Within functional limits for tasks assessed  OBSERVATIONS: Pt appears well-kept. Glasses donned. Ambulates I without AD. No LOB.   TREATMENT: N/A for this visit                                                                                                                            PATIENT EDUCATION: Education details: OT Role and POC Person educated: Patient Education method: Explanation Education comprehension: verbalized understanding  HOME EXERCISE PROGRAM: N/A for this visit  GOALS:  SHORT TERM GOALS: Target date: 06/11/2023    Patient will demonstrate initial  BUE HEP with 25% verbal cues or less for proper execution.  Baseline: Goal status: INITIAL  2.  Pt will independently recall at least 3 joint protection, ergonomics, and body mechanic principles as noted in pt instructions.   Baseline:  Goal status: INITIAL  3.  Pt will verbalize understanding of sleep positioning recommendations in efforts to improve nerve related pain.  Baseline:  Goal status: INITIAL   LONG TERM GOALS: Target date: 06/28/2023  Patient will demonstrate updated BUE HEP with visual handouts only for proper execution. Baseline:  Goal status: INITIAL  2.  Patient will demonstrate at least 16% improvement with quick Dash score (reporting 45.4% disability or less) indicating improved functional use of affected extremity. Baseline: 61.4 % disability with BUEs Goal status: INITIAL  3.  Patient will demo improved FM coordination as evidenced by completing nine-hole peg with use of R in 35 seconds or less without reported increase to pain.  Baseline: 45 sec (cramping and pain reported requiring rest break) Goal status: INITIAL  4.  Patient will demonstrate at least 20 lbs B grip strength as needed to open jars and other  containers.  Baseline: Right: 7 lbs (with pain increase); Left: 19 lbs Goal status: INITIAL  ASSESSMENT:  CLINICAL IMPRESSION: Patient is a 58 y.o. female who was seen today for occupational therapy evaluation for carpal tunnel. Hx includes anxiety, arthritis, asthma, depression, HTN, fibroids, IBS, migraines, prediabetes, sleep apnea, tobacco use, HLD, and bipolar. Patient currently presents below baseline level of functioning demonstrating functional deficits and impairments as noted below. Pt would benefit from skilled OT services in the outpatient setting to work  on impairments as noted below to help pt return to PLOF as able.   PERFORMANCE DEFICITS: in functional skills including ADLs, IADLs, coordination, sensation, ROM, strength, pain, Fine motor control, decreased knowledge of precautions, and UE functional use.   IMPAIRMENTS: are limiting patient from ADLs, IADLs, rest and sleep, work, and leisure.   COMORBIDITIES: may have co-morbidities  that affects occupational performance. Patient will benefit from skilled OT to address above impairments and improve overall function.  MODIFICATION OR ASSISTANCE TO COMPLETE EVALUATION: Min-Moderate modification of tasks or assist with assess necessary to complete an evaluation.  OT OCCUPATIONAL PROFILE AND HISTORY: Detailed assessment: Review of records and additional review of physical, cognitive, psychosocial history related to current functional performance.  CLINICAL DECISION MAKING: Moderate - several treatment options, min-mod task modification necessary  REHAB POTENTIAL: Good  EVALUATION COMPLEXITY: Moderate      PLAN:  OT FREQUENCY: 2x/week  OT DURATION: 6 weeks  PLANNED INTERVENTIONS: 97168 OT Re-evaluation, 97535 self care/ADL training, 02889 therapeutic exercise, 97530 therapeutic activity, 97112 neuromuscular re-education, 97140 manual therapy, 97035 ultrasound, 97018 paraffin, 02960 fluidotherapy, 97010 moist heat, 97034  contrast bath, 97032 electrical stimulation (manual), 97760 Orthotics management and training, 02239 Splinting (initial encounter), [id number] Subsequent splinting/medication, passive range of motion, patient/family education, and DME and/or AE instructions  RECOMMENDED OTHER SERVICES: N/A for this visit  CONSULTED AND AGREED WITH PLAN OF CARE: Patient  PLAN FOR NEXT SESSION: initiate tendon glides, modality use, joint protection   Jocelyn CHRISTELLA Bottom, OT 05/14/2023, 5:24 PM  For all possible CPT codes, reference the Planned Interventions line above.     Check all conditions that are expected to impact treatment: {Conditions expected to impact treatment:Psychological or psychiatric disorders and Social determinants of health   If treatment provided at initial evaluation, no treatment charged due to lack of authorization.

## 2023-05-14 NOTE — Patient Instructions (Signed)
 It was nice to see you today!  Your goal blood pressure is <130/80  mmHg  Medication Changes: START Eplerenone  25 mg daily    Continue all other medication the same.    Monitor blood pressure at home daily and keep a log (on your phone or piece of paper) to bring with you to your next visit. Write down date, time, blood pressure and pulse.  Keep up the good work with diet and exercise. Aim for a diet full of vegetables, fruit and lean meats (chicken, turkey, fish). Try to limit salt intake by eating fresh or frozen vegetables (instead of canned), rinse canned vegetables prior to cooking and do not add any additional salt to meals.    Please return in 7 - 10 days for BMET.

## 2023-05-14 NOTE — Assessment & Plan Note (Signed)
Chronic tobacco use disorder.  Continues to smoke ~ 3-5 cigarettes per day.  After meals is a key trigger.  Discussed goal to avoid smoking for > 30 minutes after lunch.  - Continue varenicline 0.5mg  BID

## 2023-05-14 NOTE — Assessment & Plan Note (Signed)
 Hypertension diagnosed currently poorly controlled on current medications. BP goal < 130/80 mmHg. Medication adherence appears good. Control is suboptimal due to BP above goal.  -Continued Toprol -XL 50 mg daily and olmesartan -amlodipine -hydrochlorothiazide  40-10-25 mg. -Added Eplerenone  25 mg daily. Patient educated on purpose, proper use, and potential adverse effects.  -F/u labs ordered - BMET in 7 - 10 days -Counseled on lifestyle modifications for blood pressure control including reduced dietary sodium, increased exercise, adequate sleep. -Encouraged patient to check BP at home and bring log of readings to next visit. Counseled on proper use of home BP cuff.

## 2023-05-15 NOTE — Progress Notes (Signed)
 Reviewed and agree with Dr Macky Lower plan.

## 2023-05-20 ENCOUNTER — Ambulatory Visit: Payer: Medicaid Other | Admitting: Occupational Therapy

## 2023-05-20 ENCOUNTER — Ambulatory Visit (INDEPENDENT_AMBULATORY_CARE_PROVIDER_SITE_OTHER): Payer: Medicaid Other | Admitting: Clinical

## 2023-05-20 DIAGNOSIS — F411 Generalized anxiety disorder: Secondary | ICD-10-CM

## 2023-05-20 DIAGNOSIS — F3132 Bipolar disorder, current episode depressed, moderate: Secondary | ICD-10-CM | POA: Diagnosis not present

## 2023-05-20 NOTE — Progress Notes (Signed)
 Virtual Visit via Telephone Note( video attempted first pt audio did not work)   I connected with Bailey Hooper on 05/20/23 at  4:15 PM EST by telephone and verified that I am speaking with the correct person using two identifiers.   Location: Patient: home Provider: office   I discussed the limitations, risks, security and privacy concerns of performing an evaluation and management service by telephone and the availability of in person appointments. I also discussed with the patient that there may be a patient responsible charge related to this service. The patient expressed understanding and agreed to proceed.     THERAPIST PROGRESS NOTE   Session Time: 4:15 PM-5:00 PM   Participation Level: Active   Behavioral Response: CasualAlertAnxious   Type of Therapy: Individual Therapy   Treatment Goals addressed: Coping   Interventions: CBT   Summary: Bailey Hooper is a 58 y.o. female who presents with Bipolar Disorder./ GAD. The OPT therapist worked with the patient for her scheduled OPT session. The OPT therapist utilized Motivational Interviewing to assist in creating therapeutic repore. The patient in the session was engaged and work in collaboration giving feedback about her triggers and symptoms over the past few weeks. The patient spoke about working to adjust post moving from [place], [place] to [place].The OPT therapist utilized Cognitive Behavioral Therapy through cognitive restructuring as well as worked with the patient on coping strategies to assist in management of mood and as she continues to work on family interactions,  finances, physical and mental health. The patient spoke about frustration with her relationship with her daughter and wanting to reconcile from prior conflict. The patient spoke about still not talking with daughter and reconciling to be able to reconnect with her daughter and grandchildren. The patient spoke about looking for in the local community in her new living  environment getting connected with a local senior center and hosting a bingo night. The patient spoke about the amenities and perks of living in a new apartment housing community. The patient spoke about working on her awareness of where her stress boundaries are and being aware of when she needs to implement coping to create balance in her life.    Suicidal/Homicidal: Nowithout intent/plan   Therapist Response: The OPT therapist worked with the patient for the patients scheduled session. The patient was engaged in her session and gave feedback in relation to triggers, symptoms, and behavior responses over the past few weeks. The OPT therapist worked with the patient utilizing an in session Cognitive Behavioral Therapy exercise. The patient was responsive in the session and verbalized, " I have been working on learning how much I can deal with and when I am having a good day and when I am having to much In one day and I need to take a break, and I am considering dating again , but I am focused on my issues and getting my mental health together". The OPT therapist worked with the patient on managing her own individual health and being mindful of her own basic care needs including eating, sleeping, exercise, and hygenie. The patient identified her need to focus on herself and realization she cannot work outside of what is in her control. The patient spoke about learning she has build up around her heart and she is waiting on upcoming Cardiologist. The patient spoke about working to not overwhelm herself and noted even though she is in [place] and back around family working to not get herself overwhelmed by committing to  family request.The OPT therapist will continue treatment work with the patient in her next scheduled session.   Plan: Return again in 3 weeks.   Diagnosis:      Axis I: Bipolar Disorder/ GAD                             Axis II: No diagnosis   Collaboration of Care: No additional  collaboration of care for this session.    Patient/Guardian was advised Release of Information must be obtained prior to any record release in order to collaborate their care with an outside provider. Patient/Guardian was advised if they have not already done so to contact the registration department to sign all necessary forms in order for us  to release information regarding their care.    Consent: Patient/Guardian gives verbal consent for treatment and assignment of benefits for services provided during this visit. Patient/Guardian expressed understanding and agreed to proceed      I discussed the assessment and treatment plan with the patient. The patient was provided an opportunity to ask questions and all were answered. The patient agreed with the plan and demonstrated an understanding of the instructions.   The patient was advised to call back or seek an in-person evaluation if the symptoms worsen or if the condition fails to improve as anticipated.   I provided 45 minutes of non-face-to-face time during this encounter.   Lea Primmer, LCSW   05/20/2023

## 2023-05-22 ENCOUNTER — Ambulatory Visit: Payer: Medicaid Other | Admitting: Occupational Therapy

## 2023-05-22 DIAGNOSIS — R208 Other disturbances of skin sensation: Secondary | ICD-10-CM | POA: Diagnosis not present

## 2023-05-22 DIAGNOSIS — M6281 Muscle weakness (generalized): Secondary | ICD-10-CM

## 2023-05-22 DIAGNOSIS — G5600 Carpal tunnel syndrome, unspecified upper limb: Secondary | ICD-10-CM | POA: Diagnosis not present

## 2023-05-22 DIAGNOSIS — R29818 Other symptoms and signs involving the nervous system: Secondary | ICD-10-CM

## 2023-05-22 DIAGNOSIS — M79641 Pain in right hand: Secondary | ICD-10-CM | POA: Diagnosis not present

## 2023-05-22 DIAGNOSIS — M79642 Pain in left hand: Secondary | ICD-10-CM | POA: Diagnosis not present

## 2023-05-22 DIAGNOSIS — R29898 Other symptoms and signs involving the musculoskeletal system: Secondary | ICD-10-CM | POA: Diagnosis not present

## 2023-05-22 NOTE — Patient Instructions (Addendum)
    Joint Protection  Respect pain/Develop pain awareness Monitor activities - stop to rest when discomfort or fatigue develops Awareness of pain for activities prior to 12-24 hrs Pain lingering 2 hrs after activity is a warning sign - modify or eliminate  Use larger joints and muscles Protect the delicate joints by using larger joints - use shoulder to carry bags not hands More proximal muscles and joints to perform ADL's (forearm/shoulder to open door-protect fingers) Groceries in paper bags Let the hip, elbow and arm support the grocery bag/push cart's weight Push up from seat with palms of hands, not back of fingers  Avoid tight/strong and prolonged grasp Build up handles (foam or cloth) on utensils, tools and pens Modify handles with levers Use AE for assistance for opening jars, pens and books, turning knobs and keys Change grasp to avoid static positioning Work with fingers extended over a large sponge rather than squeezing it Use open, flat palm to open jars, pump bottles for shampoo/soaps  Avoid carrying, lifting and using heavy objects Use a cart to move heavy objects Push heavy objects with hips instead of pulling with hands Distribute weight over many joints - use 2 hands to lift an item Use light weight tools and objects Use smaller containers of liquid or distribute liquids to smaller containers to lighten loads  Balance rest and activity Plan rest breaks ahead of time Avoid activities that cannot be stopped if needed Energy conservation techniques (sitting when able, planning ahead, organizing workspace and storage for accessibility, keeping most common used items within easy access, resting during activities, use timesavers like prepared foods) AE-Labor saving devices (modified cooking writing or cutting utensils, zipper pulls, button hooks, built up handle items, large grip pens, jar openers, key holder/turner, garden tools, and spring loaded scissors)  Use  splints as prescribes to protect joints

## 2023-05-22 NOTE — Therapy (Signed)
OUTPATIENT OCCUPATIONAL THERAPY ORTHO TREATMENT  Patient Name: Bailey Hooper MRN: [id number] DOB:[birth year], 58 y.o., female Today's Date: 05/22/2023  PCP: Westley Chandler, MD REFERRING PROVIDER: Westley Chandler, MD  END OF SESSION:  OT End of Session - 05/22/23 1316     Visit Number 2    Number of Visits 13    Date for OT Re-Evaluation 06/28/23    Authorization Type [place] Medicaid - auth req after 12 visits    OT Start Time 1315    OT Stop Time 1400    OT Time Calculation (min) 45 min    Activity Tolerance Patient tolerated treatment well    Behavior During Therapy WFL for tasks assessed/performed             Past Medical History:  Diagnosis Date   Allergy    Anxiety    Arthritis    Asthma    Depression    Fibroids    GERD (gastroesophageal reflux disease)    Hiatal hernia    High cholesterol    Hypertension    IBS (irritable bowel syndrome)    Migraines    Non-alcoholic fatty liver disease    Prediabetes    Sleep apnea    cpap   Tobacco use    Vertigo    Past Surgical History:  Procedure Laterality Date   CESAREAN SECTION     COLONOSCOPY     FOOT SURGERY     Left foot   Patient Active Problem List   Diagnosis Date Noted   Family history of dementia 08/16/2021   NAFLD (nonalcoholic fatty liver disease) 40/98/1191   H/O colonoscopy 06/16/2020   Bipolar disorder (HCC) 01/18/2019   Asthma 01/18/2019   Hyperlipidemia 07/14/2018   Tobacco abuse 11/29/2017   Benign paroxysmal positional vertigo 11/29/2017   Major depressive disorder, recurrent episode, moderate (HCC) 06/28/2009   GENITAL HERPES 01/17/2009   Migraine headache 01/17/2009   Essential hypertension, benign 01/17/2009   GERD (gastroesophageal reflux disease) 01/17/2009   Irritable bowel syndrome 01/17/2009   Heart murmur 01/17/2009    ONSET DATE: 03/12/2023 (Date of referral)  REFERRING DIAG: G56.00 (ICD-10-CM) - Carpal tunnel syndrome, unspecified laterality  THERAPY DIAG:  Pain in  right hand  Other symptoms and signs involving the nervous system  Other symptoms and signs involving the musculoskeletal system  Other disturbances of skin sensation  Muscle weakness (generalized)  Rationale for Evaluation and Treatment: Rehabilitation  SUBJECTIVE:   SUBJECTIVE STATEMENT: Trigger fingers to B middle fingers and thumbs. She says she saw Dr. Frazier Butt in 2022. She does not want to have surgery. She has braces but quit wearing them because she still had pain. She reports dropping things from her hands "all the time". Has been told she has arthritis.   Pt accompanied by: self  PERTINENT HISTORY: PMH: anxiety, arthritis, asthma, depression, HTN, fibroids, IBS, migraines, prediabetes, sleep apnea, tobacco use, HLD, and bipolar  PRECAUTIONS: None  WEIGHT BEARING RESTRICTIONS: No  PAIN:  Are you having pain? Yes: NPRS scale: 7/10 Pain location: Right hand, arm, and into elbow Pain description: sharp/tingling in fingers Aggravating factors: use Relieving factors: rest; alternates ice and heat; Voltaren  FALLS: Has patient fallen in last 6 months? 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   PATIENT GOALS: Get some relief and get some sleep at night.   OBJECTIVE:  Note: Objective measures were completed at Evaluation unless otherwise noted.  HAND DOMINANCE: Right  ADLs: WFL  FUNCTIONAL OUTCOME MEASURES: Quick Dash: 61.4 % disability with BUEs   UPPER EXTREMITY ROM:     BUE: WNL (pain reported)  UPPER EXTREMITY MMT:    BUE: WFL  HAND FUNCTION: Grip strength: Right: 7 lbs (with pain increase); Left: 19 lbs  COORDINATION: 9 Hole Peg test: Right: 45 sec (cramping and pain reported requiring rest break);  Left: 28 sec  SENSATION: Reports paresthesias B  EDEMA: mild edema reported, not observed at time of eval  COGNITION: Overall cognitive status: Within functional limits for tasks assessed  OBSERVATIONS: Pt appears well-kept. Glasses donned. Ambulates I without AD. No LOB.   TREATMENT:   Fluidotherapy x 10 minutes for Rt hand/wrist to address pain, swelling, and stiffness. No adverse reactions.    Pt issued tendon gliding exercises/handout with review of motions to isolate DIP, PIP and MCP joints for straight finger position, hook (DIP/PIP flexion), fist (DIP/PIP/MCP flexion), taco/duck (MCP flexion only) and flat fist (MCP and PIP flexion).   This was issued while in fluidotherapy and reviewed - then pt did 10 reps through sequence while in fluidotherapy. Pt return demo again outside of fluidotherapy to ensure accuracy   Also discussed positions to avoid during the day (leaning on elbows, etc) and sleep positioning - avoiding extreme elbow flexion as this causes hand to go numb. Pt provided handouts on elbow sleeve and cubital tunnel elbow brace. Pt does report numbness at small finger as well and ? Cubital tunnel syndrome. However, pt also reports pain along wrist extensor musculature near lateral epicondyle. Some symptoms see inconsistent.   Pt also issued joint protection strategies and reviewed. See pt instructions for details.                                                                          PATIENT EDUCATION: Education details: tendon gliding HEP, joint protection techniques, sleep positioning Person educated: Patient Education method: Explanation, Demonstration, Verbal cues, and Handouts Education comprehension: verbalized understanding, returned demonstration, verbal cues required, and needs further education  HOME EXERCISE PROGRAM: 05/22/23: tendon gliding HEP, joint protection techniques  GOALS:  SHORT TERM GOALS: Target date: 06/11/2023    Patient will demonstrate  initial  BUE HEP with 25% verbal cues or less for proper execution.  Baseline: Goal status: INITIAL  2.  Pt will independently recall at least 3 joint protection, ergonomics, and body mechanic principles as noted in pt instructions.   Baseline:  Goal status: IN PROGRESS  3.  Pt will verbalize understanding of sleep positioning recommendations in efforts to improve nerve related pain.  Baseline:  Goal status: IN PROGRESS   LONG TERM GOALS: Target date: 06/28/2023  Patient will demonstrate updated BUE HEP with visual handouts only for proper execution. Baseline:  Goal status: INITIAL  2.  Patient will demonstrate at least 16% improvement with quick Dash score (reporting 45.4% disability or less) indicating improved functional use of affected extremity. Baseline: 61.4 % disability with BUEs Goal status: INITIAL  3.  Patient will demo improved FM coordination  as evidenced by completing nine-hole peg with use of R in 35 seconds or less without reported increase to pain.  Baseline: 45 sec (cramping and pain reported requiring rest break) Goal status: INITIAL  4.  Patient will demonstrate at least 20 lbs B grip strength as needed to open jars and other containers.  Baseline: Right: 7 lbs (with pain increase); Left: 19 lbs Goal status: INITIAL  ASSESSMENT:  CLINICAL IMPRESSION: Patient was seen today for occupational therapy treatment for carpal tunnel. Hx includes anxiety, arthritis, asthma, depression, HTN, fibroids, IBS, migraines, prediabetes, sleep apnea, tobacco use, HLD, and bipolar. Pt responded well to fluidotherapy today. Patient currently presents below baseline level of functioning demonstrating functional deficits and impairments as noted below. Pt would benefit from skilled OT services in the outpatient setting to work on impairments as noted below to help pt return to PLOF as able.   PERFORMANCE DEFICITS: in functional skills including ADLs, IADLs, coordination, sensation,  ROM, strength, pain, Fine motor control, decreased knowledge of precautions, and UE functional use.   IMPAIRMENTS: are limiting patient from ADLs, IADLs, rest and sleep, work, and leisure.   COMORBIDITIES: may have co-morbidities  that affects occupational performance. Patient will benefit from skilled OT to address above impairments and improve overall function.  MODIFICATION OR ASSISTANCE TO COMPLETE EVALUATION: Min-Moderate modification of tasks or assist with assess necessary to complete an evaluation.  OT OCCUPATIONAL PROFILE AND HISTORY: Detailed assessment: Review of records and additional review of physical, cognitive, psychosocial history related to current functional performance.  CLINICAL DECISION MAKING: Moderate - several treatment options, min-mod task modification necessary  REHAB POTENTIAL: Good  EVALUATION COMPLEXITY: Moderate      PLAN:  OT FREQUENCY: 2x/week  OT DURATION: 6 weeks  PLANNED INTERVENTIONS: 97168 OT Re-evaluation, 97535 self care/ADL training, 16109 therapeutic exercise, 97530 therapeutic activity, 97112 neuromuscular re-education, 97140 manual therapy, 97035 ultrasound, 97018 paraffin, 60454 fluidotherapy, 97010 moist heat, 97034 contrast bath, 97032 electrical stimulation (manual), 97760 Orthotics management and training, 09811 Splinting (initial encounter), [id number] Subsequent splinting/medication, passive range of motion, patient/family education, and DME and/or AE instructions  RECOMMENDED OTHER SERVICES: N/A for this visit  CONSULTED AND AGREED WITH PLAN OF CARE: Patient  PLAN FOR NEXT SESSION: continue fluido, issue median n. Gliding (proximal and distal) and ulnar n. Gliding HEP, continue to reinforce proper positioning, ergonomics and task modifications   Sheran Lawless, OT 05/22/2023, 1:17 PM  For all possible CPT codes, reference the Planned Interventions line above.     Check all conditions that are expected to impact treatment:  {Conditions expected to impact treatment:Psychological or psychiatric disorders and Social determinants of health   If treatment provided at initial evaluation, no treatment charged due to lack of authorization.

## 2023-05-23 ENCOUNTER — Ambulatory Visit (INDEPENDENT_AMBULATORY_CARE_PROVIDER_SITE_OTHER): Payer: Medicaid Other | Admitting: Podiatry

## 2023-05-23 ENCOUNTER — Encounter: Payer: Self-pay | Admitting: Podiatry

## 2023-05-23 DIAGNOSIS — M2142 Flat foot [pes planus] (acquired), left foot: Secondary | ICD-10-CM | POA: Diagnosis not present

## 2023-05-23 DIAGNOSIS — M79675 Pain in left toe(s): Secondary | ICD-10-CM

## 2023-05-23 DIAGNOSIS — M2141 Flat foot [pes planus] (acquired), right foot: Secondary | ICD-10-CM | POA: Diagnosis not present

## 2023-05-23 DIAGNOSIS — B351 Tinea unguium: Secondary | ICD-10-CM

## 2023-05-23 DIAGNOSIS — M79674 Pain in right toe(s): Secondary | ICD-10-CM

## 2023-05-23 NOTE — Progress Notes (Signed)
  Subjective:  Patient ID: Bailey Hooper, female    DOB: [birth year],  MRN: [id number]  Chief Complaint  Patient presents with   RFC    RM#11 RFC/CALLUSES- patient states no concerns at this time other then routine care.        58 year old female, with a history of foot surgery, presents with a painful callus on the foot that has been causing discomfort as well as for thick, elongated nails that she is not able to trim herself.  Denies any open lesions or any recent changes.  She has no new concerns today.    Objective:    Physical Exam         General: AAO x3, NAD  Dermatological: Hyperkeratotic lesion noted along the navicular tuberosity of the left foot, submetatarsal 2 left right hallux.  There is no underlying ulceration, drainage or any signs of infection.  There is no evidence of foreign body.  Nails are hypertrophic, dystrophic, brittle, discolored, elongated 10. No surrounding redness or drainage. Tenderness nails 1-5 bilaterally.  Vascular: Dorsalis Pedis artery and Posterior Tibial artery pedal pulses are 2/4 bilateral with immedate capillary fill time.  There is no pain with calf compression, swelling, warmth, erythema.   Neruologic: Grossly intact via light touch bilateral.   Musculoskeletal: Flatfoot is present.  Prominent metatarsal head plantarly.  Nonsignificant tuberosity bilaterally but slightly resulting in the skin lesion, rubbing.  No area pinpoint tenderness.  Gait: Unassisted, Nonantalgic.         Assessment:   Pes planovalgus; symptomatic onychomycosis  Plan:  Pes planovalgus -Continue shoes, good arch support.  I do think that some of her symptoms from the calluses are coming from biomechanical changes.  Continue moisturizer.  As a courtesy I debrided the calluses and complications of bleeding  Symptomatic onychomycosis -Sharply debrided nails x 10 and complications of bleeding.  She has previously tried topical antifungal medicine.  Discussed urea  nail gel  No follow-ups on file.  Vivi Barrack DPM

## 2023-05-27 ENCOUNTER — Ambulatory Visit: Payer: Medicaid Other | Admitting: Occupational Therapy

## 2023-05-27 DIAGNOSIS — G5600 Carpal tunnel syndrome, unspecified upper limb: Secondary | ICD-10-CM | POA: Diagnosis not present

## 2023-05-27 DIAGNOSIS — R29898 Other symptoms and signs involving the musculoskeletal system: Secondary | ICD-10-CM | POA: Diagnosis not present

## 2023-05-27 DIAGNOSIS — R208 Other disturbances of skin sensation: Secondary | ICD-10-CM | POA: Diagnosis not present

## 2023-05-27 DIAGNOSIS — M79642 Pain in left hand: Secondary | ICD-10-CM

## 2023-05-27 DIAGNOSIS — R29818 Other symptoms and signs involving the nervous system: Secondary | ICD-10-CM

## 2023-05-27 DIAGNOSIS — M79641 Pain in right hand: Secondary | ICD-10-CM | POA: Diagnosis not present

## 2023-05-27 DIAGNOSIS — M6281 Muscle weakness (generalized): Secondary | ICD-10-CM | POA: Diagnosis not present

## 2023-05-27 NOTE — Patient Instructions (Addendum)
   You can place your arms out to the sides with pillows underneath or place a pillow across your stomach with your hands resting on top for support. Hold a pillow with your arms away from your body.  Do not bend your arms at the elbows or tuck your hands under your head. Do not place your hand on top or underneath pillow. Use outside of pillow as a border.

## 2023-05-27 NOTE — Therapy (Unsigned)
OUTPATIENT OCCUPATIONAL THERAPY ORTHO TREATMENT  Patient Name: Bailey Hooper MRN: [id number] DOB:Jul 11, 1965, 58 y.o., female Today's Date: 05/27/2023  PCP: Westley Chandler, MD REFERRING PROVIDER: Westley Chandler, MD  END OF SESSION:  OT End of Session - 05/27/23 1247     Visit Number 3    Number of Visits 13    Date for OT Re-Evaluation 06/28/23    Authorization Type [place] Medicaid - auth req after 12 visits    OT Start Time 1235    OT Stop Time 1314    OT Time Calculation (min) 39 min    Activity Tolerance Patient tolerated treatment well    Behavior During Therapy WFL for tasks assessed/performed            Past Medical History:  Diagnosis Date   Allergy    Anxiety    Arthritis    Asthma    Depression    Fibroids    GERD (gastroesophageal reflux disease)    Hiatal hernia    High cholesterol    Hypertension    IBS (irritable bowel syndrome)    Migraines    Non-alcoholic fatty liver disease    Prediabetes    Sleep apnea    cpap   Tobacco use    Vertigo    Past Surgical History:  Procedure Laterality Date   CESAREAN SECTION     COLONOSCOPY     FOOT SURGERY     Left foot   Patient Active Problem List   Diagnosis Date Noted   Family history of dementia 08/16/2021   NAFLD (nonalcoholic fatty liver disease) 40/01/2724   H/O colonoscopy 06/16/2020   Bipolar disorder (HCC) 01/18/2019   Asthma 01/18/2019   Hyperlipidemia 07/14/2018   Tobacco abuse 11/29/2017   Benign paroxysmal positional vertigo 11/29/2017   Major depressive disorder, recurrent episode, moderate (HCC) 06/28/2009   GENITAL HERPES 01/17/2009   Migraine headache 01/17/2009   Essential hypertension, benign 01/17/2009   GERD (gastroesophageal reflux disease) 01/17/2009   Irritable bowel syndrome 01/17/2009   Heart murmur 01/17/2009    ONSET DATE: 03/12/2023 (Date of referral)  REFERRING DIAG: G56.00 (ICD-10-CM) - Carpal tunnel syndrome, unspecified laterality  THERAPY DIAG:  Pain in  right hand  Other symptoms and signs involving the nervous system  Other symptoms and signs involving the musculoskeletal system  Other disturbances of skin sensation  Muscle weakness (generalized)  Pain in left hand  Rationale for Evaluation and Treatment: Rehabilitation  SUBJECTIVE:   SUBJECTIVE STATEMENT: Pt has been wearing braces B at night and to R throughout the day as needed.   Pt accompanied by: self  PERTINENT HISTORY: PMH: anxiety, arthritis, asthma, depression, HTN, fibroids, IBS, migraines, prediabetes, sleep apnea, tobacco use, HLD, and bipolar  PRECAUTIONS: None  WEIGHT BEARING RESTRICTIONS: No  PAIN:  Are you having pain? Yes: NPRS scale: 8.5/10 Pain location: Right hand, arm, and into elbow Pain description: sharp/tingling in fingers Aggravating factors: use Relieving factors: rest; alternates ice and heat; Voltaren  Worst pain in last 24 hours: 8.5/10   FALLS: Has patient fallen in last 6 months? 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   PATIENT GOALS: Get some relief and get some sleep at night.   OBJECTIVE:  Note: Objective measures  were completed at Evaluation unless otherwise noted.  HAND DOMINANCE: Right  ADLs: WFL  FUNCTIONAL OUTCOME MEASURES: Quick Dash: 61.4 % disability with BUEs   UPPER EXTREMITY ROM:     BUE: WNL (pain reported)  UPPER EXTREMITY MMT:    BUE: WFL  HAND FUNCTION: Grip strength: Right: 7 lbs (with pain increase); Left: 19 lbs  COORDINATION: 9 Hole Peg test: Right: 45 sec (cramping and pain reported requiring rest break); Left: 28 sec  SENSATION: Reports paresthesias B  EDEMA: mild edema reported, not observed at time of eval  COGNITION: Overall  cognitive status: Within functional limits for tasks assessed  OBSERVATIONS: Pt appears well-kept. Glasses donned. Ambulates I without AD. No LOB.   TREATMENT:  OT reviewed tendon gliding exercises for improved ROM and pain. Pt able to demonstrate with min cueing for proper completion.   Fluidotherapy x 10 minutes for Rt hand/wrist to address pain, swelling, and stiffness. No adverse reactions. B hand placement in machine and pt completing tendon glides through duration.   OT educated patient on sleep positioning as noted in patient instructions to reduce stress to upper extremity nerves, which could be attributing to reported paresthesias and pain in affected extremity. Patient verbalized understanding. Handout provided. Additionally, pt was educated on how to apply ACE bandage to R elbow at night to help promote more neutral positioning.   OT educated pt on joint protection, ergonomics, and Optometrist principles as noted in pt instructions as needed to improve UE pain.                                                                          PATIENT EDUCATION: Education details: tendon gliding HEP, joint protection techniques, sleep positioning Person educated: Patient Education method: Explanation, Demonstration, Verbal cues, and Handouts Education comprehension: verbalized understanding, returned demonstration, verbal cues required, and needs further education  HOME EXERCISE PROGRAM: 05/22/23: tendon gliding HEP, joint protection techniques 05/27/2023: additional sleep positioning/joint protection techniques  GOALS:  SHORT TERM GOALS: Target date: 06/11/2023    Patient will demonstrate initial  BUE HEP with 25% verbal cues or less for proper execution.  Baseline: Goal status: INITIAL  2.  Pt will independently recall at least 3 joint protection, ergonomics, and body mechanic principles as noted in pt instructions.   Baseline:  Goal status: IN PROGRESS  3.  Pt will verbalize  understanding of sleep positioning recommendations in efforts to improve nerve related pain.  Baseline:  Goal status: IN PROGRESS   LONG TERM GOALS: Target date: 06/28/2023  Patient will demonstrate updated BUE HEP with visual handouts only for proper execution. Baseline:  Goal status: INITIAL  2.  Patient will demonstrate at least 16% improvement with quick Dash score (reporting 45.4% disability or less) indicating improved functional use of affected extremity. Baseline: 61.4 % disability with BUEs Goal status: INITIAL  3.  Patient will demo improved FM coordination as evidenced by completing nine-hole peg with use of R in 35 seconds or less without reported increase to pain.  Baseline: 45 sec (cramping and pain reported requiring rest break) Goal status: INITIAL  4.  Patient will demonstrate at least 20 lbs B grip strength as needed to open jars and other containers.  Baseline:  Right: 7 lbs (with pain increase); Left: 19 lbs Goal status: INITIAL  ASSESSMENT:  CLINICAL IMPRESSION: Patient verbalizes good understanding of pain reduction strategies as needed to progress towards goals.  PERFORMANCE DEFICITS: in functional skills including ADLs, IADLs, coordination, sensation, ROM, strength, pain, Fine motor control, decreased knowledge of precautions, and UE functional use.   IMPAIRMENTS: are limiting patient from ADLs, IADLs, rest and sleep, work, and leisure.   COMORBIDITIES: may have co-morbidities  that affects occupational performance. Patient will benefit from skilled OT to address above impairments and improve overall function.  REHAB POTENTIAL: Good  PLAN:  OT FREQUENCY: 2x/week  OT DURATION: 6 weeks  PLANNED INTERVENTIONS: 97168 OT Re-evaluation, 97535 self care/ADL training, 21308 therapeutic exercise, 97530 therapeutic activity, 97112 neuromuscular re-education, 97140 manual therapy, 97035 ultrasound, 97018 paraffin, 65784 fluidotherapy, 97010 moist heat, 97034  contrast bath, 97032 electrical stimulation (manual), 97760 Orthotics management and training, 69629 Splinting (initial encounter), [phone] Subsequent splinting/medication, passive range of motion, patient/family education, and DME and/or AE instructions  RECOMMENDED OTHER SERVICES: N/A for this visit  CONSULTED AND AGREED WITH PLAN OF CARE: Patient  PLAN FOR NEXT SESSION: continue fluido, issue median n. Gliding (proximal and distal) and ulnar n. Gliding HEP, continue to reinforce proper positioning, ergonomics and task modifications   Delana Meyer, OT 05/27/2023, 3:52 PM  For all possible CPT codes, reference the Planned Interventions line above.     Check all conditions that are expected to impact treatment: {Conditions expected to impact treatment:Psychological or psychiatric disorders and Social determinants of health   If treatment provided at initial evaluation, no treatment charged due to lack of authorization.

## 2023-05-29 ENCOUNTER — Ambulatory Visit: Payer: Medicaid Other | Admitting: Occupational Therapy

## 2023-06-03 ENCOUNTER — Ambulatory Visit: Payer: Medicaid Other | Admitting: Occupational Therapy

## 2023-06-03 NOTE — Progress Notes (Unsigned)
 SUBJECTIVE:   CHIEF COMPLAINT: Discuss referrals HPI:   Bailey Hooper is a 58 y.o.  with history notable for mood disorder, HTN presenting for follow up.  She reports she is now off her Seroquel.  She feels as though it is affecting her weight and her mood.  She reports that work is stressing her and her right hand symptoms are really bothering her.  Regards the patient's blood pressure she reports she is only taking her triple combination pill and metoprolol.  She does have a family history of hypertension.  She is not taking her eplerenone.  She is gained about 10 pounds in 2 years.  She is working on weight loss but has struggled with this due to outside stressors.  She is also working on tobacco cessation but finds this makes her anxiety better.  The patient reports several year history of right hand symptoms.  She is a previous diagnosis of possible carpal tunnel and tendinitis cared for by hand surgery.  Recently her symptoms have changed.  She is having numbness and tingling in her right hand.  She finds when she moves her neck a certain way this worsens it.  She does notice some right hand weakness.  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  She denies bowel or bladder symptoms lower extremity weakness or numbness.  She is is taking what sounds like oral diclofenac for this pain which we discussed could affect her blood pressure. She is seeing physical and Occupational Therapy for her hand symptoms.  PERTINENT  PMH / PSH/Family/Social History : I reviewed her CAT scan with her at length today.  OBJECTIVE:   BP (!) 156/94   Pulse 70   Ht 5\' 6"  (1.676 m)   Wt 185 lb 12.8 oz (84.3 kg)   LMP 02/12/2017 Comment: not sexually active  SpO2 100%   BMI 29.99 kg/m   Today's weight:  Last Weight  Most recent update: 06/04/2023  8:53 AM    Weight  84.3 kg (185 lb 12.8 oz)            Review of prior weights: [employer]   06/04/23 0851  Weight: 185 lb 12.8  oz (84.3 kg)   HEENT exam is notable for some mildly impacted cerumen.  Mucous membranes are moist.  Along her cervical spine she has tenderness along the left and right trapezius.  She has good range of motion of the right shoulder.  She has reduced grip strength in the right hand as compared to left similar to prior.  Positive Spurling's on right.  She has 5 out of 5 shoulder abduction and and forward flexion, bilaterally.  She has preserved biceps and triceps strength bilaterally as well.  RRR Lungs are clear   ASSESSMENT/PLAN:   Assessment & Plan Essential hypertension, benign Given risk factors will refer to cardiology.  We appreciate their consult and care in  controlling this patient's blood pressure.  Renin aldosterone level today.  She is going to pick up her eplerenone and follow-up in a few weeks to check her potassium.  We discussed that she should bring all of her medications with her.  She is going to schedule with our pharmacist to review and also go over smoking cessation. Mixed hyperlipidemia She is due for lipid panel in May.  She is already on a high intensity statin.  We discussed tobacco cessation to reduce the risk of progression of her aortic atherosclerosis. Tobacco abuse Discussed  the length.  We discussed her diagnosis of emphysema and how this is not reversible but can stop progression if she stops smoking.  She is very interested in smoking cessation and will follow-up to discuss additional options.  She has tried multiple things in the past without success. Bipolar affective disorder, remission status unspecified (HCC) She will discuss with her psychiatrist about getting on an alternative medication to Seroquel. NAFLD (nonalcoholic fatty liver disease) CMP today Neck pain on right side Suspect she may have C-spine disease as the cause of her intermittent numbness and tingling in sharp pains down her right arm.  I also considered a cardiac cause but this is much less  likely given her symptomatology.  Will obtain a C-spine x-ray Flexeril prescribed for pain.  She is no longer taking Seroquel.  She has tolerated this previously and had no signs of serotonergic excess. Declined PCV20, flu and covid.   Terisa Starr, MD  Family Medicine Teaching Service  [facility]

## 2023-06-04 ENCOUNTER — Ambulatory Visit: Payer: Medicaid Other | Admitting: Family Medicine

## 2023-06-04 ENCOUNTER — Ambulatory Visit
Admission: RE | Admit: 2023-06-04 | Discharge: 2023-06-04 | Disposition: A | Discharge status: Home / Self Care | Payer: Medicaid Other | Source: Ambulatory Visit | Attending: Family Medicine | Admitting: Family Medicine

## 2023-06-04 ENCOUNTER — Encounter: Payer: Self-pay | Admitting: Family Medicine

## 2023-06-04 VITALS — BP 156/94 | HR 70 | Ht 66.0 in | Wt 185.8 lb

## 2023-06-04 DIAGNOSIS — I10 Essential (primary) hypertension: Secondary | ICD-10-CM

## 2023-06-04 DIAGNOSIS — F319 Bipolar disorder, unspecified: Secondary | ICD-10-CM

## 2023-06-04 DIAGNOSIS — Z720 Tobacco use: Secondary | ICD-10-CM | POA: Diagnosis not present

## 2023-06-04 DIAGNOSIS — E782 Mixed hyperlipidemia: Secondary | ICD-10-CM

## 2023-06-04 DIAGNOSIS — M542 Cervicalgia: Secondary | ICD-10-CM

## 2023-06-04 DIAGNOSIS — K760 Fatty (change of) liver, not elsewhere classified: Secondary | ICD-10-CM

## 2023-06-04 DIAGNOSIS — F331 Major depressive disorder, recurrent, moderate: Secondary | ICD-10-CM

## 2023-06-04 MED ORDER — CYCLOBENZAPRINE HCL 10 MG PO TABS: 10.0000 mg | ORAL_TABLET | Freq: Three times a day (TID) | ORAL | 0 refills | Status: AC | PRN

## 2023-06-04 NOTE — Assessment & Plan Note (Signed)
 CMP today

## 2023-06-04 NOTE — Assessment & Plan Note (Signed)
 Discussed the length.  We discussed her diagnosis of emphysema and how this is not reversible but can stop progression if she stops smoking.  She is very interested in smoking cessation and will follow-up to discuss additional options.  She has tried multiple things in the past without success.

## 2023-06-04 NOTE — Assessment & Plan Note (Signed)
 Given risk factors will refer to cardiology.  We appreciate their consult and care in  controlling this patient's blood pressure.  Renin aldosterone level today.  She is going to pick up her eplerenone and follow-up in a few weeks to check her potassium.  We discussed that she should bring all of her medications with her.  She is going to schedule with our pharmacist to review and also go over smoking cessation.

## 2023-06-04 NOTE — Assessment & Plan Note (Signed)
 She will discuss with her psychiatrist about getting on an alternative medication to Seroquel.

## 2023-06-04 NOTE — Patient Instructions (Signed)
 It was wonderful to see you today.  Please bring ALL of your medications with you to every visit.   Today we talked about:  - I have referred you to Cardiology to further evaluate your concern. If you do not received a phone call about this appointment within 3-4 weeks, please call our office back at [phone]. Clemencia Course coordinates our referrals and can assist you in this.     Right arm/shoulder/neck  I think this may be your neck  I sent in flexeril   An x-ray was ordered for you---you do not need an appointment to have this completed.  I recommend going to [facility] [address] [place]  If the results are normal,I will send you a letter  I will call you with results if anything is abnormal    BRING ALL OF YOUR MEDICATIONS TO SEE DR. KOVAL  YOU ARE DOING GREAT  Here is your CT read   IMPRESSION: 1. Lung-RADS 1, negative. Continue annual screening with low-dose chest CT without contrast in 12 months. 2. Age advanced coronary artery calcification. 3.  Aortic atherosclerosis (ICD10-I70.0). 4.  Emphysema (ICD10-J43.9).     Please follow up in 1 months   Thank you for choosing [facility].   Please call [phone] with any questions about today's appointment.  Please be sure to schedule follow up at the front  desk before you leave today.   Terisa Starr, MD  Family Medicine

## 2023-06-04 NOTE — Assessment & Plan Note (Signed)
 She is due for lipid panel in May.  She is already on a high intensity statin.  We discussed tobacco cessation to reduce the risk of progression of her aortic atherosclerosis.

## 2023-06-05 ENCOUNTER — Ambulatory Visit: Payer: Medicaid Other | Admitting: Occupational Therapy

## 2023-06-05 DIAGNOSIS — M6281 Muscle weakness (generalized): Secondary | ICD-10-CM

## 2023-06-05 DIAGNOSIS — M79641 Pain in right hand: Secondary | ICD-10-CM

## 2023-06-05 DIAGNOSIS — R29898 Other symptoms and signs involving the musculoskeletal system: Secondary | ICD-10-CM | POA: Diagnosis not present

## 2023-06-05 DIAGNOSIS — R208 Other disturbances of skin sensation: Secondary | ICD-10-CM

## 2023-06-05 DIAGNOSIS — G5600 Carpal tunnel syndrome, unspecified upper limb: Secondary | ICD-10-CM | POA: Diagnosis not present

## 2023-06-05 DIAGNOSIS — M79642 Pain in left hand: Secondary | ICD-10-CM | POA: Diagnosis not present

## 2023-06-05 DIAGNOSIS — R29818 Other symptoms and signs involving the nervous system: Secondary | ICD-10-CM | POA: Diagnosis not present

## 2023-06-05 NOTE — Therapy (Signed)
 OUTPATIENT OCCUPATIONAL THERAPY ORTHO TREATMENT  Patient Name: Bailey Hooper MRN: [id number] DOB:December 27, 1965, 58 y.o., female Today's Date: 06/05/2023  PCP: Westley Chandler, MD REFERRING PROVIDER: Westley Chandler, MD  END OF SESSION:  OT End of Session - 06/05/23 1238     Visit Number 4    Number of Visits 13    Date for OT Re-Evaluation 06/28/23    Authorization Type [place] [employer] - auth req after 12 visits    OT Start Time 1236    OT Stop Time 1315    OT Time Calculation (min) 39 min    Activity Tolerance Patient tolerated treatment well    Behavior During Therapy WFL for tasks assessed/performed            Past Medical History:  Diagnosis Date   Allergy    Anxiety    Arthritis    Asthma    Depression    Fibroids    GERD (gastroesophageal reflux disease)    Hiatal hernia    High cholesterol    Hypertension    IBS (irritable bowel syndrome)    Migraines    Non-alcoholic fatty liver disease    Prediabetes    Sleep apnea    cpap   Tobacco use    Vertigo    Past Surgical History:  Procedure Laterality Date   CESAREAN SECTION     COLONOSCOPY     FOOT SURGERY     Left foot   Patient Active Problem List   Diagnosis Date Noted   Family history of dementia 08/16/2021   NAFLD (nonalcoholic fatty liver disease) 19/14/7829   H/O colonoscopy 06/16/2020   Bipolar disorder (HCC) 01/18/2019   Asthma 01/18/2019   Hyperlipidemia 07/14/2018   Tobacco abuse 11/29/2017   Benign paroxysmal positional vertigo 11/29/2017   Major depressive disorder, recurrent episode, moderate (HCC) 06/28/2009   GENITAL HERPES 01/17/2009   Migraine headache 01/17/2009   Essential hypertension, benign 01/17/2009   GERD (gastroesophageal reflux disease) 01/17/2009   Irritable bowel syndrome 01/17/2009   Heart murmur 01/17/2009    ONSET DATE: 03/12/2023 (Date of referral)  REFERRING DIAG: G56.00 (ICD-10-CM) - Carpal tunnel syndrome, unspecified laterality  THERAPY DIAG:  Pain in  right hand  Other symptoms and signs involving the nervous system  Other symptoms and signs involving the musculoskeletal system  Other disturbances of skin sensation  Muscle weakness (generalized)  Pain in left hand  Rationale for Evaluation and Treatment: Rehabilitation  SUBJECTIVE:   SUBJECTIVE STATEMENT: Pt reports she is being referred to orthopedic surgeon as her PCP thinks she some of her RUE symptoms are stemming from her neck.   Pt accompanied by: self  PERTINENT HISTORY: PMH: anxiety, arthritis, asthma, depression, HTN, fibroids, IBS, migraines, prediabetes, sleep apnea, tobacco use, HLD, and bipolar  PRECAUTIONS: None  WEIGHT BEARING RESTRICTIONS: No  PAIN:  Are you having pain? Yes: NPRS scale: 8.5/10 Pain location: Right hand, arm, and into elbow Pain description: sharp/tingling in fingers Aggravating factors: use Relieving factors: rest; alternates ice and heat; Voltaren  Worst pain in last 24 hours: 8.5/10   FALLS: Has patient fallen in last 6 months? 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   PATIENT GOALS: Get some relief and get some sleep at  night.   OBJECTIVE:  Note: Objective measures were completed at Evaluation unless otherwise noted.  HAND DOMINANCE: Right  ADLs: WFL  FUNCTIONAL OUTCOME MEASURES: Quick Dash: 61.4 % disability with BUEs   UPPER EXTREMITY ROM:     BUE: WNL (pain reported)  UPPER EXTREMITY MMT:    BUE: WFL  HAND FUNCTION: Grip strength: Right: 7 lbs (with pain increase); Left: 19 lbs  COORDINATION: 9 Hole Peg test: Right: 45 sec (cramping and pain reported requiring rest break); Left: 28 sec  SENSATION: Reports paresthesias B  EDEMA: mild edema reported, not  observed at time of eval  COGNITION: Overall cognitive status: Within functional limits for tasks assessed  OBSERVATIONS: Pt appears well-kept. Glasses donned. Ambulates I without AD. No LOB.   TREATMENT:  OT initiated median nerve glide exercises for improved ROM and pain. Pt able to demonstrate with min cueing for proper completion.   Fluidotherapy x 10 minutes for Rt hand/wrist to address pain, swelling, and stiffness. No adverse reactions. B hand placement in machine and pt completing tendon glides through duration.   OT educated patient on sleep positioning as noted in patient instructions to reduce stress to upper extremity nerves, which could be attributing to reported paresthesias and pain in affected extremity. Patient verbalized understanding. Handout provided. Additionally, pt was educated on how to apply ACE bandage to R elbow at night to help promote more neutral positioning.   OT reviewed joint protection, ergonomics, sleep positioning, and body mechanic principles as noted in pt instructions as needed to improve UE pain. Pt required mod cueing for proper recall.                                                                         PATIENT EDUCATION: Education details: median nerve gliding HEP, joint protection techniques, sleep positioning Person educated: Patient Education method: Explanation, Demonstration, Verbal cues, and Handouts Education comprehension: verbalized understanding, returned demonstration, verbal cues required, and needs further education  HOME EXERCISE PROGRAM: 05/22/23: tendon gliding HEP, joint protection techniques 05/27/2023: additional sleep positioning/joint protection techniques 06/05/2023: median nerve glides  GOALS:  SHORT TERM GOALS: Target date: 06/11/2023    Patient will demonstrate initial  BUE HEP with 25% verbal cues or less for proper execution.  Baseline: Goal status: INITIAL  2.  Pt will independently recall at least 3 joint  protection, ergonomics, and body mechanic principles as noted in pt instructions.   Baseline:  Goal status: IN PROGRESS  3.  Pt will verbalize understanding of sleep positioning recommendations in efforts to improve nerve related pain.  Baseline:  Goal status: IN PROGRESS   LONG TERM GOALS: Target date: 06/28/2023  Patient will demonstrate updated BUE HEP with visual handouts only for proper execution. Baseline:  Goal status: INITIAL  2.  Patient will demonstrate at least 16% improvement with quick Dash score (reporting 45.4% disability or less) indicating improved functional use of affected extremity. Baseline: 61.4 % disability with BUEs Goal status: INITIAL  3.  Patient will demo improved FM coordination as evidenced by completing nine-hole peg with use of R in 35 seconds or less without reported increase to pain.  Baseline: 45 sec (cramping and pain reported requiring rest break) Goal status: INITIAL  4.  Patient will demonstrate at least 20 lbs B grip strength as needed to open jars and other containers.  Baseline: Right: 7 lbs (with pain increase); Left: 19 lbs Goal status: INITIAL  ASSESSMENT:  CLINICAL IMPRESSION: Patient demonstrates good understanding of median nerve glides; however, she would benefit from further review of positioning due to poor recall.  PERFORMANCE DEFICITS: in functional skills including ADLs, IADLs, coordination, sensation, ROM, strength, pain, Fine motor control, decreased knowledge of precautions, and UE functional use.   IMPAIRMENTS: are limiting patient from ADLs, IADLs, rest and sleep, work, and leisure.   COMORBIDITIES: may have co-morbidities  that affects occupational performance. Patient will benefit from skilled OT to address above impairments and improve overall function.  REHAB POTENTIAL: Good  PLAN:  OT FREQUENCY: 2x/week  OT DURATION: 6 weeks  PLANNED INTERVENTIONS: 97168 OT Re-evaluation, 97535 self care/ADL training, 16109  therapeutic exercise, 97530 therapeutic activity, 97112 neuromuscular re-education, 97140 manual therapy, 97035 ultrasound, 97018 paraffin, 60454 fluidotherapy, 97010 moist heat, 97034 contrast bath, 97032 electrical stimulation (manual), 97760 Orthotics management and training, 09811 Splinting (initial encounter), [id number] Subsequent splinting/medication, passive range of motion, patient/family education, and DME and/or AE instructions  RECOMMENDED OTHER SERVICES: N/A for this visit  CONSULTED AND AGREED WITH PLAN OF CARE: Patient  PLAN FOR NEXT SESSION: continue fluido, review median n. Gliding (proximal and distal) and initiate ulnar n. Gliding HEP, continue to reinforce proper positioning, ergonomics and task modifications to progress toward recall goal   Delana Meyer, OT 06/05/2023, 1:11 PM  For all possible CPT codes, reference the Planned Interventions line above.     Check all conditions that are expected to impact treatment: {Conditions expected to impact treatment:Psychological or psychiatric disorders and Social determinants of health   If treatment provided at initial evaluation, no treatment charged due to lack of authorization.      j

## 2023-06-07 DIAGNOSIS — K219 Gastro-esophageal reflux disease without esophagitis: Secondary | ICD-10-CM | POA: Diagnosis not present

## 2023-06-07 DIAGNOSIS — K2930 Chronic superficial gastritis without bleeding: Secondary | ICD-10-CM | POA: Diagnosis not present

## 2023-06-07 DIAGNOSIS — K31A0 Gastric intestinal metaplasia, unspecified: Secondary | ICD-10-CM | POA: Diagnosis not present

## 2023-06-07 DIAGNOSIS — K760 Fatty (change of) liver, not elsewhere classified: Secondary | ICD-10-CM | POA: Diagnosis not present

## 2023-06-07 DIAGNOSIS — Z800 Family history of malignant neoplasm of digestive organs: Secondary | ICD-10-CM | POA: Diagnosis not present

## 2023-06-07 DIAGNOSIS — R1013 Epigastric pain: Secondary | ICD-10-CM | POA: Diagnosis not present

## 2023-06-07 DIAGNOSIS — R748 Abnormal levels of other serum enzymes: Secondary | ICD-10-CM | POA: Diagnosis not present

## 2023-06-07 DIAGNOSIS — Z860101 Personal history of adenomatous and serrated colon polyps: Secondary | ICD-10-CM | POA: Diagnosis not present

## 2023-06-07 DIAGNOSIS — R1114 Bilious vomiting: Secondary | ICD-10-CM | POA: Diagnosis not present

## 2023-06-07 DIAGNOSIS — K5790 Diverticulosis of intestine, part unspecified, without perforation or abscess without bleeding: Secondary | ICD-10-CM | POA: Diagnosis not present

## 2023-06-07 DIAGNOSIS — K222 Esophageal obstruction: Secondary | ICD-10-CM | POA: Diagnosis not present

## 2023-06-07 DIAGNOSIS — K582 Mixed irritable bowel syndrome: Secondary | ICD-10-CM | POA: Diagnosis not present

## 2023-06-09 LAB — BASIC METABOLIC PANEL
BUN/Creatinine Ratio: 18 (ref 9–23)
BUN: 14 mg/dL (ref 6–24)
CO2: 24 mmol/L (ref 20–29)
Calcium: 9.5 mg/dL (ref 8.7–10.2)
Chloride: 101 mmol/L (ref 96–106)
Creatinine, Ser: 0.8 mg/dL (ref 0.57–1.00)
Glucose: 108 mg/dL — ABNORMAL HIGH (ref 70–99)
Potassium: 4.3 mmol/L (ref 3.5–5.2)
Sodium: 143 mmol/L (ref 134–144)
eGFR: 86 mL/min/{1.73_m2} (ref >59)

## 2023-06-09 LAB — HEPATIC FUNCTION PANEL
ALT: 16 IU/L (ref 0–32)
AST: 12 IU/L (ref 0–40)
Albumin: 4.3 g/dL (ref 3.8–4.9)
Alkaline Phosphatase: 124 IU/L — ABNORMAL HIGH (ref 44–121)
Bilirubin Total: 0.3 mg/dL (ref 0.0–1.2)
Bilirubin, Direct: 0.11 mg/dL (ref 0.00–0.40)
Total Protein: 7.2 g/dL (ref 6.0–8.5)

## 2023-06-09 LAB — ALDOSTERONE + RENIN ACTIVITY W/ RATIO
Aldos/Renin Ratio: 32.4 — ABNORMAL HIGH (ref 0.0–30.0)
Aldosterone: 9.4 ng/dL (ref 0.0–30.0)
Renin Activity, Plasma: 0.29 ng/mL/h (ref 0.167–5.380)

## 2023-06-10 ENCOUNTER — Ambulatory Visit: Payer: Self-pay | Admitting: Occupational Therapy

## 2023-06-10 ENCOUNTER — Telehealth: Payer: Self-pay | Admitting: Family Medicine

## 2023-06-10 ENCOUNTER — Encounter: Payer: Self-pay | Admitting: Occupational Therapy

## 2023-06-10 DIAGNOSIS — M542 Cervicalgia: Secondary | ICD-10-CM

## 2023-06-10 NOTE — Telephone Encounter (Signed)
 Called with results Discussed C-spine disease. Referral to spine surgery. Increase tylenol to 2 g per day, Flexeril half-tab, can increase gabapentin if worsening  Discussed elevated ALP and PRA. Given uncontrolled HTN, referred to Cardiology. Likely needs 24 hour test and possibly adrenal imaging, defer to Cardiology given patient on 5 agents.  Terisa Starr, MD  Family Medicine Teaching Service

## 2023-06-11 NOTE — Therapy (Signed)
 OT d/c completed per pt request. Will require new referral should further skilled OT services be needed.

## 2023-06-12 ENCOUNTER — Encounter: Payer: Medicaid Other | Admitting: Occupational Therapy

## 2023-06-13 ENCOUNTER — Encounter: Payer: Self-pay | Admitting: Family Medicine

## 2023-06-13 DIAGNOSIS — M5412 Radiculopathy, cervical region: Secondary | ICD-10-CM | POA: Diagnosis not present

## 2023-06-17 ENCOUNTER — Ambulatory Visit (INDEPENDENT_AMBULATORY_CARE_PROVIDER_SITE_OTHER): Payer: Medicaid Other | Admitting: Clinical

## 2023-06-17 DIAGNOSIS — F319 Bipolar disorder, unspecified: Secondary | ICD-10-CM

## 2023-06-17 DIAGNOSIS — F3132 Bipolar disorder, current episode depressed, moderate: Secondary | ICD-10-CM

## 2023-06-17 DIAGNOSIS — F411 Generalized anxiety disorder: Secondary | ICD-10-CM | POA: Diagnosis not present

## 2023-06-17 NOTE — Progress Notes (Signed)
 Virtual Visit via Video Note  I connected with Bailey Hooper on 06/17/23 at  4:00 PM EDT by a video enabled telemedicine application and verified that I am speaking with the correct person using two identifiers.  Location: Patient: home Provider: office   I discussed the limitations of evaluation and management by telemedicine and the availability of in person appointments. The patient expressed understanding and agreed to proceed.  THERAPIST PROGRESS NOTE   Session Time: 4:00 PM-4:45 PM   Participation Level: Active   Behavioral Response: CasualAlertAnxious   Type of Therapy: Individual Therapy   Treatment Goals addressed: Coping   Interventions: CBT   Summary: Bailey Hooper is a 58 y.o. female who presents with Bipolar Disorder./ GAD. The OPT therapist worked with the patient for her scheduled OPT session. The OPT therapist utilized Motivational Interviewing to assist in creating therapeutic repore. The patient in the session was engaged and work in collaboration giving feedback about her triggers and symptoms over the past few weeks. The patient spoke about getting adjusted to living in to [place], [place]. The patient spoke about the impact of her physical health stressors on her mental health and noted getting off of her sleep cycle which has exacerbated her  MH symptoms. The patient spoke about focus on regulating her sleep cycle and she is adding sleep aid to help get her sleep cycle back into rhythm.The OPT therapist utilized Cognitive Behavioral Therapy through cognitive restructuring as well as worked with the patient on coping strategies to assist in management of mood and as she continues to work on family interactions,  finances, physical and mental health. The patient spoke about frustration with her relationship with her daughter and wanting to reconcile from prior conflict that is still ongoing. The patient spoke about still not talking with daughter and  not being able  to  reconcile to be able to reconnect with her grandchildren. The patient spoke about looking for. The patient spoke about working on her awareness of where her stress boundaries are and being aware of when she needs to implement coping to create balance in her life. The patient spoke about looking forward to at some point soon going to the beach and taking a vacation   Suicidal/Homicidal: Nowithout intent/plan   Therapist Response: The OPT therapist worked with the patient for the patients scheduled session. The patient was engaged in her session and gave feedback in relation to triggers, symptoms, and behavior responses over the past few weeks. The OPT therapist worked with the patient utilizing an in session Cognitive Behavioral Therapy exercise. The patient was responsive in the session and verbalized, " I have been working on regulating my sleep cycle and stopping smoking due to my health problems I know that is important". The OPT therapist worked with the patient on managing her own individual health and being mindful of her own basic care needs including eating, sleeping, exercise, and hygenie. The patient identified her need to focus on herself and realization she cannot work outside of what is in her control. The patient spoke about working to not overwhelm herself and noted even though she is in [place] and back around family working to not get herself overwhelmed by committing to family request.The OPT therapist will continue treatment work with the patient in her next scheduled session.   Plan: Return again in 3 weeks.   Diagnosis:      Axis I: Bipolar Disorder/ GAD  Axis II: No diagnosis   Collaboration of Care: No additional collaboration of care for this session.    Patient/Guardian was advised Release of Information must be obtained prior to any record release in order to collaborate their care with an outside provider. Patient/Guardian was advised if they  have not already done so to contact the registration department to sign all necessary forms in order for [place] to release information regarding their care.    Consent: Patient/Guardian gives verbal consent for treatment and assignment of benefits for services provided during this visit. Patient/Guardian expressed understanding and agreed to proceed      I discussed the assessment and treatment plan with the patient. The patient was provided an opportunity to ask questions and all were answered. The patient agreed with the plan and demonstrated an understanding of the instructions.   The patient was advised to call back or seek an in-person evaluation if the symptoms worsen or if the condition fails to improve as anticipated.   I provided 45 minutes of non-face-to-face time during this encounter.   Winfred Burn, LCSW   06/17/2023

## 2023-06-19 ENCOUNTER — Encounter: Payer: Medicaid Other | Admitting: Occupational Therapy

## 2023-06-24 DIAGNOSIS — F331 Major depressive disorder, recurrent, moderate: Secondary | ICD-10-CM | POA: Diagnosis not present

## 2023-06-24 DIAGNOSIS — F4320 Adjustment disorder, unspecified: Secondary | ICD-10-CM | POA: Diagnosis not present

## 2023-06-24 DIAGNOSIS — F410 Panic disorder [episodic paroxysmal anxiety] without agoraphobia: Secondary | ICD-10-CM | POA: Diagnosis not present

## 2023-06-26 ENCOUNTER — Encounter: Payer: Medicaid Other | Admitting: Occupational Therapy

## 2023-06-26 DIAGNOSIS — M5412 Radiculopathy, cervical region: Secondary | ICD-10-CM | POA: Diagnosis not present

## 2023-06-30 ENCOUNTER — Encounter: Payer: Self-pay | Admitting: Family Medicine

## 2023-07-01 ENCOUNTER — Other Ambulatory Visit: Payer: Self-pay | Admitting: Family Medicine

## 2023-07-01 DIAGNOSIS — I10 Essential (primary) hypertension: Secondary | ICD-10-CM

## 2023-07-02 ENCOUNTER — Encounter: Payer: Self-pay | Admitting: Internal Medicine

## 2023-07-02 ENCOUNTER — Ambulatory Visit: Payer: Medicaid Other | Admitting: Dermatology

## 2023-07-02 ENCOUNTER — Ambulatory Visit: Attending: Internal Medicine | Admitting: Internal Medicine

## 2023-07-02 VITALS — BP 128/82 | HR 99 | Ht 66.0 in | Wt 181.0 lb

## 2023-07-02 DIAGNOSIS — I10 Essential (primary) hypertension: Secondary | ICD-10-CM

## 2023-07-02 DIAGNOSIS — R079 Chest pain, unspecified: Secondary | ICD-10-CM | POA: Diagnosis not present

## 2023-07-02 DIAGNOSIS — Z01810 Encounter for preprocedural cardiovascular examination: Secondary | ICD-10-CM | POA: Diagnosis not present

## 2023-07-02 DIAGNOSIS — E269 Hyperaldosteronism, unspecified: Secondary | ICD-10-CM | POA: Diagnosis not present

## 2023-07-02 DIAGNOSIS — R011 Cardiac murmur, unspecified: Secondary | ICD-10-CM

## 2023-07-02 MED ORDER — METOPROLOL SUCCINATE ER 50 MG PO TB24: 50.0000 mg | ORAL_TABLET | Freq: Every day | ORAL | 3 refills | Status: AC

## 2023-07-02 MED ORDER — ASPIRIN 81 MG PO TBEC: 81.0000 mg | DELAYED_RELEASE_TABLET | Freq: Every day | ORAL | Status: AC

## 2023-07-02 MED ORDER — METOPROLOL TARTRATE 100 MG PO TABS: ORAL_TABLET | ORAL | 0 refills | Status: DC

## 2023-07-02 NOTE — Patient Instructions (Addendum)
 Medication Instructions:   START Aspirin 81 mg daily  TAKE Metoprolol Tartrate 2 hours prior to Coronary CT  *If you need a refill on your cardiac medications before your next appointment, please call your pharmacy*   Lab Work: BMET prior to Coronary CT If you have labs (blood work) drawn today and your tests are completely normal, you will receive your results only by: MyChart Message (if you have MyChart) OR A paper copy in the mail If you have any lab test that is abnormal or we need to change your treatment, we will call you to review the results.   Testing/Procedures:   Your cardiac CT will be scheduled at one of the below locations:   [facility] [address] [place], [place] [address] [phone]  OR  [facility] [address] [place], [place] [address] [phone]  OR   [facility] [address] [place], [place] [address] [phone]  OR   [facility] [address] [place], [place] [address] [phone]  If scheduled at [facility], please arrive at the [facility] and Children's Entrance (Entrance C2) of [facility] 30 minutes prior to test start time. You can use the FREE valet parking offered at entrance C (encouraged to control the heart rate for the test)  Proceed to the [facility] Radiology Department (first floor) to check-in and test prep.  All radiology patients and guests should use entrance C2 at [facility], accessed from [facility], even though the hospital's physical address listed is [address]    If scheduled at [facility] or [facility], please arrive 15 mins early for check-in and test prep.  There is spacious parking and easy access to the radiology department from the [facility] and Vascular entrance. Please enter here and check-in  with the desk attendant.   If scheduled at [facility], please arrive 30 minutes early for check-in and test prep.  Please follow these instructions carefully (unless otherwise directed):  An IV will be required for this test and Nitroglycerin will be given.  Hold all erectile dysfunction medications at least 3 days (72 hrs) prior to test. (Ie viagra, cialis, sildenafil, tadalafil, etc)   On the Night Before the Test: Be sure to Drink plenty of water. Do not consume any caffeinated/decaffeinated beverages or chocolate 12 hours prior to your test. Do not take any antihistamines 12 hours prior to your test. If the patient has contrast allergy: Patient will need a prescription for Prednisone and very clear instructions (as follows): Prednisone 50 mg - take 13 hours prior to test Take another Prednisone 50 mg 7 hours prior to test Take another Prednisone 50 mg 1 hour prior to test Take Benadryl 50 mg 1 hour prior to test Patient must complete all four doses of above prophylactic medications. Patient will need a ride after test due to Benadryl.  On the Day of the Test: Drink plenty of water until 1 hour prior to the test. Do not eat any food 1 hour prior to test. You may take your regular medications prior to the test.  Take metoprolol (Lopressor) two hours prior to test. If you take Furosemide/Hydrochlorothiazide/Spironolactone/Chlorthalidone, please HOLD on the morning of the test. Patients who wear a continuous glucose monitor MUST remove the device prior to scanning. FEMALES- please wear underwire-free bra if available, avoid dresses & tight clothing  After the Test: Drink plenty of water. After receiving IV contrast, you may experience a mild flushed feeling. This is normal. On occasion, you may experience a mild rash up to 24 hours after the test. This is not dangerous. If this occurs, you can take Benadryl 25 mg, Zyrtec, Claritin, or Allegra and increase your fluid  intake. (Patients taking Tikosyn should avoid Benadryl, and may take Zyrtec, Claritin, or Allegra) If you experience trouble breathing, this can be serious. If it is severe call 911 IMMEDIATELY. If it is mild, please call our office.  We will call to schedule your test 2-4 weeks out understanding that some insurance companies will need an authorization prior to the service being performed.   For more information and frequently asked questions, please visit our website : [email or web address]  For non-scheduling related questions, please contact the cardiac imaging nurse navigator should you have any questions/concerns: Cardiac Imaging Nurse Navigators Direct Office Dial: [phone]   For scheduling needs, including cancellations and rescheduling, please call [place], [phone].  CT-scan of abdomen and pelvis    Follow-Up: At [facility], you and your health needs are our priority.  As part of our continuing mission to provide you with exceptional heart care, we have created designated Provider Care Teams.  These Care Teams include your primary Cardiologist (physician) and Advanced Practice Providers (APPs -  Physician Assistants and Nurse Practitioners) who all work together to provide you with the care you need, when you need it.   Your next appointment:   1 month(s)  Provider:   Any APP  Other Instructions HOW TO TAKE YOUR BLOOD PRESSURE: Rest 5 minutes before taking your blood pressure. Don't smoke or drink caffeinated beverages for at least 30 minutes before. Take your blood pressure before (not after) you eat. Sit comfortably with your back supported and both feet on the floor (don't cross your legs). Elevate your arm to heart level on a table or a desk. Use the proper sized cuff. It should fit smoothly and snugly around your bare upper arm. There should be enough room to slip a fingertip under the cuff. The bottom edge of the cuff should be 1 inch  above the crease of the elbow. Ideally, take 3 measurements at one sitting and record the average.  Please keep log of blood pressure for 2 weeks and bring to follow up appt  Blood Pressure Log   Date   Time  Blood Pressure  Position  Example: Nov 1 9 AM 124/78 sitting                                                                                                          Blood Pressure Log   Date   Time  Blood Pressure  Position  Example: Nov 1 9 AM 124/78 sitting

## 2023-07-02 NOTE — Progress Notes (Signed)
 Cardiology Office Note:  .   Date:  07/02/2023  ID:  Bailey Hooper, DOB May 31, 1965, MRN [id number] PCP: Westley Chandler, MD  [facility] Providers Cardiologist:  None    History of Present Illness: .   Bailey Hooper is a 58 y.o. female with history of smoking , OSA , who is being referred for resistant hypertension and CAC seen on her chest CT scan.  Bailey Hooper comes in today as a new patient for me.  She Dr. Flora Lipps for a murmur. Her echo was normal.  I discussed her medication adherence with her today. She did not take her medications today and her BP is well controlled.  She states she does not always take all of her medications because it does not make her feel well.  She feels dizzy when she takes all her medications.  She notes it is possibly related to the HCTZ addition.  She had a Aldos/renin ratio that was ordered that was elevated.  She was started on eplerenone 25 mg daily.  She has history of sleep apnea.  She is not wearing the CPAP due to issues with the equipment.  She smokes and has been doing so for about 2025 years.  She is on Chantix and trying to quit.  Her parents had hypertension and her maternal grandmother had heart disease.  ROS:  per HPI otherwise negative   Studies Reviewed: Marland Kitchen    EKG Interpretation Date/Time:  Tuesday July 02 2023 16:00:49 EDT Ventricular Rate:  78 PR Interval:  202 QRS Duration:  74 QT Interval:  408 QTC Calculation: 465 R Axis:   -68  Text Interpretation: Normal sinus rhythm Left anterior fascicular block T wave abnormality, consider lateral ischemia Prolonged QT Confirmed by Carolan Clines ([id number]) on 07/02/2023 4:14:51 PM    Risk Assessment/Calculations:        Physical Exam:   VS:  BP 128/82 (BP Location: Left Arm, Patient Position: Sitting, Cuff Size: Normal)   Pulse 99   Ht 5\' 6"  (1.676 m)   Wt 181 lb (82.1 kg)   LMP 02/12/2017 Comment: not sexually active  SpO2 97%   BMI 29.21 kg/m    Wt Readings from Last 3  Encounters:  07/02/23 181 lb (82.1 kg)  06/04/23 185 lb 12.8 oz (84.3 kg)  05/14/23 184 lb 3.2 oz (83.6 kg)    GEN: Well nourished, well developed in no acute distress NECK: No JVD; No carotid bruits CARDIAC: RRR, no murmurs, rubs, gallops RESPIRATORY:  Clear to auscultation without rales, wheezing or rhonchi  ABDOMEN: Soft, non-tender, non-distended EXTREMITIES:  No edema; No deformity   ASSESSMENT AND PLAN: .   CAC/CP She notes that she is having shortness of breath and anginal symptoms with activity.  She has a smoker and is at risk for CAD. Her surveillance CT did show plaque in the LAD and left circumflex.  Her baseline EKG shows normal rhythm with deep T wave inversions laterally.  I suspect she may have hemodynamic obstruction.  - Coronary CTA with morphology - started aspirin 81 mg daily, continue Lipitor 40 mg daily, continue beta-blocker -Can review lipids on next appointment. Lp(a) normal  ?Resistant HTN Well controlled today, she did not take her meds this AM. She notes she does not always take her medications because of how it makes her feel.  Will need to reassess blood pressure with ambulatory monitoring and know the medications she is taking -Contributing factors include smoking, OSA, medication adherence, anxiety, elevated  Aldo/renin ratio. Aldosterone is not > 10, so testing not entirely conclusive. The optimal test for primary hyperaldo is an oral sodium loading test.  We do not have the capacity to do that in our lab. Will get a CT abdomen/pelvis (assess for adrenal adenoma ) -Recommend she take BP log for 2 weeks 1 hour after taking AM medications. - for now continue eplrenone 25 mg daily, olmesartan-amlodipine-hydrochlorothiazide 40-10-25, and metorpolol  OSA: not wearing her CPAP, will work with her PCP on equipment  Smoking:  recommend cessation. She is on chantix. 20-25 years   Dispo:Marland Kitchen Follow up in 4 weeks with an APP , would recommend Dr. Chilton Si as  her primary cardiologist   Signed, Wyline Mood Alben Spittle, MD

## 2023-07-08 ENCOUNTER — Ambulatory Visit (HOSPITAL_COMMUNITY): Admission: RE | Admit: 2023-07-08 | Source: Ambulatory Visit

## 2023-07-15 ENCOUNTER — Ambulatory Visit (HOSPITAL_BASED_OUTPATIENT_CLINIC_OR_DEPARTMENT_OTHER)

## 2023-07-15 ENCOUNTER — Ambulatory Visit (INDEPENDENT_AMBULATORY_CARE_PROVIDER_SITE_OTHER): Admitting: Dermatology

## 2023-07-15 ENCOUNTER — Encounter: Payer: Self-pay | Admitting: Dermatology

## 2023-07-15 VITALS — BP 137/85

## 2023-07-15 DIAGNOSIS — L816 Other disorders of diminished melanin formation: Secondary | ICD-10-CM

## 2023-07-15 DIAGNOSIS — L219 Seborrheic dermatitis, unspecified: Secondary | ICD-10-CM | POA: Diagnosis not present

## 2023-07-15 DIAGNOSIS — L299 Pruritus, unspecified: Secondary | ICD-10-CM | POA: Diagnosis not present

## 2023-07-15 MED ORDER — FLUOCINOLONE ACETONIDE SCALP 0.01 % EX OIL: 1.0000 | TOPICAL_OIL | CUTANEOUS | 3 refills | Status: AC

## 2023-07-15 MED ORDER — FLUCONAZOLE 100 MG PO TABS: 100.0000 mg | ORAL_TABLET | Freq: Every day | ORAL | 0 refills | Status: DC

## 2023-07-15 NOTE — Progress Notes (Signed)
   Follow-Up Visit   Subjective  Bailey Hooper is a 58 y.o. female who presents for the following: pokiloderma and eczema  Patient present today for follow up visit for pokiloderma and eczema . Patient was last evaluated on 04/22/23. At this visit patient was prescribed pimecrolimus and CeraVe Anti Itch. Patient reports sxs are  have not improved . Patient denies medication changes.  The following portions of the chart were reviewed this encounter and updated as appropriate: medications, allergies, medical history  Review of Systems:  No other skin or systemic complaints except as noted in HPI or Assessment and Plan.  Objective  Well appearing patient in no apparent distress; mood and affect are within normal limits.   A focused examination was performed of the following areas: Chest   Relevant exam findings are noted in the Assessment and Plan.    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 Some poikiloderma changes and accentuations of skin lines are noted, though very faint. The primary concern of itching has resolved. - Plan:    Continue pimecrolimus and CeraVe Anti-Itch as needed    Follow up if more medication is needed  2. Seborrheic dermatitis of scalp - Assessment: Patient presents with scalp itching and flaking. Clinician suspects overgrowth of Malassezia yeast, potentially triggered by seasonal changes, stress, or hormonal fluctuations. Current hair care practices, including infrequent shampooing and use of oils, may be exacerbating the condition.  - Plan:    Prescribe oral fluconazole 150mg , one tablet daily for three days    Recommend DHS Zinc shampoo (2% zinc) or provide samples of 1% zinc shampoo    Instruct patient to let shampoo sit for 3 minutes before rinsing    Prescribe topical drops for areas of itching and flaking     Prescribe Dermasmooth (fluocinonide oil) for spot treatment or overnight scalp treatment    Advise against using tea gel shampoos and adding oils to scalp    Provide samples of anti-itch lotion    Offer samples of Vichy (Delcroy) and CeraVe Zin shampoos (selenium sulfide and zinc respectively)    Provide multiple refills for prescribed oil  Follow-up in 6 months for seborrheic dermatitis reassessment. SEBORRHEIC DERMATITIS    Return in about 6 months (around 01/14/2024) for Pokiloderma and seb derm.   Documentation: I have reviewed the above documentation for accuracy and completeness, and I agree with the above.  Louana Roup, DO

## 2023-07-15 NOTE — Patient Instructions (Addendum)
 Hello Maty[place]a,  Thank you for visiting today. Here is a summary of the key instructions:  Scalp Diagnosis: Seborrheic Dermatitis  - Medications:   - Take oral fluconazole 150 mg tablet for 3 days in a row   - Use Dermasmooth (fluocinonide oil) on scalp:     - As a spot treatment or overnight oil treatment     - If using in the morning, leave on for a few hours before washing out  - Shampoos and Lotions:   - Use DHS Zinc shampoo:     - Let it sit for 3 minutes before rinsing     - Can be bought on [employer] or at [employer]   - Dunklee Vichy (Delcroy) and CeraVe shampoos together for now   - Use prescription drops on itchy and flaky areas   - Continue using pimecrolimus and CeraVe Anti-Itch lotion as needed  - Lifestyle Changes:   - Avoid adding oils to your scalp   - Avoid tea gel shampoos   - Try not to put hair up when wet  - Follow-up:   - Return for follow-up appointment in 6 months  If you have any questions or concerns, please do not hesitate to contact our office.  Warm regards,  Dr. Langston [facility]           Important Information  Due to recent changes in healthcare laws, you may see results of your pathology and/or laboratory studies on MyChart before the doctors have had a chance to review them. We understand that in some cases there may be results that are confusing or concerning to you. Please understand that not all results are received at the same time and often the doctors may need to interpret multiple results in order to provide you with the best plan of care or course of treatment. Therefore, we ask that you please give [place] 2 business days to thoroughly review all your results before contacting the office for clarification. Should we see a critical lab result, you will be contacted sooner.   If You Need Anything After Your Visit  If you have any questions or concerns for your doctor, please call our main line at [phone] If no one  answers, please leave a voicemail as directed and we will return your call as soon as possible. Messages left after 4 pm will be answered the following business day.   You may also send [place] a message via MyChart. We typically respond to MyChart messages within 1-2 business days.  For prescription refills, please ask your pharmacy to contact our office. Our fax number is [phone].  If you have an urgent issue when the clinic is closed that cannot wait until the next business day, you can page your doctor at the number below.    Please note that while we do our best to be available for urgent issues outside of office hours, we are not available 24/7.   If you have an urgent issue and are unable to reach [place], you may choose to seek medical care at your doctor's office, retail clinic, urgent care center, or emergency room.  If you have a medical emergency, please immediately call 911 or go to the emergency department. In the event of inclement weather, please call our main line at [phone] for an update on the status of any delays or closures.  Dermatology Medication Tips: Please keep the boxes that topical medications come in in order to help keep track  of the instructions about where and how to use these. Pharmacies typically print the medication instructions only on the boxes and not directly on the medication tubes.   If your medication is too expensive, please contact our office at [phone] or send [place] a message through MyChart.   We are unable to tell what your co-pay for medications will be in advance as this is different depending on your insurance coverage. However, we may be able to find a substitute medication at lower cost or fill out paperwork to get insurance to cover a needed medication.   If a prior authorization is required to get your medication covered by your insurance company, please allow [place] 1-2 business days to complete this process.  Drug prices often vary  depending on where the prescription is filled and some pharmacies may offer cheaper prices.  The website www.goodrx.com contains coupons for medications through different pharmacies. The prices here do not account for what the cost may be with help from insurance (it may be cheaper with your insurance), but the website can give you the price if you did not use any insurance.  - You can print the associated coupon and take it with your prescription to the pharmacy.  - You may also stop by our office during regular business hours and pick up a GoodRx coupon card.  - If you need your prescription sent electronically to a different pharmacy, notify our office through [facility] or by phone at [phone]

## 2023-07-17 ENCOUNTER — Ambulatory Visit (HOSPITAL_COMMUNITY)

## 2023-07-17 ENCOUNTER — Ambulatory Visit (HOSPITAL_BASED_OUTPATIENT_CLINIC_OR_DEPARTMENT_OTHER)

## 2023-07-17 DIAGNOSIS — R748 Abnormal levels of other serum enzymes: Secondary | ICD-10-CM | POA: Diagnosis not present

## 2023-07-17 DIAGNOSIS — K760 Fatty (change of) liver, not elsewhere classified: Secondary | ICD-10-CM | POA: Diagnosis not present

## 2023-07-18 ENCOUNTER — Other Ambulatory Visit: Payer: Self-pay | Admitting: Neurosurgery

## 2023-07-18 DIAGNOSIS — M5412 Radiculopathy, cervical region: Secondary | ICD-10-CM

## 2023-07-19 ENCOUNTER — Ambulatory Visit
Admission: RE | Admit: 2023-07-19 | Discharge: 2023-07-19 | Discharge status: Home / Self Care | Source: Ambulatory Visit | Attending: Neurosurgery | Admitting: Neurosurgery

## 2023-07-19 DIAGNOSIS — M5412 Radiculopathy, cervical region: Secondary | ICD-10-CM

## 2023-07-20 ENCOUNTER — Ambulatory Visit (HOSPITAL_BASED_OUTPATIENT_CLINIC_OR_DEPARTMENT_OTHER)

## 2023-07-25 ENCOUNTER — Telehealth (HOSPITAL_COMMUNITY): Payer: Self-pay | Admitting: *Deleted

## 2023-07-25 NOTE — Telephone Encounter (Signed)
 Attempted to call patient regarding upcoming cardiac CT appointment. Left message on voicemail with name and callback number Johney Frame RN Navigator Cardiac Imaging [facility] [phone] Office

## 2023-07-25 NOTE — Telephone Encounter (Signed)
 Reaching out to patient to offer assistance regarding upcoming cardiac imaging study; pt verbalizes understanding of appt date/time, parking situation and where to check in, pre-test NPO status and medications ordered, and verified current allergies; name and call back number provided for further questions should they arise Johney Frame RN Navigator Cardiac Imaging [facility] [phone] office [phone] cell

## 2023-07-26 ENCOUNTER — Ambulatory Visit (HOSPITAL_COMMUNITY)
Admission: RE | Admit: 2023-07-26 | Discharge: 2023-07-26 | Disposition: A | Discharge status: Home / Self Care | Source: Ambulatory Visit | Attending: Internal Medicine | Admitting: Internal Medicine

## 2023-07-26 DIAGNOSIS — I2510 Atherosclerotic heart disease of native coronary artery without angina pectoris: Secondary | ICD-10-CM

## 2023-07-26 DIAGNOSIS — R079 Chest pain, unspecified: Secondary | ICD-10-CM | POA: Insufficient documentation

## 2023-07-26 DIAGNOSIS — R931 Abnormal findings on diagnostic imaging of heart and coronary circulation: Secondary | ICD-10-CM | POA: Insufficient documentation

## 2023-07-26 MED ORDER — IOHEXOL 350 MG/ML SOLN
95.0000 mL | Freq: Once | INTRAVENOUS | Status: AC | PRN
  Administered 2023-07-26: 95 mL via INTRAVENOUS

## 2023-07-26 MED ORDER — NITROGLYCERIN 0.4 MG SL SUBL
0.8000 mg | SUBLINGUAL_TABLET | Freq: Once | SUBLINGUAL | Status: AC
  Administered 2023-07-26: 0.8 mg via SUBLINGUAL

## 2023-07-26 MED ORDER — DILTIAZEM HCL 25 MG/5ML IV SOLN: 10.0000 mg | INTRAVENOUS | Status: DC | PRN

## 2023-07-26 MED ORDER — NITROGLYCERIN 0.4 MG SL SUBL
SUBLINGUAL_TABLET | SUBLINGUAL | Status: AC
Start: 2023-07-26 — End: ?
  Filled 2023-07-26: qty 2

## 2023-07-26 MED ORDER — METOPROLOL TARTRATE 5 MG/5ML IV SOLN: 10.0000 mg | Freq: Once | INTRAVENOUS | Status: DC | PRN

## 2023-07-26 NOTE — Progress Notes (Addendum)
 Patient presents for cardiac CT and tolerated procedure without incident.  Patient maintained acceptable vital signs.  Patient ambulated out of department with a steady gait.

## 2023-07-29 ENCOUNTER — Ambulatory Visit (INDEPENDENT_AMBULATORY_CARE_PROVIDER_SITE_OTHER): Admitting: Clinical

## 2023-07-29 ENCOUNTER — Other Ambulatory Visit: Payer: Self-pay | Admitting: Cardiology

## 2023-07-29 ENCOUNTER — Ambulatory Visit (HOSPITAL_BASED_OUTPATIENT_CLINIC_OR_DEPARTMENT_OTHER)
Admission: RE | Admit: 2023-07-29 | Discharge: 2023-07-29 | Disposition: A | Discharge status: Home / Self Care | Source: Ambulatory Visit | Attending: Cardiology | Admitting: Cardiology

## 2023-07-29 DIAGNOSIS — F319 Bipolar disorder, unspecified: Secondary | ICD-10-CM | POA: Diagnosis not present

## 2023-07-29 DIAGNOSIS — R931 Abnormal findings on diagnostic imaging of heart and coronary circulation: Secondary | ICD-10-CM

## 2023-07-29 DIAGNOSIS — F411 Generalized anxiety disorder: Secondary | ICD-10-CM

## 2023-07-29 DIAGNOSIS — I2510 Atherosclerotic heart disease of native coronary artery without angina pectoris: Secondary | ICD-10-CM | POA: Diagnosis not present

## 2023-07-29 DIAGNOSIS — F3132 Bipolar disorder, current episode depressed, moderate: Secondary | ICD-10-CM

## 2023-07-29 NOTE — Progress Notes (Signed)
 Virtual Visit via Video Note   I connected with Bailey Hooper on 07/29/23 at  4:00 PM EDT by a video enabled telemedicine application and verified that I am speaking with the correct person using two identifiers.   Location: Patient: home Provider: office   I discussed the limitations of evaluation and management by telemedicine and the availability of in person appointments. The patient expressed understanding and agreed to proceed.   THERAPIST PROGRESS NOTE   Session Time: 4:00 PM-4:45 PM   Participation Level: Active   Behavioral Response: CasualAlertAnxious   Type of Therapy: Individual Therapy   Treatment Goals addressed: Coping   Interventions: CBT   Summary: Bailey Hooper is a 58 y.o. female who presents with Bipolar Disorder./ GAD. The OPT therapist worked with the patient for her scheduled OPT session. The OPT therapist utilized Motivational Interviewing to assist in creating therapeutic repore. The patient in the session was engaged and work in collaboration giving feedback about her triggers and symptoms over the past few weeks. The patient spoke about getting adjusted to living in to [place], [place].  And recently getting new furniture. The patient spoke about the impact of her physical health stressors on her mental health and noted getting off of her sleep cycle which has exacerbated her  MH symptoms. The patient spoke about focus on regulating her sleep cycle and she is adding sleep aid to help get her sleep cycle back into rhythm.The OPT therapist utilized Cognitive Behavioral Therapy through cognitive restructuring as well as worked with the patient on coping strategies to assist in management of mood and as she continues to work on family interactions,  finances, physical and mental health. The patient spoke about frustration with her relationship with her daughter and wanting to reconcile from prior conflict and deciding recently to reach out and email her daughter. The patient  spoke about missing time with her grandchildren and her grandchild's birthday coming up in May and this triggering her to want to reconcile with her daughter.  The OPT therapist spoke about this being triangulation.The patient spoke about looking for. The patient spoke about working on her awareness of where her stress boundaries are and being aware of when she needs to implement coping to create balance in her life. The patient spoke about her ongoing disability case. The patient spoke about working on stopping smoking/cessation.   Suicidal/Homicidal: Nowithout intent/plan   Therapist Response: The OPT therapist worked with the patient for the patients scheduled session. The patient was engaged in her session and gave feedback in relation to triggers, symptoms, and behavior responses over the past few weeks. The OPT therapist worked with the patient utilizing an in session Cognitive Behavioral Therapy exercise. The patient was responsive in the session and verbalized, " I tried reaching out via email and then my grandchild emailed back saying this is my number so then I tried to call it but the number is disconnected so I do not even know what is going on with this". The OPT therapist worked with the patient on managing her own individual health and being mindful of her own basic care needs including eating, sleeping, exercise, and hygenie. The patient identified her need to focus on herself and realization she cannot work outside of what is in her control. The patient spoke about working to not overwhelm herself and noted even though she is in [place] and back around family working to not get herself overwhelmed by committing to family request. The patient spoke about  her physical health concerns and this amplifying her need to manage her stressors. The patient spoke about her ongoing involvement with health specialist including her Cardiologist.The OPT therapist will continue treatment work with the  patient in her next scheduled session.   Plan: Return again in 3 weeks.   Diagnosis:      Axis I: Bipolar Disorder/ GAD                             Axis II: No diagnosis   Collaboration of Care: No additional collaboration of care for this session.    Patient/Guardian was advised Release of Information must be obtained prior to any record release in order to collaborate their care with an outside provider. Patient/Guardian was advised if they have not already done so to contact the registration department to sign all necessary forms in order for us  to release information regarding their care.    Consent: Patient/Guardian gives verbal consent for treatment and assignment of benefits for services provided during this visit. Patient/Guardian expressed understanding and agreed to proceed      I discussed the assessment and treatment plan with the patient. The patient was provided an opportunity to ask questions and all were answered. The patient agreed with the plan and demonstrated an understanding of the instructions.   The patient was advised to call back or seek an in-person evaluation if the symptoms worsen or if the condition fails to improve as anticipated.   I provided 45 minutes of non-face-to-face time during this encounter.   Lea Primmer, LCSW   07/29/2023

## 2023-07-29 NOTE — H&P (View-Only) (Signed)
 Cardiology Office Note:    Date:  08/06/2023   ID:  [name], DOB December 30, 1965, MRN [id number]  PCP:  Azell Boll, MD  Cardiologist:  None     Referring MD: Azell Boll, MD   Chief Complaint: follow-up of chest pain and dyspnea as well as abnormal coronary CTA  History of Present Illness:    ALANTA GROENE is a 58 y.o. female with a history of multivessel CAD with positive FFR on recent coronary CTA on 07/26/2023, hypertension, hyperlipidemia, obstructive sleep apnea, GERD, non-alcoholic fatty liver disease, IBS, migraines, anxiety/ depression,  bipolar disorder, and tobacco abuse who presents today for follow-up of chest pain and shortness of breath as well as abnormal coronary CTA.  Patient was seen by Dr. Rolm Clos in 11/2018 for further evaluation of a murmur and dyspnea on exertion. Echo was ordered and showed LVEF of 60-65% with grade 2 diastolic dysfunction, normal RV function, and no significant valvular disease. She was not seen again by Cardiology until recent when she was seen by Dr. Amanda Jungling in 06/2023 for resistant hypertension and coronary artery calcifications noted on CT scan. At that visit, she reported not always taking her BP medications because they did not make her feel well. She reported dizziness when she takes all of her medications. She also reported non-compliance with her CPAP machine. Aldosterone:Renin ratio in 05/2023 was elevated at 32.4 but Aldosterone level was <10 so not conclusive. She was started on Eplerenone  at that time. She also reported chest pain and shortness of breath with activity. Coronary CTA was ordered for further evaluation. Abdominal/ pelvic CT was also ordered to assess for adrenal adenoma. Coronary CTA showed a coronary calcium  score of 331 with at least moderate multivessel CAD with abnormal FFR in RCA and LCX and borderline positive FFR in LAD. Abdominal/ pelvic CT is scheduled for early next month.  Patient presents today for follow-up. She  continues to have intermittent chest heaviness which she states she has been having for years. It is more with exertion and typically resolves quickly with rest. She states it has been hard to determine if her chest pain is due to her heart or her chronic asthma/ bronchitis. She also has chronic dyspnea on exertion and states she sometimes has to stop when walking. However, she feels like her breathing is stable. No orthopnea. She reports occasional brief palpitations but states these are infrequent. She also reports intermittent episodes of lightheadedness/ dizziness that mostly occur when she is driving - she states this is followed by nausea/ vomiting and an urgent need to have a bowel movement. She sometimes has heart racing with this. She does report one syncopal episode 2 year ago. She states she had a URI at that time and took some cold medicine. She was walking to the couch when she started to feel lightheaded. The next thing she new she woke up on the sofa by "dried vomit." She does not know how long she was unconscious for. She states she went to an Urgent Care after this event and was told her EKG looked good. She denies any syncopal episodes since then.   She also describes feeling tired all the times. She states she wakes up tired and goes to bed feeling tired. She does have a history of sleep apnea but has not used her CPAP machine in years. Her STOP BANG score = 5 suggesting she is at high risk for moderate to severe sleep apnea.   EKGs/Labs/Other Studies  Reviewed:    The following studies were reviewed:  Echocardiogram 12/09/2018: Impressions:  1. The left ventricle has normal systolic function with an ejection  fraction of 60-65%. The cavity size was normal. Left ventricular diastolic  Doppler parameters are consistent with impaired relaxation.   2. The right ventricle has normal systolic function. The cavity was  normal. There is no increase in right ventricular wall thickness.   3. No  evidence of mitral valve stenosis.   4. No stenosis of the aortic valve.   5. The aorta is normal unless otherwise noted.   6. The aortic root and ascending aorta are normal in size and structure.   7. The atrial septum is grossly normal.  _______________  Coronary CTA 07/26/2023: Impressions: 1.  Coronary calcium  score of 331. 2. Total plaque volume (TPV) 461 mm3 which is 87 percentile for age-and sex matched controls (calcified plaque 45 mm3; non-calcified plaque 416 mm3). TPV is severe. 3.  Normal coronary origin with right dominance. 4. Multivessel at least moderate CAD with abnormal FFR in RCA and CX and borderline positive FFR in LAD. Recommend cardiac catheterization for further evaluation and clarification.  EKG:  EKG ordered today.  EKG Interpretation Date/Time:  Tuesday August 06 2023 14:43:54 EDT Ventricular Rate:  71 PR Interval:  212 QRS Duration:  74 QT Interval:  388 QTC Calculation: 421 R Axis:   -63  Text Interpretation: Sinus rhythm with 1st degree A-V block Possible Left atrial enlargement Left axis deviation  No acute ischemic changes. Confirmed by Zayin Valadez [id number]) on 08/06/2023 2:52:43 PM    Recent Labs: 08/29/2022: Hemoglobin 13.3; Platelets 343; TSH 1.690 06/04/2023: ALT 16; BUN 14; Creatinine, Ser 0.80; Potassium 4.3; Sodium 143  Recent Lipid Panel    Component Value Date/Time   CHOL 161 08/29/2022 1526   TRIG 94 08/29/2022 1526   HDL 50 08/29/2022 1526   CHOLHDL 3.2 08/29/2022 1526   CHOLHDL 3.3 06/11/2016 1637   VLDL 15 06/11/2016 1637   LDLCALC 94 08/29/2022 1526   LDLDIRECT 128 (H) 02/03/2021 1216    Physical Exam:    Vital Signs: BP 124/86   Pulse 74   Ht 5\' 6"  (1.676 m)   Wt 184 lb (83.5 kg)   LMP 02/12/2017 Comment: not sexually active  SpO2 100%   BMI 29.70 kg/m     Wt Readings from Last 3 Encounters:  08/06/23 184 lb (83.5 kg)  07/02/23 181 lb (82.1 kg)  06/04/23 185 lb 12.8 oz (84.3 kg)     General: 58 y.o.  African-American female in no acute distress. HEENT: Normocephalic and atraumatic. Sclera clear.  Neck: Supple.  No JVD. Heart: RRR. Distinct S1 and S2. No murmurs, gallops, or rubs.  Lungs: No increased work of breathing. Clear to ausculation bilaterally. No wheezes, rhonchi, or rales.  Extremities: No lower extremity edema.  Radial pulses 2+ and equal bilaterally. Skin: Warm and dry. Neuro: No focal deficits. Psych: Normal affect. Responds appropriately.   Assessment:    1. Chest heaviness   2. Coronary artery disease involving native coronary artery of native heart without angina pectoris   3. Dizziness   4. Essential hypertension, benign   5. Hyperlipidemia, unspecified hyperlipidemia type   6. Obstructive sleep apnea     Plan:    Chest Heaviness CAD Patient reported chest pain and dyspnea at last office visit in 06/2023. Coronary CTA on 07/26/2023 showed a coronary calcium  score of 331 with at least moderate multivessel CAD with abnormal FFR in  RCA and LCX and borderline positive FFR in LAD.  - She continues to have chest heaviness and dyspnea on exertion.  - Continue aspirin  and high-intensity statin.  - Will arrange outpatient cardiac catheterization. Will check pre-procedural labs today (CBC and BMET).  - Will provide prescription of sublingual Nitroglycerin . Discussed ED precautions.   Shared Decision Making/Informed Consent{ The risks [stroke (1 in 1000), death (1 in 1000), kidney failure [usually temporary] (1 in 500), bleeding (1 in 200), allergic reaction [possibly serious] (1 in 200)], benefits (diagnostic support and management of coronary artery disease) and alternatives of a cardiac catheterization were discussed in detail with Ms. Harkless and she is willing to proceed.  Resistant Hypertension Patient has a history of resistant hypertension; however, she did admit at last visit that she was not always compliant with her medications. She also reported non-compliance with  CPAP. Aldosterone:Renin ratio in 05/2023 was elevated at 32.4 but Aldosterone level was <10 so not conclusive.  - BP is well controlled in the office today and she states this is actually without taking any medications.  - Current medications: Olmesartan -Amlodipine -HCTZ 40-10-25mg  daily, Toprol -XL 50mg  daily, and Eplerenone  25mg  daily. However, she states she never takes all of these medicines on the same day because it drops her BP too low. She states she takes the Toprol -XL at night and then will take the Olmesartan -Amlodipine -HCTZ some mornings and Eplerenone  on other mornings. Recommended holding Eplerenone  and just taking the Olmesartan -Amlodipine -HCTZ and Toprol -XL for now.  - Advised patient to keep a BP/HR log and bring to follow-up visit and we can adjust medications if needed at that time.  - Abdominal/ pelvic CT was ordered at last visit to look for an adrenal adenoma. This is scheduled for 08/13/2023.   Dizziness Patient reports occasional dizziness usually while driving that is followed by nausea/ vomiting and an urgent need to have a bowel movement. She sometimes has some palpitations that she describes as heart racing with this. No syncope with this. However, she does report one episode of syncope 2 years that was preceded by lightheadedness and occurred in setting of URI and using OTC cold medications.  - Initially offered a outpatient monitor but it sounds like these episodes occur randomly and infrequently so joint decision was made to hold off on this for now. However, advised patient to let us  know if these symptoms get worse or more frequent. Would recommend monitor at that time.   Hyperlipidemia Lipid panel in 08/2022: Total Cholesterol 161, Triglycerides 94, HDL 50, LDL 94. Lipoprotein (a) in 02/2023. LDL goal <70 given CAD. - Continue Lipitor 40mg  daily  - Will repeat lipid panel. She is not fasting today so she will come back for this.  Recent LFTs normal. If LDL still above goal,  will need to increase Lipitor to 80mg  daily.   Obstructive Sleep Apnea Patient has a history of obstructive sleep apnea but has not used her CPAP machine in years. This is likely contributing to her resistant hypertension.  - STOP-BANG score =5 suggesting she is at high risk for moderate to severe sleep apnea.  - She would benefit from a repeat sleep study and is willing to proceed with at home Itmar sleep study. Will order.    Disposition: Follow up in 2 weeks after cardiac catheterization. Patient was previously followed by Dr. Alois Arnt. Given her resistant hypertension, will have her establish with Dr. Theodis Fiscal. She will follow-up in the [facility] office after cardiac cath.    Signed, Gerianne Simonet E Brileigh Sevcik,  PA-C  08/06/2023 8:24 PM    [facility]

## 2023-07-29 NOTE — Progress Notes (Signed)
 Cardiology Office Note:    Date:  08/06/2023   ID:  Bailey Hooper, DOB December 30, 1965, MRN [id number]  PCP:  Azell Boll, MD  Cardiologist:  None     Referring MD: Azell Boll, MD   Chief Complaint: follow-up of chest pain and dyspnea as well as abnormal coronary CTA  History of Present Illness:    Bailey Hooper is a 58 y.o. female with a history of multivessel CAD with positive FFR on recent coronary CTA on 07/26/2023, hypertension, hyperlipidemia, obstructive sleep apnea, GERD, non-alcoholic fatty liver disease, IBS, migraines, anxiety/ depression,  bipolar disorder, and tobacco abuse who presents today for follow-up of chest pain and shortness of breath as well as abnormal coronary CTA.  Patient was seen by Dr. Rolm Clos in 11/2018 for further evaluation of a murmur and dyspnea on exertion. Echo was ordered and showed LVEF of 60-65% with grade 2 diastolic dysfunction, normal RV function, and no significant valvular disease. She was not seen again by Cardiology until recent when she was seen by Dr. Amanda Jungling in 06/2023 for resistant hypertension and coronary artery calcifications noted on CT scan. At that visit, she reported not always taking her BP medications because they did not make her feel well. She reported dizziness when she takes all of her medications. She also reported non-compliance with her CPAP machine. Aldosterone:Renin ratio in 05/2023 was elevated at 32.4 but Aldosterone level was <10 so not conclusive. She was started on Eplerenone  at that time. She also reported chest pain and shortness of breath with activity. Coronary CTA was ordered for further evaluation. Abdominal/ pelvic CT was also ordered to assess for adrenal adenoma. Coronary CTA showed a coronary calcium  score of 331 with at least moderate multivessel CAD with abnormal FFR in RCA and LCX and borderline positive FFR in LAD. Abdominal/ pelvic CT is scheduled for early next month.  Patient presents today for follow-up. She  continues to have intermittent chest heaviness which she states she has been having for years. It is more with exertion and typically resolves quickly with rest. She states it has been hard to determine if her chest pain is due to her heart or her chronic asthma/ bronchitis. She also has chronic dyspnea on exertion and states she sometimes has to stop when walking. However, she feels like her breathing is stable. No orthopnea. She reports occasional brief palpitations but states these are infrequent. She also reports intermittent episodes of lightheadedness/ dizziness that mostly occur when she is driving - she states this is followed by nausea/ vomiting and an urgent need to have a bowel movement. She sometimes has heart racing with this. She does report one syncopal episode 2 year ago. She states she had a URI at that time and took some cold medicine. She was walking to the couch when she started to feel lightheaded. The next thing she new she woke up on the sofa by "dried vomit." She does not know how long she was unconscious for. She states she went to an Urgent Care after this event and was told her EKG looked good. She denies any syncopal episodes since then.   She also describes feeling tired all the times. She states she wakes up tired and goes to bed feeling tired. She does have a history of sleep apnea but has not used her CPAP machine in years. Her STOP BANG score = 5 suggesting she is at high risk for moderate to severe sleep apnea.   EKGs/Labs/Other Studies  Reviewed:    The following studies were reviewed:  Echocardiogram 12/09/2018: Impressions:  1. The left ventricle has normal systolic function with an ejection  fraction of 60-65%. The cavity size was normal. Left ventricular diastolic  Doppler parameters are consistent with impaired relaxation.   2. The right ventricle has normal systolic function. The cavity was  normal. There is no increase in right ventricular wall thickness.   3. No  evidence of mitral valve stenosis.   4. No stenosis of the aortic valve.   5. The aorta is normal unless otherwise noted.   6. The aortic root and ascending aorta are normal in size and structure.   7. The atrial septum is grossly normal.  _______________  Coronary CTA 07/26/2023: Impressions: 1.  Coronary calcium  score of 331. 2. Total plaque volume (TPV) 461 mm3 which is 87 percentile for age-and sex matched controls (calcified plaque 45 mm3; non-calcified plaque 416 mm3). TPV is severe. 3.  Normal coronary origin with right dominance. 4. Multivessel at least moderate CAD with abnormal FFR in RCA and CX and borderline positive FFR in LAD. Recommend cardiac catheterization for further evaluation and clarification.  EKG:  EKG ordered today.  EKG Interpretation Date/Time:  Tuesday August 06 2023 14:43:54 EDT Ventricular Rate:  71 PR Interval:  212 QRS Duration:  74 QT Interval:  388 QTC Calculation: 421 R Axis:   -63  Text Interpretation: Sinus rhythm with 1st degree A-V block Possible Left atrial enlargement Left axis deviation  No acute ischemic changes. Confirmed by Zayin Valadez [id number] on 08/06/2023 2:52:43 PM    Recent Labs: 08/29/2022: Hemoglobin 13.3; Platelets 343; TSH 1.690 06/04/2023: ALT 16; BUN 14; Creatinine, Ser 0.80; Potassium 4.3; Sodium 143  Recent Lipid Panel    Component Value Date/Time   CHOL 161 08/29/2022 1526   TRIG 94 08/29/2022 1526   HDL 50 08/29/2022 1526   CHOLHDL 3.2 08/29/2022 1526   CHOLHDL 3.3 06/11/2016 1637   VLDL 15 06/11/2016 1637   LDLCALC 94 08/29/2022 1526   LDLDIRECT 128 (H) 02/03/2021 1216    Physical Exam:    Vital Signs: BP 124/86   Pulse 74   Ht 5\' 6"  (1.676 m)   Wt 184 lb (83.5 kg)   LMP 02/12/2017 Comment: not sexually active  SpO2 100%   BMI 29.70 kg/m     Wt Readings from Last 3 Encounters:  08/06/23 184 lb (83.5 kg)  07/02/23 181 lb (82.1 kg)  06/04/23 185 lb 12.8 oz (84.3 kg)     General: 58 y.o.  African-American female in no acute distress. HEENT: Normocephalic and atraumatic. Sclera clear.  Neck: Supple.  No JVD. Heart: RRR. Distinct S1 and S2. No murmurs, gallops, or rubs.  Lungs: No increased work of breathing. Clear to ausculation bilaterally. No wheezes, rhonchi, or rales.  Extremities: No lower extremity edema.  Radial pulses 2+ and equal bilaterally. Skin: Warm and dry. Neuro: No focal deficits. Psych: Normal affect. Responds appropriately.   Assessment:    1. Chest heaviness   2. Coronary artery disease involving native coronary artery of native heart without angina pectoris   3. Dizziness   4. Essential hypertension, benign   5. Hyperlipidemia, unspecified hyperlipidemia type   6. Obstructive sleep apnea     Plan:    Chest Heaviness CAD Patient reported chest pain and dyspnea at last office visit in 06/2023. Coronary CTA on 07/26/2023 showed a coronary calcium  score of 331 with at least moderate multivessel CAD with abnormal FFR in  RCA and LCX and borderline positive FFR in LAD.  - She continues to have chest heaviness and dyspnea on exertion.  - Continue aspirin  and high-intensity statin.  - Will arrange outpatient cardiac catheterization. Will check pre-procedural labs today (CBC and BMET).  - Will provide prescription of sublingual Nitroglycerin . Discussed ED precautions.   Shared Decision Making/Informed Consent{ The risks [stroke (1 in 1000), death (1 in 1000), kidney failure [usually temporary] (1 in 500), bleeding (1 in 200), allergic reaction [possibly serious] (1 in 200)], benefits (diagnostic support and management of coronary artery disease) and alternatives of a cardiac catheterization were discussed in detail with Ms. Harkless and she is willing to proceed.  Resistant Hypertension Patient has a history of resistant hypertension; however, she did admit at last visit that she was not always compliant with her medications. She also reported non-compliance with  CPAP. Aldosterone:Renin ratio in 05/2023 was elevated at 32.4 but Aldosterone level was <10 so not conclusive.  - BP is well controlled in the office today and she states this is actually without taking any medications.  - Current medications: Olmesartan -Amlodipine -HCTZ 40-10-25mg  daily, Toprol -XL 50mg  daily, and Eplerenone  25mg  daily. However, she states she never takes all of these medicines on the same day because it drops her BP too low. She states she takes the Toprol -XL at night and then will take the Olmesartan -Amlodipine -HCTZ some mornings and Eplerenone  on other mornings. Recommended holding Eplerenone  and just taking the Olmesartan -Amlodipine -HCTZ and Toprol -XL for now.  - Advised patient to keep a BP/HR log and bring to follow-up visit and we can adjust medications if needed at that time.  - Abdominal/ pelvic CT was ordered at last visit to look for an adrenal adenoma. This is scheduled for 08/13/2023.   Dizziness Patient reports occasional dizziness usually while driving that is followed by nausea/ vomiting and an urgent need to have a bowel movement. She sometimes has some palpitations that she describes as heart racing with this. No syncope with this. However, she does report one episode of syncope 2 years that was preceded by lightheadedness and occurred in setting of URI and using OTC cold medications.  - Initially offered a outpatient monitor but it sounds like these episodes occur randomly and infrequently so joint decision was made to hold off on this for now. However, advised patient to let us  know if these symptoms get worse or more frequent. Would recommend monitor at that time.   Hyperlipidemia Lipid panel in 08/2022: Total Cholesterol 161, Triglycerides 94, HDL 50, LDL 94. Lipoprotein (a) in 02/2023. LDL goal <70 given CAD. - Continue Lipitor 40mg  daily  - Will repeat lipid panel. She is not fasting today so she will come back for this.  Recent LFTs normal. If LDL still above goal,  will need to increase Lipitor to 80mg  daily.   Obstructive Sleep Apnea Patient has a history of obstructive sleep apnea but has not used her CPAP machine in years. This is likely contributing to her resistant hypertension.  - STOP-BANG score =5 suggesting she is at high risk for moderate to severe sleep apnea.  - She would benefit from a repeat sleep study and is willing to proceed with at home Itmar sleep study. Will order.    Disposition: Follow up in 2 weeks after cardiac catheterization. Patient was previously followed by Dr. Alois Arnt. Given her resistant hypertension, will have her establish with Dr. Theodis Fiscal. She will follow-up in the [facility] office after cardiac cath.    Signed, Gerianne Simonet E Brileigh Sevcik,  PA-C  08/06/2023 8:24 PM    [facility]

## 2023-08-06 ENCOUNTER — Encounter: Payer: Self-pay | Admitting: Student

## 2023-08-06 ENCOUNTER — Ambulatory Visit: Attending: Student | Admitting: Student

## 2023-08-06 VITALS — BP 124/86 | HR 74 | Ht 66.0 in | Wt 184.0 lb

## 2023-08-06 DIAGNOSIS — G4733 Obstructive sleep apnea (adult) (pediatric): Secondary | ICD-10-CM | POA: Insufficient documentation

## 2023-08-06 DIAGNOSIS — R0789 Other chest pain: Secondary | ICD-10-CM | POA: Diagnosis not present

## 2023-08-06 DIAGNOSIS — I2510 Atherosclerotic heart disease of native coronary artery without angina pectoris: Secondary | ICD-10-CM | POA: Diagnosis not present

## 2023-08-06 DIAGNOSIS — I10 Essential (primary) hypertension: Secondary | ICD-10-CM | POA: Insufficient documentation

## 2023-08-06 DIAGNOSIS — E785 Hyperlipidemia, unspecified: Secondary | ICD-10-CM | POA: Insufficient documentation

## 2023-08-06 DIAGNOSIS — R42 Dizziness and giddiness: Secondary | ICD-10-CM | POA: Diagnosis not present

## 2023-08-06 MED ORDER — NITROGLYCERIN 0.4 MG SL SUBL: 0.4000 mg | SUBLINGUAL_TABLET | SUBLINGUAL | 1 refills | Status: DC | PRN

## 2023-08-06 NOTE — Patient Instructions (Signed)
 Medication Instructions:  NITROGLYCERIN  TAKE ON ONSET OF CHEST PAIN AND IF CHEST PAIN CONTINUES MAY TAKE 2 ADDITIONAL DOSES EVERY 3 MINUTE MAY ONLY TAKE A TOTAL OF 3 DOSES IF YOU TAKE THE 3rd DOSE GO TO THE ER IMMEDIATELY *If you need a refill on your cardiac medications before your next appointment, please call your pharmacy*  Lab Work: CBC AND BMET TODAY; COME BACK FASTING FOR LIPID PANEL If you have labs (blood work) drawn today and your tests are completely normal, you will receive your results only by:  MyChart Message (if you have MyChart) OR A paper copy in the mail If you have any lab test that is abnormal or we need to change your treatment, we will call you to review the results.  Testing/Procedures: LEFT HEART CATH-IF YOU HAVE ANY CHEST PAIN BEFORE CATH GO TO THE ER!!  Your physician has requested that you have an echocardiogram. Echocardiography is a painless test that uses sound waves to create images of your heart. It provides your doctor with information about the size and shape of your heart and how well your heart's chambers and valves are working. This procedure takes approximately one hour. There are no restrictions for this procedure. Please do NOT wear cologne, perfume, aftershave, or lotions (deodorant is allowed). Please arrive 15 minutes prior to your appointment time.  Please note: We ask at that you not bring children with you during ultrasound (echo/ vascular) testing. Due to room size and safety concerns, children are not allowed in the ultrasound rooms during exams. Our front office staff cannot provide observation of children in our lobby area while testing is being conducted. An adult accompanying a patient to their appointment will only be allowed in the ultrasound room at the discretion of the ultrasound technician under special circumstances. We apologize for any inconvenience.   Follow-Up: At [facility], you and your health needs are our priority.  As  part of our continuing mission to provide you with exceptional heart care, our providers are all part of one team.  This team includes your primary Cardiologist (physician) and Advanced Practice Providers or APPs (Physician Assistants and Nurse Practitioners) who all work together to provide you with the care you need, when you need it.  Your next appointment:   1-2 week(s) AFTER YOUR LEFT CATH  Provider:   Maudine Sos, MD or Neomi Banks, NP    Addendum: pt brought to resource nurse at this time but she was unable to wait due to another appointment coming up today.  She is going to call and arrange the left heart cath and the echocardiogram.  She will stop at the first floor [facility] today before.  MWilson/RN 08/06/23 4:01 pm

## 2023-08-07 ENCOUNTER — Telehealth: Payer: Self-pay

## 2023-08-07 NOTE — Telephone Encounter (Signed)
  [place] [facility] A DEPT OF [place] AT [address] A DEPT OF THE [place] [address] [place] [address] Dept: [phone] Loc: [phone]  Bailey Hooper  08/07/2023  You are scheduled for a Cardiac Catheterization on Thursday, May 8 with Dr. Veryl Gottron End.  1. Please arrive at the [facility] (Main Entrance A) at [facility]: [address] [place], [place] [address] at 8:30 AM (This time is 2 hour(s) before your procedure to ensure your preparation).   Free valet parking service is available. You will check in at ADMITTING. The support person will be asked to wait in the waiting room.  It is OK to have someone drop you off and come back when you are ready to be discharged.    Special note: Every effort is made to have your procedure done on time. Please understand that emergencies sometimes delay scheduled procedures.  2. Diet: Do not eat solid foods after midnight.  The patient may have clear liquids until 5am upon the day of the procedure.  3. Labs: You will need to have blood drawn on Thursday, May 1 at [facility] (1st Floor), [address], [place], [place] [address]. You do not need to be fasting.  4. Medication instructions in preparation for your procedure:   Contrast Allergy: No    On the morning of your procedure, take your Aspirin  81 mg and any morning medicines NOT listed above.  You may use sips of water.  5. Plan to go home the same day, you will only stay overnight if medically necessary. 6. Bring a current list of your medications and current insurance cards. 7. You MUST have a responsible person to drive you home. 8. Someone MUST be with you the first 24 hours after you arrive home or your discharge will be delayed. 9. Please wear clothes that are easy to get on and off and wear slip-on shoes.  Thank you for allowing us  to care for you!   -- [facility]

## 2023-08-08 DIAGNOSIS — R079 Chest pain, unspecified: Secondary | ICD-10-CM | POA: Diagnosis not present

## 2023-08-08 DIAGNOSIS — I2510 Atherosclerotic heart disease of native coronary artery without angina pectoris: Secondary | ICD-10-CM | POA: Diagnosis not present

## 2023-08-08 DIAGNOSIS — E785 Hyperlipidemia, unspecified: Secondary | ICD-10-CM | POA: Diagnosis not present

## 2023-08-08 DIAGNOSIS — I10 Essential (primary) hypertension: Secondary | ICD-10-CM | POA: Diagnosis not present

## 2023-08-08 LAB — LIPID PANEL
Chol/HDL Ratio: 4.2 ratio (ref 0.0–4.4)
Cholesterol, Total: 161 mg/dL (ref 100–199)
HDL: 38 mg/dL — ABNORMAL LOW (ref >39)
LDL Chol Calc (NIH): 94 mg/dL (ref 0–99)
Triglycerides: 164 mg/dL — ABNORMAL HIGH (ref 0–149)
VLDL Cholesterol Cal: 29 mg/dL (ref 5–40)

## 2023-08-08 LAB — CBC

## 2023-08-09 LAB — BASIC METABOLIC PANEL WITH GFR
BUN/Creatinine Ratio: 26 — ABNORMAL HIGH (ref 9–23)
BUN: 18 mg/dL (ref 6–24)
CO2: 22 mmol/L (ref 20–29)
Calcium: 9.2 mg/dL (ref 8.7–10.2)
Chloride: 104 mmol/L (ref 96–106)
Creatinine, Ser: 0.69 mg/dL (ref 0.57–1.00)
Glucose: 110 mg/dL — ABNORMAL HIGH (ref 70–99)
Potassium: 4.3 mmol/L (ref 3.5–5.2)
Sodium: 141 mmol/L (ref 134–144)
eGFR: 101 mL/min/{1.73_m2} (ref >59)

## 2023-08-09 LAB — CBC
Hematocrit: 39.5 % (ref 34.0–46.6)
Hemoglobin: 12.7 g/dL (ref 11.1–15.9)
MCH: 26.8 pg (ref 26.6–33.0)
MCHC: 32.2 g/dL (ref 31.5–35.7)
MCV: 84 fL (ref 79–97)
Platelets: 339 10*3/uL (ref 150–450)
RBC: 4.73 x10E6/uL (ref 3.77–5.28)
RDW: 14 % (ref 11.7–15.4)
WBC: 5.9 10*3/uL (ref 3.4–10.8)

## 2023-08-10 ENCOUNTER — Encounter: Payer: Self-pay | Admitting: Family Medicine

## 2023-08-12 ENCOUNTER — Ambulatory Visit: Payer: Medicaid Other | Admitting: Dermatology

## 2023-08-12 ENCOUNTER — Encounter: Payer: Self-pay | Admitting: Cardiovascular Disease

## 2023-08-12 NOTE — Telephone Encounter (Signed)
Responded in separate message.

## 2023-08-13 ENCOUNTER — Telehealth: Payer: Self-pay | Admitting: *Deleted

## 2023-08-13 ENCOUNTER — Ambulatory Visit (HOSPITAL_BASED_OUTPATIENT_CLINIC_OR_DEPARTMENT_OTHER)

## 2023-08-13 ENCOUNTER — Other Ambulatory Visit: Payer: Self-pay

## 2023-08-13 DIAGNOSIS — E782 Mixed hyperlipidemia: Secondary | ICD-10-CM

## 2023-08-13 MED ORDER — ATORVASTATIN CALCIUM 80 MG PO TABS: 80.0000 mg | ORAL_TABLET | Freq: Every day | ORAL | 3 refills | Status: DC

## 2023-08-13 NOTE — Telephone Encounter (Signed)
 Cardiac Catheterization scheduled at [facility] for: Thursday Aug 15, 2023 10:30 AM Arrival time [facility] Main Entrance A: 8:30 AM  Nothing to eat after midnight prior to procedure, clear liquids until 5 AM day of procedure.  Medication instructions: -Hold:  Omesartan/amlodipine /hydrochlorothiazide - AM of procedure -Other usual morning medications can be taken with sips of water including aspirin  81 mg.  Plan to go home the same day, you will only stay overnight if medically necessary.  You must have responsible adult to drive you home.  Someone must be with you the first 24 hours after you arrive home.  Reviewed procedure instructions with patient.

## 2023-08-14 DIAGNOSIS — Z6829 Body mass index (BMI) 29.0-29.9, adult: Secondary | ICD-10-CM | POA: Diagnosis not present

## 2023-08-14 DIAGNOSIS — M5412 Radiculopathy, cervical region: Secondary | ICD-10-CM | POA: Diagnosis not present

## 2023-08-15 ENCOUNTER — Ambulatory Visit (HOSPITAL_COMMUNITY)
Admission: RE | Admit: 2023-08-15 | Discharge: 2023-08-15 | Disposition: A | Discharge status: Home / Self Care | Attending: Internal Medicine | Admitting: Internal Medicine

## 2023-08-15 ENCOUNTER — Encounter (HOSPITAL_COMMUNITY)
Admission: RE | Disposition: A | Discharge status: Home / Self Care | Payer: Self-pay | Source: Home / Self Care | Attending: Internal Medicine

## 2023-08-15 ENCOUNTER — Other Ambulatory Visit: Payer: Self-pay

## 2023-08-15 DIAGNOSIS — Z79899 Other long term (current) drug therapy: Secondary | ICD-10-CM | POA: Diagnosis not present

## 2023-08-15 DIAGNOSIS — K760 Fatty (change of) liver, not elsewhere classified: Secondary | ICD-10-CM | POA: Insufficient documentation

## 2023-08-15 DIAGNOSIS — R42 Dizziness and giddiness: Secondary | ICD-10-CM | POA: Insufficient documentation

## 2023-08-15 DIAGNOSIS — K219 Gastro-esophageal reflux disease without esophagitis: Secondary | ICD-10-CM | POA: Diagnosis not present

## 2023-08-15 DIAGNOSIS — E785 Hyperlipidemia, unspecified: Secondary | ICD-10-CM | POA: Insufficient documentation

## 2023-08-15 DIAGNOSIS — Z7982 Long term (current) use of aspirin: Secondary | ICD-10-CM | POA: Diagnosis not present

## 2023-08-15 DIAGNOSIS — I25110 Atherosclerotic heart disease of native coronary artery with unstable angina pectoris: Secondary | ICD-10-CM | POA: Diagnosis present

## 2023-08-15 DIAGNOSIS — G4733 Obstructive sleep apnea (adult) (pediatric): Secondary | ICD-10-CM | POA: Diagnosis not present

## 2023-08-15 DIAGNOSIS — K589 Irritable bowel syndrome without diarrhea: Secondary | ICD-10-CM | POA: Diagnosis not present

## 2023-08-15 DIAGNOSIS — I10 Essential (primary) hypertension: Secondary | ICD-10-CM | POA: Diagnosis not present

## 2023-08-15 DIAGNOSIS — I2510 Atherosclerotic heart disease of native coronary artery without angina pectoris: Secondary | ICD-10-CM

## 2023-08-15 DIAGNOSIS — F319 Bipolar disorder, unspecified: Secondary | ICD-10-CM | POA: Insufficient documentation

## 2023-08-15 HISTORY — PX: LEFT HEART CATH AND CORONARY ANGIOGRAPHY: CATH118249

## 2023-08-15 LAB — GLUCOSE, CAPILLARY: Glucose-Capillary: 137 mg/dL — ABNORMAL HIGH (ref 70–99)

## 2023-08-15 SURGERY — LEFT HEART CATH AND CORONARY ANGIOGRAPHY
Anesthesia: LOCAL

## 2023-08-15 MED ORDER — HYDRALAZINE HCL 20 MG/ML IJ SOLN: 10.0000 mg | INTRAMUSCULAR | Status: DC | PRN

## 2023-08-15 MED ORDER — VERAPAMIL HCL 2.5 MG/ML IV SOLN
INTRAVENOUS | Status: DC | PRN
  Administered 2023-08-15: 10 mL via INTRA_ARTERIAL

## 2023-08-15 MED ORDER — ACETAMINOPHEN 325 MG PO TABS: 650.0000 mg | ORAL_TABLET | ORAL | Status: DC | PRN

## 2023-08-15 MED ORDER — ASPIRIN 81 MG PO CHEW: 81.0000 mg | CHEWABLE_TABLET | ORAL | Status: DC

## 2023-08-15 MED ORDER — FENTANYL CITRATE (PF) 100 MCG/2ML IJ SOLN
INTRAMUSCULAR | Status: AC
  Filled 2023-08-15: qty 2

## 2023-08-15 MED ORDER — SODIUM CHLORIDE 0.9 % IV SOLN: 250.0000 mL | INTRAVENOUS | Status: DC | PRN

## 2023-08-15 MED ORDER — SODIUM CHLORIDE 0.9 % IV SOLN: INTRAVENOUS | Status: DC

## 2023-08-15 MED ORDER — ISOSORBIDE MONONITRATE ER 30 MG PO TB24: 30.0000 mg | ORAL_TABLET | Freq: Every day | ORAL | 11 refills | Status: DC

## 2023-08-15 MED ORDER — MIDAZOLAM HCL 2 MG/2ML IJ SOLN
INTRAMUSCULAR | Status: AC
  Filled 2023-08-15: qty 2

## 2023-08-15 MED ORDER — ONDANSETRON HCL 4 MG/2ML IJ SOLN: 4.0000 mg | Freq: Four times a day (QID) | INTRAMUSCULAR | Status: DC | PRN

## 2023-08-15 MED ORDER — LIDOCAINE HCL (PF) 1 % IJ SOLN
INTRAMUSCULAR | Status: AC
  Filled 2023-08-15: qty 30

## 2023-08-15 MED ORDER — IOHEXOL 350 MG/ML SOLN
INTRAVENOUS | Status: DC | PRN
  Administered 2023-08-15: 40 mL via INTRA_ARTERIAL

## 2023-08-15 MED ORDER — HEPARIN (PORCINE) IN NACL 1000-0.9 UT/500ML-% IV SOLN
INTRAVENOUS | Status: DC | PRN
  Administered 2023-08-15: 1000 mL via INTRA_ARTERIAL

## 2023-08-15 MED ORDER — FENTANYL CITRATE (PF) 100 MCG/2ML IJ SOLN
INTRAMUSCULAR | Status: DC | PRN
  Administered 2023-08-15: 25 ug via INTRAVENOUS

## 2023-08-15 MED ORDER — SODIUM CHLORIDE 0.9% FLUSH: 3.0000 mL | Freq: Two times a day (BID) | INTRAVENOUS | Status: DC

## 2023-08-15 MED ORDER — HEPARIN SODIUM (PORCINE) 1000 UNIT/ML IJ SOLN
INTRAMUSCULAR | Status: DC | PRN
  Administered 2023-08-15: 4000 [IU] via INTRAVENOUS

## 2023-08-15 MED ORDER — SODIUM CHLORIDE 0.9% FLUSH: 3.0000 mL | INTRAVENOUS | Status: DC | PRN

## 2023-08-15 MED ORDER — SODIUM CHLORIDE 0.9 % WEIGHT BASED INFUSION: 3.0000 mL/kg/h | INTRAVENOUS | Status: AC

## 2023-08-15 MED ORDER — HEPARIN SODIUM (PORCINE) 1000 UNIT/ML IJ SOLN
INTRAMUSCULAR | Status: AC
  Filled 2023-08-15: qty 10

## 2023-08-15 MED ORDER — SODIUM CHLORIDE 0.9 % WEIGHT BASED INFUSION: 1.0000 mL/kg/h | INTRAVENOUS | Status: DC

## 2023-08-15 MED ORDER — MIDAZOLAM HCL 2 MG/2ML IJ SOLN
INTRAMUSCULAR | Status: DC | PRN
  Administered 2023-08-15: 2 mg via INTRAVENOUS

## 2023-08-15 MED ORDER — LABETALOL HCL 5 MG/ML IV SOLN: 10.0000 mg | INTRAVENOUS | Status: DC | PRN

## 2023-08-15 MED ORDER — VERAPAMIL HCL 2.5 MG/ML IV SOLN
INTRAVENOUS | Status: AC
  Filled 2023-08-15: qty 2

## 2023-08-15 MED ORDER — LIDOCAINE HCL (PF) 1 % IJ SOLN
INTRAMUSCULAR | Status: DC | PRN
  Administered 2023-08-15: 2 mL

## 2023-08-15 SURGICAL SUPPLY — 10 items
CATH 5FR JL3.5 JR4 ANG PIG MP (CATHETERS) IMPLANT
CATH INFINITI 5FR JL4 (CATHETERS) IMPLANT
DEVICE RAD COMP TR BAND LRG (VASCULAR PRODUCTS) IMPLANT
GLIDESHEATH SLEND SS 6F .021 (SHEATH) IMPLANT
GUIDEWIRE INQWIRE 1.5J.035X260 (WIRE) IMPLANT
KIT SINGLE USE MANIFOLD (KITS) IMPLANT
PACK CARDIAC CATHETERIZATION (CUSTOM PROCEDURE TRAY) ×2 IMPLANT
SET ATX-X65L (MISCELLANEOUS) IMPLANT
SHEATH PROBE COVER 6X72 (BAG) IMPLANT
WIRE HI TORQ VERSACORE-J 145CM (WIRE) IMPLANT

## 2023-08-15 NOTE — Progress Notes (Signed)
 Patient came to short stay with armboard on R arm. MD End okay with patient discharging with this arm board and keeping it on for 24 hours as a reminder to keep arm immobile. This RN will loosen bandage so it is not as tight on arm and educate patient on removal and postop complications to look out for.

## 2023-08-15 NOTE — Discharge Instructions (Signed)

## 2023-08-15 NOTE — Interval H&P Note (Signed)
 History and Physical Interval Note:  08/15/2023 10:34 AM  Bailey Hooper  has presented today for surgery, with the diagnosis of coronary artery disease with unstable angina and abnormal coronary CTA.  The various methods of treatment have been discussed with the patient and family. After consideration of risks, benefits and other options for treatment, the patient has consented to  Procedure(s): LEFT HEART CATH AND CORONARY ANGIOGRAPHY (N/A) as a surgical intervention.  The patient's history has been reviewed, patient examined, no change in status, stable for surgery.  I have reviewed the patient's chart and labs.  Questions were answered to the patient's satisfaction.    Cath Lab Visit (complete for each Cath Lab visit)  Clinical Evaluation Leading to the Procedure:   ACS: No.  Non-ACS:    Anginal Classification: CCS IV  Anti-ischemic medical therapy: Minimal Therapy (1 class of medications)  Non-Invasive Test Results: High-risk test findings: cardiac mortality >3%/year; coronary CTA with significant LCx and RCA disease; borderline significant LAD disease by CT-FFR  Prior CABG: No previous CABG  Bailey Hooper

## 2023-08-15 NOTE — Brief Op Note (Signed)
 BRIEF CARDIAC CATHETERIZATION NOTE  DATE: 08/15/2023  TIME: 11:32 AM  PATIENT:  Bailey Hooper  58 y.o. 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 Upper normal LVEDP.  RECOMMENDATIONS: Escalate antianginal therapy; will add Imdur 30 mg daily. Review case at [facility] meeting.  No favorable PCI targets given multifocal disease, severe tortuosity, and anomalous RCA.  Sammy Crisp, MD [facility]

## 2023-08-16 ENCOUNTER — Encounter (HOSPITAL_COMMUNITY): Payer: Self-pay | Admitting: Internal Medicine

## 2023-08-16 ENCOUNTER — Ambulatory Visit: Payer: Self-pay | Admitting: Emergency Medicine

## 2023-08-16 NOTE — Heart Team MDD (Signed)
   Heart Team Multi-Disciplinary Discussion  Patient: Bailey Hooper  DOB: 1965/11/06  MRN: [id number]   Date: 08/16/2023  12:03 PM    Attendees: Interventional Cardiology: Randene Bustard, MD Fransico Ivy, MD Alyssa Backbone, MD Sammy Crisp, MD Burney Carter, MD Antionette Kirks, MD  Cardiothoracic Surgery: Starleen Eastern, MD     Patient History: 58 y.o. female with a history of multivessel CAD with positive FFR on recent coronary CTA on 07/26/2023. C/o intermittent chest pain/heaviness and shortness of breath with activity and resolves with rest.  Pt has resistant HTN, however admits to non-compliance of medication regimen along with non-compliance with use of CPAP machine.          Risk Factors: Hypertension Hyperlipidemia Tobacco Abuse Additional Risk Factors: OSA, GERD, non-alcoholic fatty liver disease, IBS, migraines, anxiety/depression, bipolar disorder   CAD History: Coronary CTA showed a coronary calcium  score of 331 with at least moderate multivessel CAD with abnormal FFR in RCA and LCX and borderline positive FFR in LAD   Review of Prior Angiography and PCI Procedures: Left heart cath and coronary angiography images from 08/15/2023 were reviewed and discussed in detail including: Moderate-severe multivessel CAD, including 70% proximal LAD, 80% D1, multifocal LCx disease of up to 70-80%, and 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   Discussion: After presentation, consideration of treatment options occurred contrasting CABG versus medical therapy as there are no favorable PCI targets given multifocal disease, severe tortuosity, and anomalous RCA. Consensus from the team was for medical therapy with possible consideration for renal denervation for resistant hypertension. If pain continues after medical therapy has been exhausted and hypertension has been controlled then consider referral  for CABG.    Recommendations: Medical Therapy      Andreas Kays, RN  08/16/2023 12:03 PM

## 2023-08-18 ENCOUNTER — Other Ambulatory Visit (HOSPITAL_BASED_OUTPATIENT_CLINIC_OR_DEPARTMENT_OTHER)

## 2023-08-20 ENCOUNTER — Ambulatory Visit (HOSPITAL_BASED_OUTPATIENT_CLINIC_OR_DEPARTMENT_OTHER)

## 2023-08-20 ENCOUNTER — Encounter: Payer: Self-pay | Admitting: Family Medicine

## 2023-08-22 ENCOUNTER — Ambulatory Visit: Payer: Medicaid Other | Admitting: Podiatry

## 2023-08-22 ENCOUNTER — Ambulatory Visit (INDEPENDENT_AMBULATORY_CARE_PROVIDER_SITE_OTHER): Admitting: Family Medicine

## 2023-08-22 VITALS — BP 112/77 | HR 63 | Temp 98.8°F | Ht 66.0 in | Wt 183.2 lb

## 2023-08-22 DIAGNOSIS — R232 Flushing: Secondary | ICD-10-CM

## 2023-08-22 DIAGNOSIS — J0101 Acute recurrent maxillary sinusitis: Secondary | ICD-10-CM | POA: Diagnosis not present

## 2023-08-22 MED ORDER — AMOXICILLIN-POT CLAVULANATE 875-125 MG PO TABS: 1.0000 | ORAL_TABLET | Freq: Two times a day (BID) | ORAL | 0 refills | Status: AC

## 2023-08-22 NOTE — Patient Instructions (Signed)
 I have prescribed antibiotics for your sinus infection. I will chat with Dr. Bevin Bucks about your hot flashes and other medications we could try. Be sure to follow up with her soon!

## 2023-08-22 NOTE — Assessment & Plan Note (Addendum)
 Continues to worsen.  She has tried estrogen therapy as well as gabapentin .  Interested in [place] though unfortunate contraindicated due to NAFLD.  PCP Dr. Bevin Bucks saw patient in room and will further look into options for patient's hot flashes.

## 2023-08-22 NOTE — Progress Notes (Signed)
    SUBJECTIVE:   CHIEF COMPLAINT / HPI:   Sick symptoms Has had a headache since Saturday. Has chronic sinus problems. She is having yellow sputum and mucus now. Throat has also been sore for the last 2 days. Has been taking OTC sinus pressure medications but this has not been helping. Has also been using allergy nasal spray and Singulair . She has used Augmentin  in the past without adverse effects and feels this would be best for her today.  Hot flashes Has been going on for a while now. She brings her fan with her to the appointment today. She has tried estrogen but is worried about her risk of clotting. She does not think gabapentin  works well.  PERTINENT  PMH / PSH: Migraine, hypertension, NAFLD, GERD, BPPV, tobacco use, MDD, bipolar disorder  OBJECTIVE:   BP 112/77   Pulse 63   Temp 98.8 F (37.1 C) (Oral)   Ht 5\' 6"  (1.676 m)   Wt 183 lb 4 oz (83.1 kg)   LMP 02/12/2017 Comment: not sexually active  SpO2 99%   BMI 29.58 kg/m   General: Alert and oriented, in NAD Skin: Warm, dry, and intact without lesions HEENT: NCAT, EOM grossly normal, midline nasal septum Cardiac: RRR, no m/r/g appreciated Respiratory: CTAB, breathing and speaking comfortably on RA Extremities: Moves all extremities grossly equally Neurological: No gross focal deficit.  ASSESSMENT/PLAN:   Assessment & Plan Acute recurrent maxillary sinusitis In the setting of chronic sinus disease.  Reassuringly without focal respiratory symptoms. Given recurrence and presence of thick yellow mucus, we will send in 7-day course of Augmentin .  Discussed continuing allergy medications.  Advised have close follow-up with PCP. Hot flashes Continues to worsen.  She has tried estrogen therapy as well as gabapentin .  Interested in [place] though unfortunate contraindicated due to NAFLD.  PCP Dr. Bevin Bucks saw patient in room and will further look into options for patient's hot flashes.   Genetta Kenning, MD [facility]

## 2023-08-30 ENCOUNTER — Ambulatory Visit: Admitting: Podiatry

## 2023-09-03 ENCOUNTER — Ambulatory Visit (INDEPENDENT_AMBULATORY_CARE_PROVIDER_SITE_OTHER): Admitting: Clinical

## 2023-09-03 DIAGNOSIS — F3132 Bipolar disorder, current episode depressed, moderate: Secondary | ICD-10-CM

## 2023-09-03 DIAGNOSIS — F411 Generalized anxiety disorder: Secondary | ICD-10-CM

## 2023-09-03 NOTE — Progress Notes (Signed)
 Virtual Visit via Video Note   I connected with Bailey Hooper on 09/03/23 at  2:00 PM EDT by a video enabled telemedicine application and verified that I am speaking with the correct person using two identifiers.   Location: Patient: home Provider: office   I discussed the limitations of evaluation and management by telemedicine and the availability of in person appointments. The patient expressed understanding and agreed to proceed.   THERAPIST PROGRESS NOTE   Session Time: 2:00 PM-2:40 PM   Participation Level: Active   Behavioral Response: CasualAlertAnxious   Type of Therapy: Individual Therapy   Treatment Goals addressed: Coping   Interventions: CBT   Summary: Bailey Hooper is a 58 y.o. female who presents with Bipolar Disorder./ GAD. The OPT therapist worked with the patient for her scheduled OPT session. The OPT therapist utilized Motivational Interviewing to assist in creating therapeutic repore. The patient in the session was engaged and work in collaboration giving feedback about her triggers and symptoms over the past few weeks. The patient spoke about results of her recent Cardio-testing.  Additionally the patient spoke about start PT tomorrow for her neck. The patient spoke about working to be more consistent with her medication. The OPT therapist utilized Cognitive Behavioral Therapy through cognitive restructuring as well as worked with the patient on coping strategies to assist in management of mood and as she continues to work on family interactions,  finances, physical and mental health. The patient spoke about frustration with her relationship with her Mother and involvement in caregiving and taking to the Doctor and conflict from her being honest with her Mothers doctor. The patient spoke about working on her awareness of where her stress boundaries are and being aware of when she needs to implement coping to create balance in her life. The patient spoke about her ongoing  disability case. The patient spoke about trying to find a balance between her health needs and her work hours to manage rising costs.   Suicidal/Homicidal: Nowithout intent/plan   Therapist Response: The OPT therapist worked with the patient for the patients scheduled session. The patient was engaged in her session and gave feedback in relation to triggers, symptoms, and behavior responses over the past few weeks. The OPT therapist worked with the patient utilizing an in session Cognitive Behavioral Therapy exercise. The patient was responsive in the session and verbalized, " I have been trying to take it easy since I have this thing going on with my heart and I didn't do anything over the [facility] Day Weekend". The OPT therapist worked with the patient on managing her own individual health and being mindful of her own basic care needs including eating, sleeping, exercise, and hygenie. The patient identified her need to focus on herself and realization she cannot work outside of what is in her control. The patient spoke about working to not overwhelm herself and or by being overwhelmed by committing to family request. The patient spoke about her physical health concerns and this amplifying her need to manage her stressors. The patient spoke about her ongoing involvement with health specialist including her Cardiologist and follow up post her recent surgery procedure.The OPT therapist continued to work with the patient on mindfulness DBT and her work/life balance while promoting the patient continuing to follow the directives of her health professionals.The OPT therapist will continue treatment work with the patient in her next scheduled session.   Plan: Return again in 3 weeks.   Diagnosis:  Axis I: Bipolar Disorder/ GAD                             Axis II: No diagnosis   Collaboration of Care: No additional collaboration of care for this session.    Patient/Guardian was advised Release of  Information must be obtained prior to any record release in order to collaborate their care with an outside provider. Patient/Guardian was advised if they have not already done so to contact the registration department to sign all necessary forms in order for us  to release information regarding their care.    Consent: Patient/Guardian gives verbal consent for treatment and assignment of benefits for services provided during this visit. Patient/Guardian expressed understanding and agreed to proceed      I discussed the assessment and treatment plan with the patient. The patient was provided an opportunity to ask questions and all were answered. The patient agreed with the plan and demonstrated an understanding of the instructions.   The patient was advised to call back or seek an in-person evaluation if the symptoms worsen or if the condition fails to improve as anticipated.   I provided 40 minutes of non-face-to-face time during this encounter.   Lea Primmer, LCSW   09/03/2023

## 2023-09-04 ENCOUNTER — Encounter: Payer: Self-pay | Admitting: Physical Therapy

## 2023-09-04 ENCOUNTER — Ambulatory Visit: Attending: Neurosurgery | Admitting: Physical Therapy

## 2023-09-04 ENCOUNTER — Other Ambulatory Visit: Payer: Self-pay

## 2023-09-04 DIAGNOSIS — M542 Cervicalgia: Secondary | ICD-10-CM | POA: Insufficient documentation

## 2023-09-04 DIAGNOSIS — M6281 Muscle weakness (generalized): Secondary | ICD-10-CM | POA: Insufficient documentation

## 2023-09-04 NOTE — Therapy (Signed)
 OUTPATIENT PHYSICAL THERAPY CERVICAL EVALUATION   Patient Name: Bailey Hooper MRN: [id number] DOB:[birth year], 58 y.o., female Today's Date: 09/04/2023  END OF SESSION:  PT End of Session - 09/04/23 1436     Visit Number 1    Number of Visits 17    Date for PT Re-Evaluation 10/30/23    Authorization Type [employer]    Authorization Time Period auth after 27VL    PT Start Time 1438    PT Stop Time 1524    PT Time Calculation (min) 46 min    Activity Tolerance Patient tolerated treatment well             Past Medical History:  Diagnosis Date   Allergy    Anxiety    Arthritis    Asthma    Depression    Fibroids    GERD (gastroesophageal reflux disease)    Hiatal hernia    High cholesterol    Hypertension    IBS (irritable bowel syndrome)    Migraines    Non-alcoholic fatty liver disease    Prediabetes    Sleep apnea    cpap   Tobacco use    Vertigo    Past Surgical History:  Procedure Laterality Date   CESAREAN SECTION     COLONOSCOPY     FOOT SURGERY     Left foot   LEFT HEART CATH AND CORONARY ANGIOGRAPHY N/A 08/15/2023   Procedure: LEFT HEART CATH AND CORONARY ANGIOGRAPHY;  Surgeon: Sammy Crisp, MD;  Location: [facility];  Service: Cardiovascular;  Laterality: N/A;   Patient Active Problem List   Diagnosis Date Noted   Family history of dementia 08/16/2021   NAFLD (nonalcoholic fatty liver disease) 62/13/0865   H/O colonoscopy 06/16/2020   Hot flashes 04/27/2019   Bipolar disorder (HCC) 01/18/2019   Asthma 01/18/2019   Hyperlipidemia 07/14/2018   Tobacco abuse 11/29/2017   Benign paroxysmal positional vertigo 11/29/2017   Major depressive disorder, recurrent episode, moderate (HCC) 06/28/2009   GENITAL HERPES 01/17/2009   Migraine headache 01/17/2009   Essential hypertension, benign 01/17/2009   GERD (gastroesophageal reflux disease) 01/17/2009   Irritable bowel syndrome 01/17/2009   Heart murmur 01/17/2009    PCP: Azell Boll, MD  REFERRING PROVIDER: Agustina Aldrich, MD  REFERRING DIAG: [phone] (ICD-10-CM) - Radiculopathy, cervical region  THERAPY DIAG:  Cervicalgia - Plan: PT plan of care cert/re-cert  Muscle weakness (generalized) - Plan: PT plan of care cert/re-cert  Rationale for Evaluation and Treatment: Rehabilitation  ONSET DATE: ~2021 initially, this episode February/March  SUBJECTIVE:  SUBJECTIVE STATEMENT: Pt states she has had excruciating pain/numbness in UE, shooting. Pt states first episode occurred in 2021, received work up and resolved after a couple weeks. Occurred again in 2023, self resolved after a couple weeks.  Pt states this most recent episode began in February/march and has essentially resolved as far as UE symptoms go. States she does still have some neck/shoulder pain. Thinks it may be provoked by lifting. States sometimes she will get headaches with neck pain, has history of migraines (since early adulthood).  She notes migraines actually seem to be improving over past couple of years. States pain will sometimes affect sleeping, difficulty positioning for sleep, side sleeping. States she was getting OT for her hand, states this is the same issue and she was told it's coming from her neck. Hand dominance: Right  PERTINENT HISTORY:  chest pain w/ heart cath 08/15/23; asthma, anxiety/depression, HTN, IBS, migraines, GERD Pt states chest pain relieved by sublingual medication  PAIN:  Are you having pain: 3/10 Location/description: neck, more R sided; sometimes has UE symptoms into hand Best-worst over past week: 2-6/10  - aggravating factors: lifting - otherwise difficult to describe - Easing factors:  heating pad, tylenol   PRECAUTIONS: recent cardiac hx  RED FLAGS: Reports  dizziness and chest pain at times, both of which are reportedly resolved by sublingual medication - currently undergoing cardiology work up No difficulty swallowing, no double vision, no bowel/bladder changes. N/T as described above which have occurred on both extremities but typically episodic with one at a time. Chronic headaches that are occasionally coincident with neck pain and numbness, but reportedly improving over last few years. Reports history of vertigo with head movement and sup<>sit    WEIGHT BEARING RESTRICTIONS: No  FALLS:  Has patient fallen in last 6 months? 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  DIAGNOSTIC FINDINGS:  07/19/23 cervical MRI: "IMPRESSION: 1. 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 2. Moderate left foraminal stenosis at C5-C6 secondary to uncovertebral joint disease. 3. No high-grade canal stenosis."  PATIENT SURVEYS:  NDI: 29/50 ; 58%   COGNITION: Overall cognitive status: Within functional limits for tasks assessed  SENSATION/NEURO: Light touch intact BIL UE, mildly reduced L C4-5 and R C7 Finger<>nose testing unremarkable Negative hoffmann and tromner sign BIL No ataxia with gait  POSTURE: rounded shoulders, mild lean to L   CERVICAL ROM:   ROM A/PROM (deg) eval  Flexion 65 deg   Extension 25 deg (transiently dizzy, she attributes to chronic vertigo)   Right lateral flexion 30 deg discomfort  Left lateral flexion 30 deg discomfort  Right rotation   Left rotation    (Blank rows = not tested) (Key: WFL = within functional limits not formally assessed, * = concordant pain, s = stiffness/stretching  sensation, NT = not tested) Comment:   UPPER EXTREMITY ROM:  A/PROM Right eval Left eval  Shoulder flexion    Shoulder abduction    Shoulder internal rotation    Shoulder external rotation    Elbow flexion    Elbow extension    Wrist flexion    Wrist extension     (Blank rows = not tested) (Key: WFL = within functional limits not formally assessed, * =  concordant pain, s = stiffness/stretching sensation, NT = not tested)  Comments:    UPPER EXTREMITY MMT:  MMT Right eval Left eval  Shoulder flexion 4 * 5  Shoulder extension    Shoulder abduction 5 5  Shoulder extension    Shoulder internal rotation    Shoulder external rotation    Elbow flexion 4 4  Elbow extension 3+ 4+  Grip strength 30# 40#  (Blank rows = not tested)  (Key: WFL = within functional limits not formally assessed, * = concordant pain, s = stiffness/stretching sensation, NT = not tested)  Comments:   CERVICAL SPECIAL TESTS:  deferred  FUNCTIONAL TESTS:  See grip strength above  TREATMENT DATE:  [facility] Adult PT Treatment:                                                DATE: 09/04/23 Therapeutic Exercise: Scapular retraction; double ER + scap retraction; practice reps, HEP handout + education on appropriate performance and modification as indicated                                                                                                                            PATIENT EDUCATION:  Education details: Pt education on PT impairments, prognosis, and POC. Informed consent. Rationale for interventions, safe/appropriate HEP performance Person educated: Patient Education method: Explanation, Demonstration, Tactile cues, Verbal cues Education comprehension: verbalized understanding, returned demonstration, verbal cues required, tactile cues required, and needs further education    HOME EXERCISE PROGRAM: Access Code: [email or web address] URL: [email or web address] Date: 09/04/2023 Prepared by:  Mayme Spearman  Exercises - Seated Scapular Retraction  - 2-3 x daily - 1 sets - 8-10 reps - Shoulder External Rotation and Scapular Retraction  - 2-3 x daily - 1 sets - 8-10 reps  ASSESSMENT:  CLINICAL IMPRESSION: Patient is a pleasant 58 y.o. woman who was seen today for physical therapy evaluation and treatment for neck pain/UE pain. Symptoms have fluctuated since ~2021 per pt report - tend to be exacerbated by lifting and neck movement. Does not endorse any cervical red flags, ongoing cardiac work up (refer to [facility] for details). On exam she demonstrates mild limitations in cervical mobility, good GH mobility, reduced RUE strength, and sensory impairments in UE. Tolerates exam/HEP well overall without increase in resting pain, no adverse events. Recommend trial of skilled PT to address aforementioned deficits with aim of improving functional tolerance and reducing pain with typical activities. Pt departs today's session in no acute distress, all voiced concerns/questions addressed appropriately from PT perspective.     OBJECTIVE IMPAIRMENTS: decreased activity tolerance, decreased endurance, decreased mobility, decreased ROM, decreased strength, impaired perceived functional ability, impaired UE functional use, improper body mechanics, postural dysfunction, and pain.   ACTIVITY LIMITATIONS: carrying, lifting, and sleeping  PARTICIPATION LIMITATIONS: meal prep, cleaning, laundry, community activity, and occupation  PERSONAL FACTORS: Time since onset of injury/illness/exacerbation and 3+ comorbidities: chest pain w/ heart cath 08/15/23; asthma, anxiety/depression, HTN, IBS, migraines, GERD are also affecting patient's functional outcome.   REHAB POTENTIAL: Fair given chronicity and comorbidities  CLINICAL DECISION MAKING: Evolving/moderate complexity  EVALUATION COMPLEXITY: Moderate   GOALS:   SHORT TERM GOALS: Target date: 10/02/2023  Pt will demonstrate appropriate understanding and  performance of initially prescribed HEP in order to facilitate improved independence with management of symptoms.  Baseline: HEP established  Goal status: INITIAL   2. Pt will report at least 25% improvement in overall pain levels over past week in order to facilitate improved tolerance to typical daily activities.   Baseline: 2-6/10  Goal status: INITIAL    LONG TERM GOALS: Target date: 10/30/2023  Pt will score less than or equal to 19/50 on NDI in order to demonstrate improved perception of function due to symptoms ([facility] 10-13 pts per Adline Hook al 2009, 2010). Baseline: 29/50 Goal status: INITIAL  2. Pt will report at least 50% improvement with work activities in regards to neck/UE pain. Baseline: reports difficulty/pain with work tasks Goal status: INITIAL  3. Pt will demonstrate grossly symmetrical shoulder/elbow MMT for improved symmetry of UE strength and improved tolerance to functional movements.  Baseline: see MMT chart above Goal status: INITIAL   4. Pt will demonstrate at least 40# of grip strength on RUE in order to facilitate improved functional strengthening. Baseline: see MMT chart above Goal status: INITIAL   5. Pt will report at least 50% decrease in overall pain levels in past week in order to facilitate improved tolerance to basic ADLs/mobility.   Baseline: 2-6/10  Goal status: INITIAL     PLAN:  PT FREQUENCY: 1-2x/week  PT DURATION: 8 weeks  PLANNED INTERVENTIONS: 97164- PT Re-evaluation, 97750- Physical Performance Testing, 97110-Therapeutic exercises, 97530- Therapeutic activity, 97112- Neuromuscular re-education, 97535- Self Care, 13244- Manual therapy, Taping, Dry Needling, Joint mobilization, Spinal mobilization, Cryotherapy, and Moist heat  PLAN FOR NEXT SESSION: Review/update HEP PRN. Work on [employer] exercises as appropriate with emphasis on postural endurance, RUE strengthening, cervical mobility mindful of vertigo/dizziness hx. Symptom  modification strategies as indicated/appropriate. Also mindful of ongoing cardiac work up - on eval pt states she plans to discuss exercise restrictions with cardiology at their visit next week   Lovett Ruck PT, DPT 09/04/2023 5:37 PM

## 2023-09-06 ENCOUNTER — Ambulatory Visit (HOSPITAL_COMMUNITY)
Admission: RE | Admit: 2023-09-06 | Discharge: 2023-09-06 | Disposition: A | Discharge status: Home / Self Care | Source: Ambulatory Visit | Attending: Cardiology | Admitting: Cardiology

## 2023-09-06 DIAGNOSIS — R0789 Other chest pain: Secondary | ICD-10-CM

## 2023-09-06 LAB — ECHOCARDIOGRAM COMPLETE
Area-P 1/2: 2.44 cm2
S' Lateral: 2.2 cm

## 2023-09-08 ENCOUNTER — Ambulatory Visit: Payer: Self-pay | Admitting: Student

## 2023-09-09 ENCOUNTER — Other Ambulatory Visit (HOSPITAL_BASED_OUTPATIENT_CLINIC_OR_DEPARTMENT_OTHER): Payer: Self-pay | Admitting: *Deleted

## 2023-09-09 ENCOUNTER — Ambulatory Visit (HOSPITAL_BASED_OUTPATIENT_CLINIC_OR_DEPARTMENT_OTHER): Admitting: Family

## 2023-09-09 ENCOUNTER — Other Ambulatory Visit (HOSPITAL_BASED_OUTPATIENT_CLINIC_OR_DEPARTMENT_OTHER)

## 2023-09-09 ENCOUNTER — Encounter (HOSPITAL_BASED_OUTPATIENT_CLINIC_OR_DEPARTMENT_OTHER): Payer: Self-pay | Admitting: *Deleted

## 2023-09-09 ENCOUNTER — Encounter (HOSPITAL_BASED_OUTPATIENT_CLINIC_OR_DEPARTMENT_OTHER): Payer: Self-pay | Admitting: Nurse Practitioner

## 2023-09-09 ENCOUNTER — Ambulatory Visit (HOSPITAL_BASED_OUTPATIENT_CLINIC_OR_DEPARTMENT_OTHER): Admitting: Nurse Practitioner

## 2023-09-09 VITALS — BP 126/68 | HR 70 | Ht 66.0 in | Wt 183.7 lb

## 2023-09-09 DIAGNOSIS — R002 Palpitations: Secondary | ICD-10-CM

## 2023-09-09 DIAGNOSIS — E782 Mixed hyperlipidemia: Secondary | ICD-10-CM

## 2023-09-09 DIAGNOSIS — I10 Essential (primary) hypertension: Secondary | ICD-10-CM

## 2023-09-09 DIAGNOSIS — Z720 Tobacco use: Secondary | ICD-10-CM

## 2023-09-09 DIAGNOSIS — I2510 Atherosclerotic heart disease of native coronary artery without angina pectoris: Secondary | ICD-10-CM | POA: Diagnosis not present

## 2023-09-09 DIAGNOSIS — I517 Cardiomegaly: Secondary | ICD-10-CM

## 2023-09-09 DIAGNOSIS — E785 Hyperlipidemia, unspecified: Secondary | ICD-10-CM

## 2023-09-09 DIAGNOSIS — R7303 Prediabetes: Secondary | ICD-10-CM

## 2023-09-09 DIAGNOSIS — G4733 Obstructive sleep apnea (adult) (pediatric): Secondary | ICD-10-CM | POA: Diagnosis not present

## 2023-09-09 NOTE — Patient Instructions (Addendum)
 Medication Instructions:   Your physician recommends that you continue on your current medications as directed. Please refer to the Current Medication list given to you today.   *If you need a refill on your cardiac medications before your next appointment, please call your pharmacy*  Lab Work:  Your physician recommends that you return for a FASTING lipid profile/ALT/A1C fasting after midnight in July. Paperwork given to pt today.    If you have labs (blood work) drawn today and your tests are completely normal, you will receive your results only by: MyChart Message (if you have MyChart) OR A paper copy in the mail If you have any lab test that is abnormal or we need to change your treatment, we will call you to review the results.  Testing/Procedures:  WatchPAT?  Is a FDA cleared portable home sleep study test that uses a watch and 3 points of contact to monitor 7 different channels, including your heart rate, oxygen saturations, body position, snoring, and chest motion.  The study is easy to use from the comfort of your own home and accurately detect sleep apnea.  Before bed, you attach the chest sensor, attached the sleep apnea bracelet to your nondominant hand, and attach the finger probe.  After the study, the raw data is downloaded from the watch and scored for apnea events.   For more information: [email or web address]  Patient Testing Instructions:  Do not put battery into the device until bedtime when you are ready to begin the test. Please call the support number if you need assistance after following the instructions below: 24 hour support line- [phone] or [facility] support at [phone] (option 2)  Download the IntelWatchPAT One" app through the google play store or App Store  Be sure to turn on or enable access to bluetooth in settlings on your smartphone/ device  Make sure no other bluetooth devices are on and within the vicinity of your  smartphone/ device and WatchPAT watch during testing.  Make sure to leave your smart phone/ device plugged in and charging all night.  When ready for bed:  Follow the instructions step by step in the WatchPAT One App to activate the testing device. For additional instructions, including video instruction, visit the WatchPAT One video on Youtube. You can search for WatchPat One within Youtube (video is 4 minutes and 18 seconds) or enter: https://youtube/watch?[email or web address] Please note: You will be prompted to enter a Pin to connect via bluetooth when starting the test. The PIN will be assigned to you when you receive the test.  The device is disposable, but it recommended that you retain the device until you receive a call letting you know the study has been received and the results have been interpreted.  We will let you know if the study did not transmit to us  properly after the test is completed. You do not need to call us  to confirm the receipt of the test.  Please complete the test within 48 hours of receiving PIN.   Frequently Asked Questions:  What is Watch Deatra Face one?  A single use fully disposable home sleep apnea testing device and will not need to be returned after completion.  What are the requirements to use WatchPAT one?  The be able to have a successful watchpat one sleep study, you should have your Watch pat one device, your smart phone, watch pat one app, your PIN number and Internet access What type of phone do I need?  You should have a smart  phone that uses Android 5.1 and above or any Iphone with IOS 10 and above How can I download the WatchPAT one app?  Based on your device type search for WatchPAT one app either in google play for android devices or APP store for Iphone's Where will I get my PIN for the study?  Your PIN will be provided by your physician's office. It is used for authentication and if you lose/forget your PIN, please reach out to your providers office.  I do not  have Internet at home. Can I do WatchPAT one study?  WatchPAT One needs Internet connection throughout the night to be able to transmit the sleep data. You can use your home/local internet or your cellular's data package. However, it is always recommended to use home/local Internet. It is estimated that between 20MB-30MB will be used with each study.However, the application will be looking for space in the phone to start the study.  What happens if I lose internet or bluetooth connection?  During the internet disconnection, your phone will not be able to transmit the sleep data. All the data, will be stored in your phone. As soon as the internet connection is back on, the phone will being sending the sleep data. During the bluetooth disconnection, WatchPAT one will not be able to to send the sleep data to your phone. Data will be kept in the WatchPAT one until two devices have bluetooth connection back on. As soon as the connection is back on, WatchPAT one will send the sleep data to the phone.  How long do I need to wear the WatchPAT one?  After you start the study, you should wear the device at least 6 hours.  How far should I keep my phone from the device?  During the night, your phone should be within 15 feet.  What happens if I leave the room for restroom or other reasons?  Leaving the room for any reason will not cause any problem. As soon as your get back to the room, both devices will reconnect and will continue to send the sleep data. Can I use my phone during the sleep study?  Yes, you can use your phone as usual during the study. But it is recommended to put your watchpat one on when you are ready to go to bed.  How will I get my study results?  A soon as you completed your study, your sleep data will be sent to the provider. They will then share the results with you when they are ready.   ZIO XT- Long Term Monitor Instructions  Your physician has requested you wear a ZIO patch  monitor for 14 days.  This is a single patch monitor. [employer] supplies one patch monitor per enrollment. Additional stickers are not available. Please do not apply patch if you will be having a Nuclear Stress Test,  Echocardiogram, Cardiac CT, MRI, or Chest Xray during the period you would be wearing the  monitor. The patch cannot be worn during these tests. You cannot remove and re-apply the  ZIO XT patch monitor.  Your ZIO patch monitor will be mailed 3 day USPS to your address on file. It may take 3-5 days  to receive your monitor after you have been enrolled.  Once you have received your monitor, please review the enclosed instructions. Your monitor  has already been registered assigning a specific monitor serial # to you.  Billing and Patient Assistance Program Information  We have supplied [employer]  with any of your insurance information on file for billing purposes. [employer] offers a sliding scale Patient Assistance Program for patients that do not have  insurance, or whose insurance does not completely cover the cost of the ZIO monitor.  You must apply for the Patient Assistance Program to qualify for this discounted rate.  To apply, please call [employer] at [phone], select option 4, select option 2, ask to apply for  Patient Assistance Program. Sanna Crystal will ask your household income, and how many people  are in your household. They will quote your out-of-pocket cost based on that information.  [employer] will also be able to set up a 10-month, interest-free payment plan if needed.  Applying the monitor   Shave hair from upper left chest.  Hold abrader disc by orange tab. Rub abrader in 40 strokes over the upper left chest as  indicated in your monitor instructions.  Clean area with 4 enclosed alcohol pads. Let dry.  Apply patch as indicated in monitor instructions. Patch will be placed under collarbone on left  side of chest with arrow pointing upward.  Rub patch adhesive wings  for 2 minutes. Remove white label marked "1". Remove the white  label marked "2". Rub patch adhesive wings for 2 additional minutes.  While looking in a mirror, press and release button in center of patch. A small green light will  flash 3-4 times. This will be your only indicator that the monitor has been turned on.  Do not shower for the first 24 hours. You may shower after the first 24 hours.  Press the button if you feel a symptom. You will hear a small click. Record Date, Time and  Symptom in the Patient Logbook.  When you are ready to remove the patch, follow instructions on the last 2 pages of Patient  Logbook. Stick patch monitor onto the last page of Patient Logbook.  Place Patient Logbook in the blue and white box. Use locking tab on box and tape box closed  securely. The blue and white box has prepaid postage on it. Please place it in the mailbox as  soon as possible. Your physician should have your test results approximately 7 days after the  monitor has been mailed back to [facility]  Call [facility] Customer Care at [phone] if you have questions regarding  your ZIO XT patch monitor. Call them immediately if you see an orange light blinking on your  monitor.  If your monitor falls off in less than 4 days, contact our Monitor department at [phone].  If your monitor becomes loose or falls off after 4 days call [employer] at [phone] for  suggestions on securing your monitor   Follow-Up: At [facility], you and your health needs are our priority.  As part of our continuing mission to provide you with exceptional heart care, our providers are all part of one team.  This team includes your primary Cardiologist (physician) and Advanced Practice Providers or APPs (Physician Assistants and Nurse Practitioners) who all work together to provide you with the care you need, when you need it.  Your next appointment:   2 month(s)  Provider:   Slater Duncan, NP    We recommend signing up for the patient portal called "MyChart".  Sign up information is provided on this After Visit Summary.  MyChart is used to connect with patients for Virtual Visits (Telemedicine).  Patients are able to view lab/test results, encounter notes, upcoming appointments, etc.  Non-urgent messages  can be sent to your provider as well.   To learn more about what you can do with MyChart, go to [email or web address].   Other Instructions  Patient given a handicap placard today.  Patient given script for BP monitor.       Adopting a Healthy Lifestyle.   Weight: Know what a healthy weight is for you (roughly BMI <25) and aim to maintain this. You can calculate your body mass index on your smart phone. Unfortunately, this is not the most accurate measure of healthy weight, but it is the simplest measurement to use. A more accurate measurement involves body scanning which measures lean muscle, fat tissue and bony density. We do not have this equipment at [facility].    Diet: Aim for 7+ servings of fruits and vegetables daily Limit animal fats in diet for cholesterol and heart health - choose grass fed whenever available Avoid highly processed foods (fast food burgers, tacos, fried chicken, pizza, hot dogs, french fries)  Saturated fat comes in the form of butter, lard, coconut oil, margarine, partially hydrogenated oils, and fat in meat. These increase your risk of cardiovascular disease.  Use healthy plant oils, such as olive, canola, soy, corn, sunflower and peanut.  Whole foods such as fruits, vegetables and whole grains have fiber  Men need > 38 grams of fiber per day Women need > 25 grams of fiber per day  Load up on vegetables and fruits - one-half of your plate: Aim for color and variety, and remember that potatoes dont count. Go for whole grains - one-quarter of your plate: Whole wheat, barley, wheat berries, quinoa, oats, brown rice, and foods made with  them. If you want pasta, go with whole wheat pasta. Protein power - one-quarter of your plate: Fish, chicken, beans, and nuts are all healthy, versatile protein sources. Limit red meat. You need carbohydrates for energy! The type of carbohydrate is more important than the amount. Choose carbohydrates such as vegetables, fruits, whole grains, beans, and nuts in the place of white rice, white pasta, potatoes (baked or fried), macaroni and cheese, cakes, cookies, and donuts.  If youre thirsty, drink water. Coffee and tea are good in moderation, but skip sugary drinks and limit milk and dairy products to one or two daily servings. Keep sugar intake at 6 teaspoons or 24 grams or LESS       Exercise: Aim for 150 min of moderate intensity exercise weekly for heart health, and weights twice weekly for bone health Stay active - any steps are better than no steps! Aim for 7-9 hours of sleep daily      Tackling Obesity with Lifestyle Changes  Obesity- What is it? And What can we do about it?  Obesity is a chronic complex disease defined as excessive fat deposits that can have a negative effect on our health. It can lead to many other diseases including type 2 diabetes.  Weight gain occurs when the amount of energy (calories) we consume is greater than the amount we use.  When our energy output is greater than our energy input we lose weight. The basic concept is simple, but in reality, it's much more complicated.  Unfortunately, in some people, our bodies have many ways it can compensate when we try to eat less and move more which can prevent us  from changing our weight. This can lead to some people having a much more difficult time losing weight even when they put healthy habits into practice. This can be frustrating.  We want to focus on healthy habits, physical activity and how we feel, and less the number on the scale.  Food As Energy  Calories  Calories is just a unit of measurement for  energy.  Counting calories is not required to lose weight but counting for a short period of time can:   help you learn good portion sizes   Learn what your true energy needs are.   Help you be more aware of your snacking or grazing habits  To help calculate how many calories you should be eating, the NIH has a great body weight planner calculator at BeverageBuggy.si  Types of Energy Expenditure  Basal Metabolic Rate (BMR) Energy that our bodies use to preform everyday tasks. More muscle mass through resistance training can increase this a small amount  Thermic Effect of Food The amount of energy that it takes to breakdown the food we eat. This will be highest when we eat protein and fiber rich foods  Exercise Energy Expenditure The amount of energy used during formal exercise (walking, biking, weightlifting)  Non-exercise activity thermogenesis (NEAT) The amount of energy spent on activities that are not formal exercise (standing, fidgeting). Therefore, it is not only important to do formal exercise but also move around throughout the day.  Managing The Meal  Macro nutrients (carbohydrates, fats and protein, fiber, water)  Micronutrients (vitamins, minerals)  Dietary Fiber  Benefits Examples Cautions  Soluble fiber  Decreases cholesterol  improve blood sugar control,  Feeds our gut bacteria  Allows us  to feel fuller for longer so we eat less  fruits  oats  barley  legumes  peas  Beans  vegetables (broccoli) and root vegetables (carrots) Add fiber into your diet slowly and be sure to drink at least 8 cups of water a day. This will help limit gas, bloating, diarrhea, or constipation.  Insoluble Fiber  Improves digestive health by making stool easier to pass  Allows us  to feel fuller for longer so we eat less  whole grains  nuts  seeds  skin of fruit  vegetables (green beans, zucchini, cauliflower)  Tricks to add more fiber to your diet   Add beans  (pinto, kidney, lima, navy and garbanzo) to salads, ground meat or brown rice   Add nuts or seeds and or fresh/frozen fruit to yogurt, cottage cheese, salads or steel cut oats   Cut up vegetables and eat with hummus   Look for unsweetened whole grain cereals with at least 5g of fiber per serving   Switch to whole grain bread. Look for bread that has whole grain flour as the first ingredient and has more fiber than carbs if you were to multiple the fiber x 10.   Try bulgar, barely, quinoa, buckwheat, brown rice wild rice instead of white rice   Keep frozen vegetables on hand to add to dishes or soups  Meal Planning:  Meal planning is the key to setting you up for success. Here are some examples of healthy meal options.  Breakfast  Option 1: Omelette with vegetables (1 egg, spinach, mushrooms, or other vegetable of your choice), 2 slices whole-grain toast, tip of thumb size butter or soft margarine,  cup low-fat milk or yogurt  Option 2: steel-cut rolled oats (? cup dry), 1 tbsp peanut butter added to cooked oats,  cup low-fat milk.  Option 3: 2 slices whole-grain or rye toast with avocado spread ( small avocado mased with herbs and pepper to taste), 1 poached egg or [place]  up (cooked to your liking)  Option 4:  cup plain 0% [place] yogurt topped with  cup berries and  cup walnuts or almonds, 2 slices whole-grain or rye toast, tip of thumb size soft margarine/butter  Lunch:  Option 1: 2 cups red lentil soup, green salad with 1 tbsp homemade vinaigrette (extra virgin olive oil and vinegar of choice plus spices)  Option 2: 3 oz. roasted chicken, 2 slices whole-grain bread, 2 tsp mayonnaise, mustard, lettuce, tomato if desired, 1 fruit (example: medium-sized apple or small pear)  Option 3: 3 oz. tuna packed in water, 1 whole-wheat pita (6 inch), 2 tsp mayonnaise, lettuce, tomato, or other non-starchy vegetable of your choice, 1 fruit (example: medium-sized apple or small  pear)  Option 4: 1 serving of garden veggie buddha bowl with lentils and tahini sauce and 1 cup berries topped with  cup plain 0% Greek yogurt  Dinner:  Option 1: 1 serving roasted cauliflower salad, 3-4 oz. grilled or baked pork loin chop, 1/2 cup mashed potato, or brown rice or quinoa  Option 2: 1 serving fish (baked, grilled or air fried), green salad, 1 tbsp homemade vinaigrette,  cup cooked couscous  Option 3: 1 cup cooked whole grained pasta (example: spaghetti, spirals, macaroni),  cup favorite pasta sauce (preferably homemade), 3-4 oz. grilled or baked chicken, green salad, 1 tbsp homemade vinaigrette  Option 4: 1 serving oven roasted salmon,  cup mashed sweet potato or couscous or brown rice or quinoa, broccoli (steamed or roasted)  Healthy snacks:   Carrots or celery with 1 tbsp of hummus   1 medium-sized fruit (apple or orange)   1 cup plain 0% [place] yogurt with  cup berries   Half apple, sliced, with 1 tbsp (15 mL) peanut or almond butter  Dining out:  Eating away from home has become a part of many people's lifestyle. Making healthy choices when you are eating out is important too. Portion size is an important part of healthy choices. Most branded fast-food places provide calories, sodium, and fat content for their menu items. www.calorieking.com would be great resource to find nutrition facts for your favorite brands and fast-food restaurants. Company specific website can be Chief Technology Officer for nutrition information for their items. (e.g. www.mcdonalds.com or www.nutritionix.com/[place]/menu/premium)  Here are some tips to help you make wise food choices when you are dining out.  Chose more often Avoid  Beverages   Choose more often: Water, low fat milk  Sugar-free/diet drinks  Unsweet tea or coffee    Avoid: Milkshakes, fruit drinks, regular pop  Alcohol, specialty drinks (e.g. iced cappuccino)  Fast food  Choose more often:  Garden salad   Mini subs, pita sandwiches ect with extra vegetables  plain burgers, grilled chicken  Vegetarian or cheese pizza with whole-grain crust    Avoid: Burgers/sandwiches with bacon, cheese, and high-fat sauces  [place] fries, fried chicken, fried fish, poutine, hash browns  Pizza with processed meats  Starters   Choose more often: Raw vegetables, salads (garden, spinach, fruit)  clear or vegetable soups  Seafood cocktail  Whole-grain breads and rolls    Avoid: Salads with high-fat dressings or toppings  Creamy soups  Wings, egg rolls  onion rings, nachos  White or garlic bread  Main courses Grains & Starches (amount equal to  of your plate)  Choose more often:  Oatmeal, high-fiber/lower-sugar cereals  Whole-grain breads, rice, pasta, barley, couscous  Sweet potatoes    Avoid: Sugary, low-fiber cereals  Large bagels, muffins, croissants, white bread  [place] fries, hash browns, fried rice   Meat and alternative (amount equal to  of your plate)  Choose more often:  Lean meats, poultry, fish, eggs, low-fat cheese  Tofu, vegetable protein Legumes (e.g. lentils, chickpeas, beans)    Avoid: High-salt and/or high-fat meats (e.g. ribs, wings, sausages, wieners, processed lunch meats, imposter meats)   Vegetables (amount equal to  of your plate)  Choose more often:  Salads ([place], garden, spinach), plain vegetables   Avoid:  Salads with creamy, high-fat dressings and   Vegetables on sandwiches ect toppings like bacon bits, croutons, cheese  Desserts  Choose more often:  Fresh fruit, frozen yogourt, skim milk latte      Avoid: Cakes, pies, pastries, ice cream, cheesecake

## 2023-09-09 NOTE — Progress Notes (Signed)
 Cardiology Office Note   Date:  09/09/2023  ID:  Bailey Hooper, DOB Oct 27, 1965, MRN [id number] PCP: Azell Boll, MD  [facility] Providers Cardiologist:  None     History of Present Illness Bailey Hooper is a 58 y.o. female with history of multivessel CAD with positive FFR on coronary CTA 07/26/23, resistant hypertension, hyperlipidemia, OSA, GERD, non-alcoholic fatty liver disease, bipolar disorder, depression/anxiety, and tobacco abuse.   She established care with Dr. Rolm Clos 11/2018 for murmur and DOE. Echo revealed normal LVEF, G2DD, normal RV, no significant valve dz. Seen again by Dr. Alois Arnt 06/2023 for resistant hypertension and coronary artery calcification on CT. Was non-compliant with CPAP machine. Was non-compliant with anti-hypertensive therapy due to dizziness. Aldosterone:Renin ration 05/2023 was elevated at 32.4 but aldosterone was < 10 so not conclusive. She was started on eplerenone . Abdominal/pelvic CT ordered to assess for adrenal adenoma but not completed.   Coronary CTA completed 07/26/2023 revealed coronary calcium  score 331 with at least moderate multivessel CAD with abnormal FFR in RCA and LCx and borderline positive FFR in LAD.  Seen by Sharren Decree, PA, on 08/06/2023 at which time she reported remittent chest heaviness which had been occurring for years, states chronic DOE and sometimes has to stop when walking.  No orthopnea.  Brief intermittent palpitations.  She also reported lightheadedness/dizziness that mostly occurs when she is driving which is followed by n/v and urgent need to have a bowel movement. One syncopal episode approximately 2 years prior after taking cold medicine, and feeling lightheaded, she woke up with "dried vomit" and went to UC.  She reported feeling tired all of the time.  History of sleep apnea but has not used CPAP in years.  STOP-BANG score is 5.  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 Echo 09/06/23 revealed normal LVEF, no rwma, mild LVH, G1DD, no significant valve disease.   Today, she is here for follow-up of CAD. 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 Since cardiac cath, she has had a few episodes of chest pain and dizziness, with a heavy and tight sensation in her chest and a sensation of her heart racing. These symptoms occur even at rest and are accompanied by a 'funny' feeling in her head. She has no current symptoms at the time of the visit. Symptoms do not worsen with exertion. No orthopnea, PND, edema, presyncope or syncope. She is tolerating Imdur  without any concerning side effects. She uses nitroglycerin  sublingually as needed. She is concerned about her blood pressure control and does not have a home cuff. She is prediabetic and actively trying to quit smoking with the aid of Chantix . She has decreased her smoking but finds it challenging to quit completely. She maintains an active lifestyle by frequently using stairs and assisting her mother with household tasks.  She is seeking disability due to chronic anxiety and depression and financial constraints.   ROS: See HPI  Studies Reviewed      Lipoprotein (a)  Date/Time Value Ref Range Status  03/05/2023 03:16 PM 57.3 <75.0  nmol/L Final    Comment:    Note:  Values greater than or equal to 75.0 nmol/L may        indicate an independent risk factor for CHD,        but must be evaluated with caution when applied        to non-Caucasian populations due to the        influence of genetic factors on Lp(a) across         ethnicities.     Risk Assessment/Calculations           Physical Exam VS:  BP 126/68   Pulse 70   Ht 5\' 6"  (1.676 m)   Wt 183 lb 11.2 oz (83.3 kg)   LMP 02/12/2017 Comment: not sexually active  SpO2 96%   BMI 29.65 kg/m    Wt Readings from Last 3 Encounters:  09/09/23 183 lb 11.2 oz (83.3 kg)  08/22/23 183 lb 4 oz (83.1 kg)  08/15/23 184 lb (83.5 kg)    GEN: Well nourished, well developed in no acute distress NECK: No JVD; No carotid bruits CARDIAC: RRR, no murmurs, rubs, gallops RESPIRATORY:  Clear to auscultation without rales, wheezing or rhonchi  ABDOMEN: Soft, non-tender, non-distended EXTREMITIES:  No edema; No deformity   ASSESSMENT AND PLAN  Assessment & Plan Coronary artery disease  Moderate to severe multivessel CAD including 70% proximal LAD, 80% D1, multifocal LCx disease up to 70 to 80%, and 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  Advised that case was reviewed at the heart team and given no favorable PCI targets, severe tortuosity and anomalous RCA, recommendation to treat medically. Results reviewed in detail and questions answered to her satisfaction. She reports rare episodes of chest pain which have been relieved with SL NTG. We discussed the importance of anti-anginal therapy. She is tolerating medications without adverse side effects including aspirin , olmesartan , amlodipine , Imdur , atorvastatin , metoprolol . No bleeding concerns. Continue to use sublingual nitroglycerin  as needed for angina. Lengthy discussion regarding focus on secondary prevention including heart healthy mostly plant based diet avoiding saturated fat, processed foods, simple carbohydrates, and sugar along with aiming for at least 150 minutes of moderate intensity exercise each week, increasing intensity gradually. Dietary recommendations provided.   Hypertension with left ventricular hypertrophy   BP initially elevated but  improved on recheck. Echo 09/06/2023 revealed mild LVH, normal LVEF, G1 DD. Advised the importance of good BP control in the setting of left ventricular hypertrophy. Rx given for home blood pressure cuff. Long history of resistant hypertension. We will get plasma catecholamines and metanephrines. Advised daily monitoring of BP for goal < 130/80.   Palpitations She reports episodes of palpitations associated with dizziness.  We will get 7-day ZIO monitor for evaluation of arrhythmia.  Prediabetes   Prediabetes with most recent A1c of 6.0 on 02/27/2023. Monitoring and lifestyle modifications are important. Check A1c in one month along with cholesterol levels. Provided dietary recommendations.  OSA History of sleep apnea reported with previous CPAP, which she has not worn in several years.  STOP-BANG score is 5.  We will get Itamar for evaluation of sleep apnea.   Hyperlipidemia LDL goal < 70 Lipid panel completed 08/08/2023 with total cholesterol 161, triglycerides 164, HDL 38, and LDL-C 94.  Atorvastatin  was increased to 80 mg daily.  We will repeat lipid panel in 1 month.  Nicotine  dependence   History of nicotine  dependence with ongoing efforts to  quit smoking, currently using varenicline  (Chantix ) with some reduction in smoking. Continue varenicline  for smoking cessation. Complete cessation advised.          Dispo: 2 months with me  Signed, Slater Duncan, NP-C

## 2023-09-12 ENCOUNTER — Other Ambulatory Visit: Payer: Self-pay | Admitting: Family Medicine

## 2023-09-12 DIAGNOSIS — J45909 Unspecified asthma, uncomplicated: Secondary | ICD-10-CM

## 2023-09-16 ENCOUNTER — Telehealth: Admitting: Physician Assistant

## 2023-09-16 DIAGNOSIS — S0086XA Insect bite (nonvenomous) of other part of head, initial encounter: Secondary | ICD-10-CM | POA: Diagnosis not present

## 2023-09-16 DIAGNOSIS — F410 Panic disorder [episodic paroxysmal anxiety] without agoraphobia: Secondary | ICD-10-CM | POA: Diagnosis not present

## 2023-09-16 DIAGNOSIS — L299 Pruritus, unspecified: Secondary | ICD-10-CM

## 2023-09-16 DIAGNOSIS — F331 Major depressive disorder, recurrent, moderate: Secondary | ICD-10-CM | POA: Diagnosis not present

## 2023-09-16 DIAGNOSIS — W57XXXA Bitten or stung by nonvenomous insect and other nonvenomous arthropods, initial encounter: Secondary | ICD-10-CM | POA: Diagnosis not present

## 2023-09-16 DIAGNOSIS — F4320 Adjustment disorder, unspecified: Secondary | ICD-10-CM | POA: Diagnosis not present

## 2023-09-16 MED ORDER — TRIAMCINOLONE ACETONIDE 0.025 % EX OINT: 1.0000 | TOPICAL_OINTMENT | Freq: Two times a day (BID) | CUTANEOUS | 0 refills | Status: AC

## 2023-09-16 NOTE — Progress Notes (Signed)
 E-Visit for Insect Sting/Bug Bite  Thank you for describing the insect sting for us .  Here is how we plan to help!  Based on the information you have shared with me it looks like you have: Possible bug bites, potentially mite bites. There are no specific treatments to prevent mite bites except avoidance to the best of our abilities. We do treat the symptoms as these can be extremely itchy.   I have prescribed: Triamcinolone  0.025% to apply topically once or twice daily to affected areas. Do not put in or in close proximity to the eye.   Based on your information I have: and Provided a home care guide for insect stings and instructions on when to call for help.  What can be used to prevent Insect Stings?  Insect repellant with at least 20% DEET.  Wearing long pants and shirts with socks and shoes.  Wear dark or drab-colored clothes rather than bright colors.  Avoid using perfumes and hair sprays; these attract insects.  HOME CARE ADVICE:  1. Stinger removal: The stinger looks like a tiny black dot in the sting. Use a fingernail, credit card edge, or knife-edge to scrape it off.  Don't pull it out because it squeezes out more venom. If the stinger is below the skin surface, leave it alone.  It will be shed with normal skin healing. 2. Use cold compresses to the area of the sting for 10-20 minutes.  You may repeat this as needed to relieve symptoms of pain and swelling. 3.  For pain relief, take acetominophen 650 mg 4-6 hours as needed or ibuprofen  400 mg every 6-8 hours as needed or naproxen  250-500 mg every 12 hours as needed. 4.  You can also use hydrocortisone  cream 0.5% or 1% up to 4 times daily as needed for itching. 5.  If the sting becomes very itchy, take Benadryl  25-50 mg, follow directions on box. 6.  Wash the area 2-3 times daily with antibacterial soap and warm water. 7. Call your Doctor if: Fever, a severe headache, or rash occur in the next 2 weeks. Sting area begins to  look infected. Redness and swelling worsens after home treatment. Your current symptoms become worse.    MAKE SURE YOU:  Understand these instructions. Will watch your condition. Will get help right away if you are not doing well or get worse.  Thank you for choosing an e-visit.  Your e-visit answers were reviewed by a board certified advanced clinical practitioner to complete your personal care plan. Depending upon the condition, your plan could have included both over the counter or prescription medications.  Please review your pharmacy choice. Make sure the pharmacy is open so you can pick up prescription now. If there is a problem, you may contact your provider through [employer] and have the prescription routed to another pharmacy.  Your safety is important to us . If you have drug allergies check your prescription carefully.   For the next 24 hours you can use MyChart to ask questions about today's visit, request a non-urgent call back, or ask for a work or school excuse. You will get an email in the next two days asking about your experience. I hope that your e-visit has been valuable and will speed your recovery.    I have spent 5 minutes in review of e-visit questionnaire, review and updating patient chart, medical decision making and response to patient.   Angelia Kelp, PA-C

## 2023-09-19 ENCOUNTER — Encounter (HOSPITAL_BASED_OUTPATIENT_CLINIC_OR_DEPARTMENT_OTHER): Payer: Self-pay

## 2023-09-19 NOTE — Telephone Encounter (Signed)
 Please review and advise.

## 2023-09-20 ENCOUNTER — Ambulatory Visit (INDEPENDENT_AMBULATORY_CARE_PROVIDER_SITE_OTHER): Admitting: Podiatry

## 2023-09-20 DIAGNOSIS — M2141 Flat foot [pes planus] (acquired), right foot: Secondary | ICD-10-CM | POA: Diagnosis not present

## 2023-09-20 DIAGNOSIS — B351 Tinea unguium: Secondary | ICD-10-CM

## 2023-09-20 DIAGNOSIS — M2142 Flat foot [pes planus] (acquired), left foot: Secondary | ICD-10-CM | POA: Diagnosis not present

## 2023-09-20 DIAGNOSIS — M216X2 Other acquired deformities of left foot: Secondary | ICD-10-CM

## 2023-09-20 DIAGNOSIS — M79675 Pain in left toe(s): Secondary | ICD-10-CM

## 2023-09-20 DIAGNOSIS — M79674 Pain in right toe(s): Secondary | ICD-10-CM | POA: Diagnosis not present

## 2023-09-20 NOTE — Telephone Encounter (Signed)
Patient following up.

## 2023-09-23 NOTE — Progress Notes (Signed)
  Subjective:  Patient ID: Bailey Hooper, female    DOB: Jul 10, 1965,  MRN: [id number]  Chief Complaint  Patient presents with   Nail Problem    Rm Patient is here for routine foot care and nail trimming.    59 year old female, with a history of foot surgery, presents with a painful callus on the foot that has been causing discomfort as well as for thick, elongated nails that she is not able to trim herself.  No open lesions noted.  No injuries or other concerns.   Objective:    Physical Exam         General: AAO x3, NAD  Dermatological: Hyperkeratotic lesion noted along the navicular tuberosity of the left foot, submetatarsal 2 left right hallux.  There is no underlying ulceration, drainage or any signs of infection.  There is no evidence of foreign body.  Nails are hypertrophic, dystrophic, brittle, discolored, elongated 10. No surrounding redness or drainage. Tenderness nails 1-5 bilaterally.  Nails become ingrown without any signs of infection today.  Vascular: Dorsalis Pedis artery and Posterior Tibial artery pedal pulses are 2/4 bilateral with immedate capillary fill time.  There is no pain with calf compression, swelling, warmth, erythema.   Neruologic: Grossly intact via light touch bilateral.   Musculoskeletal: Flatfoot is present.  Prominent metatarsal head plantarly.  Nonsignificant tuberosity bilaterally but slightly resulting in the skin lesion, rubbing.  No area pinpoint tenderness.  Gait: Unassisted, Nonantalgic.         Assessment:   Pes planovalgus; symptomatic onychomycosis  Plan:  Pes planovalgus -Continue shoes, good arch support.  As a courtesy I debrided the callus without any complications or bleeding.  Discussed moisturizer, offloading.  I do think she will benefit from good arch supports.  Symptomatic onychomycosis -Sharply debrided nails x 10 and complications of bleeding.  She has previously tried topical antifungal medicine.  Urea nail gel  Return  in about 3 months (around 12/21/2023).  Charity Conch DPM

## 2023-10-01 ENCOUNTER — Ambulatory Visit (INDEPENDENT_AMBULATORY_CARE_PROVIDER_SITE_OTHER): Admitting: Clinical

## 2023-10-01 DIAGNOSIS — F3132 Bipolar disorder, current episode depressed, moderate: Secondary | ICD-10-CM | POA: Diagnosis not present

## 2023-10-01 DIAGNOSIS — F411 Generalized anxiety disorder: Secondary | ICD-10-CM

## 2023-10-01 DIAGNOSIS — H52203 Unspecified astigmatism, bilateral: Secondary | ICD-10-CM | POA: Diagnosis not present

## 2023-10-01 LAB — HM DIABETES EYE EXAM

## 2023-10-01 NOTE — Progress Notes (Signed)
 Virtual Visit via Video Note   I connected with Bailey Hooper on 10/01/23 at  2:00 PM EDT by a video enabled telemedicine application and verified that I am speaking with the correct person using two identifiers.   Location: Patient: home Provider: office   I discussed the limitations of evaluation and management by telemedicine and the availability of in person appointments. The patient expressed understanding and agreed to proceed.   THERAPIST PROGRESS NOTE   Session Time: 2:00 PM-2:40 PM   Participation Level: Active   Behavioral Response: CasualAlertAnxious   Type of Therapy: Individual Therapy   Treatment Goals addressed: Coping   Interventions: CBT   Summary: Bailey Hooper is a 58 y.o. female who presents with Bipolar Disorder./ GAD. The OPT therapist worked with the patient for her scheduled OPT session. The OPT therapist utilized Motivational Interviewing to assist in creating therapeutic repore. The patient in the session was engaged and work in collaboration giving feedback about her triggers and symptoms over the past few weeks. The patient spoke about struggle with her Cardiac problem effecting her energy levels. The patient spoke about working to be mindful of putting herself first and working on making decisions that promote good health. The patient spoke about changing her diet and being mindful of the heat during the Summer, as well as limiting her interaction/involvement with family related stressors. The OPT therapist utilized Cognitive Behavioral Therapy through cognitive restructuring as well as worked with the patient on coping strategies to assist in management of mood and as she continues to work on family interactions,  finances, physical and mental health. The patient spoke about her improved awareness of where her stress boundaries are and being aware of when she needs to implement coping to create balance in her life. The patient spoke about her ongoing disability case.  The patient spoke about trying to find a balance between her health needs and her work hours to manage rising costs.   Suicidal/Homicidal: Nowithout intent/plan   Therapist Response: The OPT therapist worked with the patient for the patients scheduled session. The patient was engaged in her session and gave feedback in relation to triggers, symptoms, and behavior responses over the past few weeks. The OPT therapist worked with the patient utilizing an in session Cognitive Behavioral Therapy exercise. The patient was responsive in the session and verbalized,  I have been working to change my diet and manage my energy with my heart condition and just limit my stress. The OPT therapist worked with the patient overviewing basic care needs including eating, sleeping, exercise, and hygenie. The patient identified her need to focus on herself and realization she cannot work outside of what is in her control. The patient spoke about working to not overwhelm herself and or by being overwhelmed by committing to family request. The patient spoke about her physical health concerns amplifying her  stress, but will  be looking to get ongoing treatment advice from her specialist noting about 30% of her heart capacity functioning. The patient spoke about her ongoing involvement with her Cardiologist completing recently a monitoring system that she will be turing back in to her Cardiologist for ongoing treatment recommendations.The OPT therapist continued to work with the patient on mindfulness DBT and her work/life balance while promoting the patient continuing to follow the directives of her health professionals.The OPT therapist will continue treatment work with the patient in her next scheduled session.   Plan: Return again in 3 weeks.   Diagnosis:  Axis I: Bipolar Disorder/ GAD                             Axis II: No diagnosis   Collaboration of Care: No additional collaboration of care for this session.     Patient/Guardian was advised Release of Information must be obtained prior to any record release in order to collaborate their care with an outside provider. Patient/Guardian was advised if they have not already done so to contact the registration department to sign all necessary forms in order for us  to release information regarding their care.    Consent: Patient/Guardian gives verbal consent for treatment and assignment of benefits for services provided during this visit. Patient/Guardian expressed understanding and agreed to proceed      I discussed the assessment and treatment plan with the patient. The patient was provided an opportunity to ask questions and all were answered. The patient agreed with the plan and demonstrated an understanding of the instructions.   The patient was advised to call back or seek an in-person evaluation if the symptoms worsen or if the condition fails to improve as anticipated.   I provided 40 minutes of non-face-to-face time during this encounter.   Jerel ONEIDA Pepper, LCSW   10/01/2023

## 2023-10-03 DIAGNOSIS — B850 Pediculosis due to Pediculus humanus capitis: Secondary | ICD-10-CM | POA: Diagnosis not present

## 2023-10-07 ENCOUNTER — Telehealth: Payer: Self-pay

## 2023-10-07 NOTE — Telephone Encounter (Signed)
**Note De-Identified Bailey Hooper Obfuscation** Ordering provider: Rosaline Bane, NP Associated diagnoses: Palpitations-R00.2  WatchPAT PA obtained on 10/07/2023 by Merary Garguilo, Avelina HERO, LPN. Authorization: Per the MEDICAID [employer] [place] website: [place]  Medicaid ([place] Medicaid) through [employer] of [place] does not require a prior authorization for CPT Code 04199 ([facility]amar-HST).  Patient notified of PIN ([id number]) on 10/07/2023 Taksh Hjort Notification Method: phone.  Phone note routed to covering staff for follow-up.

## 2023-10-23 ENCOUNTER — Encounter: Payer: Self-pay | Admitting: Physical Therapy

## 2023-10-23 ENCOUNTER — Ambulatory Visit: Attending: Neurosurgery | Admitting: Physical Therapy

## 2023-10-23 DIAGNOSIS — M6281 Muscle weakness (generalized): Secondary | ICD-10-CM | POA: Diagnosis not present

## 2023-10-23 DIAGNOSIS — M542 Cervicalgia: Secondary | ICD-10-CM | POA: Insufficient documentation

## 2023-10-23 NOTE — Therapy (Signed)
 OUTPATIENT PHYSICAL THERAPY CERVICAL RE- ASSESSMENT + RECERTIFICATION   Patient Name: Bailey Hooper MRN: [id number] DOB:1965/11/28, 58 y.o., female Today's Date: 10/23/2023  END OF SESSION:  PT End of Session - 10/23/23 1402     Visit Number 2    Number of Visits 17    Date for PT Re-Evaluation 12/18/23    Authorization Type [employer]    Authorization Time Period auth after 27VL    PT Start Time 1406   late check in + restroom   PT Stop Time 1440    PT Time Calculation (min) 34 min    Activity Tolerance Patient tolerated treatment well           Past Medical History:  Diagnosis Date   Allergy    Anxiety    Arthritis    Asthma    Depression    Fibroids    GERD (gastroesophageal reflux disease)    Hiatal hernia    High cholesterol    Hypertension    IBS (irritable bowel syndrome)    Migraines    Non-alcoholic fatty liver disease    Prediabetes    Sleep apnea    cpap   Tobacco use    Vertigo    Past Surgical History:  Procedure Laterality Date   CESAREAN SECTION     COLONOSCOPY     FOOT SURGERY     Left foot   LEFT HEART CATH AND CORONARY ANGIOGRAPHY N/A 08/15/2023   Procedure: LEFT HEART CATH AND CORONARY ANGIOGRAPHY;  Surgeon: Mady Bruckner, MD;  Location: [facility];  Service: Cardiovascular;  Laterality: N/A;   Patient Active Problem List   Diagnosis Date Noted   Family history of dementia 08/16/2021   NAFLD (nonalcoholic fatty liver disease) 95/70/7977   H/O colonoscopy 06/16/2020   Hot flashes 04/27/2019   Bipolar disorder (HCC) 01/18/2019   Asthma 01/18/2019   Hyperlipidemia 07/14/2018   Tobacco abuse 11/29/2017   Benign paroxysmal positional vertigo 11/29/2017   Major depressive disorder, recurrent episode, moderate (HCC) 06/28/2009   GENITAL HERPES 01/17/2009   Migraine headache 01/17/2009   Essential hypertension, benign 01/17/2009   GERD (gastroesophageal reflux disease) 01/17/2009   Irritable bowel syndrome 01/17/2009    Heart murmur 01/17/2009    PCP: Delores Suzann HERO, MD  REFERRING PROVIDER: Louis Shove, MD  REFERRING DIAG: [phone] (ICD-10-CM) - Radiculopathy, cervical region  THERAPY DIAG:  Cervicalgia  Muscle weakness (generalized)  Rationale for Evaluation and Treatment: Rehabilitation  ONSET DATE: ~2021 initially, this episode February/March  SUBJECTIVE:  Per eval: Pt states she has had excruciating pain/numbness in UE, shooting. Pt states first episode occurred in 2021, received work up and resolved after a couple weeks. Occurred again in 2023, self resolved after a couple weeks.  Pt states this most recent episode began in February/march and has essentially resolved as far as UE symptoms go. States she does still have some neck/shoulder pain. Thinks it may be provoked by lifting. States sometimes she will get headaches with neck pain, has history of migraines (since early adulthood).  She notes migraines actually seem to be improving over past couple of years. States pain will sometimes affect sleeping, difficulty positioning for sleep, side sleeping. States she was getting OT for her hand, states this is the same issue and she was told it's coming from her neck. Hand dominance: Right  SUBJECTIVE STATEMENT: 10/23/2023: still getting some catches, RUE still getting some pain but will feel better when stretching. States she followed w/ cardiology and they are managing medically at this time, chest discomfort and dizziness remain about the same. States she has done HEP, sometimes feels good sometimes is painful.    PERTINENT HISTORY:  chest pain w/ heart cath 08/15/23; asthma, anxiety/depression, HTN, IBS, migraines, GERD Pt states chest pain relieved by sublingual medication  PAIN:  Are you having pain:  7/10 at present  Per eval:  Location/description: neck, more R sided; sometimes has UE symptoms into hand Best-worst over past week: 2-6/10  - aggravating factors: lifting - otherwise difficult to describe - Easing factors:  heating pad, tylenol   PRECAUTIONS: recent cardiac hx  RED FLAGS: Reports dizziness and chest pain at times, both of which are reportedly resolved by sublingual medication - currently undergoing cardiology work up No difficulty swallowing, no double vision, no bowel/bladder changes. N/T as described above which have occurred on both extremities but typically episodic with one at a time. Chronic headaches that are occasionally coincident with neck pain and numbness, but reportedly improving over last few years. Reports history of vertigo with head movement and sup<>sit    WEIGHT BEARING RESTRICTIONS: No  FALLS:  Has patient fallen in last 6 months? 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  DIAGNOSTIC FINDINGS:  07/19/23 cervical MRI: IMPRESSION: 1. 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 2. Moderate left foraminal stenosis at C5-C6 secondary to uncovertebral joint disease. 3. No high-grade canal stenosis.  PATIENT SURVEYS:  NDI: 29/50 ; 58%  10/23/23: 32/50   COGNITION: Overall cognitive status: Within functional limits for tasks assessed  SENSATION/NEURO: Light touch intact BIL UE, mildly reduced L C4-5 and R C7 Finger<>nose testing unremarkable Negative hoffmann and tromner sign BIL No ataxia with gait  POSTURE: rounded shoulders, mild lean to L   CERVICAL ROM:   ROM A/PROM (deg) eval  Flexion 65  deg   Extension 25 deg (transiently dizzy, she attributes to chronic vertigo)   Right lateral flexion 30 deg discomfort  Left lateral flexion 30 deg discomfort  Right rotation   Left rotation    (Blank rows = not tested) (Key: WFL = within functional limits not formally assessed, * = concordant pain, s = stiffness/stretching sensation, NT = not tested) Comment:   UPPER EXTREMITY  ROM:  A/PROM Right eval Left eval  Shoulder flexion    Shoulder abduction    Shoulder internal rotation    Shoulder external rotation    Elbow flexion    Elbow extension    Wrist flexion    Wrist extension     (Blank rows = not tested) (Key: WFL = within functional limits not formally assessed, * = concordant pain, s = stiffness/stretching sensation, NT = not tested)  Comments:    UPPER EXTREMITY MMT:  MMT Right eval Left eval R/L 10/23/23  Shoulder flexion 4 * 5   Shoulder extension     Shoulder abduction 5 5   Shoulder extension     Shoulder internal rotation     Shoulder external rotation     Elbow flexion 4 4   Elbow extension 3+ 4+   Grip strength 30# 40# 20#/25#  (Blank rows = not tested)  (Key: WFL = within functional limits not formally assessed, * = concordant pain, s = stiffness/stretching sensation, NT = not tested)  Comments:   CERVICAL SPECIAL TESTS:  deferred  FUNCTIONAL TESTS:  See grip strength above  TREATMENT DATE:  [facility] Adult PT Treatment:                                                DATE: 10/23/23 Therapeutic Exercise: Doorway rhomboid stretch 2x30sec BIL cues for comfortable ROM and setup Pball flexion rollout x12 Scapular retraction + double ER 2x10 cues for posture and relaxing UT/grip   Therapeutic Activity: MSK assessment + education Discussion re: symptom behavior and functional tolerance since initial eval, re-assessment activities and discussion    [facility] Adult PT Treatment:                                                DATE: 09/04/23 Therapeutic  Exercise: Scapular retraction; double ER + scap retraction; practice reps, HEP handout + education on appropriate performance and modification as indicated                                                                                                                            PATIENT EDUCATION:  Education details: rationale for interventions, HEP  Person educated: Patient Education method: Explanation, Demonstration, Tactile cues, Verbal cues Education comprehension: verbalized understanding, returned demonstration, verbal cues required, tactile cues required, and needs further education     HOME EXERCISE PROGRAM: Access Code: [email or web address] URL: [email or web address] Date: 09/04/2023 Prepared by: Alm Jenny  Exercises - Seated Scapular Retraction  - 2-3 x daily - 1 sets - 8-10 reps - Shoulder External Rotation and Scapular Retraction  - 2-3 x daily - 1 sets - 8-10 reps  ASSESSMENT:  CLINICAL IMPRESSION:  10/23/2023: Pt arrives after ~ 6 week gap in care which she attributes to being busy with other appts/work. Today we update measurements/goals to extend dates in POC to match initial plan for visits. Pt reports moderate pain/headache today which is fairly consistent throughout today's session, fair tolerance overall. No adverse events. Recommend continuing along current POC in order to address relevant deficits and improve functional tolerance. Pt departs today's session in no acute distress, all voiced questions/concerns addressed appropriately from PT perspective.    Per eval: Patient is a pleasant 58 y.o. woman who was seen today for physical therapy evaluation and treatment for neck pain/UE pain. Symptoms have fluctuated since ~2021 per pt report - tend to be exacerbated by lifting and neck movement. Does not endorse any cervical red flags, ongoing cardiac work up (refer to [facility] for details). On exam she demonstrates mild limitations in cervical mobility, good GH mobility,  reduced RUE strength, and sensory impairments in UE. Tolerates exam/HEP well overall without increase in resting pain, no adverse events. Recommend trial of skilled PT to address aforementioned deficits with aim of improving functional tolerance and reducing pain with typical activities. Pt departs today's session in no acute distress, all voiced concerns/questions addressed appropriately from PT perspective.     OBJECTIVE IMPAIRMENTS: decreased activity tolerance, decreased endurance, decreased mobility, decreased ROM, decreased strength, impaired perceived functional ability, impaired UE functional use, improper body mechanics, postural dysfunction, and pain.   ACTIVITY LIMITATIONS: carrying, lifting, and sleeping  PARTICIPATION LIMITATIONS: meal prep, cleaning, laundry, community activity, and occupation  PERSONAL FACTORS: Time since onset of injury/illness/exacerbation and 3+ comorbidities: chest pain w/ heart cath 08/15/23; asthma, anxiety/depression, HTN, IBS, migraines, GERD are also affecting patient's functional outcome.   REHAB POTENTIAL: Fair given chronicity and comorbidities  CLINICAL DECISION MAKING: Evolving/moderate complexity  EVALUATION COMPLEXITY: Moderate   GOALS:   SHORT TERM GOALS: Target date: 10/02/2023  Pt will demonstrate appropriate understanding and performance of initially prescribed HEP in order to facilitate improved independence with management of symptoms.  Baseline: HEP established  10/23/23: reports fair HEP adherence Goal status: ONGOING  2. Pt will report at least 25% improvement in overall pain levels over past week in order to facilitate improved tolerance to typical daily activities.   Baseline: 2-6/10  10/23/23: 3-9/10  Goal status: ONGOING  LONG TERM GOALS: Target date: 12/18/2023  (updated 10/23/23)   Pt will score less than or equal to 19/50 on NDI in order to demonstrate improved perception of function due to symptoms ([facility] 10-13 pts per Neysa dunker al 2009, 2010). Baseline: 29/50 10/23/23: 32/50 Goal status: ONGOING  2. Pt will report at least 50% improvement with work activities in regards to neck/UE pain. Baseline: reports difficulty/pain with work tasks 10/23/23: reports symptoms about the same Goal status: ONGOING  3. Pt will demonstrate grossly symmetrical shoulder/elbow MMT for improved symmetry of UE strength and improved tolerance to functional movements.  Baseline: see MMT chart above Goal status: ONGOING  4. Pt will demonstrate at least 40# of grip strength on RUE in order to facilitate improved functional strengthening. Baseline: see MMT chart above 10/23/23: see MMT chart above Goal status: ONGOING  5. Pt will report at least 50% decrease in overall pain levels in past week in order to facilitate improved tolerance to basic ADLs/mobility.   Baseline: 2-6/10  10/23/23: 3-9/10  Goal status: ONGOING   PLAN: (updated 10/23/23)  PT FREQUENCY: 1-2x/week  PT DURATION: 8 weeks  PLANNED INTERVENTIONS: 02835- PT Re-evaluation, 97750- Physical Performance  Testing, 97110-Therapeutic exercises, 97530- Therapeutic activity, [id number]- Neuromuscular re-education, 97535- Self Care, [id number]- Manual therapy, Taping, Dry Needling, Joint mobilization, Spinal mobilization, Cryotherapy, and Moist heat  PLAN FOR NEXT SESSION: Review/update HEP PRN. Work on [employer] exercises as appropriate with emphasis on postural endurance, RUE strengthening, cervical mobility mindful of vertigo/dizziness hx. Symptom modification strategies as indicated/appropriate. Also mindful of recent cardiac hx   Alm DELENA Jenny PT, DPT 10/23/2023 5:05 PM

## 2023-10-30 ENCOUNTER — Ambulatory Visit (HOSPITAL_COMMUNITY): Admitting: Clinical

## 2023-10-30 DIAGNOSIS — F3132 Bipolar disorder, current episode depressed, moderate: Secondary | ICD-10-CM | POA: Diagnosis not present

## 2023-10-30 DIAGNOSIS — F411 Generalized anxiety disorder: Secondary | ICD-10-CM | POA: Diagnosis not present

## 2023-10-30 DIAGNOSIS — H5213 Myopia, bilateral: Secondary | ICD-10-CM | POA: Diagnosis not present

## 2023-10-30 NOTE — Progress Notes (Signed)
 Virtual Visit via Video Note   I connected with Bailey Hooper on 10/30/23 at  1:00 PM EDT by a video enabled telemedicine application and verified that I am speaking with the correct person using two identifiers.   Location: Patient: home Provider: office   I discussed the limitations of evaluation and management by telemedicine and the availability of in person appointments. The patient expressed understanding and agreed to proceed.   THERAPIST PROGRESS NOTE   Session Time: 1:00 PM-1:37 PM   Participation Level: Active   Behavioral Response: CasualAlertAnxious   Type of Therapy: Individual Therapy   Treatment Goals addressed: Coping   Interventions: CBT   Summary: Bailey Hooper is a 58 y.o. female who presents with Bipolar Disorder./ GAD. The OPT therapist worked with the patient for her scheduled OPT session. The OPT therapist utilized Motivational Interviewing to assist in creating therapeutic repore. The patient in the session was engaged and work in collaboration giving feedback about her triggers and symptoms over the past few weeks. The patient spoke about struggle with her disappointment in recently getting denial on her Disability claim and having to continue with an appeal to continue to try to get an approval for disability . The OPT therapist utilized Cognitive Behavioral Therapy through cognitive restructuring as well as worked with the patient on coping strategies to assist in management of mood and as she continues to work on family interactions,  finances, physical and mental health. The patient spoke about her improved awareness of where her stress boundaries are and being aware of when she needs to implement coping to create balance in her life. The patient spoke about trying to find a balance between her health needs and her work hours to manage rising costs.   Suicidal/Homicidal: Nowithout intent/plan   Therapist Response: The OPT therapist worked with the patient for the  patients scheduled session. The patient was engaged in her session and gave feedback in relation to triggers, symptoms, and behavior responses over the past few weeks. The patient spoke about getting a denial for her disability again and this creating frustration. The patient spoke about her plan to file an appeal and continue her efforts to get an approval for the disability claim. The OPT therapist worked with the patient utilizing an in session Cognitive Behavioral Therapy exercise. The patient was responsive in the session and verbalized,  At first I am not going to lie I was falling apart and it took me a good 6/7 days to get myself together and I have decided I am going to appeal and represent myself . The OPT therapist worked with the patient overviewing basic care needs including eating, sleeping, exercise, and hygenie. The patient identified her need to focus on herself and realization she cannot work outside of what is in her control. The patient spoke about working to not overwhelm herself and or by being overwhelmed by committing to family request. The patient spoke about her physical health concerns amplifying her stress primally around her Cardiac problem completing recently a monitoring system that she will be turing back in to her Cardiologist for ongoing treatment recommendations.The OPT therapist continued to work with the patient on mindfulness DBT and her work/life balance while promoting the patient continuing to follow the directives of her health professionals The patient will be active ongoing with PT over the next several weeks.The OPT therapist will continue treatment work with the patient in her next scheduled session.   Plan: Return again in 3 weeks.  Diagnosis:      Axis I: Bipolar Disorder/ GAD                             Axis II: No diagnosis   Collaboration of Care: No additional collaboration of care for this session.    Patient/Guardian was advised Release of  Information must be obtained prior to any record release in order to collaborate their care with an outside provider. Patient/Guardian was advised if they have not already done so to contact the registration department to sign all necessary forms in order for us  to release information regarding their care.    Consent: Patient/Guardian gives verbal consent for treatment and assignment of benefits for services provided during this visit. Patient/Guardian expressed understanding and agreed to proceed      I discussed the assessment and treatment plan with the patient. The patient was provided an opportunity to ask questions and all were answered. The patient agreed with the plan and demonstrated an understanding of the instructions.   The patient was advised to call back or seek an in-person evaluation if the symptoms worsen or if the condition fails to improve as anticipated.   I provided 37 minutes of non-face-to-face time during this encounter.   Jerel ONEIDA Pepper, LCSW   10/30/2023

## 2023-10-31 ENCOUNTER — Ambulatory Visit

## 2023-10-31 DIAGNOSIS — M6281 Muscle weakness (generalized): Secondary | ICD-10-CM | POA: Diagnosis not present

## 2023-10-31 DIAGNOSIS — M542 Cervicalgia: Secondary | ICD-10-CM

## 2023-10-31 NOTE — Therapy (Signed)
 OUTPATIENT PHYSICAL THERAPY CERVICAL RE- ASSESSMENT + RECERTIFICATION   Patient Name: BREDA BOND MRN: [id number] DOB:03-Jan-1966, 58 y.o., female Today's Date: 10/31/2023  END OF SESSION:     Past Medical History:  Diagnosis Date   Allergy    Anxiety    Arthritis    Asthma    Depression    Fibroids    GERD (gastroesophageal reflux disease)    Hiatal hernia    High cholesterol    Hypertension    IBS (irritable bowel syndrome)    Migraines    Non-alcoholic fatty liver disease    Prediabetes    Sleep apnea    cpap   Tobacco use    Vertigo    Past Surgical History:  Procedure Laterality Date   CESAREAN SECTION     COLONOSCOPY     FOOT SURGERY     Left foot   LEFT HEART CATH AND CORONARY ANGIOGRAPHY N/A 08/15/2023   Procedure: LEFT HEART CATH AND CORONARY ANGIOGRAPHY;  Surgeon: Mady Bruckner, MD;  Location: [facility];  Service: Cardiovascular;  Laterality: N/A;   Patient Active Problem List   Diagnosis Date Noted   Family history of dementia 08/16/2021   NAFLD (nonalcoholic fatty liver disease) 95/70/7977   H/O colonoscopy 06/16/2020   Hot flashes 04/27/2019   Bipolar disorder (HCC) 01/18/2019   Asthma 01/18/2019   Hyperlipidemia 07/14/2018   Tobacco abuse 11/29/2017   Benign paroxysmal positional vertigo 11/29/2017   Major depressive disorder, recurrent episode, moderate (HCC) 06/28/2009   GENITAL HERPES 01/17/2009   Migraine headache 01/17/2009   Essential hypertension, benign 01/17/2009   GERD (gastroesophageal reflux disease) 01/17/2009   Irritable bowel syndrome 01/17/2009   Heart murmur 01/17/2009    PCP: Delores Suzann HERO, MD  REFERRING PROVIDER: Louis Shove, MD  REFERRING DIAG: [phone] (ICD-10-CM) - Radiculopathy, cervical region  THERAPY DIAG:  Cervicalgia  Muscle weakness (generalized)  Rationale for Evaluation and Treatment: Rehabilitation  ONSET DATE: ~2021 initially, this episode February/March  SUBJECTIVE:                                                                                                                                                                                                         Per eval: Pt states she has had excruciating pain/numbness in UE, shooting. Pt states first episode occurred in 2021, received work up and resolved after a couple weeks. Occurred again in 2023, self resolved after a couple weeks.  Pt states this most recent episode began in February/march and has essentially resolved as far as UE symptoms  go. States she does still have some neck/shoulder pain. Thinks it may be provoked by lifting. States sometimes she will get headaches with neck pain, has history of migraines (since early adulthood).  She notes migraines actually seem to be improving over past couple of years. States pain will sometimes affect sleeping, difficulty positioning for sleep, side sleeping. States she was getting OT for her hand, states this is the same issue and she was told it's coming from her neck. Hand dominance: Right  SUBJECTIVE STATEMENT: 10/31/2023: Patient reporting that her pain is somewhat better today.    PERTINENT HISTORY:  chest pain w/ heart cath 08/15/23; asthma, anxiety/depression, HTN, IBS, migraines, GERD Pt states chest pain relieved by sublingual medication  PAIN:  Are you having pain: 7/10 at present  Per eval:  Location/description: neck, more R sided; sometimes has UE symptoms into hand Best-worst over past week: 2-6/10  - aggravating factors: lifting - otherwise difficult to describe - Easing factors:  heating pad, tylenol   PRECAUTIONS: recent cardiac hx  RED FLAGS: Reports dizziness and chest pain at times, both of which are reportedly resolved by sublingual medication - currently undergoing cardiology work up No difficulty swallowing, no double vision, no bowel/bladder changes. N/T as described above which have occurred on both extremities but typically episodic with one at a  time. Chronic headaches that are occasionally coincident with neck pain and numbness, but reportedly improving over last few years. Reports history of vertigo with head movement and sup<>sit    WEIGHT BEARING RESTRICTIONS: No  FALLS:  Has patient fallen in last 6 months? 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  DIAGNOSTIC FINDINGS:  07/19/23 cervical MRI: IMPRESSION: 1. 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 2. Moderate left foraminal stenosis at C5-C6 secondary to uncovertebral joint disease. 3. No high-grade canal stenosis.  PATIENT SURVEYS:  NDI: 29/50 ; 58%  10/23/23: 32/50   COGNITION: Overall cognitive status: Within functional limits for tasks assessed  SENSATION/NEURO: Light touch intact BIL UE, mildly reduced L C4-5 and R C7 Finger<>nose testing unremarkable Negative hoffmann and tromner sign BIL No ataxia with gait  POSTURE: rounded shoulders, mild lean to L   CERVICAL ROM:   ROM A/PROM (deg) eval  Flexion 65 deg   Extension 25 deg (transiently dizzy, she attributes to chronic vertigo)   Right lateral flexion 30 deg discomfort  Left lateral flexion 30 deg discomfort  Right rotation   Left rotation    (Blank rows = not tested) (Key: WFL = within functional limits not formally assessed, * = concordant pain, s = stiffness/stretching sensation, NT = not tested) Comment:   UPPER EXTREMITY ROM:  A/PROM Right eval Left eval  Shoulder flexion    Shoulder abduction    Shoulder internal rotation    Shoulder external rotation    Elbow flexion    Elbow extension    Wrist flexion    Wrist extension      (Blank rows = not tested) (Key: WFL = within functional limits not formally assessed, * = concordant pain, s = stiffness/stretching sensation, NT = not tested)  Comments:    UPPER EXTREMITY MMT:  MMT Right eval Left eval R/L 10/23/23  Shoulder  flexion 4 * 5   Shoulder extension     Shoulder abduction 5 5   Shoulder extension     Shoulder internal rotation     Shoulder external rotation     Elbow flexion 4 4   Elbow extension 3+ 4+   Grip strength 30# 40# 20#/25#  (Blank rows = not tested)  (Key: WFL = within functional limits not formally assessed, * = concordant pain, s = stiffness/stretching sensation, NT = not tested)  Comments:   CERVICAL SPECIAL TESTS:  deferred  FUNCTIONAL TESTS:  See grip strength above  TREATMENT DATE:   [facility] Adult PT Treatment:                                                DATE: 10/31/2023  Therapeutic Exercise: Doorway rhomboid stretch 2x30sec BIL cues for comfortable ROM and setup Pball flexion rollout x12 Scapular retraction + double ER 2x10 cues for posture and relaxing UT/grip  Shoulder extension 2 x 10 RTB  Shoulder abduction 2 x 10  Shoulder diagonal abduction 2 x 10 each  Walk walks with red TB around wrist x 5    [facility] Adult PT Treatment:                                                DATE: 10/23/23 Therapeutic Exercise: Doorway rhomboid stretch 2x30sec BIL cues for comfortable ROM and setup Pball flexion rollout x12 Scapular retraction + double ER 2x10 cues for posture and relaxing UT/grip   Therapeutic Activity: MSK assessment + education Discussion re: symptom behavior and functional tolerance since initial eval, re-assessment activities and discussion    [facility] Adult PT Treatment:                                                DATE: 09/04/23 Therapeutic Exercise: Scapular retraction; double ER + scap retraction; practice reps, HEP handout + education on appropriate performance and modification as indicated                                                                                                                             PATIENT EDUCATION:  Education details: rationale for interventions, HEP  Person educated: Patient Education method: Explanation, Demonstration, Tactile cues, Verbal cues Education comprehension: verbalized understanding, returned demonstration, verbal cues required, tactile cues required, and needs further education     HOME EXERCISE PROGRAM: Access Code: [email or web address] URL: [email or web address] Date: 09/04/2023 Prepared by: Alm Jenny  Exercises - Seated Scapular Retraction  - 2-3 x daily - 1 sets - 8-10 reps -  Shoulder External Rotation and Scapular Retraction  - 2-3 x daily - 1 sets - 8-10 reps  ASSESSMENT:  CLINICAL IMPRESSION: 10/31/2023: Reena had fair tolerance of today's treatment session, which focused on UQ strengthening. However, she responded best to mobility activities at start of session. We will continue to progress per POC as tolerated, in order to reach established rehab goals.    Per eval: Patient is a pleasant 58 y.o. woman who was seen today for physical therapy evaluation and treatment for neck pain/UE pain. Symptoms have fluctuated since ~2021 per pt report - tend to be exacerbated by lifting and neck movement. Does not endorse any cervical red flags, ongoing cardiac work up (refer to [facility] for details). On exam she demonstrates mild limitations in cervical mobility, good GH mobility, reduced RUE strength, and sensory impairments in UE. Tolerates exam/HEP well overall without increase in resting pain, no adverse events. Recommend trial of skilled PT to address aforementioned deficits with aim of improving functional tolerance and reducing pain with typical activities. Pt departs today's session in no acute distress, all voiced concerns/questions addressed appropriately from PT perspective.     OBJECTIVE IMPAIRMENTS: decreased activity tolerance, decreased  endurance, decreased mobility, decreased ROM, decreased strength, impaired perceived functional ability, impaired UE functional use, improper body mechanics, postural dysfunction, and pain.   ACTIVITY LIMITATIONS: carrying, lifting, and sleeping  PARTICIPATION LIMITATIONS: meal prep, cleaning, laundry, community activity, and occupation  PERSONAL FACTORS: Time since onset of injury/illness/exacerbation and 3+ comorbidities: chest pain w/ heart cath 08/15/23; asthma, anxiety/depression, HTN, IBS, migraines, GERD are also affecting patient's functional outcome.   REHAB POTENTIAL: Fair given chronicity and comorbidities  CLINICAL DECISION MAKING: Evolving/moderate complexity  EVALUATION COMPLEXITY: Moderate   GOALS:   SHORT TERM GOALS: Target date: 10/02/2023  Pt will demonstrate appropriate understanding and performance of initially prescribed HEP in order to facilitate improved independence with management of symptoms.  Baseline: HEP established  10/23/23: reports fair HEP adherence Goal status: ONGOING  2. Pt will report at least 25% improvement in overall pain levels over past week in order to facilitate improved tolerance to typical daily activities.   Baseline: 2-6/10  10/23/23: 3-9/10  Goal status: ONGOING  LONG TERM GOALS: Target date: 12/18/2023  (updated 10/23/23)   Pt will score less than or equal to 19/50 on NDI in order to demonstrate improved perception of function due to symptoms (MDC 10-13 pts per Neysa dunker al 2009, 2010). Baseline: 29/50 10/23/23: 32/50 Goal status: ONGOING  2. Pt will report at least 50% improvement with work activities in regards to neck/UE pain. Baseline: reports difficulty/pain with work tasks 10/23/23: reports symptoms about the same Goal status: ONGOING  3. Pt will demonstrate grossly symmetrical shoulder/elbow MMT for improved symmetry of UE strength and improved tolerance to functional movements.  Baseline: see MMT chart above Goal status:  ONGOING  4. Pt will demonstrate at least 40# of grip strength on RUE in order to facilitate improved functional strengthening. Baseline: see MMT chart above 10/23/23: see MMT chart above Goal status: ONGOING  5. Pt will report at least 50% decrease in overall pain levels in past week in order to facilitate improved tolerance to basic ADLs/mobility.   Baseline: 2-6/10  10/23/23: 3-9/10  Goal status: ONGOING   PLAN: (updated 10/23/23)  PT FREQUENCY: 1-2x/week  PT DURATION: 8 weeks  PLANNED INTERVENTIONS: 02835- PT Re-evaluation, 97750- Physical Performance Testing, 97110-Therapeutic exercises, 97530- Therapeutic activity, 97112- Neuromuscular re-education, 97535- Self Care, 02859- Manual therapy, Taping, Dry Needling, Joint mobilization,  Spinal mobilization, Cryotherapy, and Moist heat  PLAN FOR NEXT SESSION: Review/update HEP PRN. Work on [employer] exercises as appropriate with emphasis on postural endurance, RUE strengthening, cervical mobility mindful of vertigo/dizziness hx. Symptom modification strategies as indicated/appropriate. Also mindful of recent cardiac hx   Marko Molt, PT, DPT  10/31/2023 1:09 PM

## 2023-11-07 ENCOUNTER — Ambulatory Visit

## 2023-11-13 ENCOUNTER — Ambulatory Visit: Attending: Neurosurgery

## 2023-11-13 DIAGNOSIS — M542 Cervicalgia: Secondary | ICD-10-CM | POA: Diagnosis not present

## 2023-11-13 DIAGNOSIS — M79641 Pain in right hand: Secondary | ICD-10-CM | POA: Diagnosis not present

## 2023-11-13 DIAGNOSIS — M6281 Muscle weakness (generalized): Secondary | ICD-10-CM | POA: Diagnosis not present

## 2023-11-13 NOTE — Therapy (Signed)
 OUTPATIENT PHYSICAL THERAPY NOTE   Patient Name: Bailey Hooper MRN: [id number] DOB:10-26-1965, 58 y.o., female Today's Date: 11/13/2023  END OF SESSION:     Past Medical History:  Diagnosis Date   Allergy    Anxiety    Arthritis    Asthma    Depression    Fibroids    GERD (gastroesophageal reflux disease)    Hiatal hernia    High cholesterol    Hypertension    IBS (irritable bowel syndrome)    Migraines    Non-alcoholic fatty liver disease    Prediabetes    Sleep apnea    cpap   Tobacco use    Vertigo    Past Surgical History:  Procedure Laterality Date   CESAREAN SECTION     COLONOSCOPY     FOOT SURGERY     Left foot   LEFT HEART CATH AND CORONARY ANGIOGRAPHY N/A 08/15/2023   Procedure: LEFT HEART CATH AND CORONARY ANGIOGRAPHY;  Surgeon: Mady Bruckner, MD;  Location: [facility];  Service: Cardiovascular;  Laterality: N/A;   Patient Active Problem List   Diagnosis Date Noted   Family history of dementia 08/16/2021   NAFLD (nonalcoholic fatty liver disease) 95/70/7977   H/O colonoscopy 06/16/2020   Hot flashes 04/27/2019   Bipolar disorder (HCC) 01/18/2019   Asthma 01/18/2019   Hyperlipidemia 07/14/2018   Tobacco abuse 11/29/2017   Benign paroxysmal positional vertigo 11/29/2017   Major depressive disorder, recurrent episode, moderate (HCC) 06/28/2009   GENITAL HERPES 01/17/2009   Migraine headache 01/17/2009   Essential hypertension, benign 01/17/2009   GERD (gastroesophageal reflux disease) 01/17/2009   Irritable bowel syndrome 01/17/2009   Heart murmur 01/17/2009    PCP: Delores Suzann HERO, MD  REFERRING PROVIDER: Louis Shove, MD  REFERRING DIAG: [phone] (ICD-10-CM) - Radiculopathy, cervical region  THERAPY DIAG:  Cervicalgia  Muscle weakness (generalized)  Pain in right hand  Rationale for Evaluation and Treatment: Rehabilitation  ONSET DATE: ~2021 initially, this episode February/March  SUBJECTIVE:                                                                                                                                                                                                         Per eval: Pt states she has had excruciating pain/numbness in UE, shooting. Pt states first episode occurred in 2021, received work up and resolved after a couple weeks. Occurred again in 2023, self resolved after a couple weeks.  Pt states this most recent episode began in February/march and has essentially resolved as far as UE  symptoms go. States she does still have some neck/shoulder pain. Thinks it may be provoked by lifting. States sometimes she will get headaches with neck pain, has history of migraines (since early adulthood).  She notes migraines actually seem to be improving over past couple of years. States pain will sometimes affect sleeping, difficulty positioning for sleep, side sleeping. States she was getting OT for her hand, states this is the same issue and she was told it's coming from her neck. Hand dominance: Right  SUBJECTIVE STATEMENT: 11/13/2023: Patient reporting that her pain is somewhat better today.    PERTINENT HISTORY:  chest pain w/ heart cath 08/15/23; asthma, anxiety/depression, HTN, IBS, migraines, GERD Pt states chest pain relieved by sublingual medication  PAIN:  Are you having pain: 7/10 at present  Per eval:  Location/description: neck, more R sided; sometimes has UE symptoms into hand Best-worst over past week: 2-6/10  - aggravating factors: lifting - otherwise difficult to describe - Easing factors:  heating pad, tylenol   PRECAUTIONS: recent cardiac hx  RED FLAGS: Reports dizziness and chest pain at times, both of which are reportedly resolved by sublingual medication - currently undergoing cardiology work up No difficulty swallowing, no double vision, no bowel/bladder changes. N/T as described above which have occurred on both extremities but typically episodic with one at a time. Chronic  headaches that are occasionally coincident with neck pain and numbness, but reportedly improving over last few years. Reports history of vertigo with head movement and sup<>sit    WEIGHT BEARING RESTRICTIONS: No  FALLS:  Has patient fallen in last 6 months? 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  DIAGNOSTIC FINDINGS:  07/19/23 cervical MRI: IMPRESSION: 1. 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 2. Moderate left foraminal stenosis at C5-C6 secondary to uncovertebral joint disease. 3. No high-grade canal stenosis.  PATIENT SURVEYS:  NDI: 29/50 ; 58%  10/23/23: 32/50   COGNITION: Overall cognitive status: Within functional limits for tasks assessed  SENSATION/NEURO: Light touch intact BIL UE, mildly reduced L C4-5 and R C7 Finger<>nose testing unremarkable Negative hoffmann and tromner sign BIL No ataxia with gait  POSTURE: rounded shoulders, mild lean to L   CERVICAL ROM:   ROM A/PROM (deg) eval  Flexion 65 deg   Extension 25 deg (transiently dizzy, she attributes to chronic vertigo)   Right lateral flexion 30 deg discomfort  Left lateral flexion 30 deg discomfort  Right rotation   Left rotation    (Blank rows = not tested) (Key: WFL = within functional limits not formally assessed, * = concordant pain, s = stiffness/stretching sensation, NT = not tested) Comment:   UPPER EXTREMITY ROM:  A/PROM Right eval Left eval  Shoulder flexion    Shoulder abduction    Shoulder internal rotation    Shoulder external rotation    Elbow flexion    Elbow extension    Wrist flexion    Wrist extension     (Blank rows =  not tested) (Key: WFL = within functional limits not formally assessed, * = concordant pain, s = stiffness/stretching sensation, NT = not tested)  Comments:    UPPER EXTREMITY MMT:  MMT Right eval Left eval R/L 10/23/23  Shoulder flexion 4 * 5   Shoulder extension     Shoulder abduction 5 5   Shoulder extension     Shoulder internal rotation     Shoulder external rotation     Elbow flexion 4 4   Elbow extension 3+ 4+   Grip strength 30# 40# 20#/25#  (Blank rows = not tested)  (Key: WFL = within functional limits not formally assessed, * = concordant pain, s = stiffness/stretching sensation, NT = not tested)  Comments:   CERVICAL SPECIAL TESTS:  deferred  FUNCTIONAL TESTS:  See grip strength above  TREATMENT DATE:   [facility] Adult PT Treatment:                                                DATE: 11/13/2023  Therapeutic Exercise: Pball flexion rollout x12 Scapular retraction + double ER 2x10 cues for posture and relaxing UT/grip  Shoulder extension 2 x 10 RTB  Shoulder abduction 2 x 10  Shoulder diagonal abduction 2 x 10 each   Manual Therapy  STM/myofascial trigger point release to R Upper Trapezius, levator R UT pin and stretching  CS distraction   [facility] Adult PT Treatment:                                                DATE: 10/31/2023  Therapeutic Exercise: Doorway rhomboid stretch 2x30sec BIL cues for comfortable ROM and setup Pball flexion rollout x12 Scapular retraction + double ER 2x10 cues for posture and relaxing UT/grip  Shoulder extension 2 x 10 RTB  Shoulder abduction 2 x 10  Shoulder diagonal abduction 2 x 10 each  Walk walks with red TB around wrist x 5    [facility] Adult PT Treatment:                                                DATE: 10/23/23 Therapeutic Exercise: Doorway rhomboid stretch 2x30sec BIL cues for comfortable ROM and setup Pball flexion rollout x12 Scapular retraction + double ER 2x10 cues for posture and relaxing UT/grip   Therapeutic  Activity: MSK assessment + education Discussion re: symptom behavior and functional tolerance since initial eval, re-assessment activities and discussion                                                                                                                       PATIENT EDUCATION:  Education details: rationale for interventions, HEP  Person educated: Patient Education method: Explanation, Demonstration, Tactile cues, Verbal cues Education comprehension: verbalized understanding, returned demonstration, verbal cues required, tactile cues required, and needs further education  HOME EXERCISE PROGRAM: Access Code: [email or web address] URL: [email or web address] Date: 09/04/2023 Prepared by: Alm Jenny  Exercises - Seated Scapular Retraction  - 2-3 x daily - 1 sets - 8-10 reps - Shoulder External Rotation and Scapular Retraction  - 2-3 x daily - 1 sets - 8-10 reps  ASSESSMENT:  CLINICAL IMPRESSION: 11/13/2023: Bailey Hooper had good tolerance of today's treatment session, with manual therapy to address increased mm tension with today's activities. We will continue with focus on active interventions to promote independence with symptom management. We will continue to progress per POC as tolerated, in order to reach established rehab goals.    Per eval: Patient is a pleasant 59 y.o. woman who was seen today for physical therapy evaluation and treatment for neck pain/UE pain. Symptoms have fluctuated since ~2021 per pt report - tend to be exacerbated by lifting and neck movement. Does not endorse any cervical red flags, ongoing cardiac work up (refer to [facility] for details). On exam she demonstrates mild limitations in cervical mobility, good GH mobility, reduced RUE strength, and sensory impairments in UE. Tolerates exam/HEP well overall without increase in resting pain, no adverse events. Recommend trial of skilled PT to address aforementioned deficits with aim of improving functional  tolerance and reducing pain with typical activities. Pt departs today's session in no acute distress, all voiced concerns/questions addressed appropriately from PT perspective.     OBJECTIVE IMPAIRMENTS: decreased activity tolerance, decreased endurance, decreased mobility, decreased ROM, decreased strength, impaired perceived functional ability, impaired UE functional use, improper body mechanics, postural dysfunction, and pain.   ACTIVITY LIMITATIONS: carrying, lifting, and sleeping  PARTICIPATION LIMITATIONS: meal prep, cleaning, laundry, community activity, and occupation  PERSONAL FACTORS: Time since onset of injury/illness/exacerbation and 3+ comorbidities: chest pain w/ heart cath 08/15/23; asthma, anxiety/depression, HTN, IBS, migraines, GERD are also affecting patient's functional outcome.   REHAB POTENTIAL: Fair given chronicity and comorbidities  CLINICAL DECISION MAKING: Evolving/moderate complexity  EVALUATION COMPLEXITY: Moderate   GOALS:   SHORT TERM GOALS: Target date: 10/02/2023  Pt will demonstrate appropriate understanding and performance of initially prescribed HEP in order to facilitate improved independence with management of symptoms.  Baseline: HEP established  10/23/23: reports fair HEP adherence Goal status: ONGOING  2. Pt will report at least 25% improvement in overall pain levels over past week in order to facilitate improved tolerance to typical daily activities.   Baseline: 2-6/10  10/23/23: 3-9/10  Goal status: ONGOING  LONG TERM GOALS: Target date: 12/18/2023  (updated 10/23/23)   Pt will score less than or equal to 19/50 on NDI in order to demonstrate improved perception of function due to symptoms (MDC 10-13 pts per Neysa dunker al 2009, 2010). Baseline: 29/50 10/23/23: 32/50 Goal status: ONGOING  2. Pt will report at least 50% improvement with work activities in regards to neck/UE pain. Baseline: reports difficulty/pain with work tasks 10/23/23: reports  symptoms about the same Goal status: ONGOING  3. Pt will demonstrate grossly symmetrical shoulder/elbow MMT for improved symmetry of UE strength and improved tolerance to functional movements.  Baseline: see MMT chart above Goal status: ONGOING  4. Pt will demonstrate at least 40# of grip strength on RUE in order to facilitate improved functional strengthening. Baseline: see MMT chart above 10/23/23: see MMT chart above Goal status: ONGOING  5. Pt will report at least 50% decrease in overall pain levels in past week in order to facilitate improved tolerance to basic ADLs/mobility.   Baseline: 2-6/10  10/23/23: 3-9/10  Goal status: ONGOING  PLAN: (updated 10/23/23)  PT FREQUENCY: 1-2x/week  PT DURATION: 8 weeks  PLANNED INTERVENTIONS: 97164- PT Re-evaluation, 97750- Physical Performance Testing, 97110-Therapeutic exercises, 97530- Therapeutic activity, 97112- Neuromuscular re-education, 97535- Self Care, [id number]- Manual therapy, Taping, Dry Needling, Joint mobilization, Spinal mobilization, Cryotherapy, and Moist heat  PLAN FOR NEXT SESSION: Review/update HEP PRN. Work on [employer] exercises as appropriate with emphasis on postural endurance, RUE strengthening, cervical mobility mindful of vertigo/dizziness hx. Symptom modification strategies as indicated/appropriate. Also mindful of recent cardiac hx   Marko Molt, PT, DPT  11/16/2023 8:12 AM

## 2023-11-14 ENCOUNTER — Ambulatory Visit (HOSPITAL_BASED_OUTPATIENT_CLINIC_OR_DEPARTMENT_OTHER): Admitting: Nurse Practitioner

## 2023-11-18 NOTE — Telephone Encounter (Signed)
 Pt said she never received a call to get PIN for the sleep study. She still has device and she changed her mind and wants to do sleep study at the sleep clinic.

## 2023-11-19 ENCOUNTER — Telehealth: Payer: Self-pay

## 2023-11-19 DIAGNOSIS — I2510 Atherosclerotic heart disease of native coronary artery without angina pectoris: Secondary | ICD-10-CM

## 2023-11-19 DIAGNOSIS — G4733 Obstructive sleep apnea (adult) (pediatric): Secondary | ICD-10-CM

## 2023-11-19 DIAGNOSIS — R002 Palpitations: Secondary | ICD-10-CM

## 2023-11-19 DIAGNOSIS — H5203 Hypermetropia, bilateral: Secondary | ICD-10-CM | POA: Diagnosis not present

## 2023-11-19 DIAGNOSIS — I10 Essential (primary) hypertension: Secondary | ICD-10-CM

## 2023-11-19 DIAGNOSIS — R42 Dizziness and giddiness: Secondary | ICD-10-CM

## 2023-11-19 NOTE — Telephone Encounter (Signed)
**Note De-Identified Manisha Cancel Obfuscation** Please see phone note in the pts chart from 11/19/23 (Split Night Sleep Study [facility]) for update.

## 2023-11-19 NOTE — Telephone Encounter (Addendum)
**Note De-Identified Norva Bowe Obfuscation** Per the [place] [employer] [place] website:   I have transferred the order to the Sleep Lab and I have notified the pt of this outcome. I did provide her with the [facility] phone number so she can call to schedule her Split Night Sleep Study.  I also advised the pt that per Rosaline Bane, NP she could possibly follow-up with Dr. Shlomo since she has sleep apnea and Dr Shlomo is at the [facility] office which is closer to the pts home.   The pt states that she will return the [facility] One-HST device in its unopened original box to the office.

## 2023-11-19 NOTE — Telephone Encounter (Signed)
 Lets have her do the split night sleep study. She could possibly follow-up with Dr. Shlomo since she has sleep apnea

## 2023-11-20 ENCOUNTER — Ambulatory Visit

## 2023-11-20 DIAGNOSIS — M6281 Muscle weakness (generalized): Secondary | ICD-10-CM | POA: Diagnosis not present

## 2023-11-20 DIAGNOSIS — M542 Cervicalgia: Secondary | ICD-10-CM | POA: Diagnosis not present

## 2023-11-20 DIAGNOSIS — M79641 Pain in right hand: Secondary | ICD-10-CM | POA: Diagnosis not present

## 2023-11-20 NOTE — Therapy (Signed)
 OUTPATIENT PHYSICAL THERAPY NOTE   Patient Name: Bailey Hooper MRN: [id number] DOB:1965/08/08, 58 y.o., female Today's Date: 11/20/2023  END OF SESSION:  Visit Number 5 Number of Visits 17 Date for PT re-eval 12/18/2023  Authorization Type [employer] Authorization Time Period auth after 27VL  PT start time 1217 PT stop time 1255 PT time calculation (min) 38 min      Past Medical History:  Diagnosis Date   Allergy    Anxiety    Arthritis    Asthma    Depression    Fibroids    GERD (gastroesophageal reflux disease)    Hiatal hernia    High cholesterol    Hypertension    IBS (irritable bowel syndrome)    Migraines    Non-alcoholic fatty liver disease    Prediabetes    Sleep apnea    cpap   Tobacco use    Vertigo    Past Surgical History:  Procedure Laterality Date   CESAREAN SECTION     COLONOSCOPY     FOOT SURGERY     Left foot   LEFT HEART CATH AND CORONARY ANGIOGRAPHY N/A 08/15/2023   Procedure: LEFT HEART CATH AND CORONARY ANGIOGRAPHY;  Surgeon: Mady Bruckner, MD;  Location: [facility];  Service: Cardiovascular;  Laterality: N/A;   Patient Active Problem List   Diagnosis Date Noted   Family history of dementia 08/16/2021   NAFLD (nonalcoholic fatty liver disease) 95/70/7977   H/O colonoscopy 06/16/2020   Hot flashes 04/27/2019   Bipolar disorder (HCC) 01/18/2019   Asthma 01/18/2019   Hyperlipidemia 07/14/2018   Tobacco abuse 11/29/2017   Benign paroxysmal positional vertigo 11/29/2017   Major depressive disorder, recurrent episode, moderate (HCC) 06/28/2009   GENITAL HERPES 01/17/2009   Migraine headache 01/17/2009   Essential hypertension, benign 01/17/2009   GERD (gastroesophageal reflux disease) 01/17/2009   Irritable bowel syndrome 01/17/2009   Heart murmur 01/17/2009    PCP: Delores Suzann HERO, MD  REFERRING PROVIDER: Louis Shove, MD  REFERRING DIAG: [phone] (ICD-10-CM) - Radiculopathy, cervical region  THERAPY DIAG:   Cervicalgia  Muscle weakness (generalized)  Rationale for Evaluation and Treatment: Rehabilitation  ONSET DATE: ~2021 initially, this episode February/March  SUBJECTIVE:                                                                                                                                                                                                        Per eval: Pt states she has had excruciating pain/numbness in UE, shooting. Pt states first episode occurred in 2021, received work  up and resolved after a couple weeks. Occurred again in 2023, self resolved after a couple weeks.  Pt states this most recent episode began in February/march and has essentially resolved as far as UE symptoms go. States she does still have some neck/shoulder pain. Thinks it may be provoked by lifting. States sometimes she will get headaches with neck pain, has history of migraines (since early adulthood).  She notes migraines actually seem to be improving over past couple of years. States pain will sometimes affect sleeping, difficulty positioning for sleep, side sleeping. States she was getting OT for her hand, states this is the same issue and she was told it's coming from her neck. Hand dominance: Right  SUBJECTIVE STATEMENT: 11/20/2023: Patient reporting that her pain is 7/10 pain. She continues to feel limited with household activities, and carrying groceries, requiring some help or extra breaks.   PERTINENT HISTORY:  chest pain w/ heart cath 08/15/23; asthma, anxiety/depression, HTN, IBS, migraines, GERD Pt states chest pain relieved by sublingual medication  PAIN:  Are you having pain: 7/10 at present  Per eval:  Location/description: neck, more R sided; sometimes has UE symptoms into hand Best-worst over past week: 2-6/10  - aggravating factors: lifting - otherwise difficult to describe - Easing factors:  heating pad, tylenol   PRECAUTIONS: recent cardiac hx  RED FLAGS: Reports  dizziness and chest pain at times, both of which are reportedly resolved by sublingual medication - currently undergoing cardiology work up No difficulty swallowing, no double vision, no bowel/bladder changes. N/T as described above which have occurred on both extremities but typically episodic with one at a time. Chronic headaches that are occasionally coincident with neck pain and numbness, but reportedly improving over last few years. Reports history of vertigo with head movement and sup<>sit    WEIGHT BEARING RESTRICTIONS: No  FALLS:  Has patient fallen in last 6 months? 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  DIAGNOSTIC FINDINGS:  07/19/23 cervical MRI: IMPRESSION: 1. 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 2. Moderate left foraminal stenosis at C5-C6 secondary to uncovertebral joint disease. 3. No high-grade canal stenosis.  PATIENT SURVEYS:  NDI: 29/50 ; 58%  10/23/23: 32/50   COGNITION: Overall cognitive status: Within functional limits for tasks assessed  SENSATION/NEURO: Light touch intact BIL UE, mildly reduced L C4-5 and R C7 Finger<>nose testing unremarkable Negative hoffmann and tromner sign BIL No ataxia with gait  POSTURE: rounded shoulders, mild lean to L   CERVICAL ROM:   ROM A/PROM (deg) eval  Flexion 65 deg   Extension 25 deg (transiently dizzy, she attributes to chronic vertigo)   Right lateral flexion 30 deg discomfort  Left lateral flexion 30 deg discomfort  Right rotation   Left rotation    (Blank rows = not tested) (Key: WFL = within functional limits not formally assessed, * = concordant  pain, s = stiffness/stretching sensation, NT = not tested) Comment:   UPPER EXTREMITY ROM:  A/PROM Right eval Left eval  Shoulder flexion    Shoulder abduction    Shoulder internal rotation    Shoulder external rotation    Elbow flexion    Elbow extension    Wrist flexion  Wrist extension     (Blank rows = not tested) (Key: WFL = within functional limits not formally assessed, * = concordant pain, s = stiffness/stretching sensation, NT = not tested)  Comments:    UPPER EXTREMITY MMT:  MMT Right eval Left eval R/L 10/23/23  Shoulder flexion 4 * 5   Shoulder extension     Shoulder abduction 5 5   Shoulder extension     Shoulder internal rotation     Shoulder external rotation     Elbow flexion 4 4   Elbow extension 3+ 4+   Grip strength 30# 40# 20#/25#  (Blank rows = not tested)  (Key: WFL = within functional limits not formally assessed, * = concordant pain, s = stiffness/stretching sensation, NT = not tested)  Comments:   CERVICAL SPECIAL TESTS:  deferred  FUNCTIONAL TESTS:  See grip strength above  TREATMENT DATE:   [facility] Adult PT Treatment:                                                DATE: 11/20/2023  Therapeutic Exercise: UBE level 1, fwd/back  Pball flexion rollout x12 Scapular retraction + double ER 2x10 cues for posture and relaxing UT/grip  Shoulder extension 2 x 10 RTB  Shoulder abduction 2 x 10  Shoulder diagonal abduction 2 x 10 each   Manual Therapy  STM/myofascial trigger point release to R Upper Trapezius, levator R UT pin and stretching   [facility] Adult PT Treatment:                                                DATE: 11/13/2023  Therapeutic Exercise: Pball flexion rollout x12 Scapular retraction + double ER 2x10 cues for posture and relaxing UT/grip  Shoulder extension 2 x 10 RTB  Shoulder abduction 2 x 10  Shoulder diagonal abduction 2 x 10 each   Manual Therapy  STM/myofascial trigger point release to R Upper Trapezius,  levator R UT pin and stretching  CS distraction   [facility] Adult PT Treatment:                                                DATE: 10/31/2023  Therapeutic Exercise: Doorway rhomboid stretch 2x30sec BIL cues for comfortable ROM and setup Pball flexion rollout x12 Scapular retraction + double ER 2x10 cues for posture and relaxing UT/grip  Shoulder extension 2 x 10 RTB  Shoulder abduction 2 x 10  Shoulder diagonal abduction 2 x 10 each  Walk walks with red TB around wrist x 5    [facility] Adult PT Treatment:                                                DATE: 10/23/23 Therapeutic Exercise: Doorway rhomboid stretch 2x30sec BIL cues for comfortable ROM and setup Pball flexion rollout x12 Scapular retraction + double ER 2x10 cues for posture and relaxing UT/grip   Therapeutic Activity: MSK assessment + education  Discussion re: symptom behavior and functional tolerance since initial eval, re-assessment activities and discussion                                                                                                                       PATIENT EDUCATION:  Education details: rationale for interventions, HEP  Person educated: Patient Education method: Explanation, Demonstration, Tactile cues, Verbal cues Education comprehension: verbalized understanding, returned demonstration, verbal cues required, tactile cues required, and needs further education     HOME EXERCISE PROGRAM: Access Code: [email or web address] URL: [email or web address] Date: 09/04/2023 Prepared by: Alm Jenny  Exercises - Seated Scapular Retraction  - 2-3 x daily - 1 sets - 8-10 reps - Shoulder External Rotation and Scapular Retraction  - 2-3 x daily - 1 sets - 8-10 reps  ASSESSMENT:  CLINICAL IMPRESSION: 11/20/2023: Reena had good tolerance of today's treatment session, with manual therapy to address increased mm tension with today's activities. We will continue with focus on active interventions to  promote independence with symptom management. We will continue to progress per POC as tolerated, in order to reach established rehab goals.    Per eval: Patient is a pleasant 58 y.o. woman who was seen today for physical therapy evaluation and treatment for neck pain/UE pain. Symptoms have fluctuated since ~2021 per pt report - tend to be exacerbated by lifting and neck movement. Does not endorse any cervical red flags, ongoing cardiac work up (refer to [facility] for details). On exam she demonstrates mild limitations in cervical mobility, good GH mobility, reduced RUE strength, and sensory impairments in UE. Tolerates exam/HEP well overall without increase in resting pain, no adverse events. Recommend trial of skilled PT to address aforementioned deficits with aim of improving functional tolerance and reducing pain with typical activities. Pt departs today's session in no acute distress, all voiced concerns/questions addressed appropriately from PT perspective.     OBJECTIVE IMPAIRMENTS: decreased activity tolerance, decreased endurance, decreased mobility, decreased ROM, decreased strength, impaired perceived functional ability, impaired UE functional use, improper body mechanics, postural dysfunction, and pain.   ACTIVITY LIMITATIONS: carrying, lifting, and sleeping  PARTICIPATION LIMITATIONS: meal prep, cleaning, laundry, community activity, and occupation  PERSONAL FACTORS: Time since onset of injury/illness/exacerbation and 3+ comorbidities: chest pain w/ heart cath 08/15/23; asthma, anxiety/depression, HTN, IBS, migraines, GERD are also affecting patient's functional outcome.   REHAB POTENTIAL: Fair given chronicity and comorbidities  CLINICAL DECISION MAKING: Evolving/moderate complexity  EVALUATION COMPLEXITY: Moderate   GOALS:   SHORT TERM GOALS: Target date: 10/02/2023  Pt will demonstrate appropriate understanding and performance of initially prescribed HEP in order to facilitate  improved independence with management of symptoms.  Baseline: HEP established  10/23/23: reports fair HEP adherence Goal status: ONGOING  2. Pt will report at least 25% improvement in overall pain levels over past week in order to facilitate improved tolerance to typical daily activities.   Baseline: 2-6/10  10/23/23: 3-9/10  Goal status: ONGOING  LONG TERM GOALS: Target date: 12/18/2023  (updated 10/23/23)  Pt will score less than or equal to 19/50 on NDI in order to demonstrate improved perception of function due to symptoms ([facility] 10-13 pts per Neysa dunker al 2009, 2010). Baseline: 29/50 10/23/23: 32/50 Goal status: ONGOING  2. Pt will report at least 50% improvement with work activities in regards to neck/UE pain. Baseline: reports difficulty/pain with work tasks 10/23/23: reports symptoms about the same Goal status: ONGOING  3. Pt will demonstrate grossly symmetrical shoulder/elbow MMT for improved symmetry of UE strength and improved tolerance to functional movements.  Baseline: see MMT chart above Goal status: ONGOING  4. Pt will demonstrate at least 40# of grip strength on RUE in order to facilitate improved functional strengthening. Baseline: see MMT chart above 10/23/23: see MMT chart above Goal status: ONGOING  5. Pt will report at least 50% decrease in overall pain levels in past week in order to facilitate improved tolerance to basic ADLs/mobility.   Baseline: 2-6/10  10/23/23: 3-9/10  Goal status: ONGOING   PLAN: (updated 10/23/23)  PT FREQUENCY: 1-2x/week  PT DURATION: 8 weeks  PLANNED INTERVENTIONS: 97164- PT Re-evaluation, 97750- Physical Performance Testing, 97110-Therapeutic exercises, 97530- Therapeutic activity, 97112- Neuromuscular re-education, 97535- Self Care, [id number]- Manual therapy, Taping, Dry Needling, Joint mobilization, Spinal mobilization, Cryotherapy, and Moist heat  PLAN FOR NEXT SESSION: Review/update HEP PRN. Work on [employer] exercises as  appropriate with emphasis on postural endurance, RUE strengthening, cervical mobility mindful of vertigo/dizziness hx. Symptom modification strategies as indicated/appropriate. Also mindful of recent cardiac hx   Marko Molt, PT, DPT  11/21/2023 9:41 PM

## 2023-12-03 ENCOUNTER — Other Ambulatory Visit: Payer: Self-pay | Admitting: Family Medicine

## 2023-12-03 DIAGNOSIS — I10 Essential (primary) hypertension: Secondary | ICD-10-CM

## 2023-12-03 DIAGNOSIS — Z720 Tobacco use: Secondary | ICD-10-CM

## 2023-12-04 ENCOUNTER — Ambulatory Visit (HOSPITAL_COMMUNITY): Admitting: Clinical

## 2023-12-04 ENCOUNTER — Ambulatory Visit: Payer: Self-pay

## 2023-12-04 DIAGNOSIS — M542 Cervicalgia: Secondary | ICD-10-CM | POA: Diagnosis not present

## 2023-12-04 DIAGNOSIS — F319 Bipolar disorder, unspecified: Secondary | ICD-10-CM

## 2023-12-04 DIAGNOSIS — F411 Generalized anxiety disorder: Secondary | ICD-10-CM

## 2023-12-04 DIAGNOSIS — F3132 Bipolar disorder, current episode depressed, moderate: Secondary | ICD-10-CM

## 2023-12-04 DIAGNOSIS — M6281 Muscle weakness (generalized): Secondary | ICD-10-CM | POA: Diagnosis not present

## 2023-12-04 DIAGNOSIS — M79641 Pain in right hand: Secondary | ICD-10-CM | POA: Diagnosis not present

## 2023-12-04 NOTE — Progress Notes (Signed)
 Virtual Visit via Video Note   I connected with Bailey Hooper on 12/04/23 at  1:00 PM EDT by a video enabled telemedicine application and verified that I am speaking with the correct person using two identifiers.   Location: Patient: home Provider: office   I discussed the limitations of evaluation and management by telemedicine and the availability of in person appointments. The patient expressed understanding and agreed to proceed.   THERAPIST PROGRESS NOTE   Session Time: 1:00 PM-1:30 PM   Participation Level: Active   Behavioral Response: CasualAlertAnxious   Type of Therapy: Individual Therapy   Treatment Goals addressed: Coping   Interventions: CBT   Summary: Bailey Hooper is a 59 y.o. female who presents with Bipolar Disorder./ GAD. The OPT therapist worked with the patient for her scheduled OPT session. The OPT therapist utilized Motivational Interviewing to assist in creating therapeutic repore. The patient in the session was engaged and work in collaboration giving feedback about her triggers and symptoms over the past few weeks. The patient spoke about involvement in her Physical Therapy program and spoke about the potential of having surgery on her neck . The OPT therapist utilized Cognitive Behavioral Therapy through cognitive restructuring as well as worked with the patient on coping strategies to assist in management of mood and as she continues to work on family interactions,  finances, physical and mental health. The patient spoke about her improved awareness of where her stress boundaries are and being aware of when she needs to implement coping to create balance in her life. The patient spoke about trying to find a balance between her health needs and her work hours to manage rising costs. The patient spoke about the impact of being back on talking terms with her daughter and involvement with her grandchildren.   Suicidal/Homicidal: Nowithout intent/plan   Therapist  Response: The OPT therapist worked with the patient for the patients scheduled session. The patient was engaged in her session and gave feedback in relation to triggers, symptoms, and behavior responses over the past few weeks.The patient spoke about  frustration around the political climate. The patient spoke about her plan to file an appeal and continue her efforts to get an approval for the disability claim. The OPT therapist worked with the patient utilizing an in session Cognitive Behavioral Therapy exercise. The patient was responsive in the session and verbalized,  I have been doing my Physical Therapy and I know there is a recommendation for neck surgery but I just dont know if I am ready for all that . The OPT therapist worked with the patient overviewing basic care needs including eating, sleeping, exercise, and hygenie. The patient identified her need to focus on herself and realization she cannot work outside of what is in her control. The patient spoke about working to not overwhelm herself and or by being overwhelmed by committing to family request. The patient spoke about her physical health concerns amplifying her stress primally around her Cardiac problems  The patient spoke about her Cardiac appointment being changed from August to September. The patient spoke about making gains in her relationship with her daughter and this being a access pathway to the patient being able to have interactions with her grandchildren.The OPT therapist continued to work with the patient on mindfulness DBT and her work/life balance while promoting the patient continuing to follow the directives of her health professionals .The OPT therapist will continue treatment work with the patient in her next scheduled session.  Plan: Return again in 3 weeks.   Diagnosis:      Axis I: Bipolar Disorder/ GAD                             Axis II: No diagnosis   Collaboration of Care: No additional collaboration of care for  this session.    Patient/Guardian was advised Release of Information must be obtained prior to any record release in order to collaborate their care with an outside provider. Patient/Guardian was advised if they have not already done so to contact the registration department to sign all necessary forms in order for us  to release information regarding their care.    Consent: Patient/Guardian gives verbal consent for treatment and assignment of benefits for services provided during this visit. Patient/Guardian expressed understanding and agreed to proceed      I discussed the assessment and treatment plan with the patient. The patient was provided an opportunity to ask questions and all were answered. The patient agreed with the plan and demonstrated an understanding of the instructions.   The patient was advised to call back or seek an in-person evaluation if the symptoms worsen or if the condition fails to improve as anticipated.   I provided 30  minutes of non-face-to-face time during this encounter.   Jerel ONEIDA Pepper, LCSW   12/04/2023

## 2023-12-04 NOTE — Therapy (Signed)
 OUTPATIENT PHYSICAL THERAPY NOTE   Patient Name: Bailey Hooper MRN: [id number] DOB:1965-06-07, 58 y.o., female Today's Date: 12/04/2023  END OF SESSION:  PT End of Session - 12/04/23 1406     Visit Number 6    Number of Visits 17    Date for PT Re-Evaluation 12/18/23    Authorization Type [employer]    Authorization Time Period auth after 27VL    PT Start Time 1402    PT Stop Time 1440    PT Time Calculation (min) 38 min    Activity Tolerance Patient tolerated treatment well    Behavior During Therapy WFL for tasks assessed/performed              Past Medical History:  Diagnosis Date   Allergy    Anxiety    Arthritis    Asthma    Depression    Fibroids    GERD (gastroesophageal reflux disease)    Hiatal hernia    High cholesterol    Hypertension    IBS (irritable bowel syndrome)    Migraines    Non-alcoholic fatty liver disease    Prediabetes    Sleep apnea    cpap   Tobacco use    Vertigo    Past Surgical History:  Procedure Laterality Date   CESAREAN SECTION     COLONOSCOPY     FOOT SURGERY     Left foot   LEFT HEART CATH AND CORONARY ANGIOGRAPHY N/A 08/15/2023   Procedure: LEFT HEART CATH AND CORONARY ANGIOGRAPHY;  Surgeon: Mady Bruckner, MD;  Location: [facility];  Service: Cardiovascular;  Laterality: N/A;   Patient Active Problem List   Diagnosis Date Noted   Family history of dementia 08/16/2021   NAFLD (nonalcoholic fatty liver disease) 95/70/7977   H/O colonoscopy 06/16/2020   Hot flashes 04/27/2019   Bipolar disorder (HCC) 01/18/2019   Asthma 01/18/2019   Hyperlipidemia 07/14/2018   Tobacco abuse 11/29/2017   Benign paroxysmal positional vertigo 11/29/2017   Major depressive disorder, recurrent episode, moderate (HCC) 06/28/2009   GENITAL HERPES 01/17/2009   Migraine headache 01/17/2009   Essential hypertension, benign 01/17/2009   GERD (gastroesophageal reflux disease) 01/17/2009   Irritable bowel syndrome 01/17/2009    Heart murmur 01/17/2009    PCP: Delores Suzann HERO, MD  REFERRING PROVIDER: Louis Shove, MD  REFERRING DIAG: [phone] (ICD-10-CM) - Radiculopathy, cervical region  THERAPY DIAG:  Cervicalgia  Muscle weakness (generalized)  Rationale for Evaluation and Treatment: Rehabilitation  ONSET DATE: ~2021 initially, this episode February/March  SUBJECTIVE:  Per eval: Pt states she has had excruciating pain/numbness in UE, shooting. Pt states first episode occurred in 2021, received work up and resolved after a couple weeks. Occurred again in 2023, self resolved after a couple weeks.  Pt states this most recent episode began in February/march and has essentially resolved as far as UE symptoms go. States she does still have some neck/shoulder pain. Thinks it may be provoked by lifting. States sometimes she will get headaches with neck pain, has history of migraines (since early adulthood).  She notes migraines actually seem to be improving over past couple of years. States pain will sometimes affect sleeping, difficulty positioning for sleep, side sleeping. States she was getting OT for her hand, states this is the same issue and she was told it's coming from her neck. Hand dominance: Right  SUBJECTIVE STATEMENT: 12/04/2023: Patient reporting that her pain is 7/10 pain. She recently requested medicine for her pain from MD.    PERTINENT HISTORY:  chest pain w/ heart cath 08/15/23; asthma, anxiety/depression, HTN, IBS, migraines, GERD Pt states chest pain relieved by sublingual medication  PAIN:  Are you having pain: 7/10 at present  Per eval:  Location/description: neck, more R sided; sometimes has UE symptoms into hand Best-worst over past week: 2-6/10  - aggravating factors: lifting - otherwise  difficult to describe - Easing factors:  heating pad, tylenol   PRECAUTIONS: recent cardiac hx  RED FLAGS: Reports dizziness and chest pain at times, both of which are reportedly resolved by sublingual medication - currently undergoing cardiology work up No difficulty swallowing, no double vision, no bowel/bladder changes. N/T as described above which have occurred on both extremities but typically episodic with one at a time. Chronic headaches that are occasionally coincident with neck pain and numbness, but reportedly improving over last few years. Reports history of vertigo with head movement and sup<>sit    WEIGHT BEARING RESTRICTIONS: No  FALLS:  Has patient fallen in last 6 months? 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  DIAGNOSTIC FINDINGS:  07/19/23 cervical MRI: IMPRESSION: 1. 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 2. Moderate left foraminal stenosis at C5-C6 secondary to uncovertebral joint disease. 3. No high-grade canal stenosis.  PATIENT SURVEYS:  NDI: 29/50 ; 58%  10/23/23: 32/50   COGNITION: Overall cognitive status: Within functional limits for tasks assessed  SENSATION/NEURO: Light touch intact BIL UE, mildly reduced L C4-5 and R C7 Finger<>nose testing unremarkable Negative hoffmann and tromner sign BIL No ataxia with gait  POSTURE: rounded shoulders, mild lean to L   CERVICAL ROM:   ROM A/PROM (deg) eval  Flexion 65 deg   Extension 25 deg (transiently dizzy, she attributes to chronic vertigo)   Right lateral flexion 30 deg discomfort  Left lateral flexion 30 deg discomfort  Right rotation    Left rotation    (Blank rows = not tested) (Key: WFL = within functional limits not formally assessed, * = concordant pain, s = stiffness/stretching sensation, NT = not tested) Comment:   UPPER EXTREMITY ROM:  A/PROM Right eval Left eval  Shoulder flexion    Shoulder abduction    Shoulder internal rotation    Shoulder external rotation  Elbow flexion    Elbow extension    Wrist flexion    Wrist extension     (Blank rows = not tested) (Key: WFL = within functional limits not formally assessed, * = concordant pain, s = stiffness/stretching sensation, NT = not tested)  Comments:    UPPER EXTREMITY MMT:  MMT Right eval Left eval R/L 10/23/23  Shoulder flexion 4 * 5   Shoulder extension     Shoulder abduction 5 5   Shoulder extension     Shoulder internal rotation     Shoulder external rotation     Elbow flexion 4 4   Elbow extension 3+ 4+   Grip strength 30# 40# 20#/25#  (Blank rows = not tested)  (Key: WFL = within functional limits not formally assessed, * = concordant pain, s = stiffness/stretching sensation, NT = not tested)  Comments:   CERVICAL SPECIAL TESTS:  deferred  FUNCTIONAL TESTS:  See grip strength above  TREATMENT DATE:   [facility] Adult PT Treatment:                                                DATE: 12/04/2023  Therapeutic Exercise: UBE level 1, fwd Pball flexion rollout x12 Scapular retraction + double ER 2x10 cues for posture and relaxing UT/grip, green TB Shoulder extension 3 x 10 RTB  Shoulder diagonal abduction x 10 each   Manual Therapy  STM/myofascial trigger point release to R Upper Trapezius, levator R UT pin and stretching                                                                                                                          PATIENT EDUCATION:  Education details: rationale for interventions, HEP  Person educated: Patient Education method: Explanation, Demonstration, Tactile cues, Verbal cues Education  comprehension: verbalized understanding, returned demonstration, verbal cues required, tactile cues required, and needs further education     HOME EXERCISE PROGRAM: Access Code: [email or web address] URL: [email or web address] Date: 09/04/2023 Prepared by: Alm Jenny  Exercises - Seated Scapular Retraction  - 2-3 x daily - 1 sets - 8-10 reps - Shoulder External Rotation and Scapular Retraction  - 2-3 x daily - 1 sets - 8-10 reps  ASSESSMENT:  CLINICAL IMPRESSION: 12/04/2023: Bailey Hooper had good tolerance of today's treatment session, with manual therapy to address increased mm tension with today's activities. We will continue with focus on active interventions to promote independence with symptom management. We will continue to progress per POC as tolerated, in order to reach established rehab goals.    Per eval: Patient is a pleasant 58 y.o. woman who was seen today for physical therapy evaluation and treatment for neck pain/UE pain. Symptoms have fluctuated since ~2021 per pt report - tend to be exacerbated by lifting and neck movement. Does not endorse any cervical red flags, ongoing  cardiac work up (refer to [facility] for details). On exam she demonstrates mild limitations in cervical mobility, good GH mobility, reduced RUE strength, and sensory impairments in UE. Tolerates exam/HEP well overall without increase in resting pain, no adverse events. Recommend trial of skilled PT to address aforementioned deficits with aim of improving functional tolerance and reducing pain with typical activities. Pt departs today's session in no acute distress, all voiced concerns/questions addressed appropriately from PT perspective.     OBJECTIVE IMPAIRMENTS: decreased activity tolerance, decreased endurance, decreased mobility, decreased ROM, decreased strength, impaired perceived functional ability, impaired UE functional use, improper body mechanics, postural dysfunction, and pain.   ACTIVITY LIMITATIONS:  carrying, lifting, and sleeping  PARTICIPATION LIMITATIONS: meal prep, cleaning, laundry, community activity, and occupation  PERSONAL FACTORS: Time since onset of injury/illness/exacerbation and 3+ comorbidities: chest pain w/ heart cath 08/15/23; asthma, anxiety/depression, HTN, IBS, migraines, GERD are also affecting patient's functional outcome.   REHAB POTENTIAL: Fair given chronicity and comorbidities  CLINICAL DECISION MAKING: Evolving/moderate complexity  EVALUATION COMPLEXITY: Moderate   GOALS:   SHORT TERM GOALS: Target date: 10/02/2023  Pt will demonstrate appropriate understanding and performance of initially prescribed HEP in order to facilitate improved independence with management of symptoms.  Baseline: HEP established  10/23/23: reports fair HEP adherence Goal status: MET 12/04/23  2. Pt will report at least 25% improvement in overall pain levels over past week in order to facilitate improved tolerance to typical daily activities.   Baseline: 2-6/10  10/23/23: 3-9/10  Goal status: ONGOING  LONG TERM GOALS: Target date: 12/18/2023  (updated 10/23/23)   Pt will score less than or equal to 19/50 on NDI in order to demonstrate improved perception of function due to symptoms ([facility] 10-13 pts per Neysa dunker al 2009, 2010). Baseline: 29/50 10/23/23: 32/50 Goal status: ONGOING  2. Pt will report at least 50% improvement with work activities in regards to neck/UE pain. Baseline: reports difficulty/pain with work tasks 10/23/23: reports symptoms about the same Goal status: ONGOING  3. Pt will demonstrate grossly symmetrical shoulder/elbow MMT for improved symmetry of UE strength and improved tolerance to functional movements.  Baseline: see MMT chart above Goal status: ONGOING  4. Pt will demonstrate at least 40# of grip strength on RUE in order to facilitate improved functional strengthening. Baseline: see MMT chart above 10/23/23: see MMT chart above Goal status:  ONGOING  5. Pt will report at least 50% decrease in overall pain levels in past week in order to facilitate improved tolerance to basic ADLs/mobility.   Baseline: 2-6/10  10/23/23: 3-9/10  Goal status: ONGOING   PLAN: (updated 10/23/23)  PT FREQUENCY: 1-2x/week  PT DURATION: 8 weeks  PLANNED INTERVENTIONS: 97164- PT Re-evaluation, 97750- Physical Performance Testing, 97110-Therapeutic exercises, 97530- Therapeutic activity, 97112- Neuromuscular re-education, 97535- Self Care, [id number]- Manual therapy, Taping, Dry Needling, Joint mobilization, Spinal mobilization, Cryotherapy, and Moist heat  PLAN FOR NEXT SESSION: Review/update HEP PRN. Work on [employer] exercises as appropriate with emphasis on postural endurance, RUE strengthening, cervical mobility mindful of vertigo/dizziness hx. Symptom modification strategies as indicated/appropriate. Also mindful of recent cardiac hx   Marko Molt, PT, DPT  12/05/2023 7:06 AM

## 2023-12-05 ENCOUNTER — Encounter: Payer: Self-pay | Admitting: Podiatry

## 2023-12-05 ENCOUNTER — Ambulatory Visit (HOSPITAL_BASED_OUTPATIENT_CLINIC_OR_DEPARTMENT_OTHER): Attending: Nurse Practitioner | Admitting: Cardiology

## 2023-12-05 ENCOUNTER — Ambulatory Visit (INDEPENDENT_AMBULATORY_CARE_PROVIDER_SITE_OTHER): Admitting: Podiatry

## 2023-12-05 VITALS — Ht 66.0 in | Wt 183.7 lb

## 2023-12-05 DIAGNOSIS — M2142 Flat foot [pes planus] (acquired), left foot: Secondary | ICD-10-CM

## 2023-12-05 DIAGNOSIS — R42 Dizziness and giddiness: Secondary | ICD-10-CM | POA: Diagnosis not present

## 2023-12-05 DIAGNOSIS — I2510 Atherosclerotic heart disease of native coronary artery without angina pectoris: Secondary | ICD-10-CM | POA: Diagnosis not present

## 2023-12-05 DIAGNOSIS — M2141 Flat foot [pes planus] (acquired), right foot: Secondary | ICD-10-CM | POA: Diagnosis not present

## 2023-12-05 DIAGNOSIS — B351 Tinea unguium: Secondary | ICD-10-CM | POA: Diagnosis not present

## 2023-12-05 DIAGNOSIS — R002 Palpitations: Secondary | ICD-10-CM | POA: Diagnosis not present

## 2023-12-05 DIAGNOSIS — M79675 Pain in left toe(s): Secondary | ICD-10-CM | POA: Diagnosis not present

## 2023-12-05 DIAGNOSIS — G4733 Obstructive sleep apnea (adult) (pediatric): Secondary | ICD-10-CM | POA: Insufficient documentation

## 2023-12-05 DIAGNOSIS — M79674 Pain in right toe(s): Secondary | ICD-10-CM

## 2023-12-05 DIAGNOSIS — I10 Essential (primary) hypertension: Secondary | ICD-10-CM | POA: Diagnosis not present

## 2023-12-05 NOTE — Progress Notes (Signed)
  Subjective:  Patient ID: Bailey Hooper, female    DOB: 1965/07/07,  MRN: [id number]  Chief Complaint  Patient presents with   Callouses    Pt is here due to callous on the bottom of her left foot that is causing some pain while walking.   Ingrown Toenail    Pt has a possible ingrown on left second toenail, states she cut the nail down some but the pain is still there.    58 year old female, with a history of foot surgery, presents with a painful callus on the foot that has been causing discomfort as well as for thick, elongated nails that she is not able to trim herself.  She thinks that she may be getting ingrown toenail was in her left second toe.  She tried cutting herself.   Objective:    Physical Exam         General: AAO x3, NAD  Dermatological: Hyperkeratotic lesion noted along the navicular tuberosity of the left foot, submetatarsal 2 left right hallux.  There is no underlying ulceration, drainage or signs of infection. Nails are hypertrophic, dystrophic, brittle, discolored, elongated 10. No surrounding redness or drainage. Tenderness nails 1-5 bilaterally.  Nails become ingrown without any signs of infection today.  Vascular: Dorsalis Pedis artery and Posterior Tibial artery pedal pulses are 2/4 bilateral with immedate capillary fill time.  There is no pain with calf compression, swelling, warmth, erythema.   Neruologic: Grossly intact via light touch bilateral.   Musculoskeletal: Flatfoot is present.  Prominent metatarsal head plantarly.    No area pinpoint tenderness.  Gait: Unassisted, Nonantalgic.         Assessment:   Pes planovalgus; symptomatic onychomycosis  Plan:  Pes planovalgus -Continue shoes, good arch support.  She also has a callus submetatarsal 2, prominent metatarsal head.  Discussed with conservative as well as surgical options.  She is not interested in surgery at this time.  Discussed shoes, good arch support.  She is not able to get custom  inserts.  She does bring her over-the-counter inserts at next appointment I will try to modify this to help further offload.  Symptomatic onychomycosis -Sharply debrided nails x 10 and complications of bleeding.  She has previously tried topical antifungal medicine.  Urea nail gel.  We discussed nail removal of the left second toe given the thickening of the toenail but she was to hold off on this for now.   Bailey Hooper DPM

## 2023-12-10 DIAGNOSIS — F410 Panic disorder [episodic paroxysmal anxiety] without agoraphobia: Secondary | ICD-10-CM | POA: Diagnosis not present

## 2023-12-10 DIAGNOSIS — F331 Major depressive disorder, recurrent, moderate: Secondary | ICD-10-CM | POA: Diagnosis not present

## 2023-12-10 DIAGNOSIS — F4320 Adjustment disorder, unspecified: Secondary | ICD-10-CM | POA: Diagnosis not present

## 2023-12-12 ENCOUNTER — Ambulatory Visit: Payer: Self-pay | Attending: Neurosurgery

## 2023-12-12 DIAGNOSIS — M542 Cervicalgia: Secondary | ICD-10-CM | POA: Diagnosis not present

## 2023-12-12 DIAGNOSIS — M6281 Muscle weakness (generalized): Secondary | ICD-10-CM | POA: Diagnosis not present

## 2023-12-12 NOTE — Therapy (Signed)
 OUTPATIENT PHYSICAL THERAPY NOTE DISCHARGE SUMMARY   Patient Name: Bailey Hooper MRN: [id number] DOB:09-Aug-1965, 58 y.o., female Today's Date: 12/12/2023  PHYSICAL THERAPY DISCHARGE SUMMARY  Visits from Start of Care: 6    Current functional level related to goals / functional outcomes: See objective findings/assessment    Remaining deficits: See objective findings/assessment    Education / Equipment: See today's treatment/assessment      Patient agrees to discharge. Patient goals were partially met. Patient is being discharged due to maximized rehab potential.     END OF SESSION:   PT End of Session - 12/12/2023 1224      Visit Number 7    Number of Visits 17     Date for PT Re-Evaluation 12/18/23     Authorization Type [facility]     Authorization Time Period auth after 27VL     PT Start Time 1220    PT Stop Time 1245    PT Time Calculation (min) 25 min     Activity Tolerance Patient tolerated treatment well     Behavior During Therapy WFL for tasks assessed/performed        Past Medical History:  Diagnosis Date   Allergy    Anxiety    Arthritis    Asthma    Depression    Fibroids    GERD (gastroesophageal reflux disease)    Hiatal hernia    High cholesterol    Hypertension    IBS (irritable bowel syndrome)    Migraines    Non-alcoholic fatty liver disease    Prediabetes    Sleep apnea    cpap   Tobacco use    Vertigo    Past Surgical History:  Procedure Laterality Date   CESAREAN SECTION     COLONOSCOPY     FOOT SURGERY     Left foot   LEFT HEART CATH AND CORONARY ANGIOGRAPHY N/A 08/15/2023   Procedure: LEFT HEART CATH AND CORONARY ANGIOGRAPHY;  Surgeon: Mady Bruckner, MD;  Location: [facility];  Service: Cardiovascular;  Laterality: N/A;   Patient Active Problem List   Diagnosis Date Noted   Family history of dementia 08/16/2021   NAFLD (nonalcoholic fatty liver disease) 95/70/7977   H/O colonoscopy 06/16/2020   Hot  flashes 04/27/2019   Bipolar disorder (HCC) 01/18/2019   Asthma 01/18/2019   Hyperlipidemia 07/14/2018   Tobacco abuse 11/29/2017   Benign paroxysmal positional vertigo 11/29/2017   Major depressive disorder, recurrent episode, moderate (HCC) 06/28/2009   GENITAL HERPES 01/17/2009   Migraine headache 01/17/2009   Essential hypertension, benign 01/17/2009   GERD (gastroesophageal reflux disease) 01/17/2009   Irritable bowel syndrome 01/17/2009   Heart murmur 01/17/2009    PCP: Delores Suzann HERO, MD  REFERRING PROVIDER: Louis Shove, MD  REFERRING DIAG: [phone] (ICD-10-CM) - Radiculopathy, cervical region  THERAPY DIAG:  Cervicalgia  Muscle weakness (generalized)  Rationale for Evaluation and Treatment: Rehabilitation  ONSET DATE: ~2021 initially, this episode February/March  SUBJECTIVE:  Per eval: Pt states she has had excruciating pain/numbness in UE, shooting. Pt states first episode occurred in 2021, received work up and resolved after a couple weeks. Occurred again in 2023, self resolved after a couple weeks.  Pt states this most recent episode began in February/march and has essentially resolved as far as UE symptoms go. States she does still have some neck/shoulder pain. Thinks it may be provoked by lifting. States sometimes she will get headaches with neck pain, has history of migraines (since early adulthood).  She notes migraines actually seem to be improving over past couple of years. States pain will sometimes affect sleeping, difficulty positioning for sleep, side sleeping. States she was getting OT for her hand, states this is the same issue and she was told it's coming from her neck. Hand dominance: Right  SUBJECTIVE STATEMENT: 12/12/2023: Patient reporting that her pain is 7/10  pain. She recently requested medicine for her pain from MD. She feels that her symptoms have had little improvement, and continues to have significant pain, especially with activity.    PERTINENT HISTORY:  chest pain w/ heart cath 08/15/23; asthma, anxiety/depression, HTN, IBS, migraines, GERD Pt states chest pain relieved by sublingual medication  PAIN:  Are you having pain: 7/10 at present  Per eval:  Location/description: neck, more R sided; sometimes has UE symptoms into hand Best-worst over past week: 2-6/10  - aggravating factors: lifting - otherwise difficult to describe - Easing factors:  heating pad, tylenol   PRECAUTIONS: recent cardiac hx  RED FLAGS: Reports dizziness and chest pain at times, both of which are reportedly resolved by sublingual medication - currently undergoing cardiology work up No difficulty swallowing, no double vision, no bowel/bladder changes. N/T as described above which have occurred on both extremities but typically episodic with one at a time. Chronic headaches that are occasionally coincident with neck pain and numbness, but reportedly improving over last few years. Reports history of vertigo with head movement and sup<>sit    WEIGHT BEARING RESTRICTIONS: No  FALLS:  Has patient fallen in last 6 months? 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  DIAGNOSTIC FINDINGS:  07/19/23 cervical MRI: IMPRESSION: 1. 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 2. Moderate left foraminal stenosis at C5-C6 secondary to uncovertebral joint disease. 3. No high-grade canal stenosis.  PATIENT SURVEYS:  NDI: 29/50 ;  58%  10/23/23: 32/50   COGNITION: Overall cognitive status: Within functional limits for tasks assessed  SENSATION/NEURO: Light touch intact BIL UE, mildly reduced L C4-5 and R C7 Finger<>nose testing unremarkable Negative hoffmann and tromner sign BIL No ataxia with gait  POSTURE: rounded shoulders, mild lean to L   CERVICAL ROM:   ROM A/PROM (deg) eval AROM 12/12/23  Flexion 65 deg  40 deg  Extension 25 deg (transiently dizzy, she attributes to chronic vertigo)  40 deg  Right lateral flexion 30 deg discomfort 30 deg   Left lateral flexion 30 deg discomfort 30 deg  Right rotation  48  Left rotation  55 deg   (Blank rows = not tested) (Key: WFL = within functional limits not formally assessed, * = concordant pain, s = stiffness/stretching sensation, NT = not tested) Comment:  UPPER EXTREMITY ROM:  A/PROM Right eval Left eval  Shoulder flexion    Shoulder abduction    Shoulder internal rotation    Shoulder external rotation    Elbow flexion    Elbow extension    Wrist flexion    Wrist extension     (Blank rows = not tested) (Key: WFL = within functional limits not formally assessed, * = concordant pain, s = stiffness/stretching sensation, NT = not tested)  Comments:    UPPER EXTREMITY MMT:  MMT Right eval Left eval R/L 10/23/23 R/L 12/12/23  Shoulder flexion 4 * 5  5/5  Shoulder extension      Shoulder abduction 5 5  5/5  Shoulder extension      Shoulder internal rotation      Shoulder external rotation      Elbow flexion 4 4    Elbow extension 3+ 4+    Grip strength 30# 40# 20#/25# 20#/25#  (Blank rows = not tested)  (Key: WFL = within functional limits not formally assessed, * = concordant pain, s = stiffness/stretching sensation, NT = not tested)  Comments:   CERVICAL SPECIAL TESTS:  deferred  FUNCTIONAL TESTS:  See grip strength above  TREATMENT DATE:   [facility] Adult PT Treatment:                                                DATE:  12/12/2023   Therapeutic Activity:  Reassessment of objective measures and subjective assessment regarding progress towards established goals and plan for independence with prescribed home program following discharged from PT                                                                                                                          PATIENT EDUCATION:  Education details: rationale for interventions, HEP  Person educated: Patient Education method: Explanation, Demonstration, Tactile cues, Verbal cues Education comprehension: verbalized understanding, returned demonstration, verbal cues required, tactile cues required, and needs further education     HOME EXERCISE PROGRAM: Access Code: [email or web address] URL: [email or web address] Date: 12/12/2023 Prepared by: Marko Molt  Exercises - Seated Scapular Retraction  - 2 x daily - 2 sets - 10 reps - Seated Gentle Upper Trapezius Stretch  - 2 x daily - 3 reps - 30 sec hold - Shoulder External Rotation and Scapular Retraction with Resistance  - 2 x daily - 2 sets - 10 reps - Shoulder extension with resistance - Neutral  - 2 x daily - 2 sets - 10 reps - Standing Shoulder Diagonal Horizontal Abduction 60/120 Degrees with Resistance  - 2 x daily - 2 sets - 10 reps  ASSESSMENT:  CLINICAL IMPRESSION: 12/12/2023: Briceida has attended 6 visits since initial evaluation. Patient has made good progress with CS AROM without report of dizziness, and have met only 3/7 established rehab goals.  She should continue with prescribed home program. Patient will be discharged from skilled PT at this time and should follow up with referring provider as needed.     Per eval: Patient is a pleasant 57 y.o. woman who was seen today for physical therapy evaluation and treatment for neck pain/UE pain. Symptoms have fluctuated since ~2021 per pt report - tend to be exacerbated by lifting and neck movement. Does not endorse any cervical red flags, ongoing  cardiac work up (refer to [facility] for details). On exam she demonstrates mild limitations in cervical mobility, good GH mobility, reduced RUE strength, and sensory impairments in UE. Tolerates exam/HEP well overall without increase in resting pain, no adverse events. Recommend trial of skilled PT to address aforementioned deficits with aim of improving functional tolerance and reducing pain with typical activities. Pt departs today's session in no acute distress, all voiced concerns/questions addressed appropriately from PT perspective.     OBJECTIVE IMPAIRMENTS: decreased activity tolerance, decreased endurance, decreased mobility, decreased ROM, decreased strength, impaired perceived functional ability, impaired UE functional use, improper body mechanics, postural dysfunction, and pain.   ACTIVITY LIMITATIONS: carrying, lifting, and sleeping  PARTICIPATION LIMITATIONS: meal prep, cleaning, laundry, community activity, and occupation  PERSONAL FACTORS: Time since onset of injury/illness/exacerbation and 3+ comorbidities: chest pain w/ heart cath 08/15/23; asthma, anxiety/depression, HTN, IBS, migraines, GERD are also affecting patient's functional outcome.   REHAB POTENTIAL: Fair given chronicity and comorbidities  CLINICAL DECISION MAKING: Evolving/moderate complexity  EVALUATION COMPLEXITY: Moderate   GOALS:   SHORT TERM GOALS: Target date: 10/02/2023  Pt will demonstrate appropriate understanding and performance of initially prescribed HEP in order to facilitate improved independence with management of symptoms.  Baseline: HEP established  10/23/23: reports fair HEP adherence Goal status: MET 12/04/23  2. Pt will report at least 25% improvement in overall pain levels over past week in order to facilitate improved tolerance to typical daily activities.   Baseline: 2-6/10  10/23/23: 3-9/10  Goal status: NOT MET  LONG TERM GOALS: Target date: 12/18/2023  (updated 10/23/23)   Pt will score  less than or equal to 19/50 on NDI in order to demonstrate improved perception of function due to symptoms ([facility] 10-13 pts per Neysa dunker al 2009, 2010). Baseline: 29/50 10/23/23: 32/50 12/12/23: 29/50 Goal status: MET  2. Pt will report at least 50% improvement with work activities in regards to neck/UE pain. Baseline: reports difficulty/pain with work tasks 10/23/23: reports symptoms about the same Goal status: NOT MET  3. Pt will demonstrate grossly symmetrical shoulder/elbow MMT for improved symmetry of UE strength and improved tolerance to functional movements.  Baseline: see MMT chart above Goal status:  MET  4. Pt will demonstrate at least 40# of grip strength on RUE in order to facilitate improved functional strengthening. Baseline: see MMT chart above 10/23/23: see MMT chart above Goal status: NOT MET  5. Pt will report at least 50% decrease in overall pain levels in past week in order to facilitate improved tolerance to basic ADLs/mobility.   Baseline: 2-6/10  10/23/23: 3-9/10  Goal status: NOT MET   PLAN: (updated 10/23/23)  PT FREQUENCY: 1-2x/week  PT DURATION: 8 weeks  PLANNED INTERVENTIONS: 02835- PT Re-evaluation, 97750- Physical Performance Testing, 97110-Therapeutic exercises, 97530- Therapeutic activity, 97112- Neuromuscular re-education, 97535- Self Care, 02859- Manual therapy, Taping, Dry Needling, Joint mobilization, Spinal mobilization, Cryotherapy, and Moist heat    Marko Molt, PT, DPT  12/15/2023 8:56 PM

## 2023-12-14 ENCOUNTER — Telehealth: Admitting: Nurse Practitioner

## 2023-12-14 ENCOUNTER — Encounter: Payer: Self-pay | Admitting: Nurse Practitioner

## 2023-12-14 DIAGNOSIS — J069 Acute upper respiratory infection, unspecified: Secondary | ICD-10-CM

## 2023-12-14 MED ORDER — AZITHROMYCIN 250 MG PO TABS: ORAL_TABLET | ORAL | 0 refills | Status: DC

## 2023-12-14 NOTE — Progress Notes (Signed)
 Virtual Visit Consent   Bailey Hooper, you are scheduled for a virtual visit with a [facility] provider today. Just as with appointments in the office, your consent must be obtained to participate. Your consent will be active for this visit and any virtual visit you may have with one of our providers in the next 365 days. If you have a MyChart account, a copy of this consent can be sent to you electronically.  As this is a virtual visit, video technology does not allow for your provider to perform a traditional examination. This may limit your provider's ability to fully assess your condition. If your provider identifies any concerns that need to be evaluated in person or the need to arrange testing (such as labs, EKG, etc.), we will make arrangements to do so. Although advances in technology are sophisticated, we cannot ensure that it will always work on either your end or our end. If the connection with a video visit is poor, the visit may have to be switched to a telephone visit. With either a video or telephone visit, we are not always able to ensure that we have a secure connection.  By engaging in this virtual visit, you consent to the provision of healthcare and authorize for your insurance to be billed (if applicable) for the services provided during this visit. Depending on your insurance coverage, you may receive a charge related to this service.  I need to obtain your verbal consent now. Are you willing to proceed with your visit today? Bailey Hooper has provided verbal consent on 12/14/2023 for a virtual visit (video or telephone). Haze LELON Servant, NP  Date: 12/14/2023 3:09 PM   Virtual Visit via Video Note   I, Haze LELON Servant, connected with  Bailey Hooper  ([id number], 06/08/1965) on 12/14/23 at  3:00 PM EDT by a video-enabled telemedicine application and verified that I am speaking with the correct person using two identifiers.  Location: Patient: Virtual Visit Location Patient:  Home Provider: Virtual Visit Location Provider: Home Office   I discussed the limitations of evaluation and management by telemedicine and the availability of in person appointments. The patient expressed understanding and agreed to proceed.    History of Present Illness: Bailey Hooper is a 58 y.o. who identifies as a female who was assigned female at birth, and is being seen today for uri with cough and congestion.  For the past 4 days Bailey Hooper has been experiencing scratchy throat, runny watery eyes, coughing, sneezing, nasal congestion and mild fever. Taking tylenol  and coricidin with no relief. COVID test 2 days ago was negative.  Coughing yellow brownish sputum in the mornings. Streaks of blood when blowing nose. She is a smoker. History of Asthma and using inhaler and nebs.    Problems:  Patient Active Problem List   Diagnosis Date Noted   Family history of dementia 08/16/2021   NAFLD (nonalcoholic fatty liver disease) 95/70/7977   H/O colonoscopy 06/16/2020   Hot flashes 04/27/2019   Bipolar disorder (HCC) 01/18/2019   Asthma 01/18/2019   Hyperlipidemia 07/14/2018   Tobacco abuse 11/29/2017   Benign paroxysmal positional vertigo 11/29/2017   Major depressive disorder, recurrent episode, moderate (HCC) 06/28/2009   GENITAL HERPES 01/17/2009   Migraine headache 01/17/2009   Essential hypertension, benign 01/17/2009   GERD (gastroesophageal reflux disease) 01/17/2009   Irritable bowel syndrome 01/17/2009   Heart murmur 01/17/2009    Allergies:  Allergies  Allergen Reactions   Lisinopril  Palpitations and  Cough    Per pt report   Lamictal  [Lamotrigine ] Hives    Denies airway involvement    Penicillins Rash    Vesicles, denies SOB or breathing issues   Erythromycin Nausea And Vomiting   Medications:  Current Outpatient Medications:    azithromycin  (ZITHROMAX ) 250 MG tablet, Take 2 tablets on day 1, then 1 tablet daily on days 2 through 5, Disp: 6 tablet, Rfl: 0   albuterol   (PROVENTIL ) (2.5 MG/3ML) 0.083% nebulizer solution, Take 3 mLs (2.5 mg total) by nebulization every 6 (six) hours as needed for wheezing or shortness of breath., Disp: 150 mL, Rfl: 1   albuterol  (VENTOLIN  HFA) 108 (90 Base) MCG/ACT inhaler, Inhale 2 puffs into the lungs every 6 (six) hours as needed for wheezing., Disp: 18 g, Rfl: 2   aspirin  EC 81 MG tablet, Take 1 tablet (81 mg total) by mouth daily. Swallow whole., Disp: , Rfl:    atorvastatin  (LIPITOR) 80 MG tablet, Take 1 tablet (80 mg total) by mouth daily., Disp: 90 tablet, Rfl: 3   azelastine  (OPTIVAR ) 0.05 % ophthalmic solution, Place 1 drop into both eyes 2 (two) times daily. (Patient taking differently: Place 1 drop into both eyes daily as needed (allergies).), Disp: 6 mL, Rfl: 12   ciclopirox  (PENLAC ) 8 % solution, Apply topically at bedtime. Apply over nail and surrounding skin. Apply daily over previous coat. After seven (7) days, may remove with alcohol and continue cycle., Disp: 6.6 mL, Rfl: 0   clonazePAM  (KLONOPIN ) 0.5 MG tablet, Take 1 tablet (0.5 mg total) by mouth daily as needed., Disp: 30 tablet, Rfl: 0   cyclobenzaprine  (FLEXERIL ) 10 MG tablet, Take 1 tablet (10 mg total) by mouth 3 (three) times daily as needed for muscle spasms., Disp: 30 tablet, Rfl: 0   ELIDEL  1 % cream, Apply topically daily. Use on affected areas. (Patient taking differently: Apply 1 Application topically daily as needed (irritation). Use on affected areas.), Disp: 60 g, Rfl: 0   escitalopram (LEXAPRO) 10 MG tablet, Take 10 mg by mouth daily., Disp: , Rfl:    estradiol  (VIVELLE -DOT) 0.05 MG/24HR patch, Place 1 patch onto the skin 2 (two) times a week., Disp: , Rfl:    famotidine  (PEPCID ) 20 MG tablet, Take 20 mg by mouth 2 (two) times daily., Disp: , Rfl:    Fluocinolone  Acetonide Scalp (DERMA-SMOOTHE /FS SCALP) 0.01 % OIL, Apply 1 Application topically as directed., Disp: 118.28 mL, Rfl: 3   gabapentin  (NEURONTIN ) 100 MG capsule, Take 100 mg by mouth 3  (three) times daily as needed (pain)., Disp: , Rfl:    hydrOXYzine  (ATARAX ) 10 MG tablet, Take 10 mg by mouth 3 (three) times daily as needed for itching., Disp: , Rfl:    isosorbide  mononitrate (IMDUR ) 30 MG 24 hr tablet, Take 1 tablet (30 mg total) by mouth daily., Disp: 30 tablet, Rfl: 11   lansoprazole  (PREVACID ) 30 MG capsule, Take 1 capsule (30 mg total) by mouth daily at 12 noon., Disp: 90 capsule, Rfl: 3   loratadine  (CLARITIN ) 10 MG tablet, Take 1 tablet (10 mg total) by mouth daily. (Patient taking differently: Take 10 mg by mouth daily as needed for allergies.), Disp: 30 tablet, Rfl: 11   Maca Root 500 MG CAPS, Take 500 mg by mouth daily., Disp: , Rfl:    methocarbamol (ROBAXIN) 500 MG tablet, Take 500 mg by mouth every 6 (six) hours as needed for muscle spasms., Disp: , Rfl:    metoprolol  succinate (TOPROL -XL) 50 MG 24 hr  tablet, Take 1 tablet (50 mg total) by mouth at bedtime. Take with or immediately following a meal., Disp: 90 tablet, Rfl: 3   mirtazapine  (REMERON ) 45 MG tablet, Take 1 tablet (45 mg total) by mouth at bedtime. (Patient taking differently: Take 22.5 mg by mouth at bedtime.), Disp: 30 tablet, Rfl: 1   mometasone  (NASONEX ) 50 MCG/ACT nasal spray, 2 sprays each nostril daily (Patient taking differently: Place 2 sprays into the nose daily as needed (allergies).), Disp: 17 g, Rfl: 12   Multiple Vitamin (MULTIVITAMIN) capsule, Take 1 capsule by mouth daily., Disp: , Rfl:    nitroGLYCERIN  (NITROSTAT ) 0.4 MG SL tablet, Place 1 tablet (0.4 mg total) under the tongue every 5 (five) minutes as needed for chest pain., Disp: 25 tablet, Rfl: 1   nystatin  (MYCOSTATIN /NYSTOP ) powder, Apply 1 Application topically 2 (two) times daily. In skin fold (Patient taking differently: Apply 1 Application topically 2 (two) times daily as needed (irritation in skin fold).), Disp: 15 g, Rfl: 2   Olmesartan -amLODIPine -HCTZ 40-10-25 MG TABS, Take 1 tablet by mouth daily., Disp: 30 tablet, Rfl: 5    ondansetron  (ZOFRAN ) 4 MG tablet, Take 4 mg by mouth every 8 (eight) hours as needed for vomiting or nausea., Disp: , Rfl:    ondansetron  (ZOFRAN -ODT) 4 MG disintegrating tablet, DISSOLVE 1 TABLET UNDER THE TONGUE EVERY 6 HOURS AS NEEDED FOR NAUSEA AND VOMITING., Disp: 30 tablet, Rfl: 0   QUEtiapine  (SEROQUEL ) 50 MG tablet, Take 50 mg by mouth at bedtime., Disp: , Rfl:    SUMAtriptan  (IMITREX ) 50 MG tablet, May repeat in 2 hours if headache persists or recurs with max of 100 mg in 24 hours., Disp: 9 tablet, Rfl: 11   SYMBICORT  160-4.5 MCG/ACT inhaler, Inhale 2 puffs into the lungs 2 (two) times daily., Disp: 10.2 g, Rfl: 3   traZODone  (DESYREL ) 50 MG tablet, Take 0.5-1 tablets (25-50 mg total) by mouth at bedtime as needed for sleep., Disp: 30 tablet, Rfl: 1   triamcinolone  (KENALOG ) 0.025 % ointment, Apply 1 Application topically 2 (two) times daily., Disp: 30 g, Rfl: 0   varenicline  (CHANTIX ) 0.5 MG tablet, Take 1 tablet (0.5 mg total) by mouth 2 (two) times daily. Take one tablet once daily with food for 7 days THEN increase to one tablet twice daily with food, Disp: 60 tablet, Rfl: 5  Observations/Objective: Patient is well-developed, well-nourished in no acute distress.  Resting comfortably at home.  Head is normocephalic, atraumatic.  No labored breathing.  Speech is clear and coherent with logical content.  Patient is alert and oriented at baseline.    Assessment and Plan: 1. URI with cough and congestion (Primary) - azithromycin  (ZITHROMAX ) 250 MG tablet; Take 2 tablets on day 1, then 1 tablet daily on days 2 through 5  Dispense: 6 tablet; Refill: 0  INSTRUCTIONS: use a humidifier for nasal congestion Drink plenty of fluids, rest and wash hands frequently to avoid the spread of infection Alternate tylenol  and Motrin  for relief of fever   Follow Up Instructions: I discussed the assessment and treatment plan with the patient. The patient was provided an opportunity to ask questions  and all were answered. The patient agreed with the plan and demonstrated an understanding of the instructions.  A copy of instructions were sent to the patient via MyChart unless otherwise noted below.    The patient was advised to call back or seek an in-person evaluation if the symptoms worsen or if the condition fails to improve as anticipated.  Calandra Madura W Justa Hatchell, NP

## 2023-12-14 NOTE — Patient Instructions (Signed)
 Reena NOVAK Andaya, thank you for joining Haze LELON Servant, NP for today's virtual visit.  While this provider is not your primary care provider (PCP), if your PCP is located in our provider database this encounter information will be shared with them immediately following your visit.   A [facility] MyChart account gives you access to today's visit and all your visits, tests, and labs performed at [facility]  click here if you don't have a [facility] MyChart account or go to [email or web address]  Consent: (Patient) Michael Ventresca Sannes provided verbal consent for this virtual visit at the beginning of the encounter.  Current Medications:  Current Outpatient Medications:    azithromycin  (ZITHROMAX ) 250 MG tablet, Take 2 tablets on day 1, then 1 tablet daily on days 2 through 5, Disp: 6 tablet, Rfl: 0   albuterol  (PROVENTIL ) (2.5 MG/3ML) 0.083% nebulizer solution, Take 3 mLs (2.5 mg total) by nebulization every 6 (six) hours as needed for wheezing or shortness of breath., Disp: 150 mL, Rfl: 1   albuterol  (VENTOLIN  HFA) 108 (90 Base) MCG/ACT inhaler, Inhale 2 puffs into the lungs every 6 (six) hours as needed for wheezing., Disp: 18 g, Rfl: 2   aspirin  EC 81 MG tablet, Take 1 tablet (81 mg total) by mouth daily. Swallow whole., Disp: , Rfl:    atorvastatin  (LIPITOR) 80 MG tablet, Take 1 tablet (80 mg total) by mouth daily., Disp: 90 tablet, Rfl: 3   azelastine  (OPTIVAR ) 0.05 % ophthalmic solution, Place 1 drop into both eyes 2 (two) times daily. (Patient taking differently: Place 1 drop into both eyes daily as needed (allergies).), Disp: 6 mL, Rfl: 12   ciclopirox  (PENLAC ) 8 % solution, Apply topically at bedtime. Apply over nail and surrounding skin. Apply daily over previous coat. After seven (7) days, may remove with alcohol and continue cycle., Disp: 6.6 mL, Rfl: 0   clonazePAM  (KLONOPIN ) 0.5 MG tablet, Take 1 tablet (0.5 mg total) by mouth daily as needed., Disp: 30 tablet, Rfl: 0    cyclobenzaprine  (FLEXERIL ) 10 MG tablet, Take 1 tablet (10 mg total) by mouth 3 (three) times daily as needed for muscle spasms., Disp: 30 tablet, Rfl: 0   ELIDEL  1 % cream, Apply topically daily. Use on affected areas. (Patient taking differently: Apply 1 Application topically daily as needed (irritation). Use on affected areas.), Disp: 60 g, Rfl: 0   escitalopram (LEXAPRO) 10 MG tablet, Take 10 mg by mouth daily., Disp: , Rfl:    estradiol  (VIVELLE -DOT) 0.05 MG/24HR patch, Place 1 patch onto the skin 2 (two) times a week., Disp: , Rfl:    famotidine  (PEPCID ) 20 MG tablet, Take 20 mg by mouth 2 (two) times daily., Disp: , Rfl:    Fluocinolone  Acetonide Scalp (DERMA-SMOOTHE /FS SCALP) 0.01 % OIL, Apply 1 Application topically as directed., Disp: 118.28 mL, Rfl: 3   gabapentin  (NEURONTIN ) 100 MG capsule, Take 100 mg by mouth 3 (three) times daily as needed (pain)., Disp: , Rfl:    hydrOXYzine  (ATARAX ) 10 MG tablet, Take 10 mg by mouth 3 (three) times daily as needed for itching., Disp: , Rfl:    isosorbide  mononitrate (IMDUR ) 30 MG 24 hr tablet, Take 1 tablet (30 mg total) by mouth daily., Disp: 30 tablet, Rfl: 11   lansoprazole  (PREVACID ) 30 MG capsule, Take 1 capsule (30 mg total) by mouth daily at 12 noon., Disp: 90 capsule, Rfl: 3   loratadine  (CLARITIN ) 10 MG tablet, Take 1 tablet (10 mg total) by mouth daily. (Patient taking  differently: Take 10 mg by mouth daily as needed for allergies.), Disp: 30 tablet, Rfl: 11   Maca Root 500 MG CAPS, Take 500 mg by mouth daily., Disp: , Rfl:    methocarbamol (ROBAXIN) 500 MG tablet, Take 500 mg by mouth every 6 (six) hours as needed for muscle spasms., Disp: , Rfl:    metoprolol  succinate (TOPROL -XL) 50 MG 24 hr tablet, Take 1 tablet (50 mg total) by mouth at bedtime. Take with or immediately following a meal., Disp: 90 tablet, Rfl: 3   mirtazapine  (REMERON ) 45 MG tablet, Take 1 tablet (45 mg total) by mouth at bedtime. (Patient taking differently: Take 22.5  mg by mouth at bedtime.), Disp: 30 tablet, Rfl: 1   mometasone  (NASONEX ) 50 MCG/ACT nasal spray, 2 sprays each nostril daily (Patient taking differently: Place 2 sprays into the nose daily as needed (allergies).), Disp: 17 g, Rfl: 12   Multiple Vitamin (MULTIVITAMIN) capsule, Take 1 capsule by mouth daily., Disp: , Rfl:    nitroGLYCERIN  (NITROSTAT ) 0.4 MG SL tablet, Place 1 tablet (0.4 mg total) under the tongue every 5 (five) minutes as needed for chest pain., Disp: 25 tablet, Rfl: 1   nystatin  (MYCOSTATIN /NYSTOP ) powder, Apply 1 Application topically 2 (two) times daily. In skin fold (Patient taking differently: Apply 1 Application topically 2 (two) times daily as needed (irritation in skin fold).), Disp: 15 g, Rfl: 2   Olmesartan -amLODIPine -HCTZ 40-10-25 MG TABS, Take 1 tablet by mouth daily., Disp: 30 tablet, Rfl: 5   ondansetron  (ZOFRAN ) 4 MG tablet, Take 4 mg by mouth every 8 (eight) hours as needed for vomiting or nausea., Disp: , Rfl:    ondansetron  (ZOFRAN -ODT) 4 MG disintegrating tablet, DISSOLVE 1 TABLET UNDER THE TONGUE EVERY 6 HOURS AS NEEDED FOR NAUSEA AND VOMITING., Disp: 30 tablet, Rfl: 0   QUEtiapine  (SEROQUEL ) 50 MG tablet, Take 50 mg by mouth at bedtime., Disp: , Rfl:    SUMAtriptan  (IMITREX ) 50 MG tablet, May repeat in 2 hours if headache persists or recurs with max of 100 mg in 24 hours., Disp: 9 tablet, Rfl: 11   SYMBICORT  160-4.5 MCG/ACT inhaler, Inhale 2 puffs into the lungs 2 (two) times daily., Disp: 10.2 g, Rfl: 3   traZODone  (DESYREL ) 50 MG tablet, Take 0.5-1 tablets (25-50 mg total) by mouth at bedtime as needed for sleep., Disp: 30 tablet, Rfl: 1   triamcinolone  (KENALOG ) 0.025 % ointment, Apply 1 Application topically 2 (two) times daily., Disp: 30 g, Rfl: 0   varenicline  (CHANTIX ) 0.5 MG tablet, Take 1 tablet (0.5 mg total) by mouth 2 (two) times daily. Take one tablet once daily with food for 7 days THEN increase to one tablet twice daily with food, Disp: 60 tablet, Rfl:  5   Medications ordered in this encounter:  Meds ordered this encounter  Medications   azithromycin  (ZITHROMAX ) 250 MG tablet    Sig: Take 2 tablets on day 1, then 1 tablet daily on days 2 through 5    Dispense:  6 tablet    Refill:  0    Supervising Provider:   LAMPTEY, PHILIP O [phone]     *If you need refills on other medications prior to your next appointment, please contact your pharmacy*  Follow-Up: Call back or seek an in-person evaluation if the symptoms worsen or if the condition fails to improve as anticipated.  [facility] [phone]  Other Instructions INSTRUCTIONS: use a humidifier for nasal congestion Drink plenty of fluids, rest and wash hands  frequently to avoid the spread of infection Alternate tylenol  and Motrin  for relief of fever    If you have been instructed to have an in-person evaluation today at a local Urgent Care facility, please use the link below. It will take you to a list of all of our available [facility], including address, phone number and hours of operation. Please do not delay care.  [facility]  If you or a family member do not have a primary care provider, use the link below to schedule a visit and establish care. When you choose a [facility] primary care physician or advanced practice provider, you gain a long-term partner in health. Find a Primary Care Provider  Learn more about [facility] in-office and virtual care options: [facility] - Get Care Now

## 2023-12-15 ENCOUNTER — Other Ambulatory Visit: Payer: Self-pay | Admitting: Nurse Practitioner

## 2023-12-15 DIAGNOSIS — J069 Acute upper respiratory infection, unspecified: Secondary | ICD-10-CM

## 2023-12-15 MED ORDER — AZITHROMYCIN 250 MG PO TABS: ORAL_TABLET | ORAL | 0 refills | Status: AC

## 2023-12-15 NOTE — Procedures (Signed)
 Indications for Polysomnography The patient is a 58 year old Female who is 5' 6 and weighs 184.0 lbs. Her BMI equals 29.9.  A full night polysomnogram was performed to evaluate for obstructive sleep apnea.  Medications Taken at [facility] Polysomnogram Data A full night polysomnogram recorded the standard physiologic parameters including EEG, EOG, EMG, EKG, nasal and oral airflow.  Respiratory parameters of chest and abdominal movements were recorded with Respiratory Inductance Plethysmography belts.   Oxygen saturation was recorded by pulse oximetry.  Sleep Architecture The total recording time of the polysomnogram was 465.1 minutes.  The total sleep time was 341.5 minutes.  The patient spent 2.2% of total sleep time in Stage N1, 56.5% in Stage N2, 23.9% in Stages N3, and 17.4% in REM.  Sleep latency was 112.5 minutes.   REM latency was 80.5 minutes.  Sleep Efficiency was 73.4%.  Wake after Sleep Onset time was 11.0 minutes.  Respiratory Events The polysomnogram revealed a presence of 8 obstructive, 5 centrals, and 0 mixed apneas resulting in an Apnea index of 2.3 events per hour.  There were 93 hypopneas (GreaterEqual to3% desaturation and/or arousal) resulting in an Apnea\Hypopnea Index (AHI  GreaterEqual to3% desaturation and/or arousal) of 18.6 events per hour.  There were 52 hypopneas (GreaterEqual to4% desaturation) resulting in an Apnea\Hypopnea Index (AHI GreaterEqual to4% desaturation) of 11.4 events per hour.  There were 51  Respiratory Effort Related Arousals resulting in a RERA index of 9.0 events per hour. The Respiratory Disturbance Index is 27.6 events per hour.  The snore index was 250.4 events per hour.  Mean oxygen saturation was 94.6%.  The lowest oxygen saturation during sleep was 76.0%.  Time spent LessEqual to88% oxygen saturation was  minutes ().  Limb Activity There were 49 total limb movements recorded, of this total, 46 were classified  as PLMs.  PLM index was 8.1 per hour and PLM associated with Arousals index was 0 per hour.  Cardiac Summary The average pulse rate was 57.2 bpm.  The minimum pulse rate was 47.0 bpm while the maximum pulse rate was 87.0 bpm.  Cardiac rhythm was normal.  Diagnosis: Mild Obstructive Sleep Apnea         Recommendations: 1.  Clinical correlation of these findings is necessary.  The decision to treat obstructive sleep apnea (OSA) is usually based on the presence of apnea symptoms or the presence of associated medical conditions such as Hypertension, Congestive Heart  Failure, Atrial Fibrillation or Obesity.  The most common symptoms of OSA are snoring, gasping for breath while sleeping, daytime sleepiness and fatigue.  2.  Initiating apnea therapy is recommended given the presence of symptoms and/or associated conditions. Recommend proceeding with one of the following:  a.  Auto-CPAP therapy with a pressure range of 5-20cm H2O.  b.  An oral appliance (OA) that can be obtained from certain dentists with expertise in sleep medicine.  These are primarily of use in non-obese patients with mild and moderate disease.  c.  An ENT consultation which may be useful to look for specific causes of obstruction and possible treatment options.  d.  If patient is intolerant to PAP therapy, consider referral to ENT for evaluation for hypoglossal nerve stimulator.  3.  Close follow-up is necessary to ensure success with CPAP or oral appliance therapy for maximum benefit.  4.  A follow-up oximetry study on CPAP is recommended to assess the adequacy of therapy and determine the need for supplemental oxygen or the potential need for Bi-level therapy.  An  arterial blood gas to determine the adequacy of baseline ventilation  and oxygenation should also be considered.  5.  Healthy sleep recommendations include:  adequate nightly sleep (normal 7-9 hrs/night), avoidance of caffeine after noon and alcohol near  bedtime, and maintaining a sleep environment that is cool, dark and quiet.  6.  Weight loss for overweight patients is recommended.  Even modest amounts of weight loss can significantly improve the severity of sleep apnea.  7.  Snoring recommendations include:  weight loss where appropriate, side sleeping, and avoidance of alcohol before bed.  8.  Operation of motor vehicle should be avoided when sleepy.    This study was personally reviewed and electronically signed by: SHLOMO WILBERT SAUNDERS., MD Accredited Board Certified in Sleep Medicine Date/Time: 12/15/2023 4:07PM

## 2023-12-16 ENCOUNTER — Encounter: Payer: Self-pay | Admitting: Family Medicine

## 2023-12-16 DIAGNOSIS — G4733 Obstructive sleep apnea (adult) (pediatric): Secondary | ICD-10-CM | POA: Insufficient documentation

## 2023-12-23 ENCOUNTER — Telehealth: Payer: Self-pay | Admitting: *Deleted

## 2023-12-23 DIAGNOSIS — I2510 Atherosclerotic heart disease of native coronary artery without angina pectoris: Secondary | ICD-10-CM

## 2023-12-23 DIAGNOSIS — G4733 Obstructive sleep apnea (adult) (pediatric): Secondary | ICD-10-CM

## 2023-12-23 DIAGNOSIS — I10 Essential (primary) hypertension: Secondary | ICD-10-CM

## 2023-12-23 NOTE — Telephone Encounter (Signed)
The patient has been notified of the result. Left detailed message on voicemail and informed patient to call back.

## 2023-12-23 NOTE — Telephone Encounter (Signed)
-----   Message from Wilbert Bihari sent at 12/15/2023  4:10 PM EDT ----- Please let patient know that they have sleep apnea and recommend treating with CPAP.  Please order an auto CPAP from 4-15cm H2O with heated humidity and mask of choice.  Order overnight pulse ox on CPAP.  Followup with me in 6 weeks.

## 2023-12-25 ENCOUNTER — Encounter

## 2023-12-25 NOTE — Telephone Encounter (Signed)
 The patient has been notified of the result and verbalized understanding.  All questions (if any) were answered. Bailey Hooper, CMA 12/25/2023 12:08 PM     Upon patient request DME selection is [facility] Patient understands he will be contacted by [facility] to set up his cpap. Patient understands to call if [facility] does not contact him with new setup in a timely manner. Patient understands they will be called once confirmation has been received from [facility] that they have received their new machine to schedule 10 week follow up appointment.   [facility] notified of new cpap order  Please add to airview Patient was grateful for the call and thanked me.

## 2023-12-31 ENCOUNTER — Ambulatory Visit

## 2023-12-31 NOTE — Progress Notes (Unsigned)
 Cardiology Office Note   Date:  01/01/2024  ID:  Bailey Hooper, DOB 06/04/1965, MRN [id number] PCP: Delores Suzann HERO, MD  [facility] Providers Cardiologist:  [name], MD   History of Present Illness Bailey Hooper is a 58 y.o. female with history of multivessel CAD with positive FFR on coronary CTA/18/25, resistant hypertension, hyperlipidemia, OSA, GERD, nonalcoholic fatty liver disease, bipolar disorder, depression/anxiety, and tobacco abuse here for follow-up appointment.  She establish care with Dr. Barbaraann back in 2020 for murmur and DOE.  Echo revealed normal LVEF, G2 DD, normal RV, no significant valvular disease.  Seen again by Dr. Ronal Ross 06/2023 for resistant hypertension and CAD on CT scan.  Was noncompliant with CPAP machine.  Was noncompliant with antihypertensive therapies due to dizziness.  Aldosterone renin ratio 05/2023 was elevated at 32.4 but aldosterone was less than 10 so not conclusive.  She was started on eplerenone .  Abdominal/pelvic CT ordered to assess for adrenal adenoma but not completed.  Coronary CTA completed 07/26/2023 revealing coronary calcium  score of 331 with at least moderate multivessel CAD with abnormal FFR in RCA and LCx and borderline positive FFR in LAD.  Was seen by Aline Door, PA-C on 08/06/2023 at which time she reported intermittent chest heaviness which had been occurring for years and stated her DOE was chronic.  No orthopnea.  Brief intermittent palpitations.  Also reported lightheadedness/dizziness that mostly occurs when she is driving which is followed by nausea vomiting and urgent need to have a bowel movement.  Syncopal episode approximately 2 years prior after eating cold medicine and feeling lightheaded.  She woke up with dried vomit and went to UC.  Reported feeling tired all the time.  History of sleep apnea but did not use CPAP in years.  STOP-BANG of 5.  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  She presented to our office 09/09/2023 for follow-up.  At that visit she was concerned with her blood pressure control.  She does have a funny feeling in her head but symptoms do not worsen with exertion.  No syncope or presyncope.  No lower extremity edema, orthopnea, or PND.  She does use nitroglycerin  sublingually as needed.  Prediabetic and actively was trying to smoke.  Today, she tells me that she needs some paperwork filled out to be on the first floor of her apartment complex.  She still has some occasional shortness of breath and exercise tolerance issues.  She always has dizziness and she is always dizzy when she is walking and on her feet.  She does feel like her heart rate races at times.  She still is having some chest pressure occasionally.  She takes nitroglycerin  often.    ROS: Pertinent ROS in HPI  Studies Reviewed     Echocardiogram 09/06/2023 IMPRESSIONS     1. Left ventricular ejection fraction, by estimation, is 65 to 70%. Left  ventricular ejection fraction by 3D volume is 66 %. The left ventricle has  normal function. The left ventricle has no regional wall motion  abnormalities. There is mild left  ventricular hypertrophy. Left ventricular diastolic parameters are  consistent with Grade I  diastolic dysfunction (impaired relaxation). The  average left ventricular global longitudinal strain is -18.1 %. The global  longitudinal strain is normal.   2. Right ventricular systolic function is normal. The right ventricular  size is normal. There is normal pulmonary artery systolic pressure. The  estimated right  ventricular systolic pressure is 22.9 mmHg.   3. The mitral valve is normal in structure. Trivial mitral valve  regurgitation. No evidence of mitral stenosis.   4. The aortic valve was not well visualized. Aortic valve regurgitation  is not visualized. No aortic stenosis is present.   5. The inferior vena cava is normal in size with greater than 50%  respiratory variability, suggesting right atrial pressure of 3 mmHg.   FINDINGS   Left Ventricle: Left ventricular ejection fraction, by estimation, is 65  to 70%. Left ventricular ejection fraction by 3D volume is 66 %. The left  ventricle has normal function. The left ventricle has no regional wall  motion abnormalities. The average  left ventricular global longitudinal strain is -18.1 %. Strain was  performed and the global longitudinal strain is normal. The left  ventricular internal cavity size was normal in size. There is mild left  ventricular hypertrophy. Left ventricular  diastolic parameters are consistent with Grade I diastolic dysfunction  (impaired relaxation).   Right Ventricle: The right ventricular size is normal. No increase in  right ventricular wall thickness. Right ventricular systolic function is  normal. There is normal pulmonary artery systolic pressure. The tricuspid  regurgitant velocity is 2.23 m/s, and   with an assumed right atrial pressure of 3 mmHg, the estimated right  ventricular systolic pressure is 22.9 mmHg.   Left Atrium: Left atrial size was normal in size.   Right Atrium: Right atrial size was normal in size.   Pericardium: There is no evidence of pericardial effusion.   Mitral Valve: The mitral valve is normal in structure. Trivial mitral  valve regurgitation. No evidence of mitral valve stenosis.   Tricuspid Valve: The tricuspid valve is normal in structure. Tricuspid  valve regurgitation is trivial.   Aortic Valve: The aortic valve was not well visualized. Aortic valve  regurgitation is not  visualized. No aortic stenosis is present.   Pulmonic Valve: The pulmonic valve was not well visualized. Pulmonic valve  regurgitation is trivial.   Aorta: The aortic root and ascending aorta are structurally normal, with  no evidence of dilitation.   Venous: The inferior vena cava is normal in size with greater than 50%  respiratory variability, suggesting right atrial pressure of 3 mmHg.   IAS/Shunts: The atrial septum is grossly normal.   Additional Comments: 3D was performed not requiring image post processing  on an independent workstation and was normal.   Physical Exam VS:  BP (!) 152/96 (BP Location: Left Arm, Patient Position: Sitting, Cuff Size: Normal)   Pulse 80   Ht 5' 6 (1.676 m)   Wt 188 lb (85.3 kg)   LMP 02/12/2017 Comment: not sexually active  SpO2 98%   BMI 30.34 kg/m        Wt Readings from Last 3 Encounters:  01/01/24 188 lb (85.3 kg)  12/05/23 184 lb (83.5 kg)  12/05/23 183 lb 11.2 oz (83.3 kg)    GEN: Well nourished, well developed in no acute distress NECK: No JVD; No carotid bruits CARDIAC: RRR, no murmurs, rubs, gallops RESPIRATORY:  Clear to auscultation without rales, wheezing or rhonchi  ABDOMEN: Soft, non-tender, non-distended EXTREMITIES:  No edema; No deformity  ASSESSMENT AND PLAN CAD -Reviewed cardiac catheterization with the patient which included moderate to severe multivessel CAD with 70% proximal LAD, 80% D1, multifocal LCx disease up to 70 to 80% and 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  Recommendations include escalating antianginal therapy.  Aggressive secondary prevention of CAD.  No favorable PCI targets given multifocal disease, severe tortuosity and anomalous RCA.  We discussed with her that coronary bypass grafting might be an option in the future if antianginal medications do not work. -We have uptitrated her Imdur  to 60 mg daily to further help reduce  her blood pressure and help with her anginal symptoms -We reviewed ER precautions including taking up to 3 sublingual nitroglycerin  and then if pain does not resolve to call EMS for transfer to the ER  HTN with LVH -Up titration of Imdur  to help with hypertension as well as anginal symptoms -We gave her prescription today for a blood pressure cuff as well as an oxygen saturation probe for her to fill at the pharmacy downstairs -We have encouraged her to keep track of her blood pressure and heart rate over the next 2 weeks and please send me those values -We have encouraged low-sodium, heart healthy diet  Palpitations -She does endorse frequent palpitations and fast heartbeats -We have placed a ZIO monitor on her today for further evaluation to rule out any dangerous arrhythmias -We have advised her to continue her current medications at this moment -Depending on her results we might be able to uptitrate her metoprolol  succinate to further reduce blood pressure and help suppress palpitations  Prediabetes -Encouraged close follow-up with her PCP for routine hemoglobin A1c's  OSA -This was not addressed today  Hyperlipidemia with LDL goal less than 70 -She will need a lipid panel at her next office visit - It looks like this was ordered and canceled a few times  Nicotine  dependence -Encouraged cessation of all nicotine  products     Dispo: She can follow-up in 3 months with myself or MD  Signed, Orren LOISE Fabry, PA-C

## 2024-01-01 ENCOUNTER — Encounter: Payer: Self-pay | Admitting: Physician Assistant

## 2024-01-01 ENCOUNTER — Other Ambulatory Visit: Payer: Self-pay | Admitting: Physician Assistant

## 2024-01-01 ENCOUNTER — Telehealth: Payer: Self-pay | Admitting: Physician Assistant

## 2024-01-01 ENCOUNTER — Ambulatory Visit

## 2024-01-01 ENCOUNTER — Ambulatory Visit: Attending: Cardiology | Admitting: Physician Assistant

## 2024-01-01 VITALS — BP 152/96 | HR 80 | Ht 66.0 in | Wt 188.0 lb

## 2024-01-01 DIAGNOSIS — R7303 Prediabetes: Secondary | ICD-10-CM | POA: Diagnosis present

## 2024-01-01 DIAGNOSIS — I2510 Atherosclerotic heart disease of native coronary artery without angina pectoris: Secondary | ICD-10-CM | POA: Insufficient documentation

## 2024-01-01 DIAGNOSIS — I10 Essential (primary) hypertension: Secondary | ICD-10-CM | POA: Insufficient documentation

## 2024-01-01 DIAGNOSIS — Z720 Tobacco use: Secondary | ICD-10-CM | POA: Diagnosis not present

## 2024-01-01 DIAGNOSIS — E785 Hyperlipidemia, unspecified: Secondary | ICD-10-CM | POA: Diagnosis not present

## 2024-01-01 DIAGNOSIS — R002 Palpitations: Secondary | ICD-10-CM | POA: Diagnosis not present

## 2024-01-01 DIAGNOSIS — G4733 Obstructive sleep apnea (adult) (pediatric): Secondary | ICD-10-CM | POA: Diagnosis present

## 2024-01-01 DIAGNOSIS — I517 Cardiomegaly: Secondary | ICD-10-CM

## 2024-01-01 DIAGNOSIS — R42 Dizziness and giddiness: Secondary | ICD-10-CM | POA: Insufficient documentation

## 2024-01-01 MED ORDER — ISOSORBIDE MONONITRATE ER 60 MG PO TB24: 60.0000 mg | ORAL_TABLET | Freq: Every day | ORAL | 3 refills | Status: DC

## 2024-01-01 MED ORDER — BLOOD PRESSURE CUFF MISC: 1.0000 [IU] | Freq: Once | 0 refills | Status: AC

## 2024-01-01 NOTE — Patient Instructions (Addendum)
 Medication Instructions:  INCREASE IMDUR  TO 60 MG DAILY.   Lab Work: NONE TO BE DONE TODAY.  Testing/Procedures: Bailey Hooper- Long Term Monitor Instructions  Your physician has requested you wear a ZIO patch monitor for 14 days.  This is a single patch monitor. [employer] supplies one patch monitor per enrollment. Additional stickers are not available. Please do not apply patch if you will be having a Nuclear Stress Test,  Echocardiogram, Cardiac CT, MRI, or Chest Xray during the period you would be wearing the  monitor. The patch cannot be worn during these tests. You cannot remove and re-apply the  ZIO XT patch monitor.  Your ZIO patch monitor will be mailed 3 day USPS to your address on file. It may take 3-5 days  to receive your monitor after you have been enrolled.  Once you have received your monitor, please review the enclosed instructions. Your monitor  has already been registered assigning a specific monitor serial # to you.  Billing and Patient Assistance Program Information  We have supplied [employer] with any of your insurance information on file for billing purposes. [employer] offers a sliding scale Patient Assistance Program for patients that do not have  insurance, or whose insurance does not completely cover the cost of the ZIO monitor.  You must apply for the Patient Assistance Program to qualify for this discounted rate.  To apply, please call [employer] at [phone], select option 4, select option 2, ask to apply for  Patient Assistance Program. Meredeth will ask your household income, and how many people  are in your household. They will quote your out-of-pocket cost based on that information.  [employer] will also be able to set up a 40-month, interest-free payment plan if needed.  Applying the monitor   Shave hair from upper left chest.  Hold abrader disc by orange tab. Rub abrader in 40 strokes over the upper left chest as  indicated in your monitor instructions.  Clean  area with 4 enclosed alcohol pads. Let dry.  Apply patch as indicated in monitor instructions. Patch will be placed under collarbone on left  side of chest with arrow pointing upward.  Rub patch adhesive wings for 2 minutes. Remove white label marked 1. Remove the white  label marked 2. Rub patch adhesive wings for 2 additional minutes.  While looking in a mirror, press and release button in center of patch. A small green light will  flash 3-4 times. This will be your only indicator that the monitor has been turned on.  Do not shower for the first 24 hours. You may shower after the first 24 hours.  Press the button if you feel a symptom. You will hear a small click. Record Date, Time and  Symptom in the Patient Logbook.  When you are ready to remove the patch, follow instructions on the last 2 pages of Patient  Logbook. Stick patch monitor onto the last page of Patient Logbook.  Place Patient Logbook in the blue and white box. Use locking tab on box and tape box closed  securely. The blue and white box has prepaid postage on it. Please place it in the mailbox as  soon as possible. Your physician should have your test results approximately 7 days after the  monitor has been mailed back to [facility].  Call [facility] Customer Care at [phone] if you have questions regarding  your ZIO XT patch monitor. Call them immediately if you see an orange light blinking on your  monitor.  If your monitor falls off in less than 4 days, contact our Monitor department at [phone].  If your monitor becomes loose or falls off after 4 days call [employer] at [phone] for  suggestions on securing your monitor   Follow-Up: At [facility], you and your health needs are our priority.  As part of our continuing mission to provide you with exceptional heart care, our providers are all part of one team.  This team includes your primary Cardiologist (physician) and Advanced Practice  Providers or APPs (Physician Assistants and Nurse Practitioners) who all work together to provide you with the care you need, when you need it.  Your next appointment:   3 MONTHS  Provider:   TESSA CONTE, PA-C   Other Instructions: PLEASE TAKE YOUR BLOOD PRESSURE FOR THE NEXT FEW WEEKS.

## 2024-01-01 NOTE — Telephone Encounter (Signed)
 Pt is requesting a callback regarding her office visit today. She stated Tessa filled out a form for her about her requesting a bottom floor appt but when she took it back to the complex they told her a letter had to be faxed to them at [phone] attention to Verneita Collet at [facility] regarding the pt requesting a bottom floor apt. She also stated she'll be in the office tomorrow to have a monitor placed on her around 12 noon so she'd like the paperwork for a permanent handicap tag while she's there. Please advise

## 2024-01-01 NOTE — Progress Notes (Unsigned)
 ZIO serial # [id number] from office inventory to be  applied to patient on 01/02/24.

## 2024-01-02 ENCOUNTER — Ambulatory Visit: Attending: Family Medicine

## 2024-01-03 ENCOUNTER — Encounter: Payer: Self-pay | Admitting: Physician Assistant

## 2024-01-03 NOTE — Telephone Encounter (Signed)
 Letter was faxed today to Verneita Collet.  Fax #[phone].  Thanks! Orren LOISE Fabry, PA-C

## 2024-01-07 ENCOUNTER — Ambulatory Visit (HOSPITAL_COMMUNITY): Admitting: Clinical

## 2024-01-07 ENCOUNTER — Other Ambulatory Visit: Payer: Self-pay | Admitting: Family Medicine

## 2024-01-07 DIAGNOSIS — F411 Generalized anxiety disorder: Secondary | ICD-10-CM | POA: Diagnosis not present

## 2024-01-07 DIAGNOSIS — F319 Bipolar disorder, unspecified: Secondary | ICD-10-CM

## 2024-01-07 DIAGNOSIS — F3132 Bipolar disorder, current episode depressed, moderate: Secondary | ICD-10-CM

## 2024-01-07 DIAGNOSIS — J45909 Unspecified asthma, uncomplicated: Secondary | ICD-10-CM

## 2024-01-07 NOTE — Progress Notes (Signed)
 Virtual Visit via Video Note   I connected with Bailey Hooper on 01/07/24 at  1:00 PM EDT by a video enabled telemedicine application and verified that I am speaking with the correct person using two identifiers.   Location: Patient: home Provider: office   I discussed the limitations of evaluation and management by telemedicine and the availability of in person appointments. The patient expressed understanding and agreed to proceed.   THERAPIST PROGRESS NOTE   Session Time: 1:00 PM-1:30 PM   Participation Level: Active   Behavioral Response: CasualAlertAnxious   Type of Therapy: Individual Therapy   Treatment Goals addressed: Coping   Interventions: CBT   Summary: Carole Doner is a 58 y.o. female who presents with Bipolar Disorder./ GAD. The OPT therapist worked with the patient for her scheduled OPT session. The OPT therapist utilized Motivational Interviewing to assist in creating therapeutic repore. The patient in the session was engaged and work in collaboration giving feedback about her triggers and symptoms over the past few weeks. The patient spoke about involvement in her Physical Therapy program and spoke about increase in her medication for her heart condition.The patient spoke about her daughter deciding to move to [place]  and will be moving in the upcoming weekend. The patient spoke about difficulty with her depression and difficulty with fatigue, irritability, sleep cycle, and eating habits she feels in part triggered by the daughters upcoming move with her acknowledgement of the symptoms being interconnected. The patient spoke about upcoming sleep study and C pap appointment. The OPT therapist utilized Cognitive Behavioral Therapy through cognitive restructuring as well as worked with the patient on coping strategies to assist in management of mood and as she continues to work on family interactions,  finances, physical and mental health. The patient spoke about her improved  awareness of where her stress boundaries are and being aware of when she needs to implement coping to create balance in her life. The patient spoke about trying to find a balance between her health needs and her work hours to manage rising costs. The patient spoke about the impact of her daughter deciding to move to [place]  which makes it more difficulty for the patient to interact with her grandchildren. The OPT therapist in working with the patient prioritized regulations of sleep cycle and the patient will be within the next week utilizing new C-PAP device.   Suicidal/Homicidal: Nowithout intent/plan   Therapist Response: The OPT therapist worked with the patient for the patients scheduled session. The patient was engaged in her session and gave feedback in relation to triggers, symptoms, and behavior responses over the past few weeks.The patient spoke about  frustration around the political climate. The patient spoke about her plan to file an appeal and continue her efforts to get an approval for the disability claim while also looking for part time work. The OPT therapist worked with the patient utilizing an in session Cognitive Behavioral Therapy exercise. The patient was responsive in the session and verbalized,  I have realized I can not do anything but say to my daughter I do not think moving to [place]  is a good idea but I realize it is her choice so I said if you do move be safe, lock your doors, and be careful around people. The OPT therapist worked with the patient overviewing basic care needs including eating, sleeping, exercise, and hygenie. The patient identified her need to focus on herself and realization she cannot work outside of what is in her control. The  OPT therapist continued to work with the patient on mindfulness DBT and her work/life balance while promoting the patient continuing to follow the directives of her health professionals and will be implementing a new C-PAP device to her  her with regulation of her sleep cycle.The OPT therapist will continue treatment work with the patient in her next scheduled session.   Plan: Return again in 3 weeks.   Diagnosis:      Axis I: Bipolar Disorder/ GAD                             Axis II: No diagnosis   Collaboration of Care: No additional collaboration of care for this session.    Patient/Guardian was advised Release of Information must be obtained prior to any record release in order to collaborate their care with an outside provider. Patient/Guardian was advised if they have not already done so to contact the registration department to sign all necessary forms in order for us  to release information regarding their care.    Consent: Patient/Guardian gives verbal consent for treatment and assignment of benefits for services provided during this visit. Patient/Guardian expressed understanding and agreed to proceed      I discussed the assessment and treatment plan with the patient. The patient was provided an opportunity to ask questions and all were answered. The patient agreed with the plan and demonstrated an understanding of the instructions.   The patient was advised to call back or seek an in-person evaluation if the symptoms worsen or if the condition fails to improve as anticipated.   I provided 30  minutes of non-face-to-face time during this encounter.   Jerel ONEIDA Pepper, LCSW   01/07/2024

## 2024-01-08 DIAGNOSIS — R1013 Epigastric pain: Secondary | ICD-10-CM | POA: Diagnosis not present

## 2024-01-08 DIAGNOSIS — R1114 Bilious vomiting: Secondary | ICD-10-CM | POA: Diagnosis not present

## 2024-01-08 DIAGNOSIS — Z9889 Other specified postprocedural states: Secondary | ICD-10-CM | POA: Diagnosis not present

## 2024-01-08 DIAGNOSIS — Z800 Family history of malignant neoplasm of digestive organs: Secondary | ICD-10-CM | POA: Diagnosis not present

## 2024-01-08 DIAGNOSIS — K2930 Chronic superficial gastritis without bleeding: Secondary | ICD-10-CM | POA: Diagnosis not present

## 2024-01-08 DIAGNOSIS — K5790 Diverticulosis of intestine, part unspecified, without perforation or abscess without bleeding: Secondary | ICD-10-CM | POA: Diagnosis not present

## 2024-01-08 DIAGNOSIS — K219 Gastro-esophageal reflux disease without esophagitis: Secondary | ICD-10-CM | POA: Diagnosis not present

## 2024-01-08 DIAGNOSIS — Z860101 Personal history of adenomatous and serrated colon polyps: Secondary | ICD-10-CM | POA: Diagnosis not present

## 2024-01-08 DIAGNOSIS — K582 Mixed irritable bowel syndrome: Secondary | ICD-10-CM | POA: Diagnosis not present

## 2024-01-08 DIAGNOSIS — K760 Fatty (change of) liver, not elsewhere classified: Secondary | ICD-10-CM | POA: Diagnosis not present

## 2024-01-08 DIAGNOSIS — K222 Esophageal obstruction: Secondary | ICD-10-CM | POA: Diagnosis not present

## 2024-01-08 DIAGNOSIS — K31A0 Gastric intestinal metaplasia, unspecified: Secondary | ICD-10-CM | POA: Diagnosis not present

## 2024-01-09 DIAGNOSIS — F341 Dysthymic disorder: Secondary | ICD-10-CM | POA: Diagnosis not present

## 2024-01-10 ENCOUNTER — Encounter: Payer: Self-pay | Admitting: Family Medicine

## 2024-01-10 DIAGNOSIS — J45909 Unspecified asthma, uncomplicated: Secondary | ICD-10-CM

## 2024-01-10 MED ORDER — ALBUTEROL SULFATE HFA 108 (90 BASE) MCG/ACT IN AERS: 2.0000 | INHALATION_SPRAY | Freq: Four times a day (QID) | RESPIRATORY_TRACT | 2 refills | Status: DC | PRN

## 2024-01-15 ENCOUNTER — Ambulatory Visit: Admitting: Dermatology

## 2024-01-19 ENCOUNTER — Encounter (HOSPITAL_BASED_OUTPATIENT_CLINIC_OR_DEPARTMENT_OTHER): Admitting: Cardiology

## 2024-02-03 ENCOUNTER — Ambulatory Visit (INDEPENDENT_AMBULATORY_CARE_PROVIDER_SITE_OTHER): Admitting: Clinical

## 2024-02-03 DIAGNOSIS — F411 Generalized anxiety disorder: Secondary | ICD-10-CM

## 2024-02-03 DIAGNOSIS — F3132 Bipolar disorder, current episode depressed, moderate: Secondary | ICD-10-CM

## 2024-02-03 NOTE — Progress Notes (Signed)
 Virtual Visit via Video Note   I connected with Bailey Hooper on 02/03/24 at  1:00 PM EDT by a video enabled telemedicine application and verified that I am speaking with the correct person using two identifiers.   Location: Patient: home Provider: office   I discussed the limitations of evaluation and management by telemedicine and the availability of in person appointments. The patient expressed understanding and agreed to proceed.   THERAPIST PROGRESS NOTE   Session Time: 1:00 PM-1:30 PM   Participation Level: Active   Behavioral Response: CasualAlertAnxious   Type of Therapy: Individual Therapy   Treatment Goals addressed: Coping   Interventions: CBT   Summary: Bailey Hooper is a 58 y.o. female who presents with Bipolar Disorder./ GAD. The OPT therapist worked with the patient for her scheduled OPT session. The OPT therapist utilized Motivational Interviewing to assist in creating therapeutic repore. The patient in the session was engaged and work in collaboration giving feedback about her triggers and symptoms over the past few weeks. The patient spoke about her daughter moving to [place]  . The patient spoke about difficulty with her depression and difficulty with fatigue, irritability, sleep cycle, and eating habits. The patient spoke about getting a C pap and has been trying to use it at night. The OPT therapist utilized Cognitive Behavioral Therapy through cognitive restructuring as well as worked with the patient on coping strategies to assist in management of mood and as she continues to work on family interactions,  finances, physical and mental health. The patient spoke about her improved awareness of where her stress boundaries are and being aware of when she needs to implement coping to create balance in her life. The patient spoke about trying to find a balance between her health needs and her work hours to manage rising costs. The patient spoke about the impact of her daughter  moving to [place] . The patient spoke about looking for a dentist and adding this to her healthcare. The patient spoke about her upcoming appointment with her psychiatrist. The patient spoke about spending time with a friend recently who came and stayed but was not leaving and this forced the patient to set boundaries and tell the friend she could not continue to stay.   Suicidal/Homicidal: Nowithout intent/plan   Therapist Response: The OPT therapist worked with the patient for the patients scheduled session. The patient was engaged in her session and gave feedback in relation to triggers, symptoms, and behavior responses over the past few weeks. The patient spoke about her plan to  continue her efforts to get an approval for the disability.  The patient had a friend come over to hangout and then had difficulty getting the friend to stay after a few days and had to set boundaries.The OPT therapist worked with the patient utilizing an in session Cognitive Behavioral Therapy exercise. The patient was responsive in the session and verbalized,  I realized that I just need positivity in my life and around me. The OPT therapist worked with the patient overviewing basic care needs including eating, sleeping, exercise, and hygenie. The patient identified her need to focus on herself and realization she cannot work outside of what is in her control. The OPT therapist continued to work with the patient on mindfulness DBT and her work/life balance while promoting the patient continuing to follow the directives of her health professionals. The patient has been utilizing a new C-PAP device to her her with regulation of her sleep cycle. The patient spoke about  her desire to socialize and have friends, but struggles with other people bringing their problems into the relationship or on the other hand feeling like she's being excluded. The OPT therapist worked with the patient on healthy peer relationships and the importance  of mutual benefit from peer interactions.The OPT therapist will continue treatment work with the patient in her next scheduled session.   Plan: Return again in 3 weeks.   Diagnosis:      Axis I: Bipolar Disorder/ GAD                             Axis II: No diagnosis   Collaboration of Care: No additional collaboration of care for this session.    Patient/Guardian was advised Release of Information must be obtained prior to any record release in order to collaborate their care with an outside provider. Patient/Guardian was advised if they have not already done so to contact the registration department to sign all necessary forms in order for us  to release information regarding their care.    Consent: Patient/Guardian gives verbal consent for treatment and assignment of benefits for services provided during this visit. Patient/Guardian expressed understanding and agreed to proceed      I discussed the assessment and treatment plan with the patient. The patient was provided an opportunity to ask questions and all were answered. The patient agreed with the plan and demonstrated an understanding of the instructions.   The patient was advised to call back or seek an in-person evaluation if the symptoms worsen or if the condition fails to improve as anticipated.   I provided 30  minutes of non-face-to-face time during this encounter.   Jerel ONEIDA Pepper, LCSW   02/03/2024

## 2024-02-06 ENCOUNTER — Ambulatory Visit (INDEPENDENT_AMBULATORY_CARE_PROVIDER_SITE_OTHER): Admitting: Podiatry

## 2024-02-06 ENCOUNTER — Encounter: Payer: Self-pay | Admitting: Podiatry

## 2024-02-06 DIAGNOSIS — M2141 Flat foot [pes planus] (acquired), right foot: Secondary | ICD-10-CM | POA: Diagnosis not present

## 2024-02-06 DIAGNOSIS — M2142 Flat foot [pes planus] (acquired), left foot: Secondary | ICD-10-CM

## 2024-02-06 DIAGNOSIS — L600 Ingrowing nail: Secondary | ICD-10-CM | POA: Diagnosis not present

## 2024-02-07 NOTE — Progress Notes (Signed)
  Subjective:  Patient ID: Bailey Hooper, female    DOB: Sep 02, 1965,  MRN: [id number]  Chief Complaint  Patient presents with   Nail Problem    Patient is here for routine foot care and callous trim     58 year old female, with a history of foot surgery, presents with a painful callus on the foot that has been causing discomfort as well as for ingrown toenails to her big toes.  She was able to trim the other toenails.  Does not report any drainage or pus.  She does not report any open lesions.  She has been using the urea cream which does help.  Objective:    Physical Exam         General: AAO x3, NAD  Dermatological: Incurvation present of the hallux toenails without any edema, erythema or signs of infection.  No open lesions.  Vascular: Dorsalis Pedis artery and Posterior Tibial artery pedal pulses are 2/4 bilateral with immedate capillary fill time.  There is no pain with calf compression, swelling, warmth, erythema.   Neruologic: Grossly intact via light touch bilateral.   Musculoskeletal: Flatfoot is present.  Prominent metatarsal head plantarly.    No area pinpoint tenderness.  Gait: Unassisted, Nonantalgic.         Assessment:   Pes planovalgus; symptomatic onychomycosis  Plan:  Pes planovalgus -Continue shoes, good arch support.  She also has a callus submetatarsal 2, prominent metatarsal head.  This appears to be improving.  As a courtesy debride that any complications or bleeding.  Discussed urea on a more regular basis.  Consider surgical intervention in the future if needed.  Ingrown toenails - We discussed partial nail avulsion which was ordered on this today.  I was able to debride the corners without any complications or bleeding.   [name] DPM

## 2024-02-09 DIAGNOSIS — F341 Dysthymic disorder: Secondary | ICD-10-CM | POA: Diagnosis not present

## 2024-02-14 ENCOUNTER — Other Ambulatory Visit: Payer: Self-pay | Admitting: *Deleted

## 2024-02-14 DIAGNOSIS — F341 Dysthymic disorder: Secondary | ICD-10-CM | POA: Diagnosis not present

## 2024-02-14 DIAGNOSIS — G4733 Obstructive sleep apnea (adult) (pediatric): Secondary | ICD-10-CM | POA: Diagnosis not present

## 2024-02-14 MED ORDER — ATORVASTATIN CALCIUM 80 MG PO TABS: 80.0000 mg | ORAL_TABLET | Freq: Every day | ORAL | 3 refills | Status: AC

## 2024-02-19 ENCOUNTER — Other Ambulatory Visit: Payer: Self-pay | Admitting: Student

## 2024-02-25 DIAGNOSIS — F4320 Adjustment disorder, unspecified: Secondary | ICD-10-CM | POA: Diagnosis not present

## 2024-02-25 DIAGNOSIS — F410 Panic disorder [episodic paroxysmal anxiety] without agoraphobia: Secondary | ICD-10-CM | POA: Diagnosis not present

## 2024-02-25 DIAGNOSIS — F331 Major depressive disorder, recurrent, moderate: Secondary | ICD-10-CM | POA: Diagnosis not present

## 2024-02-27 ENCOUNTER — Ambulatory Visit (INDEPENDENT_AMBULATORY_CARE_PROVIDER_SITE_OTHER): Admitting: Clinical

## 2024-02-27 DIAGNOSIS — F411 Generalized anxiety disorder: Secondary | ICD-10-CM

## 2024-02-27 DIAGNOSIS — F3132 Bipolar disorder, current episode depressed, moderate: Secondary | ICD-10-CM

## 2024-02-27 NOTE — Progress Notes (Signed)
 Virtual Visit via Video Note   I connected with Bailey Hooper on 02/27/24 at  1:00 PM EDT by a video enabled telemedicine application and verified that I am speaking with the correct person using two identifiers.   Location: Patient: home Provider: office   I discussed the limitations of evaluation and management by telemedicine and the availability of in person appointments. The patient expressed understanding and agreed to proceed.   THERAPIST PROGRESS NOTE   Session Time: 1:00 PM-1:30 PM   Participation Level: Active   Behavioral Response: CasualAlertAnxious   Type of Therapy: Individual Therapy   Treatment Goals addressed: Coping   Interventions: CBT   Summary: Bailey Hooper is a 58 y.o. female who presents with Bipolar Disorder./ GAD. The OPT therapist worked with the patient for her scheduled OPT session. The OPT therapist utilized Motivational Interviewing to assist in creating therapeutic repore. The patient in the session was engaged and work in collaboration giving feedback about her triggers and symptoms over the past few weeks. The patient spoke about her her depression and difficulty with fatigue, irritability, sleep cycle, and eating habits. The patient spoke about concern with her heart problems and feeling this is contributing to her lack of energy but continues to take her cardiac medication.The OPT therapist utilized Cognitive Behavioral Therapy through cognitive restructuring as well as worked with the patient on coping strategies to assist in management of mood and as she continues to work on family interactions,  finances, physical and mental health. The patient spoke about due to holidays feeling more overwhelmed.The patient spoke about her improved awareness of where her stress boundaries are and being aware of when she needs to implement coping to create balance in her life. The patient spoke about her daughter and granddaughter being in [place]  and not being able to come  be with her for Thanksgiving due to travel costs. The patient spoke about recent inner family conflict involving the patients sister/granddaughter over clothing dispute and the patient spoke about this being a stressor for the patient who got pulled into the triangulation..   Suicidal/Homicidal: Nowithout intent/plan   Therapist Response: The OPT therapist worked with the patient for the patients scheduled session. The patient was engaged in her session and gave feedback in relation to triggers, symptoms, and behavior responses over the past few weeks. The patient spoke about her plan to  continue her efforts to get an approval for the disability. The patient spoke about her Mother calling the patients grandaughter being a thief taking a jacket of the patients sisters daughter and this caused a family split and conflict with people taking sides and getting pulled into the conflict all prior to the Thanksgiving holiday..The OPT therapist worked with the patient utilizing an in session Cognitive Behavioral Therapy exercise. The patient was responsive in the session and verbalized,  I feel like I am crazy because I am involved with my family and my family is crazy. The OPT therapist worked with the patient overviewing basic care needs including eating, sleeping, exercise, and hygenie. The patient identified her need to focus on herself and realization she cannot work outside of what is in her control. The OPT therapist continued to work with the patient on mindfulness DBT and her work/life balance while promoting the patient continuing to follow the directives of her health professionals. The OPT therapist worked with the patient on healthy family relationships and the importance of not allowing herself to be pulled into family conflicts and inner family triangulation.  The patient who acknowledged her need to manage her relationship with her family even if this means limited or no interaction. The patient spoke  about due to her stress she has gotten back into smoking which the OPT therapist gave the disclaimer of the risk in particular with the patient already having cardiac problems. While acknowledging the smoking as a negative coping skill the OPT therapist worked with the patient on limiting her triggering and replacing her negative coping with positive coping. The OPT therapist will continue treatment work with the patient in her next scheduled session.   Plan: Return again in 3 weeks.   Diagnosis:      Axis I: Bipolar Disorder/ GAD                             Axis II: No diagnosis   Collaboration of Care: No additional collaboration of care for this session.    Patient/Guardian was advised Release of Information must be obtained prior to any record release in order to collaborate their care with an outside provider. Patient/Guardian was advised if they have not already done so to contact the registration department to sign all necessary forms in order for us  to release information regarding their care.    Consent: Patient/Guardian gives verbal consent for treatment and assignment of benefits for services provided during this visit. Patient/Guardian expressed understanding and agreed to proceed      I discussed the assessment and treatment plan with the patient. The patient was provided an opportunity to ask questions and all were answered. The patient agreed with the plan and demonstrated an understanding of the instructions.   The patient was advised to call back or seek an in-person evaluation if the symptoms worsen or if the condition fails to improve as anticipated.   I provided 30  minutes of non-face-to-face time during this encounter.   Jerel ONEIDA Pepper, LCSW   02/27/2024

## 2024-03-10 DIAGNOSIS — G4733 Obstructive sleep apnea (adult) (pediatric): Secondary | ICD-10-CM | POA: Diagnosis not present

## 2024-03-10 DIAGNOSIS — F341 Dysthymic disorder: Secondary | ICD-10-CM | POA: Diagnosis not present

## 2024-03-11 ENCOUNTER — Encounter (HOSPITAL_BASED_OUTPATIENT_CLINIC_OR_DEPARTMENT_OTHER): Payer: Self-pay | Admitting: *Deleted

## 2024-03-16 ENCOUNTER — Telehealth: Payer: Self-pay

## 2024-03-16 ENCOUNTER — Telehealth: Payer: Self-pay | Admitting: Student

## 2024-03-16 NOTE — Telephone Encounter (Signed)
**Note De-Identified Dashonda Bonneau Obfuscation** The pts Itamar-HST device was left at the office in its original unopened box.  The pt stated that she had her Sleep test done while in a facility so she no longer needs the Itamar-HST device.  I have unregistered the device in [employer] and placed the device back in our stock cabinet. Itamar-HST Device SN: [id number]

## 2024-03-16 NOTE — Telephone Encounter (Signed)
 Reason for walk-in: Walk-in Reasons: form/record drop-off (for provider mailbox or FMLA, etc.) Pt inquiring about handicap placard paperwork that was mentioned during her visit with Aline Door; however, pt also mentioned [name] in the same regard.  If patient is requesting to be seen today, or if patient is having symptoms:  What symptoms are being reported (if any)?  N/A  Route to triage pool and ensure Teams message has been sent to the Triage Walk-In chat.  3.   For medication samples, medication refills, HIM requests, appointment requests, lab-related requests, or form/record drop-off, please route to the appropriate pool.

## 2024-03-16 NOTE — Telephone Encounter (Signed)
 Pt completed her portion of the Parking Placard application.  Paperwork left in T.Conte's mailbox to be completed by Provider. Pt requests to be contacted once form is complete: [phone].  JB, 03-16-24

## 2024-03-17 ENCOUNTER — Other Ambulatory Visit: Payer: Self-pay | Admitting: Family Medicine

## 2024-03-17 DIAGNOSIS — J45909 Unspecified asthma, uncomplicated: Secondary | ICD-10-CM

## 2024-03-17 NOTE — Progress Notes (Unsigned)
 Cardiology Office Note   Date:  03/17/2024  ID:  Bailey Hooper, DOB Aug 30, 1965, MRN [id number] PCP: Delores Suzann HERO, MD  [facility] Providers Cardiologist:  [name], MD   History of Present Illness Bailey Hooper is a 58 y.o. female with history of multivessel CAD with positive FFR on coronary CTA/18/25, resistant hypertension, hyperlipidemia, OSA, GERD, nonalcoholic fatty liver disease, bipolar disorder, depression/anxiety, and tobacco abuse here for follow-up appointment.  She establish care with Dr. Barbaraann back in 2020 for murmur and DOE.  Echo revealed normal LVEF, G2 DD, normal RV, no significant valvular disease.  Seen again by Dr. Ronal Ross 06/2023 for resistant hypertension and CAD on CT scan.  Was noncompliant with CPAP machine.  Was noncompliant with antihypertensive therapies due to dizziness.  Aldosterone renin ratio 05/2023 was elevated at 32.4 but aldosterone was less than 10 so not conclusive.  She was started on eplerenone .  Abdominal/pelvic CT ordered to assess for adrenal adenoma but not completed.  Coronary CTA completed 07/26/2023 revealing coronary calcium  score of 331 with at least moderate multivessel CAD with abnormal FFR in RCA and LCx and borderline positive FFR in LAD.  Was seen by Aline Door, PA-C on 08/06/2023 at which time she reported intermittent chest heaviness which had been occurring for years and stated her DOE was chronic.  No orthopnea.  Brief intermittent palpitations.  Also reported lightheadedness/dizziness that mostly occurs when she is driving which is followed by nausea vomiting and urgent need to have a bowel movement.  Syncopal episode approximately 2 years prior after eating cold medicine and feeling lightheaded.  She woke up with dried vomit and went to UC.  Reported feeling tired all the time.  History of sleep apnea but did not use CPAP in years.  STOP-BANG of 5.  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  She presented to our office 09/09/2023 for follow-up.  At that visit she was concerned with her blood pressure control.  She does have a funny feeling in her head but symptoms do not worsen with exertion.  No syncope or presyncope.  No lower extremity edema, orthopnea, or PND.  She does use nitroglycerin  sublingually as needed.  Prediabetic and actively was trying to smoke.  I saw her 12/2023, she tells me that she needs some paperwork filled out to be on the first floor of her apartment complex.  She still has some occasional shortness of breath and exercise tolerance issues.  She always has dizziness and she is always dizzy when she is walking and on her feet.  She does feel like her heart rate races at times.  She still is having some chest pressure occasionally.  She takes nitroglycerin  often.  Today, she***  ROS: Pertinent ROS in HPI  Studies Reviewed     Echocardiogram 09/06/2023 IMPRESSIONS     1. Left ventricular ejection fraction, by estimation, is 65 to 70%. Left  ventricular ejection fraction by 3D volume is 66 %. The left ventricle has  normal function. The left ventricle has no regional wall motion  abnormalities. There is mild left  ventricular hypertrophy. Left ventricular diastolic parameters are  consistent with Grade I diastolic dysfunction (impaired relaxation). The  average left ventricular global longitudinal strain is -18.1 %. The global  longitudinal strain is normal.   2. Right ventricular systolic function is normal. The right ventricular  size is normal. There is normal pulmonary artery systolic pressure. The   estimated right ventricular systolic pressure is 22.9 mmHg.   3. The mitral valve is normal in structure. Trivial mitral valve  regurgitation. No evidence of mitral stenosis.   4. The aortic valve was not well visualized. Aortic valve regurgitation  is not visualized. No aortic stenosis is present.   5. The inferior vena cava is normal in size with greater than 50%  respiratory variability, suggesting right atrial pressure of 3 mmHg.   FINDINGS   Left Ventricle: Left ventricular ejection fraction, by estimation, is 65  to 70%. Left ventricular ejection fraction by 3D volume is 66 %. The left  ventricle has normal function. The left ventricle has no regional wall  motion abnormalities. The average  left ventricular global longitudinal strain is -18.1 %. Strain was  performed and the global longitudinal strain is normal. The left  ventricular internal cavity size was normal in size. There is mild left  ventricular hypertrophy. Left ventricular  diastolic parameters are consistent with Grade I diastolic dysfunction  (impaired relaxation).   Right Ventricle: The right ventricular size is normal. No increase in  right ventricular wall thickness. Right ventricular systolic function is  normal. There is normal pulmonary artery systolic pressure. The tricuspid  regurgitant velocity is 2.23 m/s, and   with an assumed right atrial pressure of 3 mmHg, the estimated right  ventricular systolic pressure is 22.9 mmHg.   Left Atrium: Left atrial size was normal in size.   Right Atrium: Right atrial size was normal in size.   Pericardium: There is no evidence of pericardial effusion.   Mitral Valve: The mitral valve is normal in structure. Trivial mitral  valve regurgitation. No evidence of mitral valve stenosis.   Tricuspid Valve: The tricuspid valve is normal in structure. Tricuspid  valve regurgitation is trivial.   Aortic Valve: The aortic valve was not well visualized. Aortic valve   regurgitation is not visualized. No aortic stenosis is present.   Pulmonic Valve: The pulmonic valve was not well visualized. Pulmonic valve  regurgitation is trivial.   Aorta: The aortic root and ascending aorta are structurally normal, with  no evidence of dilitation.   Venous: The inferior vena cava is normal in size with greater than 50%  respiratory variability, suggesting right atrial pressure of 3 mmHg.   IAS/Shunts: The atrial septum is grossly normal.   Additional Comments: 3D was performed not requiring image post processing  on an independent workstation and was normal.   Physical Exam VS:  LMP 02/12/2017 Comment: not sexually active       Wt Readings from Last 3 Encounters:  01/01/24 188 lb (85.3 kg)  12/05/23 184 lb (83.5 kg)  12/05/23 183 lb 11.2 oz (83.3 kg)    GEN: Well nourished, well developed in no acute distress NECK: No JVD; No carotid bruits CARDIAC: RRR, no murmurs, rubs, gallops RESPIRATORY:  Clear to auscultation without rales, wheezing or rhonchi  ABDOMEN: Soft, non-tender, non-distended EXTREMITIES:  No edema; No deformity   ASSESSMENT AND PLAN CAD -Reviewed cardiac catheterization with the patient which included moderate to severe multivessel CAD with 70% proximal LAD, 80% D1, multifocal LCx disease up to 70 to 80% and 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  Recommendations include escalating antianginal therapy.  Aggressive secondary prevention of CAD.  No favorable PCI targets given multifocal disease, severe tortuosity and anomalous RCA.  We discussed with her that coronary bypass grafting might be an option in the future if antianginal medications do not work. -We have uptitrated her Imdur  to 60 mg daily to further help reduce her blood pressure and help with her anginal symptoms -We reviewed ER precautions including taking up to 3 sublingual nitroglycerin  and then if pain  does not resolve to call EMS for transfer to the ER  HTN with LVH -Up titration of Imdur  to help with hypertension as well as anginal symptoms -We gave her prescription today for a blood pressure cuff as well as an oxygen saturation probe for her to fill at the pharmacy downstairs -We have encouraged her to keep track of her blood pressure and heart rate over the next 2 weeks and please send me those values -We have encouraged low-sodium, heart healthy diet  Palpitations -She does endorse frequent palpitations and fast heartbeats -We have placed a ZIO monitor on her today for further evaluation to rule out any dangerous arrhythmias -We have advised her to continue her current medications at this moment -Depending on her results we might be able to uptitrate her metoprolol  succinate to further reduce blood pressure and help suppress palpitations  Prediabetes -Encouraged close follow-up with her PCP for routine hemoglobin A1c's  OSA -This was not addressed today  Hyperlipidemia with LDL goal less than 70 -She will need a lipid panel at her next office visit - It looks like this was ordered and canceled a few times  Nicotine  dependence -Encouraged cessation of all nicotine  products     Dispo: She can follow-up in 3 months with myself or MD  Signed, Orren LOISE Fabry, PA-C

## 2024-03-17 NOTE — Telephone Encounter (Signed)
 Patient has been notified that Orren has completed her handicap placard and is ready for pick up however the patient has an appointment scheduled on Friday 03/20/24 and can get the form then. The patient is agreeable to wait until Friday and thanked me for calling.

## 2024-03-20 ENCOUNTER — Encounter: Payer: Self-pay | Admitting: Physician Assistant

## 2024-03-20 ENCOUNTER — Telehealth (HOSPITAL_BASED_OUTPATIENT_CLINIC_OR_DEPARTMENT_OTHER): Payer: Self-pay

## 2024-03-20 ENCOUNTER — Ambulatory Visit: Attending: Physician Assistant | Admitting: Physician Assistant

## 2024-03-20 VITALS — BP 124/86 | HR 79 | Ht 66.0 in | Wt 191.0 lb

## 2024-03-20 DIAGNOSIS — R42 Dizziness and giddiness: Secondary | ICD-10-CM

## 2024-03-20 DIAGNOSIS — G4733 Obstructive sleep apnea (adult) (pediatric): Secondary | ICD-10-CM | POA: Diagnosis not present

## 2024-03-20 DIAGNOSIS — Z720 Tobacco use: Secondary | ICD-10-CM | POA: Diagnosis not present

## 2024-03-20 DIAGNOSIS — I10 Essential (primary) hypertension: Secondary | ICD-10-CM

## 2024-03-20 DIAGNOSIS — E785 Hyperlipidemia, unspecified: Secondary | ICD-10-CM | POA: Diagnosis not present

## 2024-03-20 DIAGNOSIS — I2510 Atherosclerotic heart disease of native coronary artery without angina pectoris: Secondary | ICD-10-CM

## 2024-03-20 DIAGNOSIS — E782 Mixed hyperlipidemia: Secondary | ICD-10-CM | POA: Diagnosis not present

## 2024-03-20 DIAGNOSIS — R002 Palpitations: Secondary | ICD-10-CM | POA: Diagnosis not present

## 2024-03-20 DIAGNOSIS — I517 Cardiomegaly: Secondary | ICD-10-CM

## 2024-03-20 MED ORDER — ISOSORBIDE MONONITRATE ER 60 MG PO TB24: 90.0000 mg | ORAL_TABLET | Freq: Every day | ORAL | 3 refills | Status: AC

## 2024-03-20 NOTE — Telephone Encounter (Signed)
° °  Pre-operative Risk Assessment    Patient Name: Bailey Hooper  DOB: Jun 03, 1965 MRN: [id number]   Date of last office visit: 01/01/24 with Lucien Date of next office visit: 03/20/24 with Lucien   Request for Surgical Clearance    Procedure:  Right Carpal Tunnel Release  Date of Surgery:  Clearance TBD                                 Surgeon:  Victory Gunnels  Surgeon's Group or Practice Name:  [facility] Phone number:  [phone] Fax number:  [phone]   Type of Clearance Requested:   - Medical  - Pharmacy:  Hold Aspirin  not indicated   Type of Anesthesia:  MAC/ local   Additional requests/questions:    [name]   03/20/2024, 9:05 AM

## 2024-03-20 NOTE — Telephone Encounter (Signed)
 Pt has an appt on 12/12 with [name]. Preop clearance added to notes and Orren is aware.

## 2024-03-20 NOTE — Telephone Encounter (Signed)
° °  Name: Bailey Hooper  DOB: 03-Jun-1965  MRN: [id number]  Primary Cardiologist: [name], MD  Chart reviewed as part of pre-operative protocol coverage. Because of [name]'s past medical history and time since last visit, she will require a follow-up in-office visit in order to better assess preoperative cardiovascular risk.  Patient has continued angina and hypertension symptoms on last visit. Will need to be reevaluated in the office due to ongoing symptoms and possible referral for CABG if symptoms persist.   Pre-op covering staff: - Please schedule appointment and call patient to inform them. If patient already had an upcoming appointment within acceptable timeframe, please add pre-op clearance to the appointment notes so provider is aware. - Please contact requesting surgeon's office via preferred method (i.e, phone, fax) to inform them of need for appointment prior to surgery.   Lamarr Satterfield, NP  03/20/2024, 9:11 AM

## 2024-03-20 NOTE — Patient Instructions (Signed)
 Medication Instructions:  INCREASE Imdur  to 90mg  Take 1.5 tablets (90mg ) once a day-PLEASE CONTACT THE OFFICE AND LET US  KNOW IF THE CHEST PAIN FREQUENCY IS BETTER *If you need a refill on your cardiac medications before your next appointment, please call your pharmacy*  Lab Work: TODAY-A1C, CMET & LIPIDS If you have labs (blood work) drawn today and your tests are completely normal, you will receive your results only by: MyChart Message (if you have MyChart) OR A paper copy in the mail If you have any lab test that is abnormal or we need to change your treatment, we will call you to review the results.  Testing/Procedures: NONE ORDERED  Follow-Up: At [facility], you and your health needs are our priority.  As part of our continuing mission to provide you with exceptional heart care, our providers are all part of one team.  This team includes your primary Cardiologist (physician) and Advanced Practice Providers or APPs (Physician Assistants and Nurse Practitioners) who all work together to provide you with the care you need, when you need it.  Your next appointment:   6 month(s)  Provider:   [name], MD  or [name], PA  We recommend signing up for the patient portal called MyChart.  Sign up information is provided on this After Visit Summary.  MyChart is used to connect with patients for Virtual Visits (Telemedicine).  Patients are able to view lab/test results, encounter notes, upcoming appointments, etc.  Non-urgent messages can be sent to your provider as well.   To learn more about what you can do with MyChart, go to [email or web address].   Other Instructions YOU ARE CLEARED FOR YOUR SURGERY

## 2024-03-21 LAB — HEMOGLOBIN A1C
Est. average glucose Bld gHb Est-mCnc: 146 mg/dL
Hgb A1c MFr Bld: 6.7 % — ABNORMAL HIGH (ref 4.8–5.6)

## 2024-03-21 LAB — LIPID PANEL
Chol/HDL Ratio: 3.2 ratio (ref 0.0–4.4)
Cholesterol, Total: 137 mg/dL (ref 100–199)
HDL: 43 mg/dL (ref >39)
LDL Chol Calc (NIH): 72 mg/dL (ref 0–99)
Triglycerides: 120 mg/dL (ref 0–149)
VLDL Cholesterol Cal: 22 mg/dL (ref 5–40)

## 2024-03-21 LAB — COMPREHENSIVE METABOLIC PANEL WITH GFR
ALT: 17 IU/L (ref 0–32)
AST: 15 IU/L (ref 0–40)
Albumin: 4.6 g/dL (ref 3.8–4.9)
Alkaline Phosphatase: 121 IU/L (ref 49–135)
BUN/Creatinine Ratio: 17 (ref 9–23)
BUN: 13 mg/dL (ref 6–24)
Bilirubin Total: 0.4 mg/dL (ref 0.0–1.2)
CO2: 26 mmol/L (ref 20–29)
Calcium: 9.6 mg/dL (ref 8.7–10.2)
Chloride: 98 mmol/L (ref 96–106)
Creatinine, Ser: 0.75 mg/dL (ref 0.57–1.00)
Globulin, Total: 2.9 g/dL (ref 1.5–4.5)
Glucose: 93 mg/dL (ref 70–99)
Potassium: 4 mmol/L (ref 3.5–5.2)
Sodium: 139 mmol/L (ref 134–144)
Total Protein: 7.5 g/dL (ref 6.0–8.5)
eGFR: 92 mL/min/1.73 (ref >59)

## 2024-03-23 ENCOUNTER — Ambulatory Visit: Payer: Self-pay | Admitting: Physician Assistant

## 2024-03-23 ENCOUNTER — Encounter: Payer: Self-pay | Admitting: Family Medicine

## 2024-03-23 ENCOUNTER — Encounter: Payer: Self-pay | Admitting: Physician Assistant

## 2024-03-23 DIAGNOSIS — F341 Dysthymic disorder: Secondary | ICD-10-CM | POA: Diagnosis not present

## 2024-03-24 ENCOUNTER — Other Ambulatory Visit: Payer: Self-pay | Admitting: *Deleted

## 2024-03-24 ENCOUNTER — Ambulatory Visit (INDEPENDENT_AMBULATORY_CARE_PROVIDER_SITE_OTHER): Admitting: Clinical

## 2024-03-24 DIAGNOSIS — J45909 Unspecified asthma, uncomplicated: Secondary | ICD-10-CM

## 2024-03-24 DIAGNOSIS — F3132 Bipolar disorder, current episode depressed, moderate: Secondary | ICD-10-CM

## 2024-03-24 DIAGNOSIS — F411 Generalized anxiety disorder: Secondary | ICD-10-CM

## 2024-03-24 MED ORDER — BUDESONIDE-FORMOTEROL FUMARATE 160-4.5 MCG/ACT IN AERO: 2.0000 | INHALATION_SPRAY | Freq: Two times a day (BID) | RESPIRATORY_TRACT | 3 refills | Status: AC

## 2024-03-24 NOTE — Progress Notes (Signed)
 Virtual Visit via Video Note   I connected with Bailey Hooper on 03/24/24 at  1:00 PM EDT by a video enabled telemedicine application and verified that I am speaking with the correct person using two identifiers.   Location: Patient: home Provider: office   I discussed the limitations of evaluation and management by telemedicine and the availability of in person appointments. The patient expressed understanding and agreed to proceed.   THERAPIST PROGRESS NOTE   Session Time: 1:00 PM-1:30 PM   Participation Level: Active   Behavioral Response: CasualAlertAnxious   Type of Therapy: Individual Therapy   Treatment Goals addressed: Coping   Interventions: CBT   Summary: Bailey Hooper is a 58 y.o. female who presents with Bipolar Disorder./ GAD. The OPT therapist worked with the patient for her scheduled OPT session. The OPT therapist utilized Motivational Interviewing to assist in creating therapeutic repore. The patient in the session was engaged and work in collaboration giving feedback about her triggers and symptoms over the past few weeks. The patient spoke about her her depression and difficulty with fatigue, irritability, sleep cycle, and eating habits. The patient spoke about reapplying for disability. The patient spoke about interactions with her family which has not improved over the past few weeks.The OPT therapist utilized Cognitive Behavioral Therapy through cognitive restructuring as well as worked with the patient on coping strategies for the Winter. The patient spoke about her decision to limit her interactions with her family as this has been a trigger for her MH. The patient spoke about spending Thanksgiving by herself. The patient spoke about ongoing work to manage her sleep cycle with C-PAP machine..   Suicidal/Homicidal: Nowithout intent/plan   Therapist Response: The OPT therapist worked with the patient for the patients scheduled session. The patient was engaged in her  session and gave feedback in relation to triggers, symptoms, and behavior responses over the past few weeks. The patient spoke about her interactions with family not improving and her decision to spend Thanksgiving holiday by herself..The OPT therapist worked with the patient utilizing an in session Cognitive Behavioral Therapy exercise. The patient was responsive in the session and verbalized,  I have been using the C-PAP machine and that has been helping my sleep. The OPT therapist worked with the patient overviewing basic care needs including eating, sleeping, exercise, and hygenie. The patient identified her need to focus on herself and realization she cannot work outside of what is in her control. The OPT therapist continued to work with the patient on mindfulness DBT and her work/life balance while promoting the patient continuing to follow the directives of her health professionals. The OPT therapist worked with the patient on healthy family relationships and the importance of not allowing herself to be pulled into family conflicts and inner family triangulation. The patient who acknowledged her need to manage her relationship with her family even if this means limited to no interaction while she works to stabilize her health. The OPT therapist worked with the patient on limiting her triggering and replacing her negative coping with positive coping. The OPT therapist will continue treatment work with the patient in her next scheduled session.   Plan: Return again in 3 weeks   Diagnosis:      Axis I: Bipolar Disorder/ GAD                             Axis II: No diagnosis   Collaboration of Care: No  additional collaboration of care for this session.    Patient/Guardian was advised Release of Information must be obtained prior to any record release in order to collaborate their care with an outside provider. Patient/Guardian was advised if they have not already done so to contact the registration  department to sign all necessary forms in order for us  to release information regarding their care.    Consent: Patient/Guardian gives verbal consent for treatment and assignment of benefits for services provided during this visit. Patient/Guardian expressed understanding and agreed to proceed      I discussed the assessment and treatment plan with the patient. The patient was provided an opportunity to ask questions and all were answered. The patient agreed with the plan and demonstrated an understanding of the instructions.   The patient was advised to call back or seek an in-person evaluation if the symptoms worsen or if the condition fails to improve as anticipated.   I provided 30  minutes of non-face-to-face time during this encounter.   Jerel ONEIDA Pepper, LCSW   03/24/2024

## 2024-04-14 ENCOUNTER — Encounter: Payer: Self-pay | Admitting: Physician Assistant

## 2024-04-20 ENCOUNTER — Ambulatory Visit: Payer: Self-pay | Admitting: Family Medicine

## 2024-04-20 NOTE — Patient Instructions (Incomplete)
 It was wonderful to see you today.  Please bring ALL of your medications with you to every visit.   Today we talked about:  Diabetes Your diabetes is not controlled Your goal is to have an A1c < 7 Medicine Changes: none Homework: work on a healthier diet and increasing your physical activity.    Come back to see us  in: 4 weeks   Thank you for choosing [facility].   Please call [phone] with any questions about today's appointment.  Please arrive at least 15 minutes prior to your scheduled appointments.   If you had blood work today, I will send you a MyChart message or a letter if results are normal. Otherwise, I will give you a call.   If you had a referral placed, they will call you to set up an appointment. Please give us  a call if you don't hear back in the next 2 weeks.   If you need additional refills before your next appointment, please call your pharmacy first.   Do you need your medications delivered to your home?   Well send your prescription to the [facility] for delivery.          Address: [address] [place], [place], [place] [address]          Phone: [phone]  Please call the [facility] to speak with a pharmacist and set up your home medication delivery. If you have any questions, feel free to contact us  -- were happy to help!  Other [facility] that offer affordable prices on both prescriptions and over-the-counter items, as well as convenient services like vaccinations, are  [facility], at [facility]         Address:  [address], [place], [place] [address]         Phone: [phone]  [facility], located in the [facility]        Address: [address], [place], [place] [address]        Phone: [phone]  [facility], at [facility]       Address: [address], [place], [place]  [address]       Phone: [phone]  [facility], at [facility]       Address: [address], [place], [place] [address]       Phone: [phone]  You should follow up in our clinic in No follow-ups on file.  Gloriann Ogren, MD Family Medicine

## 2024-04-20 NOTE — Progress Notes (Unsigned)
" ° ° °  SUBJECTIVE:   CHIEF COMPLAINT / HPI:   ***  PERTINENT  PMH / PSH: ***  OBJECTIVE:   LMP 02/12/2017 Comment: not sexually active  ***  ASSESSMENT/PLAN:   Assessment & Plan Screening for colon cancer  Encounter for screening mammogram for malignant neoplasm of breast  Type 2 diabetes mellitus with hyperglycemia, without long-term current use of insulin (HCC) Last A1C 6.7%. Patient has been prediabetic for many years. Lipid panel within normal limits. eGFR of 92. Last saw ophthalmologist 09/2023 without signs of diabetic retinopathy.  - UACR today - discussed managing with diet alone vs starting medication. Patient opted to start with managing via diet. Provided information regarding heart healthy diets.       Gloriann Ogren, MD [facility] "

## 2024-04-21 ENCOUNTER — Ambulatory Visit

## 2024-04-21 VITALS — BP 136/78 | HR 86 | Ht 66.0 in | Wt 188.4 lb

## 2024-04-21 DIAGNOSIS — E119 Type 2 diabetes mellitus without complications: Secondary | ICD-10-CM

## 2024-04-21 NOTE — Progress Notes (Signed)
" ° ° °  SUBJECTIVE:   CHIEF COMPLAINT / HPI:   DM2 Lab Results  Component Value Date   HGBA1C 6.7 (H) 03/20/2024  - Got labs with cardiology, they encouraged her to FU with PCP for A1c - Not currently on medication - Says she ate too much stuff during the holidays. She has returned to her prior diet. Drinks one soda per day. Otherwise no sugar sweetened beverages. Does admit to heavy carb intake but also balanced with veggies. She drinks smoothies with mostly veggies.  - review of medications shows remeron  for sleep, but pt states she does not take this often. Otherwise no medications that could be affecting blood sugar  PERTINENT  PMH / PSH: HTN, HLD, OSA, CAD  OBJECTIVE:   BP (!) 146/94   Pulse 86   Ht 5' 6 (1.676 m)   Wt 188 lb 6.4 oz (85.5 kg)   LMP 02/12/2017 Comment: not sexually active  SpO2 100%   BMI 30.41 kg/m   Physical Exam General: Alert, conversant, cooperative. No acute distress.  HEENT: PERRL. EOMI. MMM.  Cardiovascular: RRR Respiratory: Normal work of breathing. Skin: Warm. Dry. No rashes. No icterus.  Neurologic: No focal deficits. Moving all extremities. Psychiatric: Cooperative. Appropriate mood. Appropriate affect.   ASSESSMENT/PLAN:   Assessment & Plan Controlled type 2 diabetes mellitus without complication, without long-term current use of insulin (HCC) - discussed A1c now in diabetes range. Discussed importance of glucose control. Discussed metformin (she is not interested due to side effects she saw in relatives), glp1 (not interested injections or weight loss). Could consider rebylsus as there is minimal weight loss or jardiance. She is interested in changing her diet. Will avoid sugar sweetened beverages, try splenda. Discussed carbs in foods. Repeat A1c in 2 months.      Milda LITTIE Deed, MD [facility] "

## 2024-04-21 NOTE — Patient Instructions (Addendum)
 It was good to see you today.   Please bring ALL of your medications with you to every visit.    Today we talked about: Diabetes. We discussed metformin, GLP1s, Rebylsus, and insulin. Discussed medication vs diet changes and you elected for diet changes.      Thank you for choosing [facility]. Please refer to your mychart for specifics regarding today's visit or future appointments.

## 2024-04-27 ENCOUNTER — Ambulatory Visit (HOSPITAL_COMMUNITY): Admitting: Clinical

## 2024-04-27 DIAGNOSIS — F3132 Bipolar disorder, current episode depressed, moderate: Secondary | ICD-10-CM

## 2024-04-27 DIAGNOSIS — F411 Generalized anxiety disorder: Secondary | ICD-10-CM | POA: Diagnosis not present

## 2024-04-27 DIAGNOSIS — F319 Bipolar disorder, unspecified: Secondary | ICD-10-CM

## 2024-04-27 NOTE — Progress Notes (Signed)
 Virtual Visit via Video Note   I connected with Bailey Hooper on 04/27/24 at  1:00 PM EDT by a video enabled telemedicine application and verified that I am speaking with the correct person using two identifiers.   Location: Patient: home Provider: office   I discussed the limitations of evaluation and management by telemedicine and the availability of in person appointments. The patient expressed understanding and agreed to proceed.   THERAPIST PROGRESS NOTE   Session Time: 1:00 PM-1:45 PM   Participation Level: Active   Behavioral Response: CasualAlertAnxious   Type of Therapy: Individual Therapy   Treatment Goals addressed: Coping   Interventions: CBT   Summary: Bailey Hooper is a 59 y.o. female who presents with Bipolar Disorder./ GAD. The OPT therapist worked with the patient for her scheduled OPT session. The OPT therapist utilized Motivational Interviewing to assist in creating therapeutic repore. The patient in the session was engaged and work in collaboration giving feedback about her triggers and symptoms over the past few weeks through the holidays seeing her daughter and granddaughter for Christmas. The patient spoke about her her MH symptoms and taking time through the end of the year to recharge and have less external stressors. The patient spoke about interactions with her family which has not improved over the past few weeks.The OPT therapist utilized Cognitive Behavioral Therapy through cognitive restructuring as well as worked with the patient on coping strategies for the Winter. The patient spoke about her decision to limit her interactions with certain family members as this has been a trigger for her MH.   Suicidal/Homicidal: Nowithout intent/plan   Therapist Response: The OPT therapist worked with the patient for the patients scheduled session. The patient was engaged in her session and gave feedback in relation to triggers, symptoms, and behavior responses over the past  few weeks. The patient spoke about her interactions with family and her decision to spend the holidays with just her daughter and grand-daughter. The patients daughter who recently moved to [place]  and now wanting to move again asking the patient to help her find another place to live adding stress for the patient who is considering allowing the grandchildren to come live with her in February. The patient noted she is ok with her grandchildren living with her, however, is not open to allowing her daughter living with her due to their history and this previously creating conflict. The OPT therapist worked with the patient utilizing an in session Cognitive Behavioral Therapy exercise. The patient was responsive in the session and verbalized,  My daughter has burned her bridges with everyone else and now she wants the grandchildren to move in with me, but she wants to come to. The OPT therapist worked with the patient overviewing basic care needs including eating, sleeping, exercise, and hygenie. The patient identified her need to focus on herself and realization she cannot work outside of what is in her control. The OPT therapist continued to work with the patient on mindfulness DBT and her work/life balance while promoting the patient continuing to follow the directives of her health professionals. The OPT therapist worked with the patient on healthy family relationships and the importance of not allowing herself to be pulled into family conflicts and inner family triangulation. The patient who acknowledged her need to stabilize her health, spoke about her concern that if she allows the daughter to move in she will overstay her welcome.The OPT therapist worked with the patient on limiting her triggering and replacing her negative  coping with positive coping. The OPT therapist continued to reiterate the importance outside of her daughter being in crisis not taking on more than she can handle as family conflict and  overextending to help family has been historically a trigger for her own MH symptoms.The OPT therapist will continue treatment work with the patient in her next scheduled session.   Plan: Return again in 3 weeks   Diagnosis:      Axis I: Bipolar Disorder/ GAD                             Axis II: No diagnosis   Collaboration of Care: No additional collaboration of care for this session.    Patient/Guardian was advised Release of Information must be obtained prior to any record release in order to collaborate their care with an outside provider. Patient/Guardian was advised if they have not already done so to contact the registration department to sign all necessary forms in order for us  to release information regarding their care.    Consent: Patient/Guardian gives verbal consent for treatment and assignment of benefits for services provided during this visit. Patient/Guardian expressed understanding and agreed to proceed      I discussed the assessment and treatment plan with the patient. The patient was provided an opportunity to ask questions and all were answered. The patient agreed with the plan and demonstrated an understanding of the instructions.   The patient was advised to call back or seek an in-person evaluation if the symptoms worsen or if the condition fails to improve as anticipated.   I provided 45 minutes of non-face-to-face time during this encounter.   Bailey ONEIDA Pepper, LCSW   04/27/2024

## 2024-05-01 NOTE — Progress Notes (Unsigned)
 "  59 y.o. [name] female here for annual exam. Divorced. PCP: Bailey Suzann HERO, MD   Patient's last menstrual period was 02/12/2017.    She reports ***. Urine sample provided: ***  Abnormal bleeding: *** Pelvic discharge or pain: *** Breast mass, nipple discharge or skin changes : ***  Sexually active: *** Birth control: *** Last PAP:     Component Value Date/Time   DIAGPAP  08/25/2021 1446    - Negative for intraepithelial lesion or malignancy (NILM)   DIAGPAP  08/19/2020 1617    - Negative for intraepithelial lesion or malignancy (NILM)   ADEQPAP  08/25/2021 1446    Satisfactory for evaluation; transformation zone component ABSENT.   ADEQPAP  08/19/2020 1617    Satisfactory for evaluation; transformation zone component ABSENT.   Last mammogram: 04/20/24 Pending; 05/18/22 Birads 1, Density B Last colonoscopy: 06/15/20 yearly  Exercising: *** Smoker: ***  Flowsheet Row Office Visit from 04/21/2024 in [facility] [place]. [facility]  PHQ-2 Total Score 3    Flowsheet Row Office Visit from 04/21/2024 in [facility] - [facility] [place]. [facility]  PHQ-9 Total Score 16     GYN HISTORY: ***  OB History  Gravida Para Term Preterm AB Living  3 1 1  0 2 1  SAB IAB Ectopic Multiple Live Births  2        # Outcome Date GA Lbr Len/2nd Weight Sex Type Anes PTL Lv  3 Term           2 SAB           1 SAB            Past Medical History:  Diagnosis Date   Allergy    Anxiety    Arthritis    Asthma    Depression    Fibroids    GERD (gastroesophageal reflux disease)    Hiatal hernia    High cholesterol    Hypertension    IBS (irritable bowel syndrome)    Migraines    Non-alcoholic fatty liver disease    Prediabetes    Sleep apnea    cpap   Tobacco use    Vertigo    Past Surgical History:  Procedure Laterality Date   CESAREAN SECTION     COLONOSCOPY     FOOT SURGERY     Left foot   LEFT HEART CATH  AND CORONARY ANGIOGRAPHY N/A 08/15/2023   Procedure: LEFT HEART CATH AND CORONARY ANGIOGRAPHY;  Surgeon: Mady Bruckner, MD;  Location: [facility] INVASIVE [facility];  Service: Cardiovascular;  Laterality: N/A;   Medications Ordered Prior to Encounter[1] Social History   Socioeconomic History   Marital status: Divorced    Spouse name: Not on file   Number of children: 1   Years of education: 12   Highest education level: High school graduate  Occupational History   Not on file  Tobacco Use   Smoking status: Some Days    Current packs/day: 0.00    Average packs/day: 0.3 packs/day for 34.7 years (8.7 ttl pk-yrs)    Types: Cigarettes    Start date: 04/09/1986    Last attempt to quit: 12/08/2020    Years since quitting: 3.3   Smokeless tobacco: Never   Tobacco comments:    01/01/2024 pATIENHT SMOKES ABOUT 3  WHEN SMOKING    3-4 A Day. Taking chantix .  Vaping Use   Vaping status:  Never Used  Substance and Sexual Activity   Alcohol use: Not Currently   Drug use: No   Sexual activity: Not Currently    Partners: Male    Birth control/protection: Post-menopausal  Other Topics Concern   Not on file  Social History Narrative   Lives with: Alone (grandaughters visit throughout the week, ages 41 and 59 y/o)   Works: Unemployed in spring 2020 due to COVID-19   Married: divorced      Social Drivers of Health   Tobacco Use: High Risk (04/21/2024)   Patient History    Smoking Tobacco Use: Some Days    Smokeless Tobacco Use: Never    Passive Exposure: Not on file  Financial Resource Strain: Not on file  Food Insecurity: Low Risk (04/29/2024)   Received from [facility]   Epic    Within the past 12 months, you worried that your food would run out before you got money to buy more: Never true    Within the past 12 months, the food you bought just didn't last and you didn't have money to get more. : Never true  Transportation Needs: No Transportation Needs (04/29/2024)   Received from [employer]    In the past 12 months, has lack of reliable transportation kept you from medical appointments, meetings, work or from getting things needed for daily living? : No  Physical Activity: Not on file  Stress: Not on file  Social Connections: Unknown (08/22/2021)   Received from [facility]   Social Network    Social Network: Not on file  Intimate Partner Violence: Unknown (07/14/2021)   Received from [facility]   HITS    Physically Hurt: Not on file    Insult or Talk Down To: Not on file    Threaten Physical Harm: Not on file    Scream or Curse: Not on file  Depression (PHQ2-9): High Risk (04/21/2024)   Depression (PHQ2-9)    PHQ-2 Score: 16  Alcohol Screen: Not on file  Housing: Low Risk (04/29/2024)   Received from [facility]   Epic    What is your living situation today?: I have a steady place to live    Think about the place you live. Do you have problems with any of the following? Choose all that apply:: None/None on this list  Utilities: Low Risk (04/29/2024)   Received from [facility]   Utilities    In the past 12 months has the electric, gas, oil, or water company threatened to shut off services in your home? : No  Health Literacy: Not on file   Family History  Problem Relation Age of Onset   Pulmonary embolism Mother    Hyperlipidemia Mother    Diabetes Mother    Depression Mother    Hypertension Mother    Other Mother        blood clot   Colon polyps Father    Colon cancer Father    Heart disease Father    Diabetes Father    Hypertension Father    Dementia Father    Venous thrombosis Sister        while on OCP (occurred later in life on COC)   Pulmonary embolism Sister    Heart disease Maternal Grandmother    Diabetes Maternal Grandmother    Colon cancer Other        Father   Cancer Other        Oral, uncle  Alcohol abuse Other        Father   Drug abuse Other        Father   Osteoporosis Other    Depression Other     Hypertension Other    Diabetes Other    Heart disease Other        Grandmother   Allergies[2]   PE There were no vitals filed for this visit. There is no height or weight on file to calculate BMI.  Physical Exam    Assessment and Plan:        There are no diagnoses linked to this encounter. Bailey FORBES Pa, CMA      [1]  Current Outpatient Medications on File Prior to Visit  Medication Sig Dispense Refill   albuterol  (PROVENTIL ) (2.5 MG/3ML) 0.083% nebulizer solution Take 3 mLs (2.5 mg total) by nebulization every 6 (six) hours as needed for wheezing or shortness of breath. 150 mL 0   albuterol  (VENTOLIN  HFA) 108 (90 Base) MCG/ACT inhaler Inhale 2 puffs into the lungs every 6 (six) hours as needed for wheezing. 18 g 2   aspirin  EC 81 MG tablet Take 1 tablet (81 mg total) by mouth daily. Swallow whole.     atorvastatin  (LIPITOR) 80 MG tablet Take 1 tablet (80 mg total) by mouth daily. 90 tablet 3   azelastine  (OPTIVAR ) 0.05 % ophthalmic solution Place 1 drop into both eyes 2 (two) times daily. (Patient taking differently: Place 1 drop into both eyes daily as needed (allergies).) 6 mL 12   budesonide -formoterol  (SYMBICORT ) 160-4.5 MCG/ACT inhaler Inhale 2 puffs into the lungs 2 (two) times daily. 10.2 g 3   ciclopirox  (PENLAC ) 8 % solution Apply topically at bedtime. Apply over nail and surrounding skin. Apply daily over previous coat. After seven (7) days, may remove with alcohol and continue cycle. 6.6 mL 0   clobetasol (TEMOVATE) 0.05 % external solution Apply 1 Application topically 2 (two) times daily.     clonazePAM  (KLONOPIN ) 0.5 MG tablet Take 1 tablet (0.5 mg total) by mouth daily as needed. 30 tablet 0   cyclobenzaprine  (FLEXERIL ) 10 MG tablet Take 1 tablet (10 mg total) by mouth 3 (three) times daily as needed for muscle spasms. 30 tablet 0   ELIDEL  1 % cream Apply topically daily. Use on affected areas. (Patient taking differently: Apply 1 Application topically daily as  needed (irritation). Use on affected areas.) 60 g 0   escitalopram (LEXAPRO) 10 MG tablet Take 10 mg by mouth daily.     estradiol  (VIVELLE -DOT) 0.05 MG/24HR patch Place 1 patch onto the skin 2 (two) times a week.     famotidine  (PEPCID ) 20 MG tablet Take 20 mg by mouth 2 (two) times daily.     Fluocinolone  Acetonide Scalp (DERMA-SMOOTHE /FS SCALP) 0.01 % OIL Apply 1 Application topically as directed. 118.28 mL 3   fluocinonide  cream (LIDEX ) 0.05 % Apply 1 Application topically as needed.     fluticasone  (FLONASE ) 50 MCG/ACT nasal spray Place 2 sprays into both nostrils as needed.     gabapentin  (NEURONTIN ) 100 MG capsule Take 100 mg by mouth 3 (three) times daily as needed (pain).     hydrOXYzine  (ATARAX ) 10 MG tablet Take 10 mg by mouth 3 (three) times daily as needed for itching.     isosorbide  mononitrate (IMDUR ) 60 MG 24 hr tablet Take 1.5 tablets (90 mg total) by mouth daily. 135 tablet 3   lansoprazole  (PREVACID ) 30 MG capsule Take 1 capsule (30 mg total)  by mouth daily at 12 noon. 90 capsule 3   loratadine  (CLARITIN ) 10 MG tablet Take 1 tablet (10 mg total) by mouth daily. (Patient taking differently: Take 10 mg by mouth daily as needed for allergies.) 30 tablet 11   Maca Root 500 MG CAPS Take 500 mg by mouth daily.     methocarbamol (ROBAXIN) 500 MG tablet Take 500 mg by mouth every 6 (six) hours as needed for muscle spasms.     metoprolol  succinate (TOPROL -XL) 50 MG 24 hr tablet Take 1 tablet (50 mg total) by mouth at bedtime. Take with or immediately following a meal. 90 tablet 3   mirtazapine  (REMERON ) 45 MG tablet Take 1 tablet (45 mg total) by mouth at bedtime. (Patient taking differently: Take 22.5 mg by mouth at bedtime.) 30 tablet 1   mometasone  (NASONEX ) 50 MCG/ACT nasal spray 2 sprays each nostril daily (Patient taking differently: Place 2 sprays into the nose daily as needed (allergies).) 17 g 12   montelukast  (SINGULAIR ) 10 MG tablet Take 10 mg by mouth at bedtime.     Multiple  Vitamin (MULTIVITAMIN) capsule Take 1 capsule by mouth daily.     mupirocin  ointment (BACTROBAN ) 2 % Apply 1 Application topically as needed.     nitroGLYCERIN  (NITROSTAT ) 0.4 MG SL tablet Place 1 tablet (0.4 mg total) under the tongue every 5 (five) minutes as needed for chest pain. 25 tablet 11   nystatin  (MYCOSTATIN /NYSTOP ) powder Apply 1 Application topically 2 (two) times daily. In skin fold (Patient taking differently: Apply 1 Application topically 2 (two) times daily as needed (irritation in skin fold).) 15 g 2   Olmesartan -amLODIPine -HCTZ 40-10-25 MG TABS Take 1 tablet by mouth daily. 30 tablet 5   ondansetron  (ZOFRAN ) 4 MG tablet Take 4 mg by mouth every 8 (eight) hours as needed for vomiting or nausea.     ondansetron  (ZOFRAN -ODT) 4 MG disintegrating tablet DISSOLVE 1 TABLET UNDER THE TONGUE EVERY 6 HOURS AS NEEDED FOR NAUSEA AND VOMITING. 30 tablet 0   pantoprazole  (PROTONIX ) 40 MG tablet Take 40 mg by mouth daily.     permethrin (ELIMITE) 5 % cream Apply 1 Application topically as needed.     QUEtiapine  (SEROQUEL ) 50 MG tablet Take 50 mg by mouth at bedtime.     SUMAtriptan  (IMITREX ) 50 MG tablet May repeat in 2 hours if headache persists or recurs with max of 100 mg in 24 hours. 9 tablet 11   traMADol  (ULTRAM ) 50 MG tablet Take 50 mg by mouth as needed.     traZODone  (DESYREL ) 50 MG tablet Take 0.5-1 tablets (25-50 mg total) by mouth at bedtime as needed for sleep. 30 tablet 1   triamcinolone  (KENALOG ) 0.025 % ointment Apply 1 Application topically 2 (two) times daily. 30 g 0   varenicline  (CHANTIX ) 0.5 MG tablet Take 1 tablet (0.5 mg total) by mouth 2 (two) times daily. Take one tablet once daily with food for 7 days THEN increase to one tablet twice daily with food 60 tablet 5   No current facility-administered medications on file prior to visit.  [2]  Allergies Allergen Reactions   Lisinopril  Palpitations and Cough    Per pt report   Lamictal  [Lamotrigine ] Hives    Denies  airway involvement    Penicillins Rash    Vesicles, denies SOB or breathing issues   Erythromycin Nausea And Vomiting   "

## 2024-05-04 ENCOUNTER — Ambulatory Visit: Admitting: Obstetrics and Gynecology

## 2024-05-04 ENCOUNTER — Ambulatory Visit: Admitting: Family Medicine

## 2024-05-08 ENCOUNTER — Ambulatory Visit: Admitting: Podiatry

## 2024-05-08 DIAGNOSIS — L600 Ingrowing nail: Secondary | ICD-10-CM | POA: Diagnosis not present

## 2024-05-08 DIAGNOSIS — M2141 Flat foot [pes planus] (acquired), right foot: Secondary | ICD-10-CM | POA: Diagnosis not present

## 2024-05-08 DIAGNOSIS — M79674 Pain in right toe(s): Secondary | ICD-10-CM | POA: Diagnosis not present

## 2024-05-08 DIAGNOSIS — M79675 Pain in left toe(s): Secondary | ICD-10-CM

## 2024-05-08 DIAGNOSIS — M2142 Flat foot [pes planus] (acquired), left foot: Secondary | ICD-10-CM

## 2024-05-08 DIAGNOSIS — B351 Tinea unguium: Secondary | ICD-10-CM | POA: Diagnosis not present

## 2024-05-08 NOTE — Progress Notes (Signed)
"  °  Subjective:  Patient ID: Bailey Hooper, female    DOB: 1966-02-17,  MRN: [id number]  Chief Complaint  Patient presents with   RFC    Last A1c: 6.7. no anticoag. Has callous on plantar aspect of left foot. Needs nails trimmed as well.       59 year old female, with a history of foot surgery, presents with a painful callus on the foot that has been causing discomfort as well as for ingrown toenails to her big toes is not to some of the lesser digit nails as well.  She says her nails are thick and discolored she has difficulty trimming them.   Objective:    Physical Exam         General: AAO x3, NAD  Dermatological: Nails appear to be hypertrophic, dystrophic yellow discoloration.  There is tenderness palpation all the nails 1-5 bilaterally.  There is incurvation of multiple toenails but there is no edema, erythema or signs of infection.  Callus formation noted submetatarsal 2 as well as on the medial hallux.  These appear to be much improved compared to what they have been previously.  Vascular: Dorsalis Pedis artery and Posterior Tibial artery pedal pulses are 2/4 bilateral with immedate capillary fill time.  There is no pain with calf compression, swelling, warmth, erythema.   Neruologic: Grossly intact via light touch bilateral.   Musculoskeletal: Flatfoot is present.  Prominent metatarsal head plantarly.    No area pinpoint tenderness.  Gait: Unassisted, Nonantalgic.         Assessment:   Pes planovalgus; symptomatic onychomycosis  Plan:    Ingrown toenails - We discussed partial nail avulsion again today.  Discussed the procedure as well as postoperative course.  She will consider this in the future.  Symptomatic onychomycosis - Sharply debrided nails x 10 without complications or bleeding  Hyperkeratotic lesions, pes planovalgus - Serpe debrided without any complications or bleeding as a courtesy today as they were more minimal.  Continue moisturizer, offloading.   Likely result of foot structure, walking.  Discussed supportive shoe gear as well as inserts.   [name] DPM  "

## 2024-05-14 ENCOUNTER — Telehealth: Payer: Self-pay | Admitting: Physician Assistant

## 2024-05-14 NOTE — Telephone Encounter (Signed)
 Patient stated her apartment manager will not accept the letter she brought in to them and will be faxing the documents back to T. [place], [place].  Patient requests T. Lucien complete the paperwork and  fax it back directly to her Office Manager,  Attention:  [place] or [place].

## 2024-05-25 ENCOUNTER — Ambulatory Visit: Admitting: Obstetrics and Gynecology

## 2024-06-01 ENCOUNTER — Ambulatory Visit (HOSPITAL_COMMUNITY): Admitting: Clinical

## 2024-06-08 ENCOUNTER — Ambulatory Visit

## 2024-06-16 ENCOUNTER — Ambulatory Visit: Admitting: Dermatology

## 2024-08-07 ENCOUNTER — Ambulatory Visit: Admitting: Podiatry
